# Patient Record
Sex: Female | Born: 1940 | Race: White | Hispanic: No | Marital: Married | Smoking: Former smoker
Health system: Southern US, Community
[De-identification: ages and names within clinical notes are randomized; demographics above are authoritative.]

## PROBLEM LIST (undated history)

## (undated) DIAGNOSIS — M109 Gout, unspecified: Secondary | ICD-10-CM

## (undated) DIAGNOSIS — IMO0001 Reserved for inherently not codable concepts without codable children: Secondary | ICD-10-CM

## (undated) DIAGNOSIS — I10 Essential (primary) hypertension: Secondary | ICD-10-CM

## (undated) DIAGNOSIS — M5430 Sciatica, unspecified side: Secondary | ICD-10-CM

## (undated) DIAGNOSIS — E1165 Type 2 diabetes mellitus with hyperglycemia: Secondary | ICD-10-CM

## (undated) DIAGNOSIS — Z0000 Encounter for general adult medical examination without abnormal findings: Secondary | ICD-10-CM

## (undated) DIAGNOSIS — M25519 Pain in unspecified shoulder: Secondary | ICD-10-CM

## (undated) DIAGNOSIS — M5126 Other intervertebral disc displacement, lumbar region: Secondary | ICD-10-CM

## (undated) DIAGNOSIS — I6529 Occlusion and stenosis of unspecified carotid artery: Secondary | ICD-10-CM

## (undated) DIAGNOSIS — L259 Unspecified contact dermatitis, unspecified cause: Secondary | ICD-10-CM

## (undated) DIAGNOSIS — I872 Venous insufficiency (chronic) (peripheral): Secondary | ICD-10-CM

## (undated) DIAGNOSIS — E785 Hyperlipidemia, unspecified: Secondary | ICD-10-CM

## (undated) DIAGNOSIS — R413 Other amnesia: Secondary | ICD-10-CM

## (undated) DIAGNOSIS — Z83518 Family history of other specified eye disorder: Secondary | ICD-10-CM

## (undated) DIAGNOSIS — M79609 Pain in unspecified limb: Secondary | ICD-10-CM

## (undated) HISTORY — DX: Essential (primary) hypertension: I10

## (undated) HISTORY — DX: Venous insufficiency (chronic) (peripheral): I87.2

## (undated) HISTORY — DX: Type 2 diabetes mellitus with hyperglycemia: E11.65

## (undated) HISTORY — DX: Sciatica, unspecified side: M54.30

## (undated) HISTORY — DX: Unspecified contact dermatitis, unspecified cause: L25.9

## (undated) HISTORY — DX: Other intervertebral disc displacement, lumbar region: M51.26

## (undated) HISTORY — DX: Pain in unspecified limb: M79.609

## (undated) HISTORY — PX: TUBAL LIGATION: SHX77

## (undated) HISTORY — DX: Gout, unspecified: M10.9

## (undated) HISTORY — DX: Reserved for inherently not codable concepts without codable children: IMO0001

## (undated) HISTORY — DX: Hyperlipidemia, unspecified: E78.5

## (undated) HISTORY — DX: Occlusion and stenosis of unspecified carotid artery: I65.29

## (undated) HISTORY — DX: Encounter for general adult medical examination without abnormal findings: Z00.00

## (undated) HISTORY — DX: Other amnesia: R41.3

## (undated) HISTORY — PX: LAPAROSCOPIC APPENDECTOMY: SUR753

## (undated) HISTORY — DX: Pain in unspecified shoulder: M25.519

---

## 1998-04-26 ENCOUNTER — Ambulatory Visit (HOSPITAL_COMMUNITY): Admission: RE | Admit: 1998-04-26 | Discharge: 1998-04-26 | Payer: Self-pay | Admitting: Internal Medicine

## 1998-04-26 ENCOUNTER — Encounter: Payer: Self-pay | Admitting: Internal Medicine

## 2000-07-06 ENCOUNTER — Other Ambulatory Visit: Admission: RE | Admit: 2000-07-06 | Discharge: 2000-07-06 | Payer: Self-pay | Admitting: Obstetrics and Gynecology

## 2003-03-19 ENCOUNTER — Ambulatory Visit (HOSPITAL_COMMUNITY): Admission: RE | Admit: 2003-03-19 | Discharge: 2003-03-19 | Payer: Self-pay | Admitting: Internal Medicine

## 2004-09-22 ENCOUNTER — Ambulatory Visit: Payer: Self-pay | Admitting: Internal Medicine

## 2004-10-06 ENCOUNTER — Ambulatory Visit: Payer: Self-pay | Admitting: Internal Medicine

## 2004-10-08 ENCOUNTER — Ambulatory Visit: Payer: Self-pay | Admitting: Family Medicine

## 2004-10-13 ENCOUNTER — Ambulatory Visit: Payer: Self-pay | Admitting: Internal Medicine

## 2004-11-14 ENCOUNTER — Ambulatory Visit: Payer: Self-pay | Admitting: Internal Medicine

## 2004-12-28 ENCOUNTER — Ambulatory Visit: Payer: Self-pay | Admitting: Internal Medicine

## 2004-12-29 ENCOUNTER — Inpatient Hospital Stay (HOSPITAL_COMMUNITY): Admission: AD | Admit: 2004-12-29 | Discharge: 2005-01-02 | Payer: Self-pay | Admitting: Internal Medicine

## 2004-12-29 ENCOUNTER — Encounter: Admission: RE | Admit: 2004-12-29 | Discharge: 2004-12-29 | Payer: Self-pay | Admitting: Internal Medicine

## 2004-12-29 ENCOUNTER — Encounter (INDEPENDENT_AMBULATORY_CARE_PROVIDER_SITE_OTHER): Payer: Self-pay | Admitting: Specialist

## 2004-12-29 ENCOUNTER — Ambulatory Visit: Payer: Self-pay | Admitting: Internal Medicine

## 2005-02-01 ENCOUNTER — Ambulatory Visit: Payer: Self-pay | Admitting: Internal Medicine

## 2005-08-24 ENCOUNTER — Ambulatory Visit: Payer: Self-pay | Admitting: Internal Medicine

## 2005-11-07 ENCOUNTER — Ambulatory Visit: Payer: Self-pay | Admitting: Internal Medicine

## 2005-11-14 ENCOUNTER — Ambulatory Visit: Payer: Self-pay | Admitting: Internal Medicine

## 2005-11-30 ENCOUNTER — Ambulatory Visit: Payer: Self-pay

## 2006-03-16 ENCOUNTER — Ambulatory Visit: Payer: Self-pay | Admitting: Internal Medicine

## 2006-03-31 ENCOUNTER — Emergency Department (HOSPITAL_COMMUNITY): Admission: EM | Admit: 2006-03-31 | Discharge: 2006-03-31 | Payer: Self-pay | Admitting: Emergency Medicine

## 2006-04-07 ENCOUNTER — Ambulatory Visit: Payer: Self-pay | Admitting: Internal Medicine

## 2006-05-01 ENCOUNTER — Ambulatory Visit: Payer: Self-pay | Admitting: Internal Medicine

## 2006-09-13 ENCOUNTER — Ambulatory Visit: Payer: Self-pay | Admitting: Internal Medicine

## 2006-11-14 ENCOUNTER — Ambulatory Visit: Payer: Self-pay | Admitting: Internal Medicine

## 2006-11-14 LAB — CONVERTED CEMR LAB
Calcium: 9.5 mg/dL (ref 8.4–10.5)
GFR calc Af Amer: 107 mL/min
Glucose, Bld: 118 mg/dL — ABNORMAL HIGH (ref 70–99)
HDL: 30.8 mg/dL — ABNORMAL LOW (ref >39.0)
LDL Cholesterol: 106 mg/dL — ABNORMAL HIGH (ref 0–99)
Sodium: 145 meq/L (ref 135–145)
Total CHOL/HDL Ratio: 5.7
Triglycerides: 191 mg/dL — ABNORMAL HIGH (ref 0–149)

## 2006-12-04 ENCOUNTER — Ambulatory Visit: Payer: Self-pay

## 2007-02-16 ENCOUNTER — Encounter: Payer: Self-pay | Admitting: *Deleted

## 2007-02-16 DIAGNOSIS — Z9889 Other specified postprocedural states: Secondary | ICD-10-CM | POA: Insufficient documentation

## 2007-02-16 DIAGNOSIS — I10 Essential (primary) hypertension: Secondary | ICD-10-CM | POA: Insufficient documentation

## 2007-02-16 DIAGNOSIS — L259 Unspecified contact dermatitis, unspecified cause: Secondary | ICD-10-CM | POA: Insufficient documentation

## 2007-02-16 DIAGNOSIS — M109 Gout, unspecified: Secondary | ICD-10-CM | POA: Insufficient documentation

## 2007-02-16 DIAGNOSIS — E785 Hyperlipidemia, unspecified: Secondary | ICD-10-CM | POA: Insufficient documentation

## 2007-02-27 ENCOUNTER — Ambulatory Visit: Payer: Self-pay | Admitting: Internal Medicine

## 2007-05-22 ENCOUNTER — Emergency Department (HOSPITAL_COMMUNITY): Admission: EM | Admit: 2007-05-22 | Discharge: 2007-05-22 | Payer: Self-pay | Admitting: Family Medicine

## 2007-12-12 ENCOUNTER — Ambulatory Visit: Payer: Self-pay

## 2007-12-12 ENCOUNTER — Encounter: Payer: Self-pay | Admitting: Internal Medicine

## 2007-12-20 ENCOUNTER — Ambulatory Visit: Payer: Self-pay | Admitting: Internal Medicine

## 2007-12-20 DIAGNOSIS — T887XXA Unspecified adverse effect of drug or medicament, initial encounter: Secondary | ICD-10-CM | POA: Insufficient documentation

## 2007-12-20 DIAGNOSIS — M25519 Pain in unspecified shoulder: Secondary | ICD-10-CM | POA: Insufficient documentation

## 2007-12-20 LAB — CONVERTED CEMR LAB
BUN: 15 mg/dL (ref 6–23)
Basophils Absolute: 0.1 10*3/uL (ref 0.0–0.1)
Basophils Relative: 1.7 % (ref 0.0–3.0)
Calcium: 10 mg/dL (ref 8.4–10.5)
Chloride: 101 meq/L (ref 96–112)
Cholesterol: 155 mg/dL (ref 0–200)
Creatinine, Ser: 0.7 mg/dL (ref 0.4–1.2)
Glucose, Bld: 118 mg/dL — ABNORMAL HIGH (ref 70–99)
HCT: 44.9 % (ref 36.0–46.0)
HDL: 32.4 mg/dL — ABNORMAL LOW (ref >39.0)
Hemoglobin: 15.6 g/dL — ABNORMAL HIGH (ref 12.0–15.0)
LDL Cholesterol: 90 mg/dL (ref 0–99)
Lymphocytes Relative: 37.9 % (ref 12.0–46.0)
Monocytes Absolute: 0.8 10*3/uL (ref 0.1–1.0)
Neutrophils Relative %: 44.9 % (ref 43.0–77.0)
Platelets: 339 10*3/uL (ref 150–400)
Potassium: 4.1 meq/L (ref 3.5–5.1)
RBC: 5 M/uL (ref 3.87–5.11)
RDW: 12.4 % (ref 11.5–14.6)
Total CHOL/HDL Ratio: 4.8
Triglycerides: 163 mg/dL — ABNORMAL HIGH (ref 0–149)
VLDL: 33 mg/dL (ref 0–40)
WBC: 8 10*3/uL (ref 4.5–10.5)

## 2007-12-21 ENCOUNTER — Encounter: Payer: Self-pay | Admitting: Internal Medicine

## 2007-12-26 ENCOUNTER — Encounter: Payer: Self-pay | Admitting: Internal Medicine

## 2008-01-07 ENCOUNTER — Ambulatory Visit: Payer: Self-pay | Admitting: Internal Medicine

## 2008-01-07 ENCOUNTER — Encounter (INDEPENDENT_AMBULATORY_CARE_PROVIDER_SITE_OTHER): Payer: Self-pay

## 2008-01-21 ENCOUNTER — Ambulatory Visit: Payer: Self-pay | Admitting: Internal Medicine

## 2008-02-25 ENCOUNTER — Encounter: Payer: Self-pay | Admitting: Internal Medicine

## 2008-05-04 ENCOUNTER — Ambulatory Visit: Payer: Self-pay | Admitting: Internal Medicine

## 2008-05-04 DIAGNOSIS — M79609 Pain in unspecified limb: Secondary | ICD-10-CM | POA: Insufficient documentation

## 2008-05-05 ENCOUNTER — Ambulatory Visit: Payer: Self-pay | Admitting: Internal Medicine

## 2008-05-06 ENCOUNTER — Telehealth: Payer: Self-pay | Admitting: Internal Medicine

## 2008-05-26 ENCOUNTER — Telehealth: Payer: Self-pay | Admitting: Internal Medicine

## 2008-06-16 ENCOUNTER — Encounter: Payer: Self-pay | Admitting: Internal Medicine

## 2008-06-17 ENCOUNTER — Ambulatory Visit: Payer: Self-pay | Admitting: Internal Medicine

## 2008-07-02 ENCOUNTER — Encounter: Admission: RE | Admit: 2008-07-02 | Discharge: 2008-07-02 | Payer: Self-pay | Admitting: Internal Medicine

## 2008-07-06 ENCOUNTER — Telehealth (INDEPENDENT_AMBULATORY_CARE_PROVIDER_SITE_OTHER): Payer: Self-pay | Admitting: *Deleted

## 2008-07-08 ENCOUNTER — Ambulatory Visit: Payer: Self-pay | Admitting: Internal Medicine

## 2008-07-08 DIAGNOSIS — M5430 Sciatica, unspecified side: Secondary | ICD-10-CM | POA: Insufficient documentation

## 2008-07-10 ENCOUNTER — Encounter: Payer: Self-pay | Admitting: Internal Medicine

## 2008-07-16 ENCOUNTER — Encounter: Payer: Self-pay | Admitting: Internal Medicine

## 2008-07-20 HISTORY — PX: LUMBAR LAMINECTOMY: SHX95

## 2008-07-29 ENCOUNTER — Inpatient Hospital Stay (HOSPITAL_COMMUNITY): Admission: RE | Admit: 2008-07-29 | Discharge: 2008-08-03 | Payer: Self-pay | Admitting: Neurosurgery

## 2008-07-30 ENCOUNTER — Ambulatory Visit: Payer: Self-pay | Admitting: Physical Medicine & Rehabilitation

## 2008-08-27 ENCOUNTER — Encounter: Payer: Self-pay | Admitting: Internal Medicine

## 2008-09-07 ENCOUNTER — Encounter: Admission: RE | Admit: 2008-09-07 | Discharge: 2008-10-20 | Payer: Self-pay | Admitting: Neurosurgery

## 2008-10-22 ENCOUNTER — Encounter: Payer: Self-pay | Admitting: Internal Medicine

## 2008-12-16 ENCOUNTER — Ambulatory Visit: Payer: Self-pay

## 2008-12-16 ENCOUNTER — Encounter: Payer: Self-pay | Admitting: Internal Medicine

## 2008-12-29 ENCOUNTER — Ambulatory Visit: Payer: Self-pay | Admitting: Internal Medicine

## 2008-12-29 DIAGNOSIS — M5126 Other intervertebral disc displacement, lumbar region: Secondary | ICD-10-CM | POA: Insufficient documentation

## 2008-12-29 LAB — CONVERTED CEMR LAB
Albumin: 3.8 g/dL (ref 3.5–5.2)
Alkaline Phosphatase: 86 units/L (ref 39–117)
Basophils Absolute: 0 10*3/uL (ref 0.0–0.1)
Bilirubin Urine: NEGATIVE
CO2: 32 meq/L (ref 19–32)
Chloride: 101 meq/L (ref 96–112)
Direct LDL: 99 mg/dL
Eosinophils Absolute: 0.3 10*3/uL (ref 0.0–0.7)
GFR calc non Af Amer: 105.3 mL/min (ref >60)
HDL: 32.1 mg/dL — ABNORMAL LOW (ref >39.00)
Hemoglobin: 13.8 g/dL (ref 12.0–15.0)
Ketones, ur: NEGATIVE mg/dL
Lymphs Abs: 2.9 10*3/uL (ref 0.7–4.0)
MCHC: 34.1 g/dL (ref 30.0–36.0)
MCV: 86 fL (ref 78.0–100.0)
Monocytes Absolute: 0.8 10*3/uL (ref 0.1–1.0)
Monocytes Relative: 9.7 % (ref 3.0–12.0)
Neutrophils Relative %: 49.4 % (ref 43.0–77.0)
Nitrite: NEGATIVE
RBC: 4.71 M/uL (ref 3.87–5.11)
RDW: 13.7 % (ref 11.5–14.6)
Sodium: 142 meq/L (ref 135–145)
Specific Gravity, Urine: 1.02 (ref 1.000–1.030)
Total Bilirubin: 1.3 mg/dL — ABNORMAL HIGH (ref 0.3–1.2)
Total CHOL/HDL Ratio: 5
Urine Glucose: 100 mg/dL
Urobilinogen, UA: 0.2 (ref 0.0–1.0)
WBC: 7.8 10*3/uL (ref 4.5–10.5)
pH: 6 (ref 5.0–8.0)

## 2009-01-11 ENCOUNTER — Telehealth: Payer: Self-pay | Admitting: Internal Medicine

## 2009-01-12 ENCOUNTER — Ambulatory Visit: Payer: Self-pay | Admitting: Internal Medicine

## 2009-01-18 ENCOUNTER — Telehealth: Payer: Self-pay | Admitting: Internal Medicine

## 2009-03-01 ENCOUNTER — Encounter: Payer: Self-pay | Admitting: Internal Medicine

## 2009-05-24 ENCOUNTER — Encounter: Payer: Self-pay | Admitting: Internal Medicine

## 2009-06-22 ENCOUNTER — Encounter: Payer: Self-pay | Admitting: Internal Medicine

## 2009-06-22 LAB — HM MAMMOGRAPHY: HM Mammogram: NORMAL

## 2009-06-28 ENCOUNTER — Telehealth (INDEPENDENT_AMBULATORY_CARE_PROVIDER_SITE_OTHER): Payer: Self-pay | Admitting: *Deleted

## 2009-07-02 ENCOUNTER — Telehealth: Payer: Self-pay | Admitting: Internal Medicine

## 2009-07-06 ENCOUNTER — Encounter: Payer: Self-pay | Admitting: Internal Medicine

## 2009-11-15 ENCOUNTER — Ambulatory Visit: Payer: Self-pay | Admitting: Internal Medicine

## 2009-11-15 DIAGNOSIS — I872 Venous insufficiency (chronic) (peripheral): Secondary | ICD-10-CM | POA: Insufficient documentation

## 2009-12-15 ENCOUNTER — Encounter: Payer: Self-pay | Admitting: Internal Medicine

## 2009-12-15 DIAGNOSIS — I6529 Occlusion and stenosis of unspecified carotid artery: Secondary | ICD-10-CM | POA: Insufficient documentation

## 2009-12-16 ENCOUNTER — Encounter: Payer: Self-pay | Admitting: Internal Medicine

## 2009-12-16 ENCOUNTER — Ambulatory Visit: Payer: Self-pay

## 2009-12-31 ENCOUNTER — Ambulatory Visit: Payer: Self-pay | Admitting: Internal Medicine

## 2009-12-31 ENCOUNTER — Encounter: Payer: Self-pay | Admitting: Internal Medicine

## 2009-12-31 LAB — CONVERTED CEMR LAB
Albumin: 3.9 g/dL (ref 3.5–5.2)
Alkaline Phosphatase: 67 units/L (ref 39–117)
BUN: 17 mg/dL (ref 6–23)
Bilirubin, Direct: 0.2 mg/dL (ref 0.0–0.3)
CO2: 33 meq/L — ABNORMAL HIGH (ref 19–32)
Creatinine, Ser: 0.7 mg/dL (ref 0.4–1.2)
Eosinophils Relative: 3.6 % (ref 0.0–5.0)
GFR calc non Af Amer: 92.44 mL/min (ref >60)
Glucose, Bld: 146 mg/dL — ABNORMAL HIGH (ref 70–99)
HCT: 41.5 % (ref 36.0–46.0)
Lymphocytes Relative: 38.9 % (ref 12.0–46.0)
MCHC: 35.1 g/dL (ref 30.0–36.0)
Magnesium: 2 mg/dL (ref 1.5–2.5)
Monocytes Absolute: 0.8 10*3/uL (ref 0.1–1.0)
Neutrophils Relative %: 48.1 % (ref 43.0–77.0)
Platelets: 320 10*3/uL (ref 150.0–400.0)
Potassium: 4.2 meq/L (ref 3.5–5.1)
Total Bilirubin: 1.1 mg/dL (ref 0.3–1.2)
Total CHOL/HDL Ratio: 5
Triglycerides: 289 mg/dL — ABNORMAL HIGH (ref 0.0–149.0)
VLDL: 57.8 mg/dL — ABNORMAL HIGH (ref 0.0–40.0)

## 2010-06-21 NOTE — Miscellaneous (Signed)
Summary: Bone Density update   Clinical Lists Changes  Observations: Added new observation of BONE DENSITY: Osteopenia (06/22/2009 15:01)        Preventive Care Screening  Bone Density:    Date:  06/22/2009    Results:  Osteopenia

## 2010-06-21 NOTE — Progress Notes (Signed)
Summary: RESULTS  Phone Note Call from Patient   Summary of Call: Patient is requesting results of bone density test. Have you seen this report?  Initial call taken by: Lamar Sprinkles, CMA,  July 02, 2009 1:42 PM  Follow-up for Phone Call        no report in EMR Follow-up by: Jacques Navy MD,  July 02, 2009 6:00 PM

## 2010-06-21 NOTE — Assessment & Plan Note (Signed)
Summary: YEARLY F/U MEDICARE/#/CD   Vital Signs:  Patient profile:   70 year old female Height:      63 inches (160.02 cm) Weight:      168 pounds (76.36 kg) BMI:     29.87 O2 Sat:      95 % on Room air Temp:     97.6 degrees F (36.44 degrees C) oral Pulse rate:   70 / minute BP sitting:   130 / 80  (left arm) Cuff size:   regular  Vitals Entered By: Lucious Groves CMA (December 31, 2009 9:45 AM)  O2 Flow:  Room air CC: Yearly f/u./kb Is Patient Diabetic? No Pain Assessment Patient in pain? no      Comments Patient notes that no refills are needed at this time./kb   Primary Care Provider:  Jacques Navy MD  CC:  Yearly f/u./kb.  History of Present Illness: Patient presents for annual exam. She has returned to the gym. She is having some left low back pain with walking. She feels like there is something crawling in her left ear. She is otherwise doing well. she was recently seen for swelling in the distal LE and this is better with elevation of the legs.   Preventive Screening-Counseling & Management  Alcohol-Tobacco     Alcohol drinks/day: 0     Smoking Status: quit     Year Quit: 1997     Pack years: 30  Caffeine-Diet-Exercise     Does Patient Exercise: yes     [facility] Depression Score: no evidence of depress  Hep-HIV-STD-Contraception     Dental Visit-last 6 months yes  Safety-Violence-Falls     Seat Belt Use: yes     Fall Risk: no fall risk      Sexual History:  currently monogamous.        Drug Use:  never.        Blood Transfusions:  no.    Current Medications (verified): 1)  Propranolol-Hctz 40-25 Mg  Tabs (Propranolol-Hctz) .... Take One Tablet Once Daily 2)  Zocor 20 Mg  Tabs (Simvastatin) .... Take One Tablet Once Daily 3)  Centrum Silver  Tabs (Multiple Vitamins-Minerals) .Marland Kitchen.. 1 Tab Daily  Allergies (verified): 1)  ! Penicillin 2)  ! Ampicillin 3)  ! 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 Flo Shanks - Dr. Vena Rua - Dr. 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 Live s with husband in own home. End-of-Life: she does not want to be kept alive on machines. Needs more information in regard to CPR, Etc. Provided with [employer] Guide and MOST (7/09)  Risk Factors: Alcohol  Use: 0 (12/31/2009) Caffeine Use: 0 (12/29/2008) Diet: heart healthy (12/29/2008) Exercise: yes (12/31/2009)  Risk Factors: Smoking Status: quit (12/31/2009)  Social History: Smoking Status:  quit Dental Care w/in 6 mos.:  yes Seat Belt Use:  yes Fall Risk:  no fall risk Sexual History:  currently monogamous Drug Use:  never Blood Transfusions:  no  Review of Systems  The patient denies anorexia, fever, weight loss, weight gain, vision loss, hoarseness, chest pain, dyspnea on exertion, peripheral edema, headaches, abdominal pain, hematochezia, hematuria, genital sores, muscle weakness, suspicious skin lesions, difficulty walking, depression, and enlarged lymph nodes.    Physical Exam  General:  Well-developed,well-nourished,in no acute distress; alert,appropriate and  cooperative throughout examination Head:  Normocephalic and atraumatic without obvious abnormalities. No apparent alopecia or balding. Eyes:  vision grossly intact, pupils equal, pupils round, and corneas and lenses clear.   Ears:  External ear exam shows no significant lesions or deformities.  Otoscopic examination reveals clear canals, tympanic membranes are intact bilaterally without bulging, retraction, inflammation or discharge. Hearing is grossly normal bilaterally. Nose:  no external deformity and no external erythema.   Mouth:  Oral mucosa and oropharynx without lesions or exudates.  Teeth in good repair. Neck:  supple, full ROM, no thyromegaly, and no carotid bruits.   Chest Wall:  no deformities.   Breasts:  deferred to gyn Lungs:  Normal respiratory effort, chest expands symmetrically. Lungs are clear to auscultation, no crackles or wheezes. Heart:  normal rate, regular rhythm, no murmur, and no JVD.   Abdomen:  soft, non-tender, normal bowel sounds, no masses, no guarding, no rigidity, and no hepatomegaly.   Genitalia:  deferred to gyn Msk:  normal ROM, no joint tenderness, no joint swelling, no joint warmth, no redness over joints, and no joint deformities.   Pulses:  2+ radial and DP pulses Extremities:  1+ non-pitting pedal edema. Neurologic:  alert & oriented X3, cranial nerves II-XII intact, strength normal in all extremities, sensation intact to light touch, gait normal, and DTRs symmetrical and normal.   Skin:  turgor normal and color normal. Healing dog bite at base of great toe-nail right foot. No ulcerations   Cervical Nodes:  no anterior cervical adenopathy and no posterior cervical adenopathy.   Psych:  Oriented X3, memory intact for recent and remote, normally interactive, and good eye contact.     Impression & Recommendations:  Problem # 1:  CAROTID ARTERY DISEASE (ICD-433.10) Reviewed recent carotid doppler: 0-39% RICA, 30-59% LICA. Recommendation was for follow-up  in 1 year.   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 She is active without limitations.   Problem # 4:  GOUT (ICD-274.9) No flares of gout recently.   Plan - check uric acid level - if elevated may be a candidate for prophylaxis  Orders: TLB-Uric Acid, Blood (84550-URIC)  Addendum - Uric Acid = 6.0 normal range. No indication for medical treatment  Problem # 5:  HYPERLIPIDEMIA (ICD-272.4) For routine lab with recommendations to follow  Her updated medication list for this problem includes:    Zocor 20 Mg Tabs (Simvastatin) .Marland Kitchen... Take one tablet once daily  Orders: TLB-Lipid Panel (80061-LIPID) TLB-Hepatic/Liver Function Pnl (80076-HEPATIC)  Addendum- excellent control of lipids but HDL remains low. She has minimal elevation of AST/ALT but not to the level of needing a change in  medication.  Problem # 6:  HYPERTENSION (ICD-401.9)  Her updated medication list for this problem includes:    Propranolol-hctz 40-25 Mg Tabs (Propranolol-hctz) .Marland Kitchen... Take one tablet once daily  Orders: TLB-BMP (Basic Metabolic Panel-BMET) (80048-METABOL) TLB-Magnesium (Mg) (83735-MG)  BP today: 130/80 Prior BP: 152/88 (11/15/2009)  Adequate control. Will check routine lab today.  Plan - continue present medications.   Problem # 7:  Preventive Health Care (ICD-V70.0) Patient with unremarkable interval history. Her exam is normal. Labs are pending. She is current with Gyn, mammography, bone density - reviewed study: osteopenia with T-score of -1.7 left femoral neck, -1.4 right femoral neck. Needs to continue with adequate calcium intake, vitamin D [phone] international units daily. She is current with tetnus. She is a candidate for pneumonia vaccine and shingles vaccine and will discuss at next  visit.  Patient has no evidence of depression. She is independent in all ADLs with no fall or injury risk.  In summary - a delightful woman who is medically stable. She will return in 1 year or as needed.   Complete Medication List: 1)  Propranolol-hctz 40-25 Mg Tabs (Propranolol-hctz) .... Take one tablet once daily 2)  Zocor 20 Mg Tabs (Simvastatin) .... Take one tablet once daily 3)  Centrum Silver Tabs (Multiple vitamins-minerals) .Marland Kitchen.. 1 tab daily  Other Orders: TLB-CBC Platelet - w/Differential (85025-CBCD) MC -Subsequent Annual Wellness Visit [phone])  Patient: Tobin Chad Note: All result statuses are Final unless otherwise noted.  Tests: (1) Uric Acid (URIC)   Uric Acid                 6.0 mg/dL                   1.4-7.8  Tests: (2) Lipid Panel (LIPID)   Cholesterol               132 mg/dL                   2-956     ATP III Classification            Desirable:  < 200 mg/dL                    Borderline High:  200 - 239 mg/dL               High:  > = 240 mg/dL   Triglycerides        [H]  289.0 mg/dL                 2.1-308.6     Normal:  <150 mg/dL     Borderline High:  578 - 199 mg/dL   HDL                  [L]  46.96 mg/dL                 >29.52   VLDL Cholesterol     [H]  57.8 mg/dL                  8.4-13.2  CHO/HDL Ratio:  CHD Risk                             5                    Men          Women  1/2 Average Risk     3.4          3.3     Average Risk          5.0          4.4     2X Average Risk          9.6          7.1     3X Average Risk          15.0          11.0                           Tests: (3) Hepatic/Liver Function Panel (HEPATIC)   Total Bilirubin           1.1 mg/dL                   9.5-6.2   Direct Bilirubin          0.2 mg/dL                   1.3-0.8   Alkaline Phosphatase      67 U/L                      39-117   AST                  [H]  44 U/L                      0-37   ALT                  [H]  46 U/L                      0-35    Total Protein             6.8 g/dL                    6.5-7.8   Albumin                   3.9 g/dL                    4.6-9.6  Tests: (4) BMP (METABOL)   Sodium                    142 mEq/L                   135-145   Potassium                 4.2 mEq/L                   3.5-5.1   Chloride                  102 mEq/L                   96-112   Carbon Dioxide       [H]  33 mEq/L                    19-32   Glucose              [H]  146 mg/dL  70-99   BUN                       17 mg/dL                    1-61   Creatinine                0.7 mg/dL                   0.9-6.0   Calcium                   9.6 mg/dL                   4.5-40.9   GFR                       92.44 mL/min                >60  Tests: (5) Magnesium (MG)   Magnesium                 2.0 mg/dL                   8.1-1.9  Tests: (6) CBC Platelet w/Diff (CBCD)   White Cell Count          8.9 K/uL                    4.5-10.5   Red Cell Count            4.61 Mil/uL                 3.87-5.11   Hemoglobin                14.6 g/dL                   14.7-82.9   Hematocrit                41.5 %                      36.0-46.0   MCV                       90.0 fl                     78.0-100.0   MCHC                      35.1 g/dL                   56.2-13.0   RDW                       12.9 %                      11.5-14.6   Platelet Count            320.0 K/uL                  150.0-400.0   Neutrophil %              48.1 %                      43.0-77.0   Lymphocyte %  38.9 %                      12.0-46.0   Monocyte %                8.7 %                       3.0-12.0   Eosinophils%              3.6 %                       0.0-5.0   Basophils %               0.7 %                       0.0-3.0   Neutrophill Absolute      4.3 K/uL                    1.4-7.7   Lymphocyte Absolute       3.5 K/uL                    0.7-4.0   Monocyte Absolute         0.8 K/uL                    0.1-1.0  Eosinophils,  Absolute                             0.3 K/uL                    0.0-0.7   Basophils Absolute        0.1 K/uL                    0.0-0.1  Tests: (7) Cholesterol LDL - Direct (DIRLDL)  Cholesterol LDL - Direct                             81.6 mg/dLPrescriptions: ZOCOR 20 MG  TABS (SIMVASTATIN) Take one tablet once daily  #30 Tablet x 12   Entered and Authorized by:   Jacques Navy MD   Signed by:   Jacques Navy MD on 12/31/2009   Method used:   Electronically to        [facility]       [address]       [place], [place]  [address]       Ph: [phone]       Fax: [phone]   RxID:   [id number] PROPRANOLOL-HCTZ 40-25 MG  TABS (PROPRANOLOL-HCTZ) Take one tablet once daily  #30 x 12   Entered and Authorized by:   Jacques Navy MD   Signed by:   Jacques Navy MD on 12/31/2009   Method used:   Electronically to        [facility]       [address]       [place], [place]  [address]       Ph: [phone]       Fax: [phone]   RxID:   [id number]

## 2010-06-21 NOTE — Assessment & Plan Note (Signed)
Summary: R SWOLLEN FEEL LIKE WANT TO CRAMP UP FOOT- DIFFICULT TO MOVE-...   Vital Signs:  Patient profile:   70 year old female Height:      63 inches Weight:      169 pounds BMI:     30.05 O2 Sat:      97 % on Room air Temp:     97.8 degrees F oral Pulse rate:   67 / minute BP sitting:   152 / 88  (left arm) Cuff size:   regular  Vitals Entered By: Bill Salinas CMA (November 15, 2009 2:31 PM)  O2 Flow:  Room air CC: pt here with c/o swelling in both feet/ ab   Primary Care Provider:  Jacques Navy MD  CC:  pt here with c/o swelling in both feet/ ab.  History of Present Illness: Patient presents with a history of progressive swelling of her distal lower extremities. she has not had any free flow of fluid from her legs, no ulceration, no pain.  Arthritis- 72 hour h/o erythema at th great toe right with heat and extreme tenderness. The idscomfort is better today. She has no h/o gout.  Current Medications (verified): 1)  Propranolol-Hctz 40-25 Mg  Tabs (Propranolol-Hctz) .... Take One Tablet Once Daily 2)  Zocor 20 Mg  Tabs (Simvastatin) .... Take One Tablet Once Daily 3)  Centrum Silver  Tabs (Multiple Vitamins-Minerals) .Marland Kitchen.. 1 Tab Daily  Allergies (verified): 1)  ! Penicillin 2)  ! Ampicillin 3)  ! Neosporin PMH-FH-SH reviewed-no changes except otherwise noted  Review of Systems       The patient complains of peripheral edema.  The patient denies anorexia, fever, weight loss, weight gain, vision loss, syncope, dyspnea on exertion, prolonged cough, and abdominal pain.    Physical Exam  General:  Overweight white female in no distress Head:  normocephalic.   Eyes:  corneas and lenses clear and no injection.   Lungs:  normal respiratory effort.   Heart:  normal rate and regular rhythm.   Msk:  right 1st MTP red, tender with movement, slightly swollen Neurologic:  alert & oriented X3, cranial nerves II-XII intact, and gait normal.   Skin:  turgor normal, color normal,  no petechiae, and no ulcerations.     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 No recent uric acid level. Flares are not very often.  Plan - aleve 1or 2 two times a day           Uric acid level at next blood draw.   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  Plan - elevate legs during the day           knee high support hose.   Complete Medication List: 1)  Propranolol-hctz 40-25 Mg Tabs (Propranolol-hctz) .... Take one tablet once daily 2)  Zocor 20 Mg Tabs (Simvastatin) .... Take one tablet once daily 3)  Centrum Silver Tabs (Multiple vitamins-minerals) .Marland Kitchen.. 1 tab daily  Patient Instructions: 1)  Fluid in the distal extremities - peripheral edema: no evidence of kidney or hear trouble. Cause is most likely venous insufficiency. Plan - elevated your legs during the day; over the counter support hosiery. 2)  Sore toe joint - may be gout or pseudogout. Plan - take 1 or 2 aleve every twelve hours until redness, swelling and pain are better. At your next lab draw will add a Uric Acid level to see if you  are at risk for gout or recurrent gout.    Immunization History:  Tetanus/Td Immunization History:    Tetanus/Td:  historical (08/14/2007)  Influenza Immunization History:    Influenza:  declined (11/15/2009)

## 2010-06-21 NOTE — Miscellaneous (Signed)
Summary: Orders Update  Clinical Lists Changes  Problems: Added new problem of CAROTID ARTERY DISEASE (ICD-433.10) Orders: Added new Test order of Carotid Duplex (Carotid Duplex) - Signed 

## 2010-06-21 NOTE — Progress Notes (Signed)
    Preventive Care Screening  Mammogram:    Date:  06/22/2009    Results:  normal bilateral

## 2010-06-27 ENCOUNTER — Encounter: Payer: Self-pay | Admitting: Internal Medicine

## 2010-07-14 ENCOUNTER — Encounter: Payer: Self-pay | Admitting: Internal Medicine

## 2010-07-15 ENCOUNTER — Telehealth: Payer: Self-pay | Admitting: Internal Medicine

## 2010-07-19 NOTE — Progress Notes (Signed)
Summary: ELEVATED CBG  Phone Note Call from Patient   Summary of Call: Dr Ambrose Mantle called and spoke w/Dr Plotnikov. Pt's cbg was 241 at their office. Per Dr Macario Golds - start metformin 500mg  1 once daily and f/u with Dr Debby Bud next week.  Initial call taken by: Lamar Sprinkles, CMA,  July 15, 2010 1:44 PM  Follow-up for Phone Call        Pt informed of rx. When do you want to see pt for f/u?  Follow-up by: Lamar Sprinkles, CMA,  July 15, 2010 6:56 PM  Additional Follow-up for Phone Call Additional follow up Details #1::        Thursday Additional Follow-up by: Jacques Navy MD,  July 15, 2010 10:15 PM    New/Updated Medications: METFORMIN HCL 500 MG TABS (METFORMIN HCL) 1 once daily Prescriptions: METFORMIN HCL 500 MG TABS (METFORMIN HCL) 1 once daily  #30 x 1   Entered by:   Lamar Sprinkles, CMA   Authorized by:   Jacques Navy MD   Signed by:   Lamar Sprinkles, CMA on 07/15/2010   Method used:   Electronically to        [facility]       [address]       [place], [place]  [address]       Ph: [phone]       Fax: [phone]   RxID:   [id number]

## 2010-07-21 ENCOUNTER — Encounter: Payer: Self-pay | Admitting: Internal Medicine

## 2010-07-21 ENCOUNTER — Ambulatory Visit (INDEPENDENT_AMBULATORY_CARE_PROVIDER_SITE_OTHER): Payer: Medicare Other | Admitting: Internal Medicine

## 2010-07-21 ENCOUNTER — Other Ambulatory Visit: Payer: Self-pay | Admitting: Internal Medicine

## 2010-07-21 ENCOUNTER — Other Ambulatory Visit: Payer: Medicare Other

## 2010-07-21 DIAGNOSIS — E1165 Type 2 diabetes mellitus with hyperglycemia: Secondary | ICD-10-CM

## 2010-07-21 DIAGNOSIS — IMO0001 Reserved for inherently not codable concepts without codable children: Secondary | ICD-10-CM

## 2010-07-21 DIAGNOSIS — E1149 Type 2 diabetes mellitus with other diabetic neurological complication: Secondary | ICD-10-CM | POA: Insufficient documentation

## 2010-07-21 LAB — HEMOGLOBIN A1C: Hgb A1c MFr Bld: 11.1 % — ABNORMAL HIGH (ref 4.6–6.5)

## 2010-07-23 ENCOUNTER — Encounter: Payer: Self-pay | Admitting: Internal Medicine

## 2010-07-26 ENCOUNTER — Telehealth: Payer: Self-pay | Admitting: Internal Medicine

## 2010-07-28 NOTE — Assessment & Plan Note (Signed)
Summary: per flag 30 min appt/--see phone note dated 2/24/cd   Vital Signs:  Patient profile:   70 year old female Height:      63 inches (160.02 cm) Weight:      163.75 pounds (74.43 kg) BMI:     29.11 O2 Sat:      95 % on Room air Temp:     98.3 degrees F (36.83 degrees C) oral Pulse rate:   66 / minute Resp:     14 per minute BP sitting:   142 / 80  (left arm) Cuff size:   regular  Vitals Entered By: Burnard Leigh CMA(AAMA) (July 21, 2010 1:14 PM)  O2 Flow:  Room air CC: F/U to discuss CBG and new med/sls,cma   Primary Care Provider:  Jacques Navy MD  CC:  F/U to discuss CBG and new med/sls and cma.  History of Present Illness: [name] presents today due to elevasted serum glucose at her gynecologists office - 241. Her old record is reviewed and she has had a rising serum glucose over the past 3 years. She has been asymptomatic: no polydipsia, polyuria or polyphagia, no blurred vision.  Current Medications (verified): 1)  Propranolol-Hctz 40-25 Mg  Tabs (Propranolol-Hctz) .... Take One Tablet Once Daily 2)  Zocor 20 Mg  Tabs (Simvastatin) .... Take One Tablet Once Daily 3)  Centrum Ultra Womens Tabs (Multiple Vitamins-Minerals) .Marland Kitchen.. 1 Tab Daily 4)  Metformin Hcl 500 Mg Tabs (Metformin Hcl) .Marland Kitchen.. 1 Once Daily  Allergies (verified): 1)  ! Penicillin 2)  ! Ampicillin 3)  ! 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 Flo Shanks - Dr. Vena Rua - Dr. Hyacinth Meeker, OD FH reviewed for relevance, SH/Risk Factors reviewed for relevance  Review of Systems  The patient denies anorexia, fever, weight loss, weight gain, chest pain, dyspnea on exertion, headaches, abdominal pain, severe indigestion/heartburn, muscle weakness, difficulty walking, depression, and enlarged lymph nodes.    Physical Exam  General:  Well-developed,well-nourished,in no acute distress; alert,appropriate and cooperative throughout examination Eyes:  vision grossly intact, pupils equal, and pupils round.   Neck:  supple.   Lungs:  normal respiratory effort and normal breath sounds.   Heart:  normal rate and regular rhythm.   Pulses:  2+ radial Neurologic:  alert & oriented X3, cranial nerves II-XII intact, and gait normal.   Skin:  turgor normal and color normal.   Psych:  Oriented X3, good eye contact, and not anxious appearing.     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 Explained the mechanism of diabetes and the approach to management. Reviewed the long term sequellae. Directed her several  sources of information on life-style management expecially diet and exercise.  Plan - A1C for baseline           continue metformin           referral to diabetic educator/dietician at her descretion.  Her updated medication list for this problem includes:    Metformin Hcl 500 Mg Tabs (Metformin hcl) .Marland Kitchen... 1 once daily  Addendum- Tests: (1) Hemoglobin A1C (A1C)   Hemoglobin A1C       [H]  11.1 %                      4.6-6.5     Glycemic Control Guidelines for People with  Diabetes:     Non Diabetic:  <6%     Goal of Therapy: <7%     Additional Action Suggested:  >8%   Orders: TLB-A1C / Hgb A1C (Glycohemoglobin) (83036-A1C)  (greater than 50% of a 30 min visit spent on education and counseling)  Complete Medication List: 1)  Propranolol-hctz 40-25 Mg Tabs (Propranolol-hctz) .... Take one tablet once daily 2)  Zocor 20 Mg Tabs (Simvastatin) .... Take one tablet once daily 3)  Centrum Ultra Womens Tabs (multiple Vitamins-minerals)  .Marland Kitchen.. 1 tab daily 4)  Metformin Hcl 500 Mg Tabs (Metformin hcl) .Marland Kitchen.. 1 once daily   Orders Added: 1)  TLB-A1C / Hgb A1C (Glycohemoglobin) [83036-A1C] 2)  Est. Patient Level IV [91478]

## 2010-08-02 NOTE — Letter (Signed)
   [facility] [address] [place], [place]  [address] Phone: [phone]      July 25, 2010   [facility] [address] [place], [place] [address]  RE:  LAB RESULTS  Dear  Ms. Rufus,  The following is an interpretation of your most recent lab tests.  Please take note of any instructions provided or changes to medications that have resulted from your lab work.    DIABETIC STUDIES:  Poor - schedule a follow-up appointment soon Blood Glucose: 146   HgbA1C: 11.1     A1C very high as expected. You DO NOT NEED to schedule a soon appointment but do let me know if you wish to meet with a diabetes eduator/dietician. Work hard on [employer] and exercise. Continue the metformin twice a day. An Rx has been sent to your drugstore.  Please come see me if you have any questions about these lab results.   Medications Prescribed or Changed METFORMIN HCL 500 MG TABS (METFORMIN HCL) 1 by mouth two times a day   Sincerely Yours,    Jacques Navy MD  Tests: (1) Hemoglobin A1C (A1C)   Hemoglobin A1C       [H]  11.1 %                      4.6-6.5     Glycemic Control Guidelines for People with Diabetes:     Non Diabetic:  <6%     Goal of Therapy: <7%     Additional Action Suggested:  >8%

## 2010-08-02 NOTE — Letter (Signed)
Summary: [employer] for [facility]   Imported By: Sherian Rein 07/25/2010 10:13:47  _____________________________________________________________________  External Attachment:    Type:   Image     Comment:   External Document

## 2010-08-02 NOTE — Progress Notes (Signed)
Summary: REFERAL  Phone Note Call from Patient Call back at Home Phone [phone]   Caller: Patient Summary of Call: Patient lmovm requesting a referral to a dietician due to diabetes dx.Alvy Beal Archie CMA  July 26, 2010 2:28 PM   Follow-up for Phone Call        OK  order placed with [facility] Follow-up by: Jacques Navy MD,  July 26, 2010 4:46 PM  Additional Follow-up for Phone Call Additional follow up Details #1::        Pt informed  Additional Follow-up by: Lamar Sprinkles, CMA,  July 26, 2010 6:21 PM

## 2010-08-02 NOTE — Letter (Signed)
Summary: [employer] for [facility]   Imported By: Sherian Rein 07/25/2010 10:14:47  _____________________________________________________________________  External Attachment:    Type:   Image     Comment:   External Document

## 2010-08-08 ENCOUNTER — Encounter: Payer: Medicare Other | Attending: Internal Medicine | Admitting: *Deleted

## 2010-08-08 DIAGNOSIS — Z713 Dietary counseling and surveillance: Secondary | ICD-10-CM | POA: Insufficient documentation

## 2010-08-08 DIAGNOSIS — E119 Type 2 diabetes mellitus without complications: Secondary | ICD-10-CM | POA: Insufficient documentation

## 2010-08-17 ENCOUNTER — Telehealth: Payer: Self-pay | Admitting: Internal Medicine

## 2010-08-17 NOTE — Telephone Encounter (Signed)
Pt states that she is having side effects from her diabetes medication [Metformin]. She c/o redness and irritation in mouth that burns if she tries to use toothpaste [not using presently]. Please advise.

## 2010-08-17 NOTE — Telephone Encounter (Signed)
Patient informed. 

## 2010-08-17 NOTE — Telephone Encounter (Signed)
May stop metformin - report back if the symptoms resolve. Will need ov to change medications.

## 2010-08-23 ENCOUNTER — Ambulatory Visit (INDEPENDENT_AMBULATORY_CARE_PROVIDER_SITE_OTHER): Payer: Medicare Other | Admitting: Internal Medicine

## 2010-08-23 ENCOUNTER — Encounter: Payer: Self-pay | Admitting: Internal Medicine

## 2010-08-23 ENCOUNTER — Other Ambulatory Visit (INDEPENDENT_AMBULATORY_CARE_PROVIDER_SITE_OTHER): Payer: Medicare Other

## 2010-08-23 VITALS — BP 128/78 | HR 68 | Temp 98.0°F | Wt 158.0 lb

## 2010-08-23 DIAGNOSIS — G629 Polyneuropathy, unspecified: Secondary | ICD-10-CM | POA: Insufficient documentation

## 2010-08-23 DIAGNOSIS — G609 Hereditary and idiopathic neuropathy, unspecified: Secondary | ICD-10-CM

## 2010-08-23 DIAGNOSIS — IMO0001 Reserved for inherently not codable concepts without codable children: Secondary | ICD-10-CM

## 2010-08-23 NOTE — Progress Notes (Signed)
  Subjective:    Patient ID: [name], female    DOB: [birth year], 70 y.o.   MRN: [id number]  HPI Mrs. 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 She had called last week to report that her mouth and lips had turned bright red and became very sensitive. She was instructed to stop metformin as possible cause. She reports that her mouth was better the next day and her symptoms have not returned. She does note that she had eaten a lot of strawberries prior to her symptoms developing.  She is concerned that she may have a peripheral neuropathy. Her feet have been feeling "funny."    Review of Systems  Constitutional: Negative.  Negative for fever, chills and activity change.  HENT: Negative for facial swelling, rhinorrhea, mouth sores, neck stiffness and dental problem.   Eyes: Negative.   Respiratory: Negative.   Cardiovascular: Negative.   Neurological: Negative.        Objective:   Physical Exam  Constitutional: She is oriented to person, place, and time. She appears well-developed and well-nourished. No distress.  HENT:  Head: Normocephalic and atraumatic.  Mouth/Throat: Uvula is midline and mucous membranes are normal. No oral lesions. No lacerations. No posterior oropharyngeal erythema.  Eyes: Conjunctivae and EOM are normal. Pupils are equal, round, and reactive to light.  Cardiovascular: Normal rate and regular rhythm.   Pulmonary/Chest: Effort normal and breath sounds normal.  Neurological: She is alert and oriented to person, place, and time.       Decrease sensation in he distal LE to light touch, pin prick and to deep vibratory sensation.  Psychiatric: She has a normal mood and affect. Her behavior is normal.          Assessment & Plan:  1. Diabetes - inflammation/redness of the mucous membranes is not a commonly seen reaction to metformin. This may have been a food reaction.  Plan - rechallenge with metformin - call if there are problems  2.  Peripheral neuropathy - on exam loss of sensation is noted. She has not had long-standing diabetes although serum glucose has been elevated for several years. May be diabetes vs B12 deficiency vs other.  Plan - B12 level today.

## 2010-08-28 ENCOUNTER — Telehealth: Payer: Self-pay | Admitting: Internal Medicine

## 2010-08-28 NOTE — Telephone Encounter (Signed)
Please call patient - B12 is normal and not the cause of peripheral tingling  Thanks

## 2010-08-31 NOTE — Telephone Encounter (Signed)
Informed pt of result

## 2010-09-01 LAB — CBC
HCT: 43.4 % (ref 36.0–46.0)
MCV: 89.7 fL (ref 78.0–100.0)
RBC: 4.84 MIL/uL (ref 3.87–5.11)

## 2010-09-01 LAB — TYPE AND SCREEN: Antibody Screen: NEGATIVE

## 2010-09-01 LAB — BASIC METABOLIC PANEL
BUN: 11 mg/dL (ref 6–23)
Creatinine, Ser: 0.78 mg/dL (ref 0.4–1.2)
GFR calc Af Amer: >60 mL/min (ref >60)
Potassium: 4.5 mEq/L (ref 3.5–5.1)
Sodium: 136 mEq/L (ref 135–145)

## 2010-09-20 ENCOUNTER — Encounter: Payer: Medicare Other | Attending: Internal Medicine | Admitting: *Deleted

## 2010-09-20 DIAGNOSIS — Z713 Dietary counseling and surveillance: Secondary | ICD-10-CM | POA: Insufficient documentation

## 2010-09-20 DIAGNOSIS — E119 Type 2 diabetes mellitus without complications: Secondary | ICD-10-CM | POA: Insufficient documentation

## 2010-10-04 NOTE — Assessment & Plan Note (Signed)
[facility]                           PRIMARY CARE OFFICE NOTE   LOYD, SALVADOR                        MRN:          [id number]  DATE:11/14/2006                            DOB:          June 28, 1940    Ms. [name] is a 70 year old woman followed for hyperlipidemia, mild  hypertension, and GERD.  She was last seen in the office September 13, 2006  for an upper respiratory infection and cough.  At this time patient  reports that she is feeling well and doing well.   PAST MEDICAL HISTORY:  Well documented in my note of November 14, 2005  including family history and social history with no significant changes.   CURRENT MEDICATIONS:  1. Propranolol/hydrochlorothiazide 40/25 one daily.  2. Zocor 20 mg daily.  3. Zegerid 40 mg daily on a p.r.n. basis.   CHIEF COMPLAINT:  The patient has continued discomfort at the medial  aspect of her left knee which does not limit her activities.  She has  occasional pain in her foot.   REVIEW OF SYSTEMS:  Patient has had no fevers, sweats, chills, or other  constitutional symptoms.  She had an eye exam in May which was  unremarkable.  She has seen Dr. Annalee Genta in the past because of  abnormal CT scan showing a fullness and asymmetry in the right  hypopharynx.  He felt there was no abnormality noted, and the film was  over read.  Patient with no cardiovascular, respiratory complaints.  She  has occasional heartburn relieved with Zegerid.  No change in her bowel  habits.  No GE complaints.  Musculoskeletal as above.  No dermatologic  or neurologic complaints.   EXAMINATION:  Temperature was 97.2.  Blood pressure 131/81.  Pulse 79.  Weight 170.  GENERAL APPEARANCE:  A well-nourished, well-developed woman in no acute  distress.  HEENT:  Exam was unremarkable.  NECK:  Supple without thyromegaly.  NODES:  No adenopathy was noted in the cervical, supraclavicular, or  axillary regions.  CHEST:  No CVA tenderness.  No  deformities were noted.  LUNGS:  Clear with no rales, wheezes, or rhonchi.  CARDIOVASCULAR:  Two plus radial pulses.  No JVD.  No carotid bruits.  She had a quiet precordium with a regular rate and rhythm without  murmurs, rubs, or gallops.  BREAST EXAM:  Had been deferred to Dr. Ambrose Mantle.  ABDOMEN:  Soft.  No guarding.  No rebound.  No organosplenomegaly was  appreciated.  PELVIC AND RECTAL:  Exams deferred to Dr. Ambrose Mantle.  EXTREMITIES:  Without clubbing, cyanosis, or edema.  No deformities were  noted.  LABORATORY:  SGOT was normal at 37.  SGPT was slightly elevated at 44.  Chemistries revealed a serum glucose of 118.  Electrolytes were normal.  Kidney function normal with a creatinine of 0.7.  GFR of 89.  Cholesterol was 175, triglycerides 191, HDL was slightly low at 30.8,  LDL was well controlled at 106.   ASSESSMENT AND PLAN:  1. Hypertension.  Patient's blood pressure is adequately controlled on      her present  medical regimen, and refill prescriptions are provided.  2. Lipids.  Patient has a slightly depressed HDL and excellent LDL      cholesterol.  I encouraged her to increase her exercise to help      bring her HDL up, and is also to follow a low-carb diet.  3. Gastrointestinal.  Patient doing well with good control of her      intermittent dyspepsia with Zegerid.  4. Gout.  Patient has had no flares of gout recently, and is doing      well without prophylactic medication.  5. Health maintenance.  Patient is current and up to date with her      gynecologist.  Patient has not had colonoscopy.  I once again      discussed this with her, encouraging her to consider scheduling      routine screening with colonoscopy.  I stressed the preventative      nature of this study.  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  On exam      she did have a very faint carotid bruit, left greater than right.      Patient is set  up for study December 04, 2006 at 8 o'clock in the      morning at [facility], and she is aware of this      appointment.   We discussed the patient's CT scan from November 2007, and decided to  have a followup study in November 2008 in regards to the asymmetry in  the right hypopharynx.   This generally is a very pleasant patient who is medically stable at  this time.  She is asked to return to see me on a p.r.n. basis or in 1  year.  She needs to call in November to be scheduled for followup CT  scan.     Rosalyn Gess Norins, MD  Electronically Signed    MEN/MedQ  DD: 11/15/2006  DT: 11/15/2006  Job #: [id number]   cc:   Estill Dooms. Ambrose Mantle, M.D.

## 2010-10-04 NOTE — Op Note (Signed)
NAMESHANDORA, KOOGLER                 ACCOUNT NO.:  [id number]   MEDICAL RECORD NO.:  [id number]          PATIENT TYPE:  INP   LOCATION:  2899                         FACILITY:  [facility]   PHYSICIAN:  Hilda Lias, M.D.   DATE OF BIRTH:  Dec 09, 1940   DATE OF PROCEDURE:  07/29/2008  DATE OF DISCHARGE:                               OPERATIVE REPORT   PREOPERATIVE DIAGNOSES:  L3-4, L4-5, L5-S1 degenerative disk disease  with chronic radiculopathy, L2-L3 stenosis status post lumbar surgery,  unknown procedure, in [place], [place].  L3-4, L4-5 spondylolisthesis.   POSTOPERATIVE DIAGNOSES:  L3-4, L4-5, L5-S1 degenerative disk disease  with chronic radiculopathy, L2-L3 stenosis status post lumbar surgery,  unknown procedure, in [place], [place].  L3-4, L4-5 spondylolisthesis.   PROCEDURES:  Bilateral L2, L3, L4, L5 laminectomy; L3-4 and L4-5  diskectomy, interbody fusion with cages, 8 x 22 __________ pedicle  screws for L3, L4, L5, and S1; posterolateral arthrodesis with autograft  and bone morphogenetic protein; foraminotomy; lysis of adhesion.  Cell  Saver, C-arm.   SURGEON:  Hilda Lias, MD   ASSISTANT:  Ardeth Sportsman, MD   CLINICAL HISTORY:  Ms. Tallman is a lady who underwent surgery many years  ago in [place].  We do not have any records whatsoever.  She had been  complaining of back pain with radiation to both her legs.  She was seen  in my office several days ago.  After I saw the x-ray, I advised her to  proceed with a lumbar fusion.  I also advised probably we could send her  back to the doctor who did the previous surgery.  Nevertheless, the  patient felt that she was getting worse and she wanted to proceed with  surgery.  Nevertheless, her main concern was the infection rate at [facility], because she was told by her sister-in-law that [facility]  has a high infection rate.  Because of that, her sister-in-law was taken  by the spine surgeon to [facility].   Nevertheless, I talked to her about  infection rate, told her how we prevent this from happening, but  nevertheless there is always a 1% possibility.  After she went home, she  called back, and she was ready for surgery.   DESCRIPTION OF PROCEDURE:  Ms. Hauswirth was taken to the OR, and she was  positioned in a prone manner.  The back was cleaned with DuraPrep.  A  midline incision from L1-L2 was made all the way down to L5-S1.  Muscle  and fascia were retracted all the way laterally.  Indeed, in this lady,  the anatomy was almost lost.  The patient had quite a bit of adhesion,  lysis was accomplished.  We proceeded with removal of spinous processes  of L5, 4, 3, and 2.  Then, using the drill, we started drilling the  lamina.  The patient had quite a bit of adhesions mostly in the midline  between L3-4 and L4-5.  Finally, the yellow ligament was also excised.  The patient had quite a bit of narrowing of the foramen.  Using the 1,  2, 3, and 4 mm Kerrison punches, we did foraminotomy to decompress the  L2, 3, 4, 5, and S1 nerve roots.  Then, we retracted the L5-S1 and  indeed there was almost no space.  We proceeded with L4-5 and L5-S1.  After having lysis of adhesions, we entered the disk space at those 2  levels, total gross diskectomy was done.  To achieve this, we had to  proceed with bilateral facetectomy.  Then, 2 cages of 8 x 22 with BMP  and autograft were inserted at L3-4 and 2 of them at the level of L4-5.  Then, using the C-arm in AP view and later on in lateral view, we probed  the pedicles of L3, 4, 5, and S1.  We introduced __________ screws of  5.5 x 45.  Prior to introducing the screws, we probed the hole just to  be sure that it was surrounded by bone.  Then, a rod with cap was used  to keep the screws in place.  We went laterally and removed periosteum  of the lateral aspect of the facet of L3-4, L4-5, L5-S1 and the upper  part of L2-3.  Then, a mix of BMP and autograft was  used for  arthrodesis.  Back again, we went back to the thecal sac and we probed again the  foramen.  They were wide open.  Valsalva maneuver was negative.  Then,  the wound was closed with Vicryl and Steri-Strips.  The patient did  well, and she is going to go to the PACU.           ______________________________  Hilda Lias, M.D.     EB/MEDQ  D:  07/29/2008  T:  07/30/2008  Job:  [id number]

## 2010-10-04 NOTE — Discharge Summary (Signed)
[name], SHERIFF                 ACCOUNT NO.:  [id number]   MEDICAL RECORD NO.:  [id number]          PATIENT TYPE:  INP   LOCATION:  3014                         FACILITY:  [facility]   PHYSICIAN:  Hilda Lias, M.D.   DATE OF BIRTH:  [birth year]   DATE OF ADMISSION:  07/29/2008  DATE OF DISCHARGE:  08/03/2008                               DISCHARGE SUMMARY   ADMISSION DIAGNOSIS:  Degenerative disk disease at 3-4, 4-5, and 5-1  with multiple lumbar procedure.   POSTOPERATIVE DIAGNOSIS:  Degenerative disk disease at 3-4, 4-5, and 5-1  with multiple lumbar procedure.   CLINICAL HISTORY:  The patient was admitted because of back pain with  radiation to both legs.  The patient in the past had 2 lumbar  interventions.  X-ray shows degenerative disk disease with  spondylolisthesis.  Surgery was advised.  Laboratory normal.   COURSE IN THE HOSPITAL:  The patient was taken to surgery and a fusion  from L3-S1 was done.  Today, she is walking.  She had minimal  discomfort.  The pain that she was having prior to surgery has gone.  She is being discharged today to be followed by me in my office.   MEDICATIONS:  Percocet, Flexeril, and Flomax.  She will be taking Flomax  for urinary retention.  Right now, she is voiding normally.   DIET:  Regular.   ACTIVITY:  Not to drive until she sees me.   FOLLOWUP:  She is going to see me in my office in 4 weeks or before as  needed.           ______________________________  Hilda Lias, M.D.     EB/MEDQ  D:  08/03/2008  T:  08/03/2008  Job:  [id number]

## 2010-10-07 NOTE — Discharge Summary (Signed)
NAMEDASHEA, [name]                 ACCOUNT NO.:  [id number]   MEDICAL RECORD NO.:  [id number]          PATIENT TYPE:  INP   LOCATION:  5736                         FACILITY:  [facility]   PHYSICIAN:  Adolph Pollack, M.D.DATE OF BIRTH:  [birth year]   DATE OF ADMISSION:  12/29/2004  DATE OF DISCHARGE:  01/02/2005                                 DISCHARGE SUMMARY   PRINCIPAL DISCHARGE DIAGNOSIS:  Acute perforated appendicitis.   SECONDARY DIAGNOSES:  1.  Hypokalemia.  2.  Hypertension.  3.  Hyperlipidemia.   PROCEDURE:  Laparoscopic appendectomy on December 29, 2004.   REASON FOR ADMISSION:  This 70 year old female was seen by Dr. Arthur Holms in  the office presenting with decreasing appetite and abdominal pain worse in  the right upper quadrant.  She underwent a CT scan consistent with acute  appendicitis.  She subsequently was admitted to the hospital.   HOSPITAL COURSE:  She was started on IV antibiotics and taken to the  operating room where she underwent the above procedure and was found to have  perforated appendicitis.  A 19-blake drain was left in the right lower  quadrant, right gutter and she has been maintained on Cipro and Flagyl.  She  had some hypokalemia and her potassium was corrected.  She began to have  some resumption of bowel function and her diet was advanced.  Her drain was  removed on the fourth postoperative day.  She subsequently was discharged on  January 02, 2005 in satisfactory condition.   DISPOSITION:  Discharged home in satisfactory condition.  She was given  analgesic and told to take Cipro and Flagyl as well; given a prescription  for these.  Activity restrictions were given and she will followup in the  office in approximately a week.      Adolph Pollack, M.D.  Electronically Signed     TJR/MEDQ  D:  03/22/2005  T:  03/22/2005  Job:  [id number]   cc:   Rosalyn Gess. Norins, M.D. [facility]  [address]  [place]  [place] [address]

## 2010-10-07 NOTE — Consult Note (Signed)
[name], [name] NO.:  [id number]   MEDICAL RECORD NO.:  [id number]          PATIENT TYPE:  INP   LOCATION:  5735                         FACILITY:  [facility]   PHYSICIAN:  Guy Franco, P.A.       DATE OF BIRTH:  1940-12-20   DATE OF CONSULTATION:  12/29/2004  DATE OF DISCHARGE:                                   CONSULTATION   REASON FOR CONSULTATION:  Appendicitis.   Ms. Acocella is a 70 year old female who has a four-day history of diffuse  abdominal pain that eventually localized to the right lower quadrant.  She  sought medical attention from Dr. Debby Bud.  A CT scan was performed and this  did reveal acute appendicitis.  Her white blood cell count is 22,000.  She  was sent to [facility] for surgical intervention.  She states she does  have normal bowel habits and did not admit to me any nausea, vomiting, chest  pain, shortness of breath.  Her review of systems is otherwise negative.   ALLERGIES:  1.  LATEX.  2.  PENICILLIN.   SOCIAL HISTORY:  She is married.  She quit smoking in 1997.  No illicit drug  or alcohol use.   PAST MEDICAL HISTORY:  Hypertension, hyperlipidemia, status post lumbar  laminectomy x2.   MEDICATIONS:  1.  Propranolol/HCT 40/25 one daily.  2.  Zocor 20 mg daily.   FAMILY HISTORY:  Parents deceased secondary to coronary artery disease.   PHYSICAL EXAMINATION:  HEENT:  Grossly normal.  No carotid or subclavian  bruits.  No jugular venous distention or thyromegaly.  CHEST:  Clear to auscultation bilaterally.  No wheezing or rhonchi.  HEART:  Regular rate and rhythm.  No murmur, rub or ectopy.  ABDOMEN:  Nonrigid.  She does have localized right lower quadrant pain and  good bowel sounds.  EXTREMITIES:  No peripheral edema.  NEURO:  Grossly intact.  Alert and oriented x3.  SKIN:  Warm and dry.   ASSESSMENT/PLAN:  1.  Acute appendicitis.  2.  Hypertension.  3.  Hyperlipidemia.  4.  History of lumbar laminectomy x2.  5.  Former  smoker.   Will keep her n.p.o.  Continue IV fluids, IV antibiotics, and Dr. Avel Peace has discussed the risks and benefits of surgery with the patient.  She will be taken to the operating room today.       LB/MEDQ  D:  12/29/2004  T:  12/29/2004  Job:  [id number]   cc:   Adolph Pollack, M.D.  [address], [address]  [place]  [place] [address]

## 2010-10-07 NOTE — Discharge Summary (Signed)
[name], [name]                 ACCOUNT NO.:  [id number]   MEDICAL RECORD NO.:  [id number]          PATIENT TYPE:  INP   LOCATION:  5736                         FACILITY:  [facility]   PHYSICIAN:  Rene Paci, M.D. LHCDATE OF BIRTH:  03/28/1941   DATE OF ADMISSION:  12/29/2004  DATE OF DISCHARGE:  01/02/2005                                 DISCHARGE SUMMARY   DISCHARGE DIAGNOSIS:  1.  Acute appendicitis.  2.  Hypertension.  3.  Hypokalemia.   HISTORY OF PRESENT ILLNESS:  The patient is a 70 year old female who  presented with a four day history of diffuse abdominal pain that eventually  localized to the right lower quadrant.  A CT scan was performed and it  revealed acute appendicitis.  The patient was admitted for further  evaluation and for appendectomy.   PAST MEDICAL HISTORY:  1.  Hypertension.  2.  Hyperlipidemia.  3.  Status post lumbar laminectomy x 2.   HOSPITAL COURSE:  Problem 1:  Acute appendicitis.  The patient underwent laparoscopic  appendectomy with drainage of an intra-abdominal abscess which was performed  on August 29, 2004, by Dr. Avel Peace.  The patient was maintained on IV  antibiotics and has remained stable postoperatively.  The patient will be  continued on an additional seven days of p.o. Cipro and Flagyl per surgery.   Problem 2:  Hypokalemia.  The patient was noted to be hypokalemic at the  time of admission with a potassium of 2.6.  The potassium was repleted and  at the time of discharge, potassium was 4.8.  The patient was on  Propranolol/hydrochlorothiazide 40/25 at the time of admission, the  hydrochlorothiazide component has been held, will defer to primary MD to  resume diuretic portions as patient was noted to be severely hypokalemic.   DISCHARGE MEDICATIONS:  1.  Zocor 20 mg p.o. daily.  2.  Cipro 500 mg p.o. q.12h. for seven days, prescription provided by      surgical team.  3.  Flagyl 500 mg p.o. q.8h. for seven days,  prescription provided by      surgical team.  4.  Phenergan 25 mg 1/2 to 1 tab p.o. q.6h. as needed for nausea,      prescription given by surgical team.  5.  Tylox 1-2 tabs every 4 hours as needed, prescription provided by      surgical team.  6.  Propranolol 40 mg p.o. daily, prescription given for 30 tablets.   DISCHARGE LABORATORY DATA:  Hemoglobin 12.9, hematocrit 30.4, white blood  cell count 15.9, platelets 468, BUN 4, creatinine 0.8.   FOLLOW UP:  The patient is to follow up with Dr. Debby Bud as needed.  In  addition, a postop visit has been scheduled for the patient to follow up  with Dr. Abbey Chatters of surgery on August 16 at 10 a.m.      Melissa S. Peggyann Juba, NP      Rene Paci, M.D. [facility]  Electronically Signed    MSO/MEDQ  D:  01/02/2005  T:  01/02/2005  Job:  [id number]   cc:  Rosalyn Gess Norins, M.D. [facility]  [address]  [place]  [place] [address]   Adolph Pollack, M.D.  [address], [address]  [place]  [place] [address]

## 2010-10-07 NOTE — Op Note (Signed)
NAMECRYSTAL, [name]                 ACCOUNT NO.:  [id number]   MEDICAL RECORD NO.:  [id number]          PATIENT TYPE:  INP   LOCATION:  5735                         FACILITY:  [facility]   PHYSICIAN:  Adolph Pollack, M.D.DATE OF BIRTH:  August 22, 1940   DATE OF PROCEDURE:  12/29/2004  DATE OF DISCHARGE:                                 OPERATIVE REPORT   PREOP DIAGNOSIS:  Acute appendicitis.   POSTOP DIAGNOSIS:  Perforated acute appendicitis with abscess.   PROCEDURE:  Laparoscopic appendectomy with drainage of intra-abdominal  abscess.   SURGEON:  Adolph Pollack, M.D.   ASSISTANT:  None.   ANESTHESIA:  General.   INDICATIONS FOR PROCEDURE:  This is a 70 year old female who had some  diffuse abdominal pain Sunday or Monday that persisted and radiated to the  right lower quadrant. She denied nausea and vomiting. She subsequently  presented Dr. Izora Ribas office. She did have a decreased appetite. Because of  her examine and complaint he sent her for a CT scan which was consistent  with acute appendicitis. White cell count was 22,000. She was admitted the  hospital and we are asked to see her. She indeed has acute appendicitis  clinically and radiographically. She is now brought to the operating room  for laparoscopic and possible open appendectomy. The procedure and the risks  have been explained to her. The risks include but not limited to bleeding,  infection, wound healing problems, anesthesia, and accidental damage to  intra-abdominal organs.   TECHNIQUE:  She is seen holding area and brought to the operating room,  placed on the operating room. General anesthetic was administered. Foley  catheter placed the bladder. The abdominal wall was sterilely prepped and  draped. Dilute Marcaine solution was infiltrated the subumbilical region. A  subumbilical incision was made through skin, subcutaneous tissue until the  midline fascia was identified. An incision was made in the  midline fascia  and peritoneum and the peritoneal cavity was entered under direct vision. A  pursestring suture of 0 Vicryl was placed around the fascial edges. A Hasson  trocar was introduced into the peritoneal cavity and pneumoperitoneum was  created by insufflation of CO2 gas. Next a laparoscope was introduced. I  noted fibrinous type  adhesions between the small bowel and right lower  quadrant area. A 5 mm trocar was placed in the left lower quadrant. Using  blunt dissection and hydrotherapy, I broke up some of these adhesions and  debris in the right lower quadrant area between the small bowel the anterior  abdominal wall and the small bowel and itself. Subsequently, I migrated over  to the right lower quadrant region and began mobilizing the cecum and  noticed an intra-abdominal abscess which I drained involving the right  gutter. Using careful blunt dissection I was able to identify the appendix.  There were adhesions between the antimesenteric fat pad of the distal ileum  and the appendix and these were taken down bluntly. I then mobilized the  cecum and some of the ascending right colon.   Another 5 mm trocar was placed in the  right upper quadrant. I was able to  grasp the appendix anteriorly retract it. Using the harmonic scalpel, the  mesoappendix was divided down to its base. The Endo-GIA stapler was then  used to amputate the appendix off the cecum and the appendix was placed an  Endopouch bag. The staple line appeared solid without evidence of leaking or  bleeding.   The appendix then removed through the subumbilical port and the subumbilical  trocar replaced. I then copiously irrigated out the right lower quadrant and  pelvic region with a 3 liters of normal saline solution and evacuated as  much of this as possible. Hemostasis was adequate and again the staple line  was solid. I then inserted a drain into the abdominal cavity and placed in  the right lower quadrant and  right gutter (#19 round Blake).  I removed part  of the drain through the left lower quadrant incision and anchored the drain  to the skin with a nylon suture. Following this, I removed the subumbilical  trocar and closed the fascial defect under laparoscopic vision by tightening  up and tying down the pursestring suture. The remaining trocar was removed  and pneumoperitoneum released. The remaining skin incisions were closed with  staples. Sterile dressings were applied.   She tolerated procedure without apparent complications and was taken to  recovery in satisfactory addition. I believe she is at risk for prolonged  postoperative ileus and given the amount of peritonitis inflammatory change  noted as well above the perforation the appendix and abscess.      Adolph Pollack, M.D.  Electronically Signed     TJR/MEDQ  D:  12/29/2004  T:  12/30/2004  Job:  [id number]   cc:   Rosalyn Gess. Norins, M.D. [facility]  [address]  [place]  [place] [address]

## 2010-10-25 ENCOUNTER — Ambulatory Visit: Payer: BC Managed Care – PPO | Admitting: Internal Medicine

## 2010-10-27 ENCOUNTER — Encounter: Payer: Self-pay | Admitting: Internal Medicine

## 2010-10-28 ENCOUNTER — Other Ambulatory Visit (INDEPENDENT_AMBULATORY_CARE_PROVIDER_SITE_OTHER): Payer: Medicare Other

## 2010-10-28 ENCOUNTER — Ambulatory Visit (INDEPENDENT_AMBULATORY_CARE_PROVIDER_SITE_OTHER): Payer: Medicare Other | Admitting: Internal Medicine

## 2010-10-28 ENCOUNTER — Encounter: Payer: Self-pay | Admitting: Internal Medicine

## 2010-10-28 VITALS — BP 120/82 | HR 59 | Temp 98.0°F | Wt 151.0 lb

## 2010-10-28 DIAGNOSIS — E785 Hyperlipidemia, unspecified: Secondary | ICD-10-CM

## 2010-10-28 DIAGNOSIS — I10 Essential (primary) hypertension: Secondary | ICD-10-CM

## 2010-10-28 DIAGNOSIS — M109 Gout, unspecified: Secondary | ICD-10-CM

## 2010-10-28 DIAGNOSIS — IMO0001 Reserved for inherently not codable concepts without codable children: Secondary | ICD-10-CM

## 2010-10-28 DIAGNOSIS — G629 Polyneuropathy, unspecified: Secondary | ICD-10-CM

## 2010-10-28 DIAGNOSIS — G609 Hereditary and idiopathic neuropathy, unspecified: Secondary | ICD-10-CM

## 2010-10-28 LAB — COMPREHENSIVE METABOLIC PANEL
AST: 30 U/L (ref 0–37)
Albumin: 4.3 g/dL (ref 3.5–5.2)
Alkaline Phosphatase: 65 U/L (ref 39–117)
BUN: 19 mg/dL (ref 6–23)
Calcium: 9.8 mg/dL (ref 8.4–10.5)
Chloride: 103 mEq/L (ref 96–112)
Glucose, Bld: 104 mg/dL — ABNORMAL HIGH (ref 70–99)
Potassium: 4.4 mEq/L (ref 3.5–5.1)
Sodium: 141 mEq/L (ref 135–145)
Total Protein: 7.3 g/dL (ref 6.0–8.3)

## 2010-10-28 LAB — LIPID PANEL
Cholesterol: 142 mg/dL (ref 0–200)
VLDL: 23.8 mg/dL (ref 0.0–40.0)

## 2010-10-28 LAB — CBC WITH DIFFERENTIAL/PLATELET
Basophils Absolute: 0 10*3/uL (ref 0.0–0.1)
HCT: 39.6 % (ref 36.0–46.0)
Hemoglobin: 13.7 g/dL (ref 12.0–15.0)
Lymphs Abs: 3.9 10*3/uL (ref 0.7–4.0)
MCHC: 34.5 g/dL (ref 30.0–36.0)
MCV: 90.8 fl (ref 78.0–100.0)
Monocytes Absolute: 0.8 10*3/uL (ref 0.1–1.0)
Monocytes Relative: 8 % (ref 3.0–12.0)
Neutro Abs: 4.6 10*3/uL (ref 1.4–7.7)
Platelets: 351 10*3/uL (ref 150.0–400.0)
RDW: 13.1 % (ref 11.5–14.6)

## 2010-10-28 LAB — HEPATIC FUNCTION PANEL
AST: 30 U/L (ref 0–37)
Albumin: 4.3 g/dL (ref 3.5–5.2)
Alkaline Phosphatase: 65 U/L (ref 39–117)
Bilirubin, Direct: 0.2 mg/dL (ref 0.0–0.3)

## 2010-10-29 NOTE — Assessment & Plan Note (Signed)
Good control and lab work is normal.  Plan - continue present medications.

## 2010-10-29 NOTE — Assessment & Plan Note (Signed)
Doing very well and she is adherent to both medication and to life-style management. A1C is below goal!!  Plan - continue present regimen.

## 2010-10-29 NOTE — Progress Notes (Signed)
  Subjective:    Patient ID: [name], female    DOB: 23-Oct-1940, 70 y.o.   MRN: [id number]  HPI [name] was recently diagnosed with adult on-set diabetes and was started on metformin. She has been diligently following a diabetic diet and has been exercising on a regular basis and has lost 16 lbs. She is tolerating medication without side effects. She has had no hypoglycemic episodes.  PMH, FamHx and SocHx reviewed for any changes and relevance.    Review of Systems Review of Systems  Constitutional:  Negative for fever, chills, activity change and unexpected weight change.  HENT:  Negative for hearing loss, ear pain, congestion, neck stiffness and postnasal drip.   Eyes: Negative for pain, discharge and visual disturbance.  Respiratory: Negative for chest tightness and wheezing.   Cardiovascular: Negative for chest pain and palpitations.       [No decreased exercise tolerance Gastrointestinal: [No change in bowel habit. No bloating or gas. No reflux or indigestion Genitourinary: Negative for urgency, frequency, flank pain and difficulty urinating.  Musculoskeletal: Negative for myalgias, back pain, arthralgias and gait problem.  Neurological: Negative for dizziness, tremors, weakness and headaches.  Hematological: Negative for adenopathy.  Psychiatric/Behavioral: Negative for behavioral problems and dysphoric mood.       Objective:   Physical Exam Vitals noted Gen'l - WNWD white woman in no distress HEENT - /AT Pul - normal respirations Cor - RRR  Lab Results  Component Value Date   HGBA1C 6.6* 10/28/2010          Assessment & Plan:

## 2010-10-31 ENCOUNTER — Encounter: Payer: Self-pay | Admitting: Internal Medicine

## 2010-12-13 ENCOUNTER — Other Ambulatory Visit: Payer: Self-pay | Admitting: Internal Medicine

## 2010-12-13 DIAGNOSIS — I6529 Occlusion and stenosis of unspecified carotid artery: Secondary | ICD-10-CM

## 2010-12-16 ENCOUNTER — Encounter (INDEPENDENT_AMBULATORY_CARE_PROVIDER_SITE_OTHER): Payer: Medicare Other | Admitting: *Deleted

## 2010-12-16 DIAGNOSIS — I6529 Occlusion and stenosis of unspecified carotid artery: Secondary | ICD-10-CM

## 2010-12-19 ENCOUNTER — Encounter: Payer: Self-pay | Admitting: Internal Medicine

## 2010-12-26 ENCOUNTER — Encounter: Payer: Self-pay | Admitting: Internal Medicine

## 2011-01-01 ENCOUNTER — Other Ambulatory Visit: Payer: Self-pay | Admitting: Internal Medicine

## 2011-01-02 ENCOUNTER — Encounter: Payer: Self-pay | Admitting: Internal Medicine

## 2011-01-02 ENCOUNTER — Ambulatory Visit (INDEPENDENT_AMBULATORY_CARE_PROVIDER_SITE_OTHER): Payer: Medicare Other | Admitting: Internal Medicine

## 2011-01-02 ENCOUNTER — Other Ambulatory Visit (INDEPENDENT_AMBULATORY_CARE_PROVIDER_SITE_OTHER): Payer: Medicare Other

## 2011-01-02 VITALS — HR 66 | Temp 97.0°F | Wt 150.0 lb

## 2011-01-02 DIAGNOSIS — G609 Hereditary and idiopathic neuropathy, unspecified: Secondary | ICD-10-CM

## 2011-01-02 DIAGNOSIS — Z0000 Encounter for general adult medical examination without abnormal findings: Secondary | ICD-10-CM

## 2011-01-02 DIAGNOSIS — M5430 Sciatica, unspecified side: Secondary | ICD-10-CM

## 2011-01-02 DIAGNOSIS — M109 Gout, unspecified: Secondary | ICD-10-CM

## 2011-01-02 DIAGNOSIS — E785 Hyperlipidemia, unspecified: Secondary | ICD-10-CM

## 2011-01-02 DIAGNOSIS — I10 Essential (primary) hypertension: Secondary | ICD-10-CM

## 2011-01-02 DIAGNOSIS — Z136 Encounter for screening for cardiovascular disorders: Secondary | ICD-10-CM

## 2011-01-02 DIAGNOSIS — IMO0001 Reserved for inherently not codable concepts without codable children: Secondary | ICD-10-CM

## 2011-01-02 DIAGNOSIS — G629 Polyneuropathy, unspecified: Secondary | ICD-10-CM

## 2011-01-02 LAB — URIC ACID: Uric Acid, Serum: 5.9 mg/dL (ref 2.4–7.0)

## 2011-01-02 LAB — COMPREHENSIVE METABOLIC PANEL
ALT: 30 U/L (ref 0–35)
AST: 27 U/L (ref 0–37)
Albumin: 4.4 g/dL (ref 3.5–5.2)
Alkaline Phosphatase: 67 U/L (ref 39–117)
BUN: 19 mg/dL (ref 6–23)
CO2: 31 mEq/L (ref 19–32)
Calcium: 9.9 mg/dL (ref 8.4–10.5)
Chloride: 98 mEq/L (ref 96–112)
Creatinine, Ser: 0.6 mg/dL (ref 0.4–1.2)
GFR: 102.71 mL/min (ref >60.00)
Glucose, Bld: 104 mg/dL — ABNORMAL HIGH (ref 70–99)
Potassium: 4.4 mEq/L (ref 3.5–5.1)
Sodium: 139 mEq/L (ref 135–145)
Total Bilirubin: 1.4 mg/dL — ABNORMAL HIGH (ref 0.3–1.2)
Total Protein: 7.5 g/dL (ref 6.0–8.3)

## 2011-01-02 LAB — CBC WITH DIFFERENTIAL/PLATELET
Basophils Absolute: 0 10*3/uL (ref 0.0–0.1)
Eosinophils Relative: 4.6 % (ref 0.0–5.0)
Monocytes Absolute: 0.7 10*3/uL (ref 0.1–1.0)
Monocytes Relative: 8 % (ref 3.0–12.0)
Neutrophils Relative %: 52.8 % (ref 43.0–77.0)
Platelets: 331 10*3/uL (ref 150.0–400.0)
RDW: 13.2 % (ref 11.5–14.6)
WBC: 9.1 10*3/uL (ref 4.5–10.5)

## 2011-01-02 LAB — TSH: TSH: 1.34 u[IU]/mL (ref 0.35–5.50)

## 2011-01-02 LAB — HEPATIC FUNCTION PANEL
ALT: 30 U/L (ref 0–35)
AST: 27 U/L (ref 0–37)
Albumin: 4.4 g/dL (ref 3.5–5.2)
Alkaline Phosphatase: 67 U/L (ref 39–117)
Bilirubin, Direct: 0.2 mg/dL (ref 0.0–0.3)
Total Bilirubin: 1.4 mg/dL — ABNORMAL HIGH (ref 0.3–1.2)
Total Protein: 7.5 g/dL (ref 6.0–8.3)

## 2011-01-02 LAB — LIPID PANEL
Cholesterol: 115 mg/dL (ref 0–200)
LDL Cholesterol: 43 mg/dL (ref 0–99)
Triglycerides: 151 mg/dL — ABNORMAL HIGH (ref 0.0–149.0)
VLDL: 30.2 mg/dL (ref 0.0–40.0)

## 2011-01-02 LAB — HEMOGLOBIN A1C: Hgb A1c MFr Bld: 6.3 % (ref 4.6–6.5)

## 2011-01-02 NOTE — Progress Notes (Signed)
Subjective:    Patient ID: [name], female    DOB: 06/11/1940, 70 y.o.   MRN: [id number]  HPI  The patient is here for annual Medicare wellness examination and management of other chronic and acute problems. She reports that she has been tolerating her metformin with some abdomnial bloating. She has not had any episodes of low blood sugar.    The risk factors are reflected in the social history.  The roster of all physicians providing medical care to patient - is listed in the Snapshot section of the chart.  Activities of daily living:  The patient is 100% inedpendent in all ADLs: dressing, toileting, feeding as well as independent mobility  Home safety : The patient has smoke detectors in the home. They wear seatbelts. firearms at home. There is no violence in the home. No falls reports and no fall risks are present in the home.  There is no risks for hepatitis, STDs or HIV. There is no   history of blood transfusion. They have no travel history to infectious disease endemic areas of the world.  The patient has seen their dentist in the last six month. They have seen their eye doctor in the last year. They deny  any hearing difficulty and have not had audiologic testing in the last year.  They do not  have excessive sun exposure. Discussed the need for sun protection: hats, long sleeves and use of sunscreen if there is significant sun exposure.   Diet: the importance of a healthy diet is discussed. They do have a healthy (unhealthy-high fat/fast food) diet.  The patient has a regular exercise program: aerobics , 45 min duration, 4 times per week.  The benefits of regular aerobic exercise were discussed.  Depression screen: there are no signs or vegative symptoms of depression- irritability, change in appetite, anhedonia, sadness/tearfullness.  Cognitive assessment: the patient manages all their financial and personal affairs and is actively engaged. They could relate day,date,year and  events; recalled 3/3 objects at 3 minutes; performed clock-face test normally.  The following portions of the patient's history were reviewed and updated as appropriate: allergies, current medications, past family history, past medical history,  past surgical history, past social history  and problem list.  Vision, hearing, body mass index were assessed and reviewed.   During the course of the visit the patient was educated and counseled about appropriate screening and preventive services including : fall prevention , diabetes screening, nutrition counseling, colorectal cancer screening, and recommended immunizations.  Past Medical History  Diagnosis Date  . Type II or unspecified type diabetes mellitus without mention of complication, uncontrolled   . Routine general medical examination at a health care facility   . Occlusion and stenosis of carotid artery without mention of cerebral infarction   . Unspecified venous (peripheral) insufficiency   . Displacement of lumbar intervertebral disc without myelopathy   . Sciatica   . Pain in limb   . Pain in joint, shoulder region   . Contact dermatitis and other eczema, due to unspecified cause   . Gout, unspecified   . Other and unspecified hyperlipidemia   . Unspecified essential hypertension    Past Surgical History  Procedure Date  . Laparoscopic appendectomy   . Tubal ligation   . Lumbar laminectomy 07/2008    [place]   Family History  Problem Relation Age of Onset  . Alzheimer's disease Mother   . Diabetes Mother   . Coronary artery disease Father  CABG  . Hypertension Father   . Colon cancer Neg Hx   . Breast cancer Neg Hx   . Cancer Neg Hx     breast or colon   History   Social History  . Marital Status: Married    Spouse Name: N/A    Number of Children: N/A  . Years of Education: N/A   Occupational History  . Not on file.   Social History Main Topics  . Smoking status: Former Smoker    Quit date: 05/23/1995    . Smokeless tobacco: Not on file  . Alcohol Use: Not on file  . Drug Use: No  . Sexually Active: Yes -- Female partner(s)   Other Topics Concern  . Not on file   Social History Narrative   HSG.  Married '59.  2 sons - '65, '62; 1 dtr '61; 7 grandchildren; 3 great-grandchildren.  Lives w/husband in own home.  END OF LIFE: she does not want to be kept alive on machines. Needs more information in regard to CPR, etc. Provided w/[employer]'s guide and MOST (7/09)        Review of Systems Review of Systems  Constitutional:  Negative for fever, chills, activity change and unexpected weight change.  HEENT:  Negative for hearing loss, ear pain, congestion, neck stiffness and postnasal drip. Negative for sore throat or swallowing problems. Negative for dental complaints.   Eyes: Negative for vision loss or change in visual acuity.  Respiratory: Negative for chest tightness and wheezing.   Cardiovascular: Negative for chest pain and palpitation. No decreased exercise tolerance Gastrointestinal: No change in bowel habit. No bloating or gas. No reflux or indigestion Genitourinary: Negative for urgency, frequency, flank pain and difficulty urinating.  Musculoskeletal: Negative for myalgias, back pain, arthralgias and gait problem.  Neurological: Negative for dizziness, tremors, weakness and headaches.  Hematological: Negative for adenopathy.  Psychiatric/Behavioral: Negative for behavioral problems and dysphoric mood.       Objective:   Physical Exam Vitals reviewed. Gen'l: well nourished, well developed white woman in no distress HEENT - [place]/AT, EACs/TMs normal, oropharynx with native dentition in good condition, no buccal or palatal lesions, posterior pharynx clear, mucous membranes moist. C&S clear, PERRLA, fundi - normal Neck - supple, no thyromegaly Nodes- negative submental, cervical, supraclavicular regions Chest - no deformity, no CVAT Lungs - clear without rales, wheezes. No increased  work of breathing Breast - deferred (has had mammogram) Cardiovascular - regular rate and rhythm, quiet precordium, no murmurs, rubs or gallops, 2+ radial, 1+ DP and PT pulses Abdomen - BS+ x 4, no HSM, no guarding or rebound or tenderness Pelvic - deferred  Rectal - deferred  Extremities - no clubbing, cyanosis, edema or deformity. Foot exam: no callus, deformity except for mild 1st over 2nd toe right, sensation to light touch and pin prick normal, decreased deep vibratory sensation left foot Neuro - A&O x 3, CN II-XII normal, motor strength normal and equal, DTRs 2+ and symmetrical biceps, radial, and patellar tendons. Cerebellar - no tremor, no rigidity, fluid movement and normal gait. Derm - Head, neck, back, abdomen and extremities without suspicious lesions  Lab Results  Component Value Date   WBC 9.1 01/02/2011   HGB 14.2 01/02/2011   HCT 42.4 01/02/2011   PLT 331.0 01/02/2011   CHOL 115 01/02/2011   TRIG 151.0* 01/02/2011   HDL 42.30 01/02/2011   LDLDIRECT 81.6 12/31/2009   ALT 30 01/02/2011   ALT 30 01/02/2011   AST 27 01/02/2011  AST 27 01/02/2011   NA 139 01/02/2011   K 4.4 01/02/2011   CL 98 01/02/2011   CREATININE 0.6 01/02/2011   BUN 19 01/02/2011   CO2 31 01/02/2011   TSH 1.34 01/02/2011   HGBA1C 6.3 01/02/2011           Assessment & Plan:

## 2011-01-03 ENCOUNTER — Encounter: Payer: Self-pay | Admitting: Internal Medicine

## 2011-01-03 DIAGNOSIS — Z0000 Encounter for general adult medical examination without abnormal findings: Secondary | ICD-10-CM | POA: Insufficient documentation

## 2011-01-03 NOTE — Assessment & Plan Note (Signed)
LDL less than 100, at goal on present dose of zocor  Plan - no change in medication

## 2011-01-03 NOTE — Assessment & Plan Note (Signed)
Minimal decreased deep vibratory sensation left foot

## 2011-01-03 NOTE — Assessment & Plan Note (Signed)
Excellent control on present medical regimen with low A1C.   Plan - continue metformin and diet management.            Repeat A1C in 6 months.

## 2011-01-03 NOTE — Assessment & Plan Note (Signed)
Interval history is benign with no medication problems or new illness. Physical exam, sans pelvic and breast exam, is normal. Lab results are GREAT except for triglycerides which can be addressed by better vigilance in regard to fat intake. Last mammogram in EMR Feb '11 ( every 2 years per [facility] ). Last colonoscopy June '09. Immunizations: Tetanus March '09; due for pneumonia vaccine. 12 lead EKG w/o sings of ischemia or injury. Memory - normal mini-cognitive exam and normal clock face exercise.  In summary - a very nice woman who appears to be medically stable with good control of chronic conditions. She will return as needed, in 6 months for routine A1C and in 1 year for full exam.

## 2011-01-03 NOTE — Assessment & Plan Note (Signed)
Stable with no complaint of pain at today's visit.

## 2011-01-03 NOTE — Assessment & Plan Note (Signed)
BP Readings from Last 3 Encounters:  10/28/10 120/82  08/23/10 128/78  07/21/10 142/80   Good control on present medications and renal function and electrolytes are normal.  Plan - continue present medications

## 2011-01-03 NOTE — Assessment & Plan Note (Addendum)
No recent flares of gout. Uric acid level normal at 5.9. No indication for prophylactic medication

## 2011-01-09 ENCOUNTER — Encounter: Payer: Self-pay | Admitting: Internal Medicine

## 2011-01-12 ENCOUNTER — Telehealth: Payer: Self-pay | Admitting: *Deleted

## 2011-01-12 NOTE — Telephone Encounter (Signed)
Ok to take sugar free robitussin DM for cand

## 2011-01-12 NOTE — Telephone Encounter (Signed)
Patient requesting advisement from MD. She is c/o "cold" - sinus congestion & drainage and does not know what is ok to take OTC b/c she is diabetic.

## 2011-01-12 NOTE — Telephone Encounter (Signed)
After reconnecting: claritin, etc for drainage, APAP for myalgias. If needed sudafed 30mg  bid or tid.

## 2011-01-13 NOTE — Telephone Encounter (Signed)
Patient informed. 

## 2011-01-22 ENCOUNTER — Other Ambulatory Visit: Payer: Self-pay | Admitting: Internal Medicine

## 2011-06-30 ENCOUNTER — Other Ambulatory Visit (INDEPENDENT_AMBULATORY_CARE_PROVIDER_SITE_OTHER): Payer: Medicare Other

## 2011-06-30 DIAGNOSIS — IMO0001 Reserved for inherently not codable concepts without codable children: Secondary | ICD-10-CM

## 2011-06-30 LAB — HEMOGLOBIN A1C: Hgb A1c MFr Bld: 6.4 % (ref 4.6–6.5)

## 2011-07-02 ENCOUNTER — Encounter: Payer: Self-pay | Admitting: Internal Medicine

## 2011-07-04 DIAGNOSIS — Z1231 Encounter for screening mammogram for malignant neoplasm of breast: Secondary | ICD-10-CM | POA: Diagnosis not present

## 2011-07-06 ENCOUNTER — Encounter: Payer: Self-pay | Admitting: Internal Medicine

## 2011-07-06 ENCOUNTER — Ambulatory Visit (INDEPENDENT_AMBULATORY_CARE_PROVIDER_SITE_OTHER): Payer: Medicare Other | Admitting: Internal Medicine

## 2011-07-06 DIAGNOSIS — IMO0001 Reserved for inherently not codable concepts without codable children: Secondary | ICD-10-CM | POA: Diagnosis not present

## 2011-07-06 DIAGNOSIS — I10 Essential (primary) hypertension: Secondary | ICD-10-CM | POA: Diagnosis not present

## 2011-07-08 NOTE — Assessment & Plan Note (Signed)
Lab Results  Component Value Date   HGBA1C 6.4 06/30/2011   Very good control.   Plan - continue present regimen

## 2011-07-08 NOTE — Assessment & Plan Note (Signed)
BP Readings from Last 3 Encounters:  07/06/11 142/84  10/28/10 120/82  08/23/10 128/78   OK control.

## 2011-07-08 NOTE — Progress Notes (Signed)
  Subjective:    Patient ID: Anastasio Auerbach, female    DOB: 10-05-1940, 71 y.o.   MRN: [id number]  HPI Mrs. Pedigo presents to review her recent A1c for diabetes management. She has been doing well. Her dietary adherence is good. She feels well. No report of any hypoglycemic episodes.  PMH, FamHx and SocHx reviewed for any changes and relevance.    Review of Systems System review is negative for any constitutional, cardiac, pulmonary, GI or neuro symptoms or complaints other than as described in the HPI.     Objective:   Physical Exam Filed Vitals:   07/06/11 1135  BP: 142/84  Pulse: 62  Temp: 97 F (36.1 C)  Resp: 14   Gen'l- WNWD white woman Pulm - normal respirations Cor - RRR       Assessment & Plan:

## 2011-07-11 ENCOUNTER — Encounter: Payer: Self-pay | Admitting: Internal Medicine

## 2011-07-17 DIAGNOSIS — Z0000 Encounter for general adult medical examination without abnormal findings: Secondary | ICD-10-CM | POA: Diagnosis not present

## 2011-07-17 DIAGNOSIS — Z01419 Encounter for gynecological examination (general) (routine) without abnormal findings: Secondary | ICD-10-CM | POA: Diagnosis not present

## 2011-07-17 DIAGNOSIS — Z124 Encounter for screening for malignant neoplasm of cervix: Secondary | ICD-10-CM | POA: Diagnosis not present

## 2011-07-26 ENCOUNTER — Other Ambulatory Visit: Payer: Self-pay | Admitting: Internal Medicine

## 2011-10-26 ENCOUNTER — Telehealth: Payer: Self-pay | Admitting: Internal Medicine

## 2011-10-26 NOTE — Telephone Encounter (Signed)
Caller: Victoriya/Patient; PCP: Illene Regulus; CB#: [phone]; Call regarding Scratchy Throat and Nasal Congestion-onset 10/25/11. Abebrile, no other sx.  Husband has had cold for 3-4 days; She has gargled with salt water and helped some. Triage and care advice per Sore Throat Protocol and advise to call back if sx not improving with home care or any worsening sx.

## 2011-11-09 DIAGNOSIS — E119 Type 2 diabetes mellitus without complications: Secondary | ICD-10-CM | POA: Diagnosis not present

## 2011-12-11 ENCOUNTER — Encounter: Payer: Self-pay | Admitting: Internal Medicine

## 2011-12-11 ENCOUNTER — Ambulatory Visit (INDEPENDENT_AMBULATORY_CARE_PROVIDER_SITE_OTHER): Payer: Medicare Other | Admitting: Internal Medicine

## 2011-12-11 VITALS — BP 138/76 | HR 63 | Temp 98.0°F | Resp 16 | Wt 144.0 lb

## 2011-12-11 DIAGNOSIS — R29818 Other symptoms and signs involving the nervous system: Secondary | ICD-10-CM | POA: Diagnosis not present

## 2011-12-11 DIAGNOSIS — I10 Essential (primary) hypertension: Secondary | ICD-10-CM | POA: Diagnosis not present

## 2011-12-11 DIAGNOSIS — R2689 Other abnormalities of gait and mobility: Secondary | ICD-10-CM | POA: Insufficient documentation

## 2011-12-11 DIAGNOSIS — F4321 Adjustment disorder with depressed mood: Secondary | ICD-10-CM | POA: Diagnosis not present

## 2011-12-11 DIAGNOSIS — R51 Headache: Secondary | ICD-10-CM

## 2011-12-11 DIAGNOSIS — F4320 Adjustment disorder, unspecified: Secondary | ICD-10-CM | POA: Insufficient documentation

## 2011-12-11 DIAGNOSIS — R519 Headache, unspecified: Secondary | ICD-10-CM

## 2011-12-11 NOTE — Assessment & Plan Note (Signed)
[name] is very emotional and when talking about her brothers deaths she becomes very tearful. Suspect unprocessed grief and loss.  Plan strongly suggested grief counseling. Provided tele # for [facility]

## 2011-12-11 NOTE — Progress Notes (Signed)
Subjective:    Patient ID: [name], female    DOB: [birth year], 71 y.o.   MRN: [id number]  HPI [name] presents for evaluation of dizziness. This is of short duration and is associated with head movement. She does report a sensation of sinus pressure. She describes her symptoms as more of a balance issue than true vertigo. No unusual headaches but she feels like her head is going to burst, no double vision, no expressive aphasia, she has a problem with chronic paresthesia right hand, occasional paresthesia at the corner of her mouth. No tinnitus, loss of hearing, change in coordination.  She reports that she is subject to unexpected tearfullness. On questioning she did have two deaths in her family: 1 brother died in 2023/04/24, 1 brother dierd in April '12. She has never had a chance to process her grief. She does admit to irritability, mild worry with perseveration, tearfulness but denies anhedonia, change in sleep or appetite.  Past Medical History  Diagnosis Date  . Type II or unspecified type diabetes mellitus without mention of complication, uncontrolled   . Routine general medical examination at a health care facility   . Occlusion and stenosis of carotid artery without mention of cerebral infarction   . Unspecified venous (peripheral) insufficiency   . Displacement of lumbar intervertebral disc without myelopathy   . Sciatica   . Pain in limb   . Pain in joint, shoulder region   . Contact dermatitis and other eczema, due to unspecified cause   . Gout, unspecified   . Other and unspecified hyperlipidemia   . Unspecified essential hypertension    Past Surgical History  Procedure Date  . Laparoscopic appendectomy   . Tubal ligation   . Lumbar laminectomy 07/2008    [place]   Family History  Problem Relation Age of Onset  . Alzheimer's disease Mother   . Diabetes Mother   . Coronary artery disease Father     CABG  . Hypertension Father   . Colon cancer Neg Hx   . Breast  cancer Neg Hx   . Cancer Neg Hx     breast or colon   History   Social History  . Marital Status: Married    Spouse Name: N/A    Number of Children: N/A  . Years of Education: N/A   Occupational History  . Not on file.   Social History Main Topics  . Smoking status: Former Smoker    Quit date: 05/23/1995  . Smokeless tobacco: Not on file  . Alcohol Use: Not on file  . Drug Use: No  . Sexually Active: Yes -- Female partner(s)   Other Topics Concern  . Not on file   Social History Narrative   HSG.  Married '59.  2 sons - '65, '62; 1 dtr '61; 7 grandchildren; 3 great-grandchildren.  Lives w/husband in own home.  END OF LIFE: she does not want to be kept alive on machines. Needs more information in regard to CPR, etc. Provided w/L[employer]'s guide and MOST (7/09)    Current Outpatient Prescriptions on File Prior to Visit  Medication Sig Dispense Refill  . metFORMIN (GLUCOPHAGE) 500 MG tablet TAKE ONE TABLET BY MOUTH TWICE DAILY  60 tablet  11  . propranolol-hydrochlorothiazide (INDERIDE) 40-25 MG per tablet TAKE ONE TABLET BY MOUTH ONE TIME DAILY  30 tablet  10  . simvastatin (ZOCOR) 20 MG tablet TAKE ONE TABLET BY MOUTH ONE TIME DAILY  30 tablet  11  .  Multiple Vitamins-Minerals (CENTRUM ULTRA WOMENS) TABS Take by mouth daily.            Review of Systems System review is negative for any constitutional, cardiac, pulmonary, GI or neuro symptoms or complaints other than as described in the HPI.     Objective:   Physical Exam Filed Vitals:   12/11/11 1135  BP: 138/76  Pulse: 63  Temp: 98 F (36.7 C)  Resp: 16   Gen'l - WNWD white woman in no distress but does become tearful during the visit. HEENT - C&S clear, no sinus tenderness to percussion.  Neck- supple Cor - RRR Pulm - normal respirations Neuro - A&O x 3, speech clear, cognition is normal. CN II-XII - normal facial symmetry and movement, PERRLA, EOMI, no tongue fasciculations or deviation, normal shoulder shrug.  MS 5/5, cerebellar - no tremor, normal gait, tandem gait a little unsteady.       Assessment & Plan:

## 2011-12-11 NOTE — Assessment & Plan Note (Signed)
BP Readings from Last 3 Encounters:  12/11/11 138/76  07/06/11 142/84  10/28/10 120/82

## 2011-12-11 NOTE — Patient Instructions (Addendum)
Balance problems - may be an inner ear problem. However, with the pressure in your head and the intermittent nature of the balance problem will order an MRI brain to rule out any possible stroke or intracranial abnormality.  Grief and loss - a powerful set of emotions. Please consider short-term grief counseling to help you process this and start to put it behind you.

## 2011-12-11 NOTE — Assessment & Plan Note (Signed)
Patient with intermittent dysequilibrium of brief duration. Minimal other symptoms but she does c/o terrific pressure, not pain, in the frontal part of her head. No evidence of sinus infection. With DM and carotid disease she is at high risk for ASVD brain - cerebellum or brainstem.  Plan - MRI brain.

## 2011-12-13 ENCOUNTER — Other Ambulatory Visit: Payer: Self-pay | Admitting: Internal Medicine

## 2011-12-14 ENCOUNTER — Ambulatory Visit (HOSPITAL_COMMUNITY)
Admission: RE | Admit: 2011-12-14 | Discharge: 2011-12-14 | Disposition: A | Discharge status: Home / Self Care | Payer: Medicare Other | Source: Ambulatory Visit | Attending: Internal Medicine | Admitting: Internal Medicine

## 2011-12-14 DIAGNOSIS — R279 Unspecified lack of coordination: Secondary | ICD-10-CM | POA: Insufficient documentation

## 2011-12-14 DIAGNOSIS — R519 Headache, unspecified: Secondary | ICD-10-CM

## 2011-12-14 DIAGNOSIS — R51 Headache: Secondary | ICD-10-CM | POA: Insufficient documentation

## 2011-12-14 DIAGNOSIS — R2689 Other abnormalities of gait and mobility: Secondary | ICD-10-CM

## 2011-12-15 ENCOUNTER — Inpatient Hospital Stay (HOSPITAL_COMMUNITY): Admission: RE | Admit: 2011-12-15 | Payer: Medicare Other | Source: Ambulatory Visit

## 2011-12-18 ENCOUNTER — Telehealth: Payer: Self-pay | Admitting: *Deleted

## 2011-12-18 NOTE — Telephone Encounter (Signed)
Message copied by Elnora Morrison on Mon Dec 18, 2011  1:46 PM ------      Message from: Jacques Navy      Created: Sun Dec 17, 2011 10:53 PM       Call patient - MRI brain is normal.

## 2011-12-18 NOTE — Telephone Encounter (Signed)
Patient notified of normal MRI of brain.

## 2011-12-20 ENCOUNTER — Other Ambulatory Visit: Payer: Self-pay | Admitting: *Deleted

## 2011-12-20 DIAGNOSIS — I6529 Occlusion and stenosis of unspecified carotid artery: Secondary | ICD-10-CM

## 2011-12-21 ENCOUNTER — Encounter (INDEPENDENT_AMBULATORY_CARE_PROVIDER_SITE_OTHER): Payer: Medicare Other

## 2011-12-21 DIAGNOSIS — I6529 Occlusion and stenosis of unspecified carotid artery: Secondary | ICD-10-CM | POA: Diagnosis not present

## 2012-01-02 ENCOUNTER — Telehealth: Payer: Self-pay | Admitting: *Deleted

## 2012-01-02 NOTE — Telephone Encounter (Signed)
Message copied by Elnora Morrison on Tue Jan 02, 2012  8:22 AM ------      Message from: Jacques Navy      Created: Sun Dec 31, 2011  6:27 PM       Call patient: study with 40-59% occlusion of the internal carotid arteries - unchanged. Follow-up in 1 year.

## 2012-01-02 NOTE — Telephone Encounter (Signed)
Patient notified of carotid doppler results. No questions voiced.

## 2012-01-04 ENCOUNTER — Other Ambulatory Visit (INDEPENDENT_AMBULATORY_CARE_PROVIDER_SITE_OTHER): Payer: Medicare Other

## 2012-01-04 ENCOUNTER — Ambulatory Visit (INDEPENDENT_AMBULATORY_CARE_PROVIDER_SITE_OTHER): Payer: Medicare Other | Admitting: Internal Medicine

## 2012-01-04 ENCOUNTER — Encounter: Payer: Self-pay | Admitting: Internal Medicine

## 2012-01-04 VITALS — BP 132/74 | HR 70 | Temp 98.0°F | Resp 16 | Wt 146.0 lb

## 2012-01-04 DIAGNOSIS — E785 Hyperlipidemia, unspecified: Secondary | ICD-10-CM

## 2012-01-04 DIAGNOSIS — I10 Essential (primary) hypertension: Secondary | ICD-10-CM | POA: Diagnosis not present

## 2012-01-04 DIAGNOSIS — M109 Gout, unspecified: Secondary | ICD-10-CM

## 2012-01-04 DIAGNOSIS — IMO0001 Reserved for inherently not codable concepts without codable children: Secondary | ICD-10-CM

## 2012-01-04 DIAGNOSIS — R29818 Other symptoms and signs involving the nervous system: Secondary | ICD-10-CM | POA: Diagnosis not present

## 2012-01-04 DIAGNOSIS — I6529 Occlusion and stenosis of unspecified carotid artery: Secondary | ICD-10-CM

## 2012-01-04 DIAGNOSIS — R2689 Other abnormalities of gait and mobility: Secondary | ICD-10-CM

## 2012-01-04 DIAGNOSIS — M5126 Other intervertebral disc displacement, lumbar region: Secondary | ICD-10-CM

## 2012-01-04 DIAGNOSIS — Z0000 Encounter for general adult medical examination without abnormal findings: Secondary | ICD-10-CM

## 2012-01-04 LAB — LIPID PANEL
HDL: 40.3 mg/dL (ref >39.00)
Total CHOL/HDL Ratio: 3

## 2012-01-04 LAB — COMPREHENSIVE METABOLIC PANEL
ALT: 27 U/L (ref 0–35)
Albumin: 4.2 g/dL (ref 3.5–5.2)
CO2: 32 mEq/L (ref 19–32)
Calcium: 9.9 mg/dL (ref 8.4–10.5)
Chloride: 101 mEq/L (ref 96–112)
GFR: 115.42 mL/min (ref >60.00)
Glucose, Bld: 89 mg/dL (ref 70–99)
Potassium: 4.5 mEq/L (ref 3.5–5.1)
Sodium: 141 mEq/L (ref 135–145)
Total Protein: 7.5 g/dL (ref 6.0–8.3)

## 2012-01-04 LAB — HEPATIC FUNCTION PANEL
AST: 20 U/L (ref 0–37)
Albumin: 4.2 g/dL (ref 3.5–5.2)
Total Bilirubin: 0.9 mg/dL (ref 0.3–1.2)

## 2012-01-04 LAB — HEMOGLOBIN A1C: Hgb A1c MFr Bld: 6.2 % (ref 4.6–6.5)

## 2012-01-04 LAB — URIC ACID: Uric Acid, Serum: 5.7 mg/dL (ref 2.4–7.0)

## 2012-01-04 NOTE — Progress Notes (Signed)
Subjective:    Patient ID: [name], female    DOB: [birth year], 71 y.o.   MRN: [id number]  HPI The patient is here for annual Medicare wellness examination and management of other chronic and acute problems.   Cc: She continues to have transient episodes of dysequilibrium lasting seconds. MRI brain July '13 was normal.    The risk factors are reflected in the social history.  The roster of all physicians providing medical care to patient - is listed in the Snapshot section of the chart.  Activities of daily living:  The patient is 100% inedpendent in all ADLs: dressing, toileting, feeding as well as independent mobility  Home safety : The patient has smoke detectors in the home. Fall - fall safe home.They wear seatbelts.  firearms are present in the home, kept in a safe fashion. There is no violence in the home.   There is no risks for hepatitis, STDs or HIV. There is no   history of blood transfusion. They have no travel history to infectious disease endemic areas of the world.  The patient has seen their dentist in the last six month. They have seen their eye doctor in the last year. They deny any hearing difficulty and have not had audiologic testing in the last year.    They do not  have excessive sun exposure. Discussed the need for sun protection: hats, long sleeves and use of sunscreen if there is significant sun exposure.   Diet: the importance of a healthy diet is discussed. They do have a healthy diet.  The patient has a regular exercise program: ahoa class , 45 min duration,  4 per week.  The benefits of regular aerobic exercise were discussed.  Depression screen: there are no signs or vegative symptoms of depression- irritability, change in appetite, anhedonia, sadness/tearfullness.  Cognitive assessment: the patient manages all their financial and personal affairs and is actively engaged. They could relate day,date,year and events; recalled 2/3 objects at 3 minutes;  performed clock-face test normally.  The following portions of the patient's history were reviewed and updated as appropriate: allergies, current medications, past family history, past medical history,  past surgical history, past social history  and problem list.  Vision, hearing, body mass index were assessed and reviewed.   During the course of the visit the patient was educated and counseled about appropriate screening and preventive services including : fall prevention , diabetes screening, nutrition counseling, colorectal cancer screening, and recommended immunizations.  Past Medical History  Diagnosis Date  . Type II or unspecified type diabetes mellitus without mention of complication, uncontrolled   . Routine general medical examination at a health care facility   . Occlusion and stenosis of carotid artery without mention of cerebral infarction   . Unspecified venous (peripheral) insufficiency   . Displacement of lumbar intervertebral disc without myelopathy   . Sciatica   . Pain in limb   . Pain in joint, shoulder region   . Contact dermatitis and other eczema, due to unspecified cause   . Gout, unspecified   . Other and unspecified hyperlipidemia   . Unspecified essential hypertension    Past Surgical History  Procedure Date  . Laparoscopic appendectomy   . Tubal ligation   . Lumbar laminectomy 07/2008    [place]   Family History  Problem Relation Age of Onset  . Alzheimer's disease Mother   . Diabetes Mother   . Coronary artery disease Father     CABG  .  Hypertension Father   . Colon cancer Neg Hx   . Breast cancer Neg Hx   . Cancer Neg Hx     breast or colon   History   Social History  . Marital Status: Married    Spouse Name: N/A    Number of Children: N/A  . Years of Education: N/A   Occupational History  . Not on file.   Social History Main Topics  . Smoking status: Former Smoker    Quit date: 05/23/1995  . Smokeless tobacco: Not on file  .  Alcohol Use: Not on file  . Drug Use: No  . Sexually Active: Yes -- Female partner(s)   Other Topics Concern  . Not on file   Social History Narrative   HSG.  Married '59.  2 sons - '65, '62; 1 dtr '61; 7 grandchildren; 3 great-grandchildren.  Lives w/husband in own home.  END OF LIFE: she does not want to be kept alive on machines. Needs more information in regard to CPR, etc. Provided w/[employer] guide and MOST (7/09)    Current Outpatient Prescriptions on File Prior to Visit  Medication Sig Dispense Refill  . metFORMIN (GLUCOPHAGE) 500 MG tablet TAKE ONE TABLET BY MOUTH TWICE DAILY  60 tablet  11  . propranolol-hydrochlorothiazide (INDERIDE) 40-25 MG per tablet TAKE ONE TABLET BY MOUTH ONE TIME DAILY  30 tablet  9  . simvastatin (ZOCOR) 20 MG tablet TAKE ONE TABLET BY MOUTH ONE TIME DAILY  30 tablet  11  . Multiple Vitamins-Minerals (CENTRUM ULTRA WOMENS) TABS Take by mouth daily.            Review of Systems Constitutional:  Negative for fever, chills, activity change and unexpected weight change.  HEENT:  Negative for hearing loss, ear pain, congestion, neck stiffness and postnasal drip. Negative for sore throat or swallowing problems. Negative for dental complaints.   Eyes: Negative for vision loss or change in visual acuity.  Respiratory: Negative for chest tightness and wheezing. Negative for DOE.   Cardiovascular: Negative for chest pain or palpitations. No decreased exercise tolerance Gastrointestinal: No change in bowel habit. No bloating or gas. No reflux or indigestion Genitourinary: Negative for urgency, frequency, flank pain and difficulty urinating.  Musculoskeletal: Negative for myalgias, back pain, arthralgias and gait problem.  Neurological: Negative for dizziness, tremors, weakness and headaches.  Hematological: Negative for adenopathy.  Psychiatric/Behavioral: Negative for behavioral problems and dysphoric mood.       Objective:   Physical Exam Filed Vitals:    01/04/12 0835  BP: 132/74  Pulse: 70  Temp: 98 F (36.7 C)  Resp: 16   Wt Readings from Last 3 Encounters:  01/04/12 146 lb (66.225 kg)  12/11/11 144 lb (65.318 kg)  07/06/11 146 lb 8 oz (66.452 kg)   Gen'l: well nourished, well developed white woman in no distress HEENT - [place]/AT, EACs/TMs normal, oropharynx with native dentition in good condition, no buccal or palatal lesions, posterior pharynx clear, mucous membranes moist. C&S clear, PERRLA, fundi - normal Neck - supple, no thyromegaly Nodes- negative submental, cervical, supraclavicular regions Chest - no deformity, no CVAT Lungs - clear without rales, wheezes. No increased work of breathing Breast - deferred to gyn Cardiovascular - regular rate and rhythm, quiet precordium, no murmurs, rubs or gallops, 2+ radial, DP and PT pulses Abdomen - BS+ x 4, no HSM, no guarding or rebound or tenderness Pelvic - deferred to gyn Rectal - deferred to gyn Extremities - no clubbing, cyanosis, edema  or deformity.  Neuro - A&O x 3, CN II-XII normal, motor strength normal and equal, DTRs 2+ and symmetrical biceps, radial, and patellar tendons. Cerebellar - no tremor, no rigidity, fluid movement and normal gait.Sensation right foot: decrease light touch/pin-prick, vibration. Derm - Head, neck, back, abdomen and extremities without suspicious lesions  Lab Results  Component Value Date   WBC 9.1 01/02/2011   HGB 14.2 01/02/2011   HCT 42.4 01/02/2011   PLT 331.0 01/02/2011   GLUCOSE 89 01/04/2012   CHOL 120 01/04/2012   TRIG 194.0* 01/04/2012   HDL 40.30 01/04/2012   LDLDIRECT 81.6 12/31/2009   LDLCALC 41 01/04/2012        ALT 27 01/04/2012   AST 20 01/04/2012        NA 141 01/04/2012   K 4.5 01/04/2012   CL 101 01/04/2012   CREATININE 0.6 01/04/2012   BUN 19 01/04/2012   CO2 32 01/04/2012   TSH 1.34 01/02/2011   HGBA1C 6.2 01/04/2012          Assessment & Plan:

## 2012-01-07 NOTE — Assessment & Plan Note (Signed)
Stable and able to meet all her needs re: independent living.

## 2012-01-07 NOTE — Assessment & Plan Note (Signed)
No recent flares. Uric acid 5.7 - normal range.

## 2012-01-07 NOTE — Assessment & Plan Note (Signed)
Last Carotid Doppler August 1, '13: 30-59% stenosis bilateral ICA, stable from previous study. F/u 1 year

## 2012-01-07 NOTE — Assessment & Plan Note (Signed)
LDL much better than goal of 100 or less in patient with diabetes and PVD; HDL at goal of 40 or higher.  Plan Continue present medications.

## 2012-01-07 NOTE — Assessment & Plan Note (Signed)
BP Readings from Last 3 Encounters:  01/04/12 132/74  12/11/11 138/76  07/06/11 142/84   Adequate control -   Plan Continue present medications.

## 2012-01-07 NOTE — Assessment & Plan Note (Signed)
Persistent intermittent, brief episodes of dysequilibrium. MRI brain negative. Suspect mild intermittent labyrinthitis.  Plan -  Care in regard to avoiding falls.

## 2012-01-07 NOTE — Assessment & Plan Note (Signed)
Interval history w/o serious medical illness or surgery. Physical exam, sans breast & pelvic, normal. Lab results are in normal range. She is current with Gyn, colorectal and breast cancer screening. Immunizations are up to date except for pneumonia vaccine.   In summary - a very nice woman who is medically stable at today's visit. She will return in 1 year or sooner as needed.

## 2012-01-07 NOTE — Assessment & Plan Note (Signed)
Lab Results  Component Value Date   HGBA1C 6.2 01/04/2012   Excellent control!  Plan Continue metformin  Continue life-style management with healthy diet and regular exercise.

## 2012-01-29 ENCOUNTER — Other Ambulatory Visit: Payer: Self-pay | Admitting: Internal Medicine

## 2012-02-16 ENCOUNTER — Telehealth: Payer: Self-pay | Admitting: Internal Medicine

## 2012-02-16 NOTE — Telephone Encounter (Signed)
Caller: [name]/Patient; Patient Name: [name]; PCP: Illene Regulus (Adults only); Best Callback Phone Number: [phone]. Patient states she has been sick with cold/congestion since Wednesday  02/07/12. She states she is some better but remains stuffy and congested.  She has been treating herself with Sugar free  Robitussion.  Slight Cough , Afebrile, no runny nose. +congested thick yellow. Occasional back pain.  Nasally sounding over phone, +ears stopped up.  Emergent s/sx ruled out per Upper Respiratory Infection with exception to Productive cough with colored sputum. See Provider in 24 hours. Appointment scheduled 02/17/12 at [facility] office with Dr. Milinda Antis at 09:30 a.m.  Caller expressed understanding. Home care advice reviewed

## 2012-02-17 ENCOUNTER — Ambulatory Visit (INDEPENDENT_AMBULATORY_CARE_PROVIDER_SITE_OTHER): Payer: Medicare Other | Admitting: Family Medicine

## 2012-02-17 ENCOUNTER — Encounter: Payer: Self-pay | Admitting: Family Medicine

## 2012-02-17 VITALS — BP 130/78 | HR 66 | Temp 97.6°F | Wt 146.0 lb

## 2012-02-17 DIAGNOSIS — J069 Acute upper respiratory infection, unspecified: Secondary | ICD-10-CM | POA: Insufficient documentation

## 2012-02-17 NOTE — Assessment & Plan Note (Signed)
No signs of bacterial infection  Disc symptomatic care - see instructions on AVS  Will watch carefully Update if not starting to improve in a week or if worsening   Reassuring exam

## 2012-02-17 NOTE — Patient Instructions (Addendum)
I think you have a viral head and chest cold with congestion  Try some plain mucinex twice daily Nasal saline spray for congestion Tylenol if headache  Lots of rest and fluids If runny nose - try zyrtec over the counter 10 mg  Update if not starting to improve in a week or if worsening

## 2012-02-17 NOTE — Progress Notes (Signed)
Subjective:    Patient ID: [name], female    DOB: [birth year], 71 y.o.   MRN: [id number]  HPI Here with uri symptoms ? If a bad head cold - or poss sinus infx  10 days of symptoms   Has improved a little but not much Bad head congestion  No ST No facial pain  Yellow nasal drainage rattly cough- cannot get mucous up   No fever   Is diabetic   Is taking tylenol as needed   Patient Active Problem List  Diagnosis  . HYPERLIPIDEMIA  . GOUT  . HYPERTENSION  . CAROTID ARTERY DISEASE  . VENOUS INSUFFICIENCY, LEGS  . ECZEMA  . SHOULDER PAIN, RIGHT  . DEGENERATIVE DISC DISEASE, LUMBOSACRAL SPINE W/RADICULOPATHY  . SCIATICA  . LEG PAIN, LEFT  . UNS ADVRS EFF UNS RX MEDICINAL&BIOLOGICAL SBSTNC  . APPENDECTOMY, LAPAROSCOPIC, HX OF  . LAMINECTOMY, LUMBAR, HX OF  . DIAB W/O MENTION COMP TYPE II/UNS TYPE UNCNTRL  . Peripheral neuropathy  . Routine health maintenance  . Balance problems  . Grief reaction  . Viral URI with cough   Past Medical History  Diagnosis Date  . Type II or unspecified type diabetes mellitus without mention of complication, uncontrolled   . Routine general medical examination at a health care facility   . Occlusion and stenosis of carotid artery without mention of cerebral infarction   . Unspecified venous (peripheral) insufficiency   . Displacement of lumbar intervertebral disc without myelopathy   . Sciatica   . Pain in limb   . Pain in joint, shoulder region   . Contact dermatitis and other eczema, due to unspecified cause   . Gout, unspecified   . Other and unspecified hyperlipidemia   . Unspecified essential hypertension    Past Surgical History  Procedure Date  . Laparoscopic appendectomy   . Tubal ligation   . Lumbar laminectomy 07/2008    [place]   History  Substance Use Topics  . Smoking status: Former Smoker    Quit date: 05/23/1995  . Smokeless tobacco: Not on file  . Alcohol Use: Not on file   Family History  Problem  Relation Age of Onset  . Alzheimer's disease Mother   . Diabetes Mother   . Coronary artery disease Father     CABG  . Hypertension Father   . Colon cancer Neg Hx   . Breast cancer Neg Hx   . Cancer Neg Hx     breast or colon   Allergies  Allergen Reactions  . Ampicillin     REACTION: causes urticaria  . Neomycin-Bacitracin Zn-Polymyx   . Penicillins     REACTION: causes urticaria   Current Outpatient Prescriptions on File Prior to Visit  Medication Sig Dispense Refill  . metFORMIN (GLUCOPHAGE) 500 MG tablet TAKE ONE TABLET BY MOUTH TWICE DAILY  60 tablet  11  . Multiple Vitamins-Minerals (CENTRUM ULTRA WOMENS) TABS Take by mouth daily.        . propranolol-hydrochlorothiazide (INDERIDE) 40-25 MG per tablet TAKE ONE TABLET BY MOUTH ONE TIME DAILY  30 tablet  9  . simvastatin (ZOCOR) 20 MG tablet TAKE ONE TABLET BY MOUTH ONE TIME DAILY  30 tablet  10        Review of Systems    Review of Systems  Constitutional: Negative for fever, appetite change,  and unexpected weight change.  ENt pos for sinus cong and rhinorrhea and ST Eyes: Negative for pain and visual disturbance.  Respiratory:  Negative for sob or wheeze Cardiovascular: Negative for cp or palpitations    Gastrointestinal: Negative for nausea, diarrhea and constipation.  Genitourinary: Negative for urgency and frequency.  Skin: Negative for pallor or rash   Neurological: Negative for weakness, light-headedness, numbness and headaches.  Hematological: Negative for adenopathy. Does not bruise/bleed easily.  Psychiatric/Behavioral: Negative for dysphoric mood. The patient is not nervous/anxious.      Objective:   Physical Exam  Constitutional: She appears well-developed and well-nourished. No distress.  HENT:  Head: Normocephalic and atraumatic.  Right Ear: External ear normal.  Left Ear: External ear normal.  Mouth/Throat: Oropharynx is clear and moist. No oropharyngeal exudate.       Nares are injected and  congested  No facial tenderness TMs are dull / no erythema   Eyes: Conjunctivae normal and EOM are normal. Pupils are equal, round, and reactive to light. Right eye exhibits no discharge. Left eye exhibits no discharge. No scleral icterus.  Neck: Normal range of motion. Neck supple.  Cardiovascular: Normal rate and regular rhythm.   Pulmonary/Chest: Effort normal and breath sounds normal. No respiratory distress. She has no wheezes. She has no rales.  Lymphadenopathy:    She has no cervical adenopathy.  Neurological: She is alert. No cranial nerve deficit.  Skin: Skin is warm and dry. No rash noted. No pallor.  Psychiatric: She has a normal mood and affect.          Assessment & Plan:

## 2012-03-12 DIAGNOSIS — D485 Neoplasm of uncertain behavior of skin: Secondary | ICD-10-CM | POA: Diagnosis not present

## 2012-03-12 DIAGNOSIS — L820 Inflamed seborrheic keratosis: Secondary | ICD-10-CM | POA: Diagnosis not present

## 2012-04-25 ENCOUNTER — Telehealth: Payer: Self-pay | Admitting: Internal Medicine

## 2012-04-25 NOTE — Telephone Encounter (Signed)
Patient Information:  Caller Name: Analis  Phone: [phone]  Patient: [name], [name]  Gender: Female  DOB: Jan 30, 1941  Age: 71 Years  PCP: Illene Regulus (Adults only)   Symptoms  Reason For Call & Symptoms: Has a nagging cough.  Denies congestion , fever, sore throat.  Reviewed Health History In EMR: Yes  Reviewed Medications In EMR: Yes  Reviewed Allergies In EMR: Yes  Reviewed Surgeries / Procedures: Yes  Date of Onset of Symptoms: 04/24/2012  Guideline(s) Used:  Cough  Disposition Per Guideline:   Home Care  Reason For Disposition Reached:   Cough with no complications  Advice Given:  Reassurance  Coughing is the way that our lungs remove irritants and mucus. It helps protect our lungs from getting pneumonia.  You can get a dry hacking cough after a chest cold. Sometimes this type of cough can last 1-3 weeks, and be worse at night.  You can also get a cough after being exposed to irritating substances like smoke, strong perfumes, and dust.  Here is some care advice that should help.  Cough Medicines:  OTC Cough Syrups: The most common cough suppressant in OTC cough medications is dextromethorphan. Often the letters "DM" appear in the name.  OTC Cough Drops: Cough drops can help a lot, especially for mild coughs. They reduce coughing by soothing your irritated throat and removing that tickle sensation in the back of the throat. Cough drops also have the advantage of portability - you can carry them with you.  Home Remedy - Hard Candy: Hard candy works just as well as medicine-flavored OTC cough drops. Diabetics should use sugar-free candy.  Home Remedy - Honey: This old home remedy has been shown to help decrease coughing at night. The adult dosage is 2 teaspoons (10 ml) at bedtime. Honey should not be given to infants under one year of age.  OTC Cough Syrup - Dextromethorphan:  Cough syrups containing the cough suppressant dextromethorphan (DM) may help decrease your  cough. Cough syrups work best for coughs that keep you awake at night. They can also sometimes help in the late stages of a respiratory infection when the cough is dry and hacking. They can be used along with cough drops.  Examples: Benylin, Robitussin DM, Vicks 44 Cough Relief  Read the package instructions for dosage, contraindications, and other important information.  Coughing Spasms:  Drink warm fluids. Inhale warm mist (Reason: both relax the airway and loosen up the phlegm).  Prevent Dehydration:  Drink adequate liquids.  Avoid Tobacco Smoke:  Smoking or being exposed to smoke makes coughs much worse.  Expected Course:   The expected course depends on what is causing the cough.  Viral bronchitis (chest cold) causes a cough that lasts 1 to 3 weeks. Sometimes you may cough up lots of phlegm (sputum, mucus). The mucus can normally be white, gray, yellow, or green.  Call Back If:  Difficulty breathing  Cough lasts more than 3 weeks  Fever lasts > 3 days  You become worse.  Office Follow Up:  Does the office need to follow up with this patient?: No  Instructions For The Office: N/A  RN Note:  Throat is scratchy from coughing; cough is non-productive-dry; Patient states that she does want to be seen if not better by 04/27/12 or 04/29/12.  Will try home care advice.

## 2012-04-27 ENCOUNTER — Ambulatory Visit (INDEPENDENT_AMBULATORY_CARE_PROVIDER_SITE_OTHER): Payer: Medicare Other | Admitting: Family

## 2012-04-27 ENCOUNTER — Encounter: Payer: Self-pay | Admitting: Family

## 2012-04-27 VITALS — BP 136/80 | HR 68 | Temp 99.6°F | Resp 15 | Wt 146.2 lb

## 2012-04-27 DIAGNOSIS — R05 Cough: Secondary | ICD-10-CM

## 2012-04-27 DIAGNOSIS — E119 Type 2 diabetes mellitus without complications: Secondary | ICD-10-CM | POA: Diagnosis not present

## 2012-04-27 DIAGNOSIS — J209 Acute bronchitis, unspecified: Secondary | ICD-10-CM | POA: Diagnosis not present

## 2012-04-27 DIAGNOSIS — R059 Cough, unspecified: Secondary | ICD-10-CM

## 2012-04-27 MED ORDER — METHYLPREDNISOLONE 4 MG PO KIT: PACK | ORAL | Status: AC

## 2012-04-27 NOTE — Patient Instructions (Signed)
Medrol Dosepak as directed. You may see high blood sugars over the next several days but should return to normal. OTC sugar-free cough med as discussed.   Bronchitis Bronchitis is the body's way of reacting to injury and/or infection (inflammation) of the bronchi. Bronchi are the air tubes that extend from the windpipe into the lungs. If the inflammation becomes severe, it may cause shortness of breath. CAUSES  Inflammation may be caused by:  A virus.  Germs (bacteria).  Dust.  Allergens.  Pollutants and many other irritants. The cells lining the bronchial tree are covered with tiny hairs (cilia). These constantly beat upward, away from the lungs, toward the mouth. This keeps the lungs free of pollutants. When these cells become too irritated and are unable to do their job, mucus begins to develop. This causes the characteristic cough of bronchitis. The cough clears the lungs when the cilia are unable to do their job. Without either of these protective mechanisms, the mucus would settle in the lungs. Then you would develop pneumonia. Smoking is a common cause of bronchitis and can contribute to pneumonia. Stopping this habit is the single most important thing you can do to help yourself. TREATMENT   Your caregiver may prescribe an antibiotic if the cough is caused by bacteria. Also, medicines that open up your airways make it easier to breathe. Your caregiver may also recommend or prescribe an expectorant. It will loosen the mucus to be coughed up. Only take over-the-counter or prescription medicines for pain, discomfort, or fever as directed by your caregiver.  Removing whatever causes the problem (smoking, for example) is critical to preventing the problem from getting worse.  Cough suppressants may be prescribed for relief of cough symptoms.  Inhaled medicines may be prescribed to help with symptoms now and to help prevent problems from returning.  For those with recurrent (chronic)  bronchitis, there may be a need for steroid medicines. SEEK IMMEDIATE MEDICAL CARE IF:   During treatment, you develop more pus-like mucus (purulent sputum).  You have a fever.  Your baby is older than 3 months with a rectal temperature of 102 F (38.9 C) or higher.  Your baby is 3 months old or younger with a rectal temperature of 100.4 F (38 C) or higher.  You become progressively more ill.  You have increased difficulty breathing, wheezing, or shortness of breath. It is necessary to seek immediate medical care if you are elderly or sick from any other disease. MAKE SURE YOU:   Understand these instructions.  Will watch your condition.  Will get help right away if you are not doing well or get worse. Document Released: 05/08/2005 Document Revised: 07/31/2011 Document Reviewed: 03/17/2008 [facility] Patient Information 2013 [place], [place].

## 2012-04-27 NOTE — Progress Notes (Signed)
Subjective:    Patient ID: [name], female    DOB: 11-01-1940, 71 y.o.   MRN: [id number]  HPI 71 year old white female, nonsmoker, patient of Dr. Wilber Bihari is in today with 2 days of cough, congestion, sinus drainage that is worsening. She has not taken any medication OTC.    Review of Systems  Constitutional: Positive for fever. Negative for chills.  HENT: Negative.   Respiratory: Positive for cough and chest tightness. Negative for wheezing.   Cardiovascular: Negative.   Gastrointestinal: Negative.   Musculoskeletal: Negative.   Skin: Negative.   Neurological: Negative.   Hematological: Negative.   Psychiatric/Behavioral: Negative.    Past Medical History  Diagnosis Date  . Type II or unspecified type diabetes mellitus without mention of complication, uncontrolled   . Routine general medical examination at a health care facility   . Occlusion and stenosis of carotid artery without mention of cerebral infarction   . Unspecified venous (peripheral) insufficiency   . Displacement of lumbar intervertebral disc without myelopathy   . Sciatica   . Pain in limb   . Pain in joint, shoulder region   . Contact dermatitis and other eczema, due to unspecified cause   . Gout, unspecified   . Other and unspecified hyperlipidemia   . Unspecified essential hypertension     History   Social History  . Marital Status: Married    Spouse Name: N/A    Number of Children: N/A  . Years of Education: N/A   Occupational History  . Not on file.   Social History Main Topics  . Smoking status: Former Smoker    Quit date: 05/23/1995  . Smokeless tobacco: Not on file  . Alcohol Use: Not on file  . Drug Use: No  . Sexually Active: Yes -- Female partner(s)   Other Topics Concern  . Not on file   Social History Narrative   HSG.  Married '59.  2 sons - '65, '62; 1 dtr '61; 7 grandchildren; 3 great-grandchildren.  Lives w/husband in own home.  END OF LIFE: she does not want to be kept  alive on machines. Needs more information in regard to CPR, etc. Provided w/Laymen's guide and MOST (7/09)    Past Surgical History  Procedure Date  . Laparoscopic appendectomy   . Tubal ligation   . Lumbar laminectomy 07/2008    [place]    Family History  Problem Relation Age of Onset  . Alzheimer's disease Mother   . Diabetes Mother   . Coronary artery disease Father     CABG  . Hypertension Father   . Colon cancer Neg Hx   . Breast cancer Neg Hx   . Cancer Neg Hx     breast or colon    Allergies  Allergen Reactions  . Ampicillin     REACTION: causes urticaria  . Neomycin-Bacitracin Zn-Polymyx   . Penicillins     REACTION: causes urticaria    Current Outpatient Prescriptions on File Prior to Visit  Medication Sig Dispense Refill  . metFORMIN (GLUCOPHAGE) 500 MG tablet TAKE ONE TABLET BY MOUTH TWICE DAILY  60 tablet  11  . Multiple Vitamins-Minerals (CENTRUM ULTRA WOMENS) TABS Take by mouth daily.        . propranolol-hydrochlorothiazide (INDERIDE) 40-25 MG per tablet TAKE ONE TABLET BY MOUTH ONE TIME DAILY  30 tablet  9  . simvastatin (ZOCOR) 20 MG tablet TAKE ONE TABLET BY MOUTH ONE TIME DAILY  30 tablet  10  BP 136/80  Pulse 68  Temp 99.6 F (37.6 C) (Oral)  Resp 15  Wt 146 lb 4 oz (66.339 kg)  SpO2 92%chart    Objective:   Physical Exam  Constitutional: She is oriented to person, place, and time. She appears well-developed and well-nourished.  HENT:  Right Ear: External ear normal.  Left Ear: External ear normal.  Nose: Nose normal.  Mouth/Throat: Oropharynx is clear and moist.  Neck: Normal range of motion. Neck supple.  Cardiovascular: Normal rate, regular rhythm and normal heart sounds.   Pulmonary/Chest: Effort normal. She has wheezes.       Mild wheezing throughout but good air movement.  Musculoskeletal: Normal range of motion.  Neurological: She is alert and oriented to person, place, and time.  Skin: Skin is warm and dry.  Psychiatric: She  has a normal mood and affect.          Assessment & Plan:  Assessment: Bronchitis, Cough  Plan: Medrol Dosepak as directed. Call the office if symptoms worsen or persist. Recheck as scheduled and as needed sooner.

## 2012-07-04 ENCOUNTER — Ambulatory Visit: Payer: Medicare Other | Admitting: Internal Medicine

## 2012-07-08 ENCOUNTER — Ambulatory Visit (INDEPENDENT_AMBULATORY_CARE_PROVIDER_SITE_OTHER): Payer: Medicare Other | Admitting: Internal Medicine

## 2012-07-08 ENCOUNTER — Encounter: Payer: Self-pay | Admitting: Internal Medicine

## 2012-07-08 VITALS — BP 132/68 | HR 82 | Temp 97.0°F | Resp 10 | Wt 149.1 lb

## 2012-07-08 DIAGNOSIS — I872 Venous insufficiency (chronic) (peripheral): Secondary | ICD-10-CM | POA: Diagnosis not present

## 2012-07-08 DIAGNOSIS — I10 Essential (primary) hypertension: Secondary | ICD-10-CM

## 2012-07-08 DIAGNOSIS — E785 Hyperlipidemia, unspecified: Secondary | ICD-10-CM

## 2012-07-08 DIAGNOSIS — IMO0001 Reserved for inherently not codable concepts without codable children: Secondary | ICD-10-CM | POA: Diagnosis not present

## 2012-07-08 NOTE — Patient Instructions (Addendum)
Diabetes - last A1C was 6.2%  Very, very good. On exam you have mild decreased vibration sensation - be sure you check your feet daily looking for any wound or cut. Plan Routine follow up lab. You will get a letter with the results  Continue your present medication.  Cholesterol - We will check lab today with recommendations to follow  Blood pressure  - good control. Will also check kidney function and electrolytes today with report and recommendation to follow  Weight - up 3 lbs since August. No too bad, but not the right trend. Plan  Smart food CHOICES, PORTION SIZE CONTROL , regular exercise              Please try to loose 6-10 lbs over the next 6 months.   You are up to date on health maintenance things.

## 2012-07-08 NOTE — Progress Notes (Signed)
Subjective:    Patient ID: [name], female    DOB: October 30, 1940, 72 y.o.   MRN: [id number]  HPI Presents for follow up of DM and lipids. In the interval since our last visit she has had a viral URI and an episode of bronchitis. She has otherwise been doing OK. She still has occasional back pain. She has put on 3 lbs.  Past Medical History  Diagnosis Date  . Type II or unspecified type diabetes mellitus without mention of complication, uncontrolled   . Routine general medical examination at a health care facility   . Occlusion and stenosis of carotid artery without mention of cerebral infarction   . Unspecified venous (peripheral) insufficiency   . Displacement of lumbar intervertebral disc without myelopathy   . Sciatica   . Pain in limb   . Pain in joint, shoulder region   . Contact dermatitis and other eczema, due to unspecified cause   . Gout, unspecified   . Other and unspecified hyperlipidemia   . Unspecified essential hypertension    Past Surgical History  Procedure Laterality Date  . Laparoscopic appendectomy    . Tubal ligation    . Lumbar laminectomy  07/2008    [place]   Family History  Problem Relation Age of Onset  . Alzheimer's disease Mother   . Diabetes Mother   . Coronary artery disease Father     CABG  . Hypertension Father   . Colon cancer Neg Hx   . Breast cancer Neg Hx   . Cancer Neg Hx     breast or colon   History   Social History  . Marital Status: Married    Spouse Name: N/A    Number of Children: N/A  . Years of Education: N/A   Occupational History  . Not on file.   Social History Main Topics  . Smoking status: Former Smoker    Quit date: 05/23/1995  . Smokeless tobacco: Not on file  . Alcohol Use: Not on file  . Drug Use: No  . Sexually Active: Yes -- Female partner(s)   Other Topics Concern  . Not on file   Social History Narrative   HSG.  Married '59.  2 sons - '65, '62; 1 dtr '61; 7 grandchildren; 3 great-grandchildren.   Lives w/husband in own home.  END OF LIFE: she does not want to be kept alive on machines. Needs more information in regard to CPR, etc. Provided w/L[employer]men's guide and MOST (7/09)    Current Outpatient Prescriptions on File Prior to Visit  Medication Sig Dispense Refill  . metFORMIN (GLUCOPHAGE) 500 MG tablet TAKE ONE TABLET BY MOUTH TWICE DAILY  60 tablet  11  . Multiple Vitamins-Minerals (CENTRUM ULTRA WOMENS) TABS Take by mouth daily.        . propranolol-hydrochlorothiazide (INDERIDE) 40-25 MG per tablet TAKE ONE TABLET BY MOUTH ONE TIME DAILY  30 tablet  9  . simvastatin (ZOCOR) 20 MG tablet TAKE ONE TABLET BY MOUTH ONE TIME DAILY  30 tablet  10   No current facility-administered medications on file prior to visit.      Review of Systems System review is negative for any constitutional, cardiac, pulmonary, GI or neuro symptoms or complaints other than as described in the HPI.      Objective:   Physical Exam Filed Vitals:   07/08/12 1528  BP: 132/68  Pulse: 82  Temp: 97 F (36.1 C)  Resp: 10   Wt Readings from Last  3 Encounters:  07/08/12 149 lb 1.9 oz (67.64 kg)  04/27/12 146 lb 4 oz (66.339 kg)  02/17/12 146 lb (66.225 kg)   Gen'l- WNWD mildly overweight white woman in no distress HEENT - C&S clear Cor- 2+ radial, RRR Pulm - normal respirations Neuor - A&O x 3; foot sensation - normal to soft touch, pin prick but decrease vibration.       Assessment & Plan:

## 2012-07-09 ENCOUNTER — Other Ambulatory Visit (INDEPENDENT_AMBULATORY_CARE_PROVIDER_SITE_OTHER): Payer: Medicare Other

## 2012-07-09 DIAGNOSIS — IMO0001 Reserved for inherently not codable concepts without codable children: Secondary | ICD-10-CM | POA: Diagnosis not present

## 2012-07-09 DIAGNOSIS — I10 Essential (primary) hypertension: Secondary | ICD-10-CM

## 2012-07-09 DIAGNOSIS — E785 Hyperlipidemia, unspecified: Secondary | ICD-10-CM | POA: Diagnosis not present

## 2012-07-09 LAB — COMPREHENSIVE METABOLIC PANEL
AST: 33 U/L (ref 0–37)
Albumin: 4 g/dL (ref 3.5–5.2)
Alkaline Phosphatase: 62 U/L (ref 39–117)
BUN: 27 mg/dL — ABNORMAL HIGH (ref 6–23)
Potassium: 4.7 mEq/L (ref 3.5–5.1)
Sodium: 140 mEq/L (ref 135–145)
Total Bilirubin: 1 mg/dL (ref 0.3–1.2)
Total Protein: 7.5 g/dL (ref 6.0–8.3)

## 2012-07-09 LAB — LIPID PANEL
HDL: 34.6 mg/dL — ABNORMAL LOW (ref >39.00)
LDL Cholesterol: 64 mg/dL (ref 0–99)
VLDL: 37.6 mg/dL (ref 0.0–40.0)

## 2012-07-09 LAB — HEPATIC FUNCTION PANEL
ALT: 46 U/L — ABNORMAL HIGH (ref 0–35)
AST: 33 U/L (ref 0–37)
Bilirubin, Direct: 0.2 mg/dL (ref 0.0–0.3)
Total Protein: 7.5 g/dL (ref 6.0–8.3)

## 2012-07-10 NOTE — Assessment & Plan Note (Signed)
She has been adherent to medical regimen but has had some dietary indiscretion.  Plan A1C with recommendations to follow.  Addendum:  Lab Results  Component Value Date   HGBA1C 6.6* 07/09/2012   Great control. No change in medication

## 2012-07-10 NOTE — Assessment & Plan Note (Signed)
Stable. She does continue to use otc support hose

## 2012-07-10 NOTE — Assessment & Plan Note (Signed)
BP Readings from Last 3 Encounters:  07/08/12 132/68  04/27/12 136/80  02/17/12 130/78   Adequate control on present medication  Plan Routine lab - Bmet

## 2012-07-14 ENCOUNTER — Encounter: Payer: Self-pay | Admitting: Internal Medicine

## 2012-08-26 ENCOUNTER — Other Ambulatory Visit: Payer: Self-pay | Admitting: Internal Medicine

## 2012-09-12 ENCOUNTER — Telehealth: Payer: Self-pay | Admitting: Internal Medicine

## 2012-09-12 NOTE — Telephone Encounter (Signed)
Patient Information:  Caller Name: [name]  Phone: [phone]  Patient: [name], [name]  Gender: Female  DOB: 04/23/1941  Age: 72 Years  PCP: Illene Regulus (Adults only)  Office Follow Up:  Does the office need to follow up with this patient?: No  Instructions For The Office: N/A  RN Note:  Afebrile.  States has left sided cheekbone and forehead pain.  Per sinus pain protocol, emergent symptoms denied; advised appt within 24 hours.  Appt scheduled 09/13/12 1100 with Ms. Sampson Si.  krs/can  Symptoms  Reason For Call & Symptoms: sinus pain and congestion; states was outdoors but feels this is a cold; has left sided cheek/toothache  Reviewed Health History In EMR: Yes  Reviewed Medications In EMR: Yes  Reviewed Allergies In EMR: Yes  Reviewed Surgeries / Procedures: Yes  Date of Onset of Symptoms: 09/11/2012  Guideline(s) Used:  Sinus Pain and Congestion  Disposition Per Guideline:   See Today or Tomorrow in Office  Reason For Disposition Reached:   Using nasal washes and pain medicine > 24 hours and sinus pain (lower forehead, cheekbone, or eye) persists  Advice Given:  Reassurance:   Sinus congestion is a normal part of a cold.  Usually home treatment with nasal washes can prevent an actual bacterial sinus infection.  For a Stuffy Nose - Use Nasal Washes:  Introduction: Saline (salt water) nasal irrigation (nasal wash) is an effective and simple home remedy for treating stuffy nose and sinus congestion. The nose can be irrigated by pouring, spraying, or squirting salt water into the nose and then letting it run back out.  How it Helps: The salt water rinses out excess mucus, washes out any irritants (dust, allergens) that might be present, and moistens the nasal cavity.  Caution - Nasal Decongestants:  Do not take these medications if you have high blood pressure, heart disease, prostate problems, or an overactive thyroid.  Pain and Fever Medicines:  For pain or fever relief,  take either acetaminophen or ibuprofen.  Pain and Fever Medicines:  For pain or fever relief, take either acetaminophen or ibuprofen.  Acetaminophen (e.g., Tylenol):  Regular Strength Tylenol: Take 650 mg (two 325 mg pills) by mouth every 4-6 hours as needed. Each Regular Strength Tylenol pill has 325 mg of acetaminophen.  Hydration:  Drink plenty of liquids (6-8 glasses of water daily). If the air in your home is dry, use a cool mist humidifier  Call Back If:   Severe pain lasts longer than 2 hours after pain medicine  You become worse.  Patient Will Follow Care Advice:  YES  Appointment Scheduled:  09/13/2012 11:00:00 Appointment Scheduled Provider:  Nicki Reaper

## 2012-09-13 ENCOUNTER — Ambulatory Visit (INDEPENDENT_AMBULATORY_CARE_PROVIDER_SITE_OTHER): Payer: Medicare Other | Admitting: Internal Medicine

## 2012-09-13 ENCOUNTER — Encounter: Payer: Self-pay | Admitting: Internal Medicine

## 2012-09-13 ENCOUNTER — Other Ambulatory Visit: Payer: Medicare Other

## 2012-09-13 VITALS — BP 146/84 | HR 72 | Temp 98.0°F | Wt 147.0 lb

## 2012-09-13 DIAGNOSIS — R300 Dysuria: Secondary | ICD-10-CM | POA: Diagnosis not present

## 2012-09-13 DIAGNOSIS — J309 Allergic rhinitis, unspecified: Secondary | ICD-10-CM | POA: Diagnosis not present

## 2012-09-13 LAB — POCT URINALYSIS DIPSTICK
Glucose, UA: NEGATIVE
Nitrite, UA: NEGATIVE
Urobilinogen, UA: 0.2

## 2012-09-13 MED ORDER — METHYLPREDNISOLONE ACETATE 80 MG/ML IJ SUSP
80.0000 mg | Freq: Once | INTRAMUSCULAR | Status: AC
  Administered 2012-09-13: 80 mg via INTRAMUSCULAR

## 2012-09-13 MED ORDER — LEVOCETIRIZINE DIHYDROCHLORIDE 5 MG PO TABS: 5.0000 mg | ORAL_TABLET | Freq: Every evening | ORAL | Status: DC

## 2012-09-13 MED ORDER — NITROFURANTOIN MONOHYD MACRO 100 MG PO CAPS: 100.0000 mg | ORAL_CAPSULE | Freq: Two times a day (BID) | ORAL | Status: DC

## 2012-09-13 NOTE — Progress Notes (Signed)
Subjective:    Patient ID: [name], female    DOB: 1941/05/16, 72 y.o.   MRN: [id number]  HPI  Pt presents to the clinic today with 4 day history of headache, nasal congestion and cough. She is not sure if she has had a fever but she does fill like she had chills. She does not produce sputum. She has no history of allergies or asthma. She is a former smoker. She has been taking robitussin and saline spray which is not helping. Additionally today, she complains of a burning sensation after she urinates. This started 2 days ago. She is not having any frequency or back pain. She denies seeing blood in her urine. She has had a UTI in the past and states this feels the same. She has not taken anything OTC for it.  Review of Systems      Past Medical History  Diagnosis Date  . Type II or unspecified type diabetes mellitus without mention of complication, uncontrolled   . Routine general medical examination at a health care facility   . Occlusion and stenosis of carotid artery without mention of cerebral infarction   . Unspecified venous (peripheral) insufficiency   . Displacement of lumbar intervertebral disc without myelopathy   . Sciatica   . Pain in limb   . Pain in joint, shoulder region   . Contact dermatitis and other eczema, due to unspecified cause   . Gout, unspecified   . Other and unspecified hyperlipidemia   . Unspecified essential hypertension     Current Outpatient Prescriptions  Medication Sig Dispense Refill  . metFORMIN (GLUCOPHAGE) 500 MG tablet TAKE 1 TABLET BY MOUTH TWICE A DAY  60 tablet  2  . Multiple Vitamins-Minerals (CENTRUM ULTRA WOMENS) TABS Take by mouth daily.        . propranolol-hydrochlorothiazide (INDERIDE) 40-25 MG per tablet TAKE ONE TABLET BY MOUTH ONE TIME DAILY  30 tablet  9  . simvastatin (ZOCOR) 20 MG tablet TAKE ONE TABLET BY MOUTH ONE TIME DAILY  30 tablet  10   No current facility-administered medications for this visit.    Allergies   Allergen Reactions  . Ampicillin     REACTION: causes urticaria  . Neomycin-Bacitracin Zn-Polymyx   . Penicillins     REACTION: causes urticaria    Family History  Problem Relation Age of Onset  . Alzheimer's disease Mother   . Diabetes Mother   . Coronary artery disease Father     CABG  . Hypertension Father   . Colon cancer Neg Hx   . Breast cancer Neg Hx   . Cancer Neg Hx     breast or colon    History   Social History  . Marital Status: Married    Spouse Name: N/A    Number of Children: N/A  . Years of Education: N/A   Occupational History  . Not on file.   Social History Main Topics  . Smoking status: Former Smoker    Quit date: 05/23/1995  . Smokeless tobacco: Not on file  . Alcohol Use: Not on file  . Drug Use: No  . Sexually Active: Yes -- Female partner(s)   Other Topics Concern  . Not on file   Social History Narrative   HSG.  Married '59.  2 sons - '65, '62; 1 dtr '61; 7 grandchildren; 3 great-grandchildren.  Lives w/husband in own home.  END OF LIFE: she does not want to be kept alive on machines. Needs  more information in regard to CPR, etc. Provided w/L[employer] guide and MOST (7/09)     Constitutional: Pt reports headache. Denies fever, malaise, fatigue or abrupt weight changes.  HEENT: Pt reports nasal congestion. Denies eye pain, eye redness, ear pain, ringing in the ears, wax buildup, runny nose, bloody nose, or sore throat. Respiratory: Pt reports cough. Denies difficulty breathing, shortness of breath, or sputum production.  .  GU: Pt reports burning sensation with urination. Denies urgency, frequency, pain with urination, blood in urine, odor or discharge.   No other specific complaints in a complete review of systems (except as listed in HPI above).  Objective:   Physical Exam   BP 146/84  Pulse 72  Temp(Src) 98 F (36.7 C) (Oral)  Wt 147 lb (66.679 kg)  BMI 26.05 kg/m2  SpO2 97% Wt Readings from Last 3 Encounters:  09/13/12 147  lb (66.679 kg)  07/08/12 149 lb 1.9 oz (67.64 kg)  04/27/12 146 lb 4 oz (66.339 kg)    General: Appears her stated age, well developed, well nourished in NAD. HEENT: Head: normal shape and size; Eyes: sclera white, no icterus, conjunctiva pink, PERRLA and EOMs intact; Ears: Tm's gray and intact, normal light reflex; Nose: mucosa boggy and moist, septum midline; Throat/Mouth: Teeth present, mucosa erythematous and moist, + PND, no exudate, lesions or ulcerations noted.  Neck: Normal range of motion. Neck supple, trachea midline. No massses, lumps or thyromegaly present.  Cardiovascular: Normal rate and rhythm. S1,S2 noted.  No murmur, rubs or gallops noted. No JVD or BLE edema. No carotid bruits noted. Pulmonary/Chest: Normal effort and positive vesicular breath sounds. No respiratory distress. No wheezes, rales or ronchi noted.  Abdomen: Soft and nontender. Normal bowel sounds, no bruits noted. No distention or masses noted. Liver, spleen and kidneys non palpable. No CVA tenderess.       Assessment & Plan:   Allergic Rhinitis, new onset:  Do salt water gargles for the sore throat Avoid triggers if you know them eRx for levocyterizine 55 mg daily 80 mg Depo IM today- sugars may be increased over the next 24 hours  Burning sensation with urination, secondary to UTI:  Urinalysis and urine culture Increase your fluid intake Erx for Macrobid BID x 5 days Pt declines rx for pyridium at this time  RTC as needed or if symptoms persist

## 2012-09-13 NOTE — Addendum Note (Signed)
Addended by: Lyanne Co R on: 09/13/2012 11:37 AM   Modules accepted: Orders

## 2012-09-13 NOTE — Addendum Note (Signed)
Addended by: Lyanne Co R on: 09/13/2012 01:29 PM   Modules accepted: Orders

## 2012-09-13 NOTE — Patient Instructions (Signed)
Allergic Rhinitis  Allergic rhinitis is when the mucous membranes in the nose respond to allergens. Allergens are particles in the air that cause your body to have an allergic reaction. This causes you to release allergic antibodies. Through a chain of events, these eventually cause you to release histamine into the blood stream (hence the use of antihistamines). Although meant to be protective to the body, it is this release that causes your discomfort, such as frequent sneezing, congestion and an itchy runny nose.    CAUSES    The pollen allergens may come from grasses, trees, and weeds. This is seasonal allergic rhinitis, or "hay fever." Other allergens cause year-round allergic rhinitis (perennial allergic rhinitis) such as house dust mite allergen, pet dander and mold spores.    SYMPTOMS     Nasal stuffiness (congestion).   Runny, itchy nose with sneezing and tearing of the eyes.   There is often an itching of the mouth, eyes and ears.  It cannot be cured, but it can be controlled with medications.  DIAGNOSIS    If you are unable to determine the offending allergen, skin or blood testing may find it.  TREATMENT     Avoid the allergen.   Medications and allergy shots (immunotherapy) can help.   Hay fever may often be treated with antihistamines in pill or nasal spray forms. Antihistamines block the effects of histamine. There are over-the-counter medicines that may help with nasal congestion and swelling around the eyes. Check with your caregiver before taking or giving this medicine.  If the treatment above does not work, there are many new medications your caregiver can prescribe. Stronger medications may be used if initial measures are ineffective. Desensitizing injections can be used if medications and avoidance fails. Desensitization is when a patient is given ongoing shots until the body becomes less sensitive to the allergen. Make sure you follow up with your caregiver if problems continue.   SEEK MEDICAL CARE IF:     You develop fever (more than 100.5 F (38.1 C).   You develop a cough that does not stop easily (persistent).   You have shortness of breath.   You start wheezing.   Symptoms interfere with normal daily activities.  Document Released: 01/31/2001 Document Revised: 07/31/2011 Document Reviewed: 08/12/2008  Exit[employer] Patient Information 2013 [employer]

## 2012-09-15 LAB — URINE CULTURE

## 2012-09-16 ENCOUNTER — Other Ambulatory Visit: Payer: Self-pay | Admitting: Internal Medicine

## 2012-09-16 ENCOUNTER — Telehealth: Payer: Self-pay

## 2012-09-16 ENCOUNTER — Telehealth: Payer: Self-pay | Admitting: Internal Medicine

## 2012-09-16 DIAGNOSIS — N390 Urinary tract infection, site not specified: Secondary | ICD-10-CM

## 2012-09-16 MED ORDER — CIPROFLOXACIN HCL 500 MG PO TABS: 500.0000 mg | ORAL_TABLET | Freq: Two times a day (BID) | ORAL | Status: DC

## 2012-09-16 NOTE — Telephone Encounter (Signed)
Pt notified to stop Macrobid and start Cipro

## 2012-09-16 NOTE — Telephone Encounter (Signed)
Patient Information:  Caller Name: Yorley  Phone: [phone]  Patient: [name], [name]  Gender: Female  DOB: [birth year]  Age: 72 Years  PCP: Illene Regulus (Adults only)  Office Follow Up:  Does the office need to follow up with this patient?: No  Instructions For The Office: N/A  RN Note:  Medication questions regarding the medication she was put on Friday, 09/13/2012, Macrobid and now today, 09/16/2012, the office called in Ciprofloxacin and she is confused if she should be taking both medications. RN/CAN advised, per Epic charting 9:00am 09/16/2012, to stop the Macrobid and start the Cipro. She is also confused on what Levocetirizine is. RN/CAN informed her that is an allergy medicine. RN/CAN suggested she take a bold marker and cross an X out of the Macrobid so she does not make a mistake.  Symptoms  Reason For Call & Symptoms: Kidney Nitrofurantoin Mono NCR medication Friday. Today,  Ciprofloxacin was called in and she is confused if she should continue the Macrobid.  Reviewed Health History In EMR: Yes  Reviewed Medications In EMR: Yes  Reviewed Allergies In EMR: Yes  Reviewed Surgeries / Procedures: Yes  Date of Onset of Symptoms: 09/16/2012  Guideline(s) Used:  No Protocol Available - Information Only  Disposition Per Guideline:   Home Care  Reason For Disposition Reached:   Information only question and nurse able to answer  Advice Given:  N/A  Patient Will Follow Care Advice:  YES

## 2012-09-16 NOTE — Telephone Encounter (Signed)
Message copied by Noreene Larsson on Mon Sep 16, 2012  8:58 AM ------      Message from: Lorre Munroe      Created: Mon Sep 16, 2012  8:05 AM       Georgiann Hahn,      Can you please call Mrs. Alleyne and let her know that we have the urine culture back. The antibiotic prescribed at the office visit does not completely cover the type of bacteria that is growing in her urine culture. I need her to stop the Macrobid and I have called in 5 days of Cipro to her pharmacy.      Regina ------

## 2012-09-18 ENCOUNTER — Telehealth: Payer: Self-pay | Admitting: Internal Medicine

## 2012-09-18 NOTE — Telephone Encounter (Signed)
[facility] Triage Call Report Triage Record Num: [id number] Operator: Donnella Sham Patient Name: [name] Call Date & Time: 09/17/2012 5:11:30PM Patient Phone: [phone] PCP: Illene Regulus Patient Gender: Female PCP Fax : [phone] Patient DOB: Sep 25, 1940 Practice Name[facility] Reason for Call: Caller: Kymberlie/Patient; PCP: Illene Regulus (Adults only); CB#: [phone]; Call regarding Medication Issue; says she started on Cipro 09/17/12 after her culture came back on her urine; says she took the med fine this am, but said she was having some abdominal discomfort this evening after taking the med; said she took the med on an empty stomach; med is BID; advised to call office back in the am 4/30 prior to taking the med to see if Dr.Norins wanted to change the med or if he felt she should try taking it after eating Protocol(s) Used: Office Note Recommended Outcome per Protocol: Information Noted and Sent to Office Reason for Outcome: Caller information to office

## 2012-09-18 NOTE — Telephone Encounter (Signed)
Continue Cipro with food - If still unable to tolerate medication for the prescribed 5 days, call and we will change to alternate antibiotic (septra) thanks

## 2012-09-18 NOTE — Telephone Encounter (Signed)
Pt notified and will call back if can not tolerate the full 5 days.

## 2012-10-23 DIAGNOSIS — E119 Type 2 diabetes mellitus without complications: Secondary | ICD-10-CM | POA: Diagnosis not present

## 2012-10-28 ENCOUNTER — Encounter: Payer: Self-pay | Admitting: Internal Medicine

## 2012-11-05 DIAGNOSIS — Z1231 Encounter for screening mammogram for malignant neoplasm of breast: Secondary | ICD-10-CM | POA: Diagnosis not present

## 2012-11-07 ENCOUNTER — Other Ambulatory Visit: Payer: Self-pay | Admitting: Internal Medicine

## 2012-11-25 ENCOUNTER — Ambulatory Visit (INDEPENDENT_AMBULATORY_CARE_PROVIDER_SITE_OTHER): Payer: Medicare Other | Admitting: Internal Medicine

## 2012-11-25 ENCOUNTER — Other Ambulatory Visit (INDEPENDENT_AMBULATORY_CARE_PROVIDER_SITE_OTHER): Payer: Medicare Other

## 2012-11-25 ENCOUNTER — Encounter: Payer: Self-pay | Admitting: Internal Medicine

## 2012-11-25 VITALS — BP 152/82 | HR 63 | Temp 97.8°F | Ht 62.0 in | Wt 147.0 lb

## 2012-11-25 DIAGNOSIS — N390 Urinary tract infection, site not specified: Secondary | ICD-10-CM

## 2012-11-25 DIAGNOSIS — IMO0002 Reserved for concepts with insufficient information to code with codable children: Secondary | ICD-10-CM

## 2012-11-25 LAB — URINALYSIS, ROUTINE W REFLEX MICROSCOPIC
Bilirubin Urine: NEGATIVE
Nitrite: NEGATIVE
Specific Gravity, Urine: 1.02 (ref 1.000–1.030)
pH: 6 (ref 5.0–8.0)

## 2012-11-25 MED ORDER — DOXYCYCLINE HYCLATE 100 MG PO TABS: 100.0000 mg | ORAL_TABLET | Freq: Two times a day (BID) | ORAL | Status: DC

## 2012-11-25 NOTE — Progress Notes (Signed)
  Subjective:    Patient ID: [name], female    DOB: January 30, 1941, 72 y.o.   MRN: [id number]  HPI Mrs. Izola Price had scheduled an appointment for follow up of U/A - her symptoms have resolved but she wants to be sure there is no residual infection.  In the interim she had hymenoptra sting right inner wrist and subsequently developed macular erythema ascending the arm about 8 cm with heat and pain.   PMH, FamHx and SocHx reviewed for any changes and relevance. Current Outpatient Prescriptions on File Prior to Visit  Medication Sig Dispense Refill  . ciprofloxacin (CIPRO) 500 MG tablet Take 1 tablet (500 mg total) by mouth 2 (two) times daily.  10 tablet  2  . levocetirizine (XYZAL) 5 MG tablet Take 1 tablet (5 mg total) by mouth every evening.  30 tablet  0  . metFORMIN (GLUCOPHAGE) 500 MG tablet TAKE 1 TABLET BY MOUTH TWICE A DAY  60 tablet  1  . Multiple Vitamins-Minerals (CENTRUM ULTRA WOMENS) TABS Take by mouth daily.        . nitrofurantoin, macrocrystal-monohydrate, (MACROBID) 100 MG capsule Take 1 capsule (100 mg total) by mouth 2 (two) times daily.  10 capsule  0  . propranolol-hydrochlorothiazide (INDERIDE) 40-25 MG per tablet TAKE 1 TABLET BY MOUTH EVERY DAY  30 tablet  4  . simvastatin (ZOCOR) 20 MG tablet TAKE ONE TABLET BY MOUTH ONE TIME DAILY  30 tablet  10   No current facility-administered medications on file prior to visit.      Review of Systems System review is negative for any constitutional, cardiac, pulmonary, GI or neuro symptoms or complaints other than as described in the HPI.     Objective:   Physical Exam Filed Vitals:   11/25/12 1157  BP: 152/82  Pulse: 63  Temp: 97.8 F (36.6 C)   Gen'l WNWD white woman in no distress HEENT - Clear Cor- RRR  Pulm - clear to A&P Abd - no tenderness to deep suprapubic palpation Derm - erythematous macular, hot rash inner aspect distal right UE.       Assessment & Plan:  1. U/A - symptoms have  resolved Plan U/a  2. Cellulitis - right forearm. Portal of entry was sting injury Plan Doxycycline 100 mg bid x 7 days.

## 2012-11-25 NOTE — Patient Instructions (Addendum)
1. UTI - will check urinalysis today to insure cure  2. Cellulitis of the right forearm - portal of bacterial entry was the sting injury.  Plan See instruction sheet  Doxycycline 100 mg twice a day for 7 days.   Cellulitis Cellulitis is an infection of the skin and the tissue beneath it. The infected area is usually red and tender. Cellulitis occurs most often in the arms and lower legs.  CAUSES  Cellulitis is caused by bacteria that enter the skin through cracks or cuts in the skin. The most common types of bacteria that cause cellulitis are Staphylococcus and Streptococcus. SYMPTOMS   Redness and warmth.  Swelling.  Tenderness or pain.  Fever. DIAGNOSIS  Your caregiver can usually determine what is wrong based on a physical exam. Blood tests may also be done. TREATMENT  Treatment usually involves taking an antibiotic medicine. HOME CARE INSTRUCTIONS   Take your antibiotics as directed. Finish them even if you start to feel better.  Keep the infected arm or leg elevated to reduce swelling.  Apply a warm cloth to the affected area up to 4 times per day to relieve pain.  Only take over-the-counter or prescription medicines for pain, discomfort, or fever as directed by your caregiver.  Keep all follow-up appointments as directed by your caregiver. SEEK MEDICAL CARE IF:   You notice red streaks coming from the infected area.  Your red area gets larger or turns dark in color.  Your bone or joint underneath the infected area becomes painful after the skin has healed.  Your infection returns in the same area or another area.  You notice a swollen bump in the infected area.  You develop new symptoms. SEEK IMMEDIATE MEDICAL CARE IF:   You have a fever.  You feel very sleepy.  You develop vomiting or diarrhea.  You have a general ill feeling (malaise) with muscle aches and pains. MAKE SURE YOU:   Understand these instructions.  Will watch your condition.  Will  get help right away if you are not doing well or get worse. Document Released: 02/15/2005 Document Revised: 11/07/2011 Document Reviewed: 07/24/2011 [facility] Patient Information 2014 [place], [place].

## 2012-11-29 ENCOUNTER — Telehealth: Payer: Self-pay | Admitting: *Deleted

## 2012-11-29 NOTE — Telephone Encounter (Signed)
Pt called requesting a Rx for itching, states her right forearm itches at the bee sting site.  Please advise

## 2012-12-02 NOTE — Telephone Encounter (Signed)
Spoke with pt advised pt of MDs message 

## 2012-12-02 NOTE — Telephone Encounter (Signed)
Sorry I didn't see this until this AM  OK for otc claritin 10 mg bid for itching and  otc Ranitidine 150 mg bid for itching  For lack of improvement OV

## 2012-12-24 ENCOUNTER — Encounter (INDEPENDENT_AMBULATORY_CARE_PROVIDER_SITE_OTHER): Payer: Medicare Other

## 2012-12-24 DIAGNOSIS — I6529 Occlusion and stenosis of unspecified carotid artery: Secondary | ICD-10-CM

## 2012-12-27 ENCOUNTER — Encounter: Payer: Self-pay | Admitting: Internal Medicine

## 2013-01-08 DIAGNOSIS — L821 Other seborrheic keratosis: Secondary | ICD-10-CM | POA: Diagnosis not present

## 2013-01-13 ENCOUNTER — Ambulatory Visit (INDEPENDENT_AMBULATORY_CARE_PROVIDER_SITE_OTHER): Payer: Medicare Other | Admitting: Internal Medicine

## 2013-01-13 ENCOUNTER — Other Ambulatory Visit (INDEPENDENT_AMBULATORY_CARE_PROVIDER_SITE_OTHER): Payer: Medicare Other

## 2013-01-13 ENCOUNTER — Encounter: Payer: Self-pay | Admitting: Internal Medicine

## 2013-01-13 ENCOUNTER — Ambulatory Visit (INDEPENDENT_AMBULATORY_CARE_PROVIDER_SITE_OTHER)
Admission: RE | Admit: 2013-01-13 | Discharge: 2013-01-13 | Disposition: A | Discharge status: Home / Self Care | Payer: Medicare Other | Source: Ambulatory Visit | Attending: Internal Medicine | Admitting: Internal Medicine

## 2013-01-13 VITALS — BP 136/74 | HR 59 | Temp 98.2°F | Wt 148.4 lb

## 2013-01-13 DIAGNOSIS — I10 Essential (primary) hypertension: Secondary | ICD-10-CM

## 2013-01-13 DIAGNOSIS — M109 Gout, unspecified: Secondary | ICD-10-CM | POA: Diagnosis not present

## 2013-01-13 DIAGNOSIS — I6529 Occlusion and stenosis of unspecified carotid artery: Secondary | ICD-10-CM | POA: Diagnosis not present

## 2013-01-13 DIAGNOSIS — Z13820 Encounter for screening for osteoporosis: Secondary | ICD-10-CM

## 2013-01-13 DIAGNOSIS — IMO0001 Reserved for inherently not codable concepts without codable children: Secondary | ICD-10-CM | POA: Diagnosis not present

## 2013-01-13 DIAGNOSIS — M5126 Other intervertebral disc displacement, lumbar region: Secondary | ICD-10-CM

## 2013-01-13 DIAGNOSIS — R2689 Other abnormalities of gait and mobility: Secondary | ICD-10-CM

## 2013-01-13 DIAGNOSIS — R29818 Other symptoms and signs involving the nervous system: Secondary | ICD-10-CM

## 2013-01-13 DIAGNOSIS — E785 Hyperlipidemia, unspecified: Secondary | ICD-10-CM

## 2013-01-13 DIAGNOSIS — G609 Hereditary and idiopathic neuropathy, unspecified: Secondary | ICD-10-CM | POA: Diagnosis not present

## 2013-01-13 DIAGNOSIS — Z0000 Encounter for general adult medical examination without abnormal findings: Secondary | ICD-10-CM

## 2013-01-13 DIAGNOSIS — I872 Venous insufficiency (chronic) (peripheral): Secondary | ICD-10-CM | POA: Diagnosis not present

## 2013-01-13 DIAGNOSIS — G629 Polyneuropathy, unspecified: Secondary | ICD-10-CM

## 2013-01-13 LAB — COMPREHENSIVE METABOLIC PANEL
AST: 25 U/L (ref 0–37)
BUN: 14 mg/dL (ref 6–23)
CO2: 31 mEq/L (ref 19–32)
Calcium: 9.9 mg/dL (ref 8.4–10.5)
Chloride: 101 mEq/L (ref 96–112)
Creatinine, Ser: 0.7 mg/dL (ref 0.4–1.2)
GFR: 93.25 mL/min (ref >60.00)
Total Bilirubin: 1 mg/dL (ref 0.3–1.2)

## 2013-01-13 NOTE — Progress Notes (Signed)
Subjective:     Patient ID: [name], female   DOB: 1941-04-27, 72 y.o.   MRN: [id number]  HPI The patient is here for annual Medicare wellness examination and management of other chronic and acute problems.  She had some minor congestion in the interim, but saw someone (she cannot remember the identity) and it has resolved with medication (which she cannot identify).   The risk factors are reflected in the social history.  The roster of all physicians providing medical care to patient - is listed in the Snapshot section of the chart.  Activities of daily living:  The patient is 100% independent in all ADLs: dressing, toileting, feeding as well as independent mobility  Home safety : The patient has smoke detectors in the home. They wear seatbelts. Firearms are present in the home, kept in a safe fashion. There is no violence in the home.   There is no risks for hepatitis, STDs or HIV. There is no history of blood transfusion. They have no travel history to infectious disease endemic areas of the world.  The patient has seen their dentist in the last six month. They have seen their eye doctor (Dr. Blima Ledger) in the last year. They deny any hearing difficulty and have not had audiologic testing in the last year.  They do not have excessive sun exposure. Discussed the need for sun protection: hats, long sleeves and use of sunscreen if there is significant sun exposure.   Diet: the importance of a healthy diet is discussed. They do have a healthy diet.  The patient has a regular exercise program: AHOY (aerobics) class four times per week, plus walking at the gym 2-3 times per week.  The benefits of regular aerobic exercise were discussed.  Depression screen: there are no signs or vegative symptoms of depression- irritability, change in appetite, anhedonia, sadness/tearfullness.  Cognitive assessment: the patient manages all their financial and personal affairs and is actively engaged. They  could relate day and year (not the date) and events; recalled 3/3 objects at 3 minutes; performed clock-face test normally.  The following portions of the patient's history were reviewed and updated as appropriate: allergies, current medications, past family history, past medical history,  past surgical history, past social history  and problem list.  Vision, hearing, body mass index were assessed and reviewed.   During the course of the visit the patient was educated and counseled about appropriate screening and preventive services including: fall prevention , diabetes screening, nutrition counseling, colorectal cancer screening, and recommended immunizations.  Sx of hyperglycemia - none Sx of hypoglycemia - none  Patient Active Problem List   Diagnosis Date Noted   Viral URI with cough 02/17/2012   Balance problems - denies 12/11/2011   Routine health maintenance 01/03/2011   Peripheral neuropathy -  08/23/2010    Class: Diagnosis of   DIAB W/O MENTION COMP TYPE II/UNS TYPE UNCNTRL 07/21/2010   CAROTID ARTERY DISEASE - [place] recently done 12/15/2009   VENOUS INSUFFICIENCY, LEGS - well managed 11/15/2009   DEGENERATIVE DISC DISEASE, LUMBOSACRAL SPINE W/RADICULOPATHY - no complaints 12/29/2008   SCIATICA 07/08/2008   LEG PAIN, LEFT 05/04/2008   SHOULDER PAIN, RIGHT - no  12/20/2007   UNS ADVRS EFF UNS RX MEDICINAL&BIOLOGICAL SBSTNC 12/20/2007   HYPERLIPIDEMIA 02/16/2007   GOUT - no complaints of recent flares 02/16/2007   HYPERTENSION 02/16/2007   ECZEMA 02/16/2007      Review of Systems Constitutional: no chills, shakes, diaphoresis, shivers HENT: no changes  in vision/hearing CV: no chest pain, or palpitations Pulm: negative for SOB, coughing or choking GI: negative for difficulty swallowing, N/V/C/D Neuro: no complaints of dizziness, fatigue or weakness.     Objective:   Physical Exam General: 72 year-old lady in NAD HENT: PERRL, EOMI, CN2-12 bilaterally  intact CV: RRR; nl S1/S2; minor carotid bruit appreciable to auscultation; no murmurs, rubs or gallops Pulm: lungs bilaterally clear to auscultation GI: soft, non-tender abdomen; bowel sounds bilaterally present Neuro: A&Ox3.  Negative for Romberg sign or Pronator drift.  Lab Results  Component Value Date   HGBA1C 6.6* 07/09/2012   Lipid Panel     Component Value Date/Time   CHOL 136 07/09/2012 0830   TRIG 188.0* 07/09/2012 0830   HDL 34.60* 07/09/2012 0830   CHOLHDL 4 07/09/2012 0830   VLDL 37.6 07/09/2012 0830   LDLCALC 64 07/09/2012 0830       Assessment:         Plan:

## 2013-01-13 NOTE — Assessment & Plan Note (Addendum)
[name] says that she received her shingles vaccine here. Plan: [name] will search for a record.  DEXA scan unable to be located. Plan: She will schedule a DEXA scan today.   Specific plan pending results

## 2013-01-13 NOTE — Assessment & Plan Note (Addendum)
Hemoglobin A1C was at goal in February.  We are optimistic that it remains under good control. Lab Results  Component Value Date   HGBA1C 6.6* 07/09/2012   Lab Results  Component Value Date   HGBA1C 6.6* 01/13/2013     Plan: recheck A1C today. A1C is at goal. Continue current treatment regimen.

## 2013-01-13 NOTE — Assessment & Plan Note (Signed)
Mild loss of vibration, fine touch and pinprick sensation bilaterally. Plan: continue to control diabetes to prevent progression. Follow-up in six months.

## 2013-01-13 NOTE — Assessment & Plan Note (Signed)
Bruit was detected by auscultation. [name] will follow up with cardiology as per their instructions.

## 2013-01-13 NOTE — Assessment & Plan Note (Addendum)
BP Readings from Last 3 Encounters:  01/13/13 136/74  11/25/12 152/82  09/13/12 146/84   This is well-managed on the current regimen (propranolol-HCTZ [Inderide]) Plan: continue the current regimen as instructed. Follow up in six months

## 2013-01-13 NOTE — Assessment & Plan Note (Signed)
This is well-controlled on the current treatment regimen. Lipid Panel     Component Value Date/Time   CHOL 136 07/09/2012 0830   TRIG 188.0* 07/09/2012 0830   HDL 34.60* 07/09/2012 0830   CHOLHDL 4 07/09/2012 0830   VLDL 37.6 07/09/2012 0830   LDLCALC 64 07/09/2012 0830    Plan: continue simvastatin (Zocor) as directed

## 2013-01-13 NOTE — Assessment & Plan Note (Signed)
Stable. This did not bother her today. Negative for Romberg sign and pronator drift. Plan: follow-up in six months, sooner if this is problematic.

## 2013-01-13 NOTE — Assessment & Plan Note (Addendum)
Stable. Plan: Check uric acid today Follow up in one year, sooner if this becomes symptomatic  Uric Acid = 6.5 (normal)

## 2013-01-13 NOTE — Patient Instructions (Addendum)
Thanks for working with me [name]) today!  Your history and physical sound healthy.  All things considered, it sounds like you're doing pretty well.  Diabetes: this was well-controlled at your last visit in February.  Your goal is 6.5-7.0 and you were 6.6 Lab Results  Component Value Date   HGBA1C 6.6* 07/09/2012  Plan: keep taking the metformin. Keep up the exercise. Your weight loss goal (13 pounds) is good and would help you control your diabetes. We will mail you the results from your lab work today. Please follow-up in six months!  High cholesterol: This is well-managed on your current regimen (Simvastatin). Plan; continue simvastatin as directed. Please follow-up in six months!  Carotid artery stenosis: please continue to have this monitored by the cardiologist. Plan: follow-up as directed.

## 2013-01-13 NOTE — Assessment & Plan Note (Signed)
Stable.  This does not bother her at the present.

## 2013-01-15 ENCOUNTER — Other Ambulatory Visit: Payer: Self-pay | Admitting: Internal Medicine

## 2013-01-21 ENCOUNTER — Encounter: Payer: Self-pay | Admitting: Internal Medicine

## 2013-01-24 ENCOUNTER — Encounter: Payer: Self-pay | Admitting: Internal Medicine

## 2013-02-05 ENCOUNTER — Other Ambulatory Visit: Payer: Self-pay | Admitting: Internal Medicine

## 2013-03-11 ENCOUNTER — Ambulatory Visit (INDEPENDENT_AMBULATORY_CARE_PROVIDER_SITE_OTHER): Payer: Medicare Other

## 2013-03-11 DIAGNOSIS — Z23 Encounter for immunization: Secondary | ICD-10-CM | POA: Diagnosis not present

## 2013-03-13 DIAGNOSIS — L259 Unspecified contact dermatitis, unspecified cause: Secondary | ICD-10-CM | POA: Diagnosis not present

## 2013-03-13 DIAGNOSIS — D485 Neoplasm of uncertain behavior of skin: Secondary | ICD-10-CM | POA: Diagnosis not present

## 2013-04-14 ENCOUNTER — Encounter: Payer: Self-pay | Admitting: Internal Medicine

## 2013-04-14 ENCOUNTER — Ambulatory Visit (INDEPENDENT_AMBULATORY_CARE_PROVIDER_SITE_OTHER): Payer: Medicare Other | Admitting: Internal Medicine

## 2013-04-14 VITALS — BP 120/72 | HR 61 | Temp 98.0°F | Wt 146.0 lb

## 2013-04-14 DIAGNOSIS — J069 Acute upper respiratory infection, unspecified: Secondary | ICD-10-CM

## 2013-04-14 MED ORDER — BENZONATATE 100 MG PO CAPS: 100.0000 mg | ORAL_CAPSULE | Freq: Two times a day (BID) | ORAL | Status: DC | PRN

## 2013-04-14 MED ORDER — LORATADINE 10 MG PO TABS: 10.0000 mg | ORAL_TABLET | Freq: Every day | ORAL | Status: DC | PRN

## 2013-04-14 NOTE — Progress Notes (Signed)
Subjective:    Patient ID: [name], female    DOB: 1940/09/11, 72 y.o.   MRN: [id number]  URI  This is a new problem. The current episode started in the past 7 days. The problem has been gradually improving. There has been no fever. Associated symptoms include congestion, coughing, headaches and rhinorrhea. Pertinent negatives include no abdominal pain, chest pain, diarrhea, nausea, neck pain, plugged ear sensation, sinus pain, sore throat, swollen glands or wheezing. She has tried decongestant for the symptoms. The treatment provided moderate relief.   Past Medical History  Diagnosis Date  . Type II or unspecified type diabetes mellitus without mention of complication, uncontrolled   . Routine general medical examination at a health care facility   . Occlusion and stenosis of carotid artery without mention of cerebral infarction   . Unspecified venous (peripheral) insufficiency   . Displacement of lumbar intervertebral disc without myelopathy   . Sciatica   . Pain in limb   . Pain in joint, shoulder region   . Contact dermatitis and other eczema, due to unspecified cause   . Gout, unspecified   . Other and unspecified hyperlipidemia   . Unspecified essential hypertension      Review of Systems  Constitutional: Negative for fever, chills, activity change and appetite change.  HENT: Positive for congestion and rhinorrhea. Negative for sinus pressure, sore throat and trouble swallowing.   Respiratory: Positive for cough. Negative for chest tightness, shortness of breath and wheezing.   Cardiovascular: Negative for chest pain.  Gastrointestinal: Negative for nausea, abdominal pain and diarrhea.  Musculoskeletal: Negative for neck pain.  Neurological: Positive for headaches. Negative for dizziness.       Objective:   Physical Exam  Constitutional: She is oriented to person, place, and time. She appears well-developed and well-nourished. No distress.  HENT:  Head: Normocephalic  and atraumatic.  Right Ear: External ear normal.  Left Ear: External ear normal.  Nose: Nose normal.  Mouth/Throat: Oropharynx is clear and moist. No oropharyngeal exudate.  Eyes: Conjunctivae and EOM are normal. Pupils are equal, round, and reactive to light. Right eye exhibits no discharge. Left eye exhibits no discharge. No scleral icterus.  Neck: Normal range of motion. Neck supple. No thyromegaly present.  Cardiovascular: Normal rate, regular rhythm and normal heart sounds.   No murmur heard. Pulmonary/Chest: Effort normal and breath sounds normal. No respiratory distress. She has no wheezes.  Musculoskeletal: Normal range of motion.  Lymphadenopathy:    She has no cervical adenopathy.  Neurological: She is alert and oriented to person, place, and time.  Skin: Skin is warm and dry. No rash noted. She is not diaphoretic.  Psychiatric: She has a normal mood and affect. Her behavior is normal. Judgment and thought content normal.    Wt Readings from Last 3 Encounters:  04/14/13 146 lb (66.225 kg)  01/13/13 148 lb 6.4 oz (67.314 kg)  11/25/12 147 lb (66.679 kg)   BP Readings from Last 3 Encounters:  04/14/13 120/72  01/13/13 136/74  11/25/12 152/82   Lab Results  Component Value Date   WBC 9.1 01/02/2011   HGB 14.2 01/02/2011   HCT 42.4 01/02/2011   PLT 331.0 01/02/2011   GLUCOSE 101* 01/13/2013   CHOL 136 07/09/2012   TRIG 188.0* 07/09/2012   HDL 34.60* 07/09/2012   LDLDIRECT 81.6 12/31/2009   LDLCALC 64 07/09/2012   ALT 26 01/13/2013   AST 25 01/13/2013   NA 137 01/13/2013   K 4.3 01/13/2013  CL 101 01/13/2013   CREATININE 0.7 01/13/2013   BUN 14 01/13/2013   CO2 31 01/13/2013   TSH 1.34 01/02/2011   HGBA1C 6.6* 01/13/2013       Assessment & Plan:   Acute viral URI with cough symptomatic sinusitis -  pt states symptoms are improving today.  Will continue with supportive care to include daily antihistamine and Tessalon perles for cough.  No indication of infection currently.   Pt to call back if symptoms worsen/do not improve in next 3-4 days.

## 2013-04-14 NOTE — Progress Notes (Signed)
Pre-visit discussion using our clinic review tool. No additional management support is needed unless otherwise documented below in the visit note.  

## 2013-04-14 NOTE — Patient Instructions (Addendum)
It was good to see you today.  If you develop worsening symptoms or fever, call and we can reconsider antibiotics, but it does not appear necessary to use antibiotics at this time.  Use tessalon for couugh and claritin for sinus/congestion as discussed for symptoms - Your prescription(s) have been submitted to your pharmacy. Please take as directed and contact our office if you believe you are having problem(s) with the medication(s).  Also okay to continue current medications as ongoing for symptom management  Upper Respiratory Infection, Adult An upper respiratory infection (URI) is also sometimes known as the common cold. The upper respiratory tract includes the nose, sinuses, throat, trachea, and bronchi. Bronchi are the airways leading to the lungs. Most people improve within 1 week, but symptoms can last up to 2 weeks. A residual cough may last even longer.  CAUSES Many different viruses can infect the tissues lining the upper respiratory tract. The tissues become irritated and inflamed and often become very moist. Mucus production is also common. A cold is contagious. You can easily spread the virus to others by oral contact. This includes kissing, sharing a glass, coughing, or sneezing. Touching your mouth or nose and then touching a surface, which is then touched by another person, can also spread the virus. SYMPTOMS  Symptoms typically develop 1 to 3 days after you come in contact with a cold virus. Symptoms vary from person to person. They may include:  Runny nose.  Sneezing.  Nasal congestion.  Sinus irritation.  Sore throat.  Loss of voice (laryngitis).  Cough.  Fatigue.  Muscle aches.  Loss of appetite.  Headache.  Low-grade fever. DIAGNOSIS  You might diagnose your own cold based on familiar symptoms, since most people get a cold 2 to 3 times a year. Your caregiver can confirm this based on your exam. Most importantly, your caregiver can check that your symptoms  are not due to another disease such as strep throat, sinusitis, pneumonia, asthma, or epiglottitis. Blood tests, throat tests, and X-rays are not necessary to diagnose a common cold, but they may sometimes be helpful in excluding other more serious diseases. Your caregiver will decide if any further tests are required. RISKS AND COMPLICATIONS  You may be at risk for a more severe case of the common cold if you smoke cigarettes, have chronic heart disease (such as heart failure) or lung disease (such as asthma), or if you have a weakened immune system. The very young and very old are also at risk for more serious infections. Bacterial sinusitis, middle ear infections, and bacterial pneumonia can complicate the common cold. The common cold can worsen asthma and chronic obstructive pulmonary disease (COPD). Sometimes, these complications can require emergency medical care and may be life-threatening. PREVENTION  The best way to protect against getting a cold is to practice good hygiene. Avoid oral or hand contact with people with cold symptoms. Wash your hands often if contact occurs. There is no clear evidence that vitamin C, vitamin E, echinacea, or exercise reduces the chance of developing a cold. However, it is always recommended to get plenty of rest and practice good nutrition. TREATMENT  Treatment is directed at relieving symptoms. There is no cure. Antibiotics are not effective, because the infection is caused by a virus, not by bacteria. Treatment may include:  Increased fluid intake. Sports drinks offer valuable electrolytes, sugars, and fluids.  Breathing heated mist or steam (vaporizer or shower).  Eating chicken soup or other clear broths, and maintaining good  nutrition.  Getting plenty of rest.  Using gargles or lozenges for comfort.  Controlling fevers with ibuprofen or acetaminophen as directed by your caregiver.  Increasing usage of your inhaler if you have asthma. Zinc gel and  zinc lozenges, taken in the first 24 hours of the common cold, can shorten the duration and lessen the severity of symptoms. Pain medicines may help with fever, muscle aches, and throat pain. A variety of non-prescription medicines are available to treat congestion and runny nose. Your caregiver can make recommendations and may suggest nasal or lung inhalers for other symptoms.  HOME CARE INSTRUCTIONS   Only take over-the-counter or prescription medicines for pain, discomfort, or fever as directed by your caregiver.  Use a warm mist humidifier or inhale steam from a shower to increase air moisture. This may keep secretions moist and make it easier to breathe.  Drink enough water and fluids to keep your urine clear or pale yellow.  Rest as needed.  Return to work when your temperature has returned to normal or as your caregiver advises. You may need to stay home longer to avoid infecting others. You can also use a face mask and careful hand washing to prevent spread of the virus. SEEK MEDICAL CARE IF:   After the first few days, you feel you are getting worse rather than better.  You need your caregiver's advice about medicines to control symptoms.  You develop chills, worsening shortness of breath, or brown or red sputum. These may be signs of pneumonia.  You develop yellow or brown nasal discharge or pain in the face, especially when you bend forward. These may be signs of sinusitis.  You develop a fever, swollen neck glands, pain with swallowing, or white areas in the back of your throat. These may be signs of strep throat. SEEK IMMEDIATE MEDICAL CARE IF:   You have a fever.  You develop severe or persistent headache, ear pain, sinus pain, or chest pain.  You develop wheezing, a prolonged cough, cough up blood, or have a change in your usual mucus (if you have chronic lung disease).  You develop sore muscles or a stiff neck. Document Released: 11/01/2000 Document Revised: 07/31/2011  Document Reviewed: 09/09/2010 [facility] Patient Information 2014 [place], [place].

## 2013-04-22 ENCOUNTER — Telehealth: Payer: Self-pay

## 2013-04-22 NOTE — Telephone Encounter (Signed)
Phone call to patient and left a message stating she is due for Prevnar vaccine.

## 2013-05-20 ENCOUNTER — Ambulatory Visit (INDEPENDENT_AMBULATORY_CARE_PROVIDER_SITE_OTHER): Payer: Medicare Other | Admitting: Internal Medicine

## 2013-05-20 ENCOUNTER — Ambulatory Visit (INDEPENDENT_AMBULATORY_CARE_PROVIDER_SITE_OTHER)
Admission: RE | Admit: 2013-05-20 | Discharge: 2013-05-20 | Disposition: A | Discharge status: Home / Self Care | Payer: Medicare Other | Source: Ambulatory Visit | Attending: Internal Medicine | Admitting: Internal Medicine

## 2013-05-20 ENCOUNTER — Encounter: Payer: Self-pay | Admitting: Internal Medicine

## 2013-05-20 VITALS — BP 140/86 | HR 63 | Temp 97.1°F | Resp 16 | Ht 62.0 in | Wt 146.0 lb

## 2013-05-20 DIAGNOSIS — J040 Acute laryngitis: Secondary | ICD-10-CM | POA: Diagnosis not present

## 2013-05-20 DIAGNOSIS — J209 Acute bronchitis, unspecified: Secondary | ICD-10-CM | POA: Diagnosis not present

## 2013-05-20 DIAGNOSIS — R059 Cough, unspecified: Secondary | ICD-10-CM | POA: Diagnosis not present

## 2013-05-20 DIAGNOSIS — R05 Cough: Secondary | ICD-10-CM

## 2013-05-20 MED ORDER — AZITHROMYCIN 500 MG PO TABS: 500.0000 mg | ORAL_TABLET | Freq: Every day | ORAL | Status: DC

## 2013-05-20 NOTE — Progress Notes (Signed)
Pre visit review using our clinic review tool, if applicable. No additional management support is needed unless otherwise documented below in the visit note. 

## 2013-05-20 NOTE — Assessment & Plan Note (Signed)
I will treat the infection with Zpak 

## 2013-05-20 NOTE — Patient Instructions (Signed)
Acute Bronchitis Bronchitis is inflammation of the airways that extend from the windpipe into the lungs (bronchi). The inflammation often causes mucus to develop. This leads to a cough, which is the most common symptom of bronchitis.  In acute bronchitis, the condition usually develops suddenly and goes away over time, usually in a couple weeks. Smoking, allergies, and asthma can make bronchitis worse. Repeated episodes of bronchitis may cause further lung problems.  CAUSES Acute bronchitis is most often caused by the same virus that causes a cold. The virus can spread from person to person (contagious).  SIGNS AND SYMPTOMS   Cough.   Fever.   Coughing up mucus.   Body aches.   Chest congestion.   Chills.   Shortness of breath.   Sore throat.  DIAGNOSIS  Acute bronchitis is usually diagnosed through a physical exam. Tests, such as chest X-rays, are sometimes done to rule out other conditions.  TREATMENT  Acute bronchitis usually goes away in a couple weeks. Often times, no medical treatment is necessary. Medicines are sometimes given for relief of fever or cough. Antibiotics are usually not needed but may be prescribed in certain situations. In some cases, an inhaler may be recommended to help reduce shortness of breath and control the cough. A cool mist vaporizer may also be used to help thin bronchial secretions and make it easier to clear the chest.  HOME CARE INSTRUCTIONS  Get plenty of rest.   Drink enough fluids to keep your urine clear or pale yellow (unless you have a medical condition that requires fluid restriction). Increasing fluids may help thin your secretions and will prevent dehydration.   Only take over-the-counter or prescription medicines as directed by your health care provider.   Avoid smoking and secondhand smoke. Exposure to cigarette smoke or irritating chemicals will make bronchitis worse. If you are a smoker, consider using nicotine gum or skin  patches to help control withdrawal symptoms. Quitting smoking will help your lungs heal faster.   Reduce the chances of another bout of acute bronchitis by washing your hands frequently, avoiding people with cold symptoms, and trying not to touch your hands to your mouth, nose, or eyes.   Follow up with your health care provider as directed.  SEEK MEDICAL CARE IF: Your symptoms do not improve after 1 week of treatment.  SEEK IMMEDIATE MEDICAL CARE IF:  You develop an increased fever or chills.   You have chest pain.   You have severe shortness of breath.  You have bloody sputum.   You develop dehydration.  You develop fainting.  You develop repeated vomiting.  You develop a severe headache. MAKE SURE YOU:   Understand these instructions.  Will watch your condition.  Will get help right away if you are not doing well or get worse. Document Released: 06/15/2004 Document Revised: 01/08/2013 Document Reviewed: 10/29/2012 [employer] Patient Information 2014 [employer].  

## 2013-05-20 NOTE — Progress Notes (Signed)
Subjective:    Patient ID: [name], female    DOB: 18-Aug-1940, 72 y.o.   MRN: [id number]  Cough This is a recurrent problem. The current episode started more than 1 month ago. The problem has been gradually worsening. The cough is non-productive. Associated symptoms include a sore throat. Pertinent negatives include no chest pain, chills, ear congestion, ear pain, fever, headaches, heartburn, hemoptysis, myalgias, nasal congestion, postnasal drip, rash, rhinorrhea, shortness of breath, sweats, weight loss or wheezing. Nothing aggravates the symptoms. She has tried prescription cough suppressant for the symptoms. The treatment provided no relief. Her past medical history is significant for bronchitis. There is no history of asthma, bronchiectasis, COPD, emphysema, environmental allergies or pneumonia.      Review of Systems  Constitutional: Negative.  Negative for fever, chills, weight loss, diaphoresis, appetite change and fatigue.  HENT: Positive for ear discharge, sore throat and voice change (she has had recurrent laryngitis for several months). Negative for congestion, ear pain, facial swelling, hearing loss, mouth sores, postnasal drip, rhinorrhea, sinus pressure, sneezing, tinnitus and trouble swallowing.   Eyes: Negative.   Respiratory: Positive for cough. Negative for apnea, hemoptysis, choking, chest tightness, shortness of breath, wheezing and stridor.   Cardiovascular: Negative.  Negative for chest pain, palpitations and leg swelling.  Gastrointestinal: Negative for heartburn, nausea, vomiting, abdominal pain, diarrhea, constipation and blood in stool.  Endocrine: Negative.   Genitourinary: Negative.   Musculoskeletal: Negative.  Negative for myalgias.  Skin: Negative.  Negative for rash.  Allergic/Immunologic: Negative.  Negative for environmental allergies.  Neurological: Negative.  Negative for headaches.  Hematological: Negative.  Negative for adenopathy. Does not  bruise/bleed easily.  Psychiatric/Behavioral: Negative.        Objective:   Physical Exam  Vitals reviewed. Constitutional: She is oriented to person, place, and time. She appears well-developed and well-nourished.  Non-toxic appearance. She does not have a sickly appearance. She does not appear ill. No distress.  HENT:  Head: Normocephalic and atraumatic.  Mouth/Throat: Oropharynx is clear and moist. No oropharyngeal exudate.  Eyes: Conjunctivae are normal. Right eye exhibits no discharge. Left eye exhibits no discharge. No scleral icterus.  Neck: Normal range of motion. Neck supple. No JVD present. No tracheal deviation present. No thyromegaly present.  Cardiovascular: Normal rate, regular rhythm and intact distal pulses.  Exam reveals no gallop and no friction rub.   No murmur heard. Pulmonary/Chest: Effort normal and breath sounds normal. No accessory muscle usage or stridor. Not tachypneic. No respiratory distress. She has no decreased breath sounds. She has no wheezes. She has no rhonchi. She has no rales. She exhibits no tenderness.  Abdominal: Soft. Bowel sounds are normal. She exhibits no distension and no mass. There is no tenderness. There is no rebound and no guarding.  Musculoskeletal: Normal range of motion. She exhibits no edema and no tenderness.  Lymphadenopathy:    She has no cervical adenopathy.  Neurological: She is oriented to person, place, and time.  Skin: Skin is warm and dry. No rash noted. She is not diaphoretic. No erythema. No pallor.  Psychiatric: She has a normal mood and affect. Her behavior is normal. Judgment and thought content normal.     Lab Results  Component Value Date   WBC 9.1 01/02/2011   HGB 14.2 01/02/2011   HCT 42.4 01/02/2011   PLT 331.0 01/02/2011   GLUCOSE 101* 01/13/2013   CHOL 136 07/09/2012   TRIG 188.0* 07/09/2012   HDL 34.60* 07/09/2012   LDLDIRECT 81.6  12/31/2009   LDLCALC 64 07/09/2012   ALT 26 01/13/2013   AST 25 01/13/2013   NA 137  01/13/2013   K 4.3 01/13/2013   CL 101 01/13/2013   CREATININE 0.7 01/13/2013   BUN 14 01/13/2013   CO2 31 01/13/2013   TSH 1.34 01/02/2011   HGBA1C 6.6* 01/13/2013       Assessment & Plan:

## 2013-05-20 NOTE — Assessment & Plan Note (Signed)
I have asked her to see ENT to consider laryngoscopy (?vocal cord tumor)

## 2013-05-20 NOTE — Assessment & Plan Note (Signed)
Her CXR is normal I will treat her for bronchitis

## 2013-05-28 ENCOUNTER — Other Ambulatory Visit: Payer: Self-pay | Admitting: Internal Medicine

## 2013-06-10 ENCOUNTER — Encounter: Payer: Self-pay | Admitting: Internal Medicine

## 2013-06-10 ENCOUNTER — Ambulatory Visit (INDEPENDENT_AMBULATORY_CARE_PROVIDER_SITE_OTHER): Payer: Medicare Other | Admitting: Internal Medicine

## 2013-06-10 VITALS — BP 148/80 | HR 58 | Temp 97.0°F | Wt 145.0 lb

## 2013-06-10 DIAGNOSIS — J209 Acute bronchitis, unspecified: Secondary | ICD-10-CM | POA: Diagnosis not present

## 2013-06-10 NOTE — Progress Notes (Signed)
Pre visit review using our clinic review tool, if applicable. No additional management support is needed unless otherwise documented below in the visit note. 

## 2013-06-10 NOTE — Progress Notes (Signed)
   Subjective:    Patient ID: [name], female    DOB: 12-Mar-1941, 73 y.o.   MRN: [id number]  HPI [name] was seen Dec 30th diagnosed with acute Bronchitis which was treated with a Z-pak. She had a normal CXR. She has done well. She did have hoarseness but this has improved and she at this time has normal phonation and will not need ENT evaluation.  PMH, FamHx and SocHx reviewed for any changes and relevance. Current Outpatient Prescriptions on File Prior to Visit  Medication Sig Dispense Refill  . metFORMIN (GLUCOPHAGE) 500 MG tablet TAKE 1 TABLET BY MOUTH TWICE A DAY  60 tablet  5  . Multiple Vitamins-Minerals (CENTRUM ULTRA WOMENS) TABS Take by mouth daily.        . propranolol-hydrochlorothiazide (INDERIDE) 40-25 MG per tablet TAKE 1 TABLET BY MOUTH EVERY DAY  30 tablet  4  . simvastatin (ZOCOR) 20 MG tablet TAKE 1 TABLET BY MOUTH EVERY DAY  30 tablet  6   No current facility-administered medications on file prior to visit.      Review of Systems System review is negative for any constitutional, cardiac, pulmonary, GI or neuro symptoms or complaints other than as described in the HPI.     Objective:   Physical Exam Filed Vitals:   06/10/13 1046  BP: 148/80  Pulse: 58  Temp: 97 F (36.1 C)   Gen'l WNWD woman in no distress HEENT - C&S clear Cor 2+ radial, RRR Pulm - normal respirations, CTAP       Assessment & Plan:  Acute Bronchitis - resolved

## 2013-06-10 NOTE — Patient Instructions (Signed)
Thanks for coming to see me.  The cough and bronchitis, along with the hoarseness, appears to be better.

## 2013-07-10 DIAGNOSIS — L259 Unspecified contact dermatitis, unspecified cause: Secondary | ICD-10-CM | POA: Diagnosis not present

## 2013-07-22 ENCOUNTER — Encounter: Payer: Self-pay | Admitting: Internal Medicine

## 2013-07-22 ENCOUNTER — Other Ambulatory Visit (INDEPENDENT_AMBULATORY_CARE_PROVIDER_SITE_OTHER): Payer: Medicare Other

## 2013-07-22 ENCOUNTER — Ambulatory Visit (INDEPENDENT_AMBULATORY_CARE_PROVIDER_SITE_OTHER): Payer: Medicare Other | Admitting: Internal Medicine

## 2013-07-22 VITALS — BP 140/80 | HR 69 | Temp 97.8°F | Wt 145.4 lb

## 2013-07-22 DIAGNOSIS — E1165 Type 2 diabetes mellitus with hyperglycemia: Secondary | ICD-10-CM

## 2013-07-22 DIAGNOSIS — G629 Polyneuropathy, unspecified: Secondary | ICD-10-CM

## 2013-07-22 DIAGNOSIS — I10 Essential (primary) hypertension: Secondary | ICD-10-CM

## 2013-07-22 DIAGNOSIS — IMO0001 Reserved for inherently not codable concepts without codable children: Secondary | ICD-10-CM | POA: Diagnosis not present

## 2013-07-22 DIAGNOSIS — I6529 Occlusion and stenosis of unspecified carotid artery: Secondary | ICD-10-CM | POA: Diagnosis not present

## 2013-07-22 DIAGNOSIS — Z23 Encounter for immunization: Secondary | ICD-10-CM | POA: Diagnosis not present

## 2013-07-22 DIAGNOSIS — E785 Hyperlipidemia, unspecified: Secondary | ICD-10-CM

## 2013-07-22 DIAGNOSIS — M109 Gout, unspecified: Secondary | ICD-10-CM

## 2013-07-22 DIAGNOSIS — G609 Hereditary and idiopathic neuropathy, unspecified: Secondary | ICD-10-CM

## 2013-07-22 LAB — COMPREHENSIVE METABOLIC PANEL
ALT: 29 U/L (ref 0–35)
AST: 29 U/L (ref 0–37)
Albumin: 4.2 g/dL (ref 3.5–5.2)
Alkaline Phosphatase: 59 U/L (ref 39–117)
BILIRUBIN TOTAL: 1.5 mg/dL — AB (ref 0.3–1.2)
BUN: 18 mg/dL (ref 6–23)
CALCIUM: 10.5 mg/dL (ref 8.4–10.5)
CO2: 31 meq/L (ref 19–32)
CREATININE: 0.7 mg/dL (ref 0.4–1.2)
Chloride: 98 mEq/L (ref 96–112)
GFR: 85.59 mL/min (ref >60.00)
GLUCOSE: 102 mg/dL — AB (ref 70–99)
Potassium: 4.2 mEq/L (ref 3.5–5.1)
Sodium: 136 mEq/L (ref 135–145)
Total Protein: 7.7 g/dL (ref 6.0–8.3)

## 2013-07-22 LAB — URIC ACID: Uric Acid, Serum: 6.6 mg/dL (ref 2.4–7.0)

## 2013-07-22 LAB — LIPID PANEL
CHOLESTEROL: 128 mg/dL (ref 0–200)
HDL: 36.4 mg/dL — AB (ref >39.00)
LDL Cholesterol: 62 mg/dL (ref 0–99)
TRIGLYCERIDES: 148 mg/dL (ref 0.0–149.0)
Total CHOL/HDL Ratio: 4
VLDL: 29.6 mg/dL (ref 0.0–40.0)

## 2013-07-22 LAB — HEMOGLOBIN A1C: Hgb A1c MFr Bld: 6.6 % — ABNORMAL HIGH (ref 4.6–6.5)

## 2013-07-22 NOTE — Progress Notes (Signed)
Subjective:    Patient ID: [name], female    DOB: 02-09-1941, 73 y.o.   MRN: [id number]  HPI Presents for follow up DM, HTN, Lipids. She has  Been doing well.  Past Medical History  Diagnosis Date  . Type II or unspecified type diabetes mellitus without mention of complication, uncontrolled   . Routine general medical examination at a health care facility   . Occlusion and stenosis of carotid artery without mention of cerebral infarction   . Unspecified venous (peripheral) insufficiency   . Displacement of lumbar intervertebral disc without myelopathy   . Sciatica   . Pain in limb   . Pain in joint, shoulder region   . Contact dermatitis and other eczema, due to unspecified cause   . Gout, unspecified   . Other and unspecified hyperlipidemia   . Unspecified essential hypertension    Past Surgical History  Procedure Laterality Date  . Laparoscopic appendectomy    . Tubal ligation    . Lumbar laminectomy  07/2008    [place]   Family History  Problem Relation Age of Onset  . Alzheimer's disease Mother   . Diabetes Mother   . Coronary artery disease Father     CABG  . Hypertension Father   . Colon cancer Neg Hx   . Breast cancer Neg Hx   . Cancer Neg Hx     breast or colon   History   Social History  . Marital Status: Married    Spouse Name: N/A    Number of Children: N/A  . Years of Education: N/A   Occupational History  . Not on file.   Social History Main Topics  . Smoking status: Former Smoker    Quit date: 05/23/1995  . Smokeless tobacco: Not on file  . Alcohol Use: Not on file  . Drug Use: No  . Sexual Activity: Yes    Partners: Male   Other Topics Concern  . Not on file   Social History Narrative   HSG.  Married '59.  2 sons - '65, '62; 1 dtr '61; 7 grandchildren; 3 great-grandchildren.  Lives w/husband in own home.  END OF LIFE: she does not want to be kept alive on machines. Needs more information in regard to CPR, etc. Provided w/[employer]  guide and [facility] (7/09)    Current Outpatient Prescriptions on File Prior to Visit  Medication Sig Dispense Refill  . metFORMIN (GLUCOPHAGE) 500 MG tablet TAKE 1 TABLET BY MOUTH TWICE A DAY  60 tablet  5  . Multiple Vitamins-Minerals (CENTRUM ULTRA WOMENS) TABS Take by mouth daily.        . propranolol-hydrochlorothiazide (INDERIDE) 40-25 MG per tablet TAKE 1 TABLET BY MOUTH EVERY DAY  30 tablet  4  . simvastatin (ZOCOR) 20 MG tablet TAKE 1 TABLET BY MOUTH EVERY DAY  30 tablet  6   No current facility-administered medications on file prior to visit.      Review of Systems Constitutional:  Negative for fever, chills, activity change and unexpected weight change.  HEENT:  Negative for hearing loss, ear pain, congestion, neck stiffness and postnasal drip. Negative for sore throat or swallowing problems. Negative for dental complaints.   Eyes: Negative for vision loss or change in visual acuity.  Respiratory: Negative for chest tightness and wheezing. Negative for DOE.   Cardiovascular: Negative for chest pain or palpitations. No decreased exercise tolerance Gastrointestinal: No change in bowel habit. No bloating or gas. No reflux or indigestion  Genitourinary: Negative for urgency, frequency, flank pain and difficulty urinating.  Musculoskeletal: Negative for myalgias, back pain, arthralgias and gait problem.  Neurological: Negative for dizziness, tremors, weakness and headaches.  Hematological: Negative for adenopathy.  Psychiatric/Behavioral: Negative for behavioral problems and dysphoric mood.       Objective:   Physical Exam Filed Vitals:   07/22/13 0956  BP: 140/80  Pulse: 69  Temp: 97.8 F (36.6 C)   Gen'l - WNWD woman in no distress HEENT- Normal. Cor - 2+ radial RRR PUlm - normal Neuro - foot exam- decreased light touch, decreased  Pin-prick, absent deep vibration.        Assessment & Plan:

## 2013-07-22 NOTE — Progress Notes (Signed)
Pre visit review using our clinic review tool, if applicable. No additional management support is needed unless otherwise documented below in the visit note. 

## 2013-07-22 NOTE — Patient Instructions (Signed)
Thanks for coming to see me.  Your heart and lungs are fine. You do have a significant loss of sensation in the legs and feet - check your feet daily.  Lab - will check sugar, cholesterol, kidney function, electrolytes today and a report will be mailed out Friday.  Immunizations - will give you Prevnar pneumonia vaccine today and will look for the date of the Pneumovas. You are current with  Other health maintenance.  Thank you for your confidence over the years.

## 2013-07-23 NOTE — Assessment & Plan Note (Signed)
Dense peripheral neuropathy - loss of deep vibratory sensation  Plan Advised to be sure to check her feet daily

## 2013-07-23 NOTE — Assessment & Plan Note (Signed)
Lab Results  Component Value Date   HGBA1C 6.6* 07/22/2013   Good control on present medication. She does have peripheral neuropathy and is advised to check her feet.  Plan Continue present medications

## 2013-07-23 NOTE — Assessment & Plan Note (Signed)
BP Readings from Last 3 Encounters:  07/22/13 140/80  06/10/13 148/80  05/20/13 140/86   BP is in acceptable range per JNC 8, however as a diabetic she will benefit from RAS medication  Plan Will discuss starting Lisinopril with the patient.

## 2013-07-23 NOTE — Assessment & Plan Note (Signed)
She is current with follow up of mild carotid disease.

## 2013-07-24 ENCOUNTER — Ambulatory Visit (INDEPENDENT_AMBULATORY_CARE_PROVIDER_SITE_OTHER): Payer: Medicare Other | Admitting: Physician Assistant

## 2013-07-24 ENCOUNTER — Encounter: Payer: Self-pay | Admitting: Physician Assistant

## 2013-07-24 VITALS — BP 138/72 | HR 70 | Temp 98.2°F

## 2013-07-24 DIAGNOSIS — T8062XA Other serum reaction due to vaccination, initial encounter: Secondary | ICD-10-CM | POA: Diagnosis not present

## 2013-07-24 DIAGNOSIS — T50A95A Adverse effect of other bacterial vaccines, initial encounter: Secondary | ICD-10-CM | POA: Diagnosis not present

## 2013-07-24 DIAGNOSIS — I6529 Occlusion and stenosis of unspecified carotid artery: Secondary | ICD-10-CM

## 2013-07-24 NOTE — Progress Notes (Signed)
Pre-visit discussion using our clinic review tool. No additional management support is needed unless otherwise documented below in the visit note.  

## 2013-07-24 NOTE — Progress Notes (Signed)
Subjective:    Patient ID: [name], female    DOB: 14-Apr-1941, 73 y.o.   MRN: [id number]  HPI Comments: Patient is a 73 year old female who presents to the office with concern of allergic reaction. Reports was seen here two days PTA, received pneumovax. Reports later that day she noted redness and soreness surrounding the injection site on her left upper arm. States she also developed a HA. Has treated the HA with Aleve with good results. Notes the area of redness has continued to increase, is sore to the touch and hot. Denies fever, facial swelling, swelling of throat, difficulty swallowing or breathing. Denies palpitations, dizziness or weakness.    Review of Systems  Constitutional: Negative for fever and chills.  HENT: Negative for facial swelling, trouble swallowing and voice change.   Eyes: Negative for visual disturbance.  Respiratory: Negative for chest tightness and shortness of breath.   Cardiovascular: Negative for chest pain.  Gastrointestinal: Negative for nausea and vomiting.  Neurological: Positive for headaches (relieved with Aleve. ). Negative for dizziness.   Past Medical History  Diagnosis Date  . Type II or unspecified type diabetes mellitus without mention of complication, uncontrolled   . Routine general medical examination at a health care facility   . Occlusion and stenosis of carotid artery without mention of cerebral infarction   . Unspecified venous (peripheral) insufficiency   . Displacement of lumbar intervertebral disc without myelopathy   . Sciatica   . Pain in limb   . Pain in joint, shoulder region   . Contact dermatitis and other eczema, due to unspecified cause   . Gout, unspecified   . Other and unspecified hyperlipidemia   . Unspecified essential hypertension    Allergies  Allergen Reactions  . Ampicillin     REACTION: causes urticaria  . Neomycin-Bacitracin Zn-Polymyx   . Penicillins     REACTION: causes urticaria       Objective:     Physical Exam  Vitals reviewed. Constitutional: She is oriented to person, place, and time. She appears well-developed and well-nourished. No distress.  HENT:  Head: Normocephalic and atraumatic.  Right Ear: External ear normal.  Left Ear: External ear normal.  Mouth/Throat: Oropharynx is clear and moist.  Eyes: Conjunctivae are normal.  Cardiovascular: Normal rate, regular rhythm, normal heart sounds and intact distal pulses.  Exam reveals no gallop and no friction rub.   No murmur heard. Pulmonary/Chest: Effort normal and breath sounds normal. No respiratory distress. She has no wheezes. She has no rales.  Speaking in full sentences.  Musculoskeletal:  LUE lateral aspect with area of erythem, edema and tactile heat measuring approximately 18 cm across and 10 cm tall at widest points. Perimeter was marked with skin marker and dated with today's date. No red streaking or localized fluctuance noted. DNVI  Neurological: She is alert and oriented to person, place, and time.  Skin: Skin is warm and dry.  Psychiatric: She has a normal mood and affect.    Filed Vitals:   07/24/13 0833  BP: 138/72  Pulse: 70  Temp: 98.2 F (36.8 C)       Assessment & Plan:   Local reaction to immunization: Can continue use of Aleve for headache and symptom relief. Also use cold/warm (whichever offers symptom relief) compresses to the area. Keep arm elevated if possible. If area itches can use Benadryl for symptom relief.  Please continue to watch the area of redness to monitor if it grows beyond  the skin markings. Watch for any red streaks up or down the arms. If you develop a fever or area of redness continues to grow please return to the office for evaluation.  Please follow up with Dr. Linda Hedges the beginning of next week for evaluation if you have not done so already.

## 2013-07-24 NOTE — Patient Instructions (Signed)
It was great to meet you today [name]!  Can continue use of Aleve for headache and symptom relief. Also use cold/warm (whichever offers symptom relief) compresses to the area. Keep arm elevated if possible. If area itches can use Benadryl for symptom relief.  Please continue to watch the area of redness to monitor if it grows beyond the skin markings. Watch for any red streaks up or down the arms. If you develop a fever or area of redness continues to grow please return to the office.   Please follow up with Dr. Linda Hedges the beginning of next week for evaluation if you have not done so already.

## 2013-07-25 ENCOUNTER — Encounter: Payer: Self-pay | Admitting: Internal Medicine

## 2013-07-26 ENCOUNTER — Encounter: Payer: Self-pay | Admitting: Family Medicine

## 2013-07-26 ENCOUNTER — Ambulatory Visit (INDEPENDENT_AMBULATORY_CARE_PROVIDER_SITE_OTHER): Payer: Medicare Other | Admitting: Family Medicine

## 2013-07-26 VITALS — BP 130/70 | HR 60 | Temp 97.8°F | Resp 18 | Wt 146.0 lb

## 2013-07-26 DIAGNOSIS — L0291 Cutaneous abscess, unspecified: Secondary | ICD-10-CM

## 2013-07-26 DIAGNOSIS — I6529 Occlusion and stenosis of unspecified carotid artery: Secondary | ICD-10-CM | POA: Diagnosis not present

## 2013-07-26 DIAGNOSIS — I10 Essential (primary) hypertension: Secondary | ICD-10-CM | POA: Diagnosis not present

## 2013-07-26 DIAGNOSIS — L0390 Cellulitis, unspecified: Secondary | ICD-10-CM | POA: Insufficient documentation

## 2013-07-26 MED ORDER — SULFAMETHOXAZOLE-TMP DS 800-160 MG PO TABS: 1.0000 | ORAL_TABLET | Freq: Two times a day (BID) | ORAL | Status: DC

## 2013-07-26 NOTE — Assessment & Plan Note (Signed)
Reaction directly around injection site, very mildly erythematous but an area of increased fluctuance, warmth and erythema has developed near her elbow. Suspicious for a secondary cellulitis. Started on Bactrim and probiotics and will follow up with PMD as scheduled

## 2013-07-26 NOTE — Patient Instructions (Signed)
Cellulitis Cellulitis is an infection of the skin and the tissue beneath it. The infected area is usually red and tender. Cellulitis occurs most often in the arms and lower legs.  CAUSES  Cellulitis is caused by bacteria that enter the skin through cracks or cuts in the skin. The most common types of bacteria that cause cellulitis are Staphylococcus and Streptococcus. SYMPTOMS   Redness and warmth.  Swelling.  Tenderness or pain.  Fever. DIAGNOSIS  Your caregiver can usually determine what is wrong based on a physical exam. Blood tests may also be done. TREATMENT  Treatment usually involves taking an antibiotic medicine. HOME CARE INSTRUCTIONS   Take your antibiotics as directed. Finish them even if you start to feel better.  Keep the infected arm or leg elevated to reduce swelling.  Apply a warm cloth to the affected area up to 4 times per day to relieve pain.  Only take over-the-counter or prescription medicines for pain, discomfort, or fever as directed by your caregiver.  Keep all follow-up appointments as directed by your caregiver. SEEK MEDICAL CARE IF:   You notice red streaks coming from the infected area.  Your red area gets larger or turns dark in color.  Your bone or joint underneath the infected area becomes painful after the skin has healed.  Your infection returns in the same area or another area.  You notice a swollen bump in the infected area.  You develop new symptoms. SEEK IMMEDIATE MEDICAL CARE IF:   You have a fever.  You feel very sleepy.  You develop vomiting or diarrhea.  You have a general ill feeling (malaise) with muscle aches and pains. MAKE SURE YOU:   Understand these instructions.  Will watch your condition.  Will get help right away if you are not doing well or get worse. Document Released: 02/15/2005 Document Revised: 11/07/2011 Document Reviewed: 07/24/2011 [employer] Patient Information 2014 [employer].  

## 2013-07-26 NOTE — Progress Notes (Signed)
Pre-visit discussion using our clinic review tool. No additional management support is needed unless otherwise documented below in the visit note.  

## 2013-07-26 NOTE — Assessment & Plan Note (Signed)
Well controlled despite reaction and anxiety

## 2013-07-26 NOTE — Progress Notes (Signed)
Patient ID: [name], female   DOB: 1940/06/05, 73 y.o.   MRN: [id number] RITAL CAVEY [id number] March 22, 1941 07/26/2013      Progress Note-Follow Up  Subjective  Chief Complaint  Chief Complaint  Patient presents with  . Arm Injury    C/O left arm redness, swelling, & fells hot. Started after she received aPrevnar inj last week. Has been seen once since injection.    HPI  Patient is a 73 year old Caucasian female who is in today for evaluation of rash on her left arm. She had a Prevnar shot earlier in the week and developed fluctuance and redness around injection site. The lesion was circled and central part of the lesion is actually less red, warm and swollen per the patient then embolized. Unfortunately redness warmth and discomfort have spread down her arm to her elbow. She denies fevers and chills. She denies chest pain, palpitations or shortness of breath. No GI or GU complaints. She does note some anorexia  Past Medical History  Diagnosis Date  . Type II or unspecified type diabetes mellitus without mention of complication, uncontrolled   . Routine general medical examination at a health care facility   . Occlusion and stenosis of carotid artery without mention of cerebral infarction   . Unspecified venous (peripheral) insufficiency   . Displacement of lumbar intervertebral disc without myelopathy   . Sciatica   . Pain in limb   . Pain in joint, shoulder region   . Contact dermatitis and other eczema, due to unspecified cause   . Gout, unspecified   . Other and unspecified hyperlipidemia   . Unspecified essential hypertension     Past Surgical History  Procedure Laterality Date  . Laparoscopic appendectomy    . Tubal ligation    . Lumbar laminectomy  07/2008    [place]    Family History  Problem Relation Age of Onset  . Alzheimer's disease Mother   . Diabetes Mother   . Coronary artery disease Father     CABG  . Hypertension Father   . Colon cancer Neg Hx   .  Breast cancer Neg Hx   . Cancer Neg Hx     breast or colon    History   Social History  . Marital Status: Married    Spouse Name: N/A    Number of Children: N/A  . Years of Education: N/A   Occupational History  . Not on file.   Social History Main Topics  . Smoking status: Former Smoker    Quit date: 05/23/1995  . Smokeless tobacco: Not on file  . Alcohol Use: Not on file  . Drug Use: No  . Sexual Activity: Yes    Partners: Male   Other Topics Concern  . Not on file   Social History Narrative   HSG.  Married '59.  2 sons - '65, '62; 1 dtr '61; 7 grandchildren; 3 great-grandchildren.  Lives w/husband in own home.  END OF LIFE: she does not want to be kept alive on machines. Needs more information in regard to CPR, etc. Provided w/[employer] and [facility] (7/09)    Current Outpatient Prescriptions on File Prior to Visit  Medication Sig Dispense Refill  . metFORMIN (GLUCOPHAGE) 500 MG tablet TAKE 1 TABLET BY MOUTH TWICE A DAY  60 tablet  5  . Multiple Vitamins-Minerals (CENTRUM ULTRA WOMENS) TABS Take by mouth daily.        . propranolol-hydrochlorothiazide (INDERIDE) 40-25 MG per tablet TAKE 1  TABLET BY MOUTH EVERY DAY  30 tablet  4  . simvastatin (ZOCOR) 20 MG tablet TAKE 1 TABLET BY MOUTH EVERY DAY  30 tablet  6   No current facility-administered medications on file prior to visit.    Allergies  Allergen Reactions  . Ampicillin     REACTION: causes urticaria  . Neomycin-Bacitracin Zn-Polymyx   . Penicillins     REACTION: causes urticaria    Review of Systems  Review of Systems  Constitutional: Positive for chills. Negative for fever and malaise/fatigue.  HENT: Negative for congestion.   Eyes: Negative for discharge.  Respiratory: Negative for shortness of breath.   Cardiovascular: Negative for chest pain, palpitations and leg swelling.  Gastrointestinal: Negative for nausea, vomiting, abdominal pain and diarrhea.  Musculoskeletal: Negative for myalgias.   Skin: Positive for rash. Negative for itching.       Left arm  Neurological: Negative for loss of consciousness and headaches.  Endo/Heme/Allergies: Negative for polydipsia.  Psychiatric/Behavioral: The patient is nervous/anxious.     Objective  BP 130/70  Pulse 60  Temp(Src) 97.8 F (36.6 C) (Oral)  Resp 18  Wt 146 lb (66.225 kg)  Physical Exam  Physical Exam  Constitutional: She is oriented to person, place, and time and well-developed, well-nourished, and in no distress. No distress.  HENT:  Head: Normocephalic and atraumatic.  Eyes: Conjunctivae are normal.  Neck: Neck supple. No thyromegaly present.  Cardiovascular: Normal rate, regular rhythm and normal heart sounds.   No murmur heard. Pulmonary/Chest: Effort normal and breath sounds normal. She has no wheezes.  Abdominal: She exhibits no distension and no mass.  Musculoskeletal: She exhibits no edema.  Lymphadenopathy:    She has no cervical adenopathy.  Neurological: She is alert and oriented to person, place, and time.  Skin: Skin is warm and dry. Rash noted. She is not diaphoretic. There is erythema.  Erythematous patch extending to left elbow it is fluctuant and warm  Psychiatric: Memory, affect and judgment normal.    Lab Results  Component Value Date   TSH 1.34 01/02/2011   Lab Results  Component Value Date   WBC 9.1 01/02/2011   HGB 14.2 01/02/2011   HCT 42.4 01/02/2011   MCV 89.4 01/02/2011   PLT 331.0 01/02/2011   Lab Results  Component Value Date   CREATININE 0.7 07/22/2013   BUN 18 07/22/2013   NA 136 07/22/2013   K 4.2 07/22/2013   CL 98 07/22/2013   CO2 31 07/22/2013   Lab Results  Component Value Date   ALT 29 07/22/2013   AST 29 07/22/2013   ALKPHOS 59 07/22/2013   BILITOT 1.5* 07/22/2013   Lab Results  Component Value Date   CHOL 128 07/22/2013   Lab Results  Component Value Date   HDL 36.40* 07/22/2013   Lab Results  Component Value Date   LDLCALC 62 07/22/2013   Lab Results  Component Value  Date   TRIG 148.0 07/22/2013   Lab Results  Component Value Date   CHOLHDL 4 07/22/2013     Assessment & Plan  HYPERTENSION Well controlled despite reaction and anxiety  Cellulitis Reaction directly around injection site, very mildly erythematous but an area of increased fluctuance, warmth and erythema has developed near her elbow. Suspicious for a secondary cellulitis. Started on Bactrim and probiotics and will follow up with PMD as scheduled

## 2013-07-28 ENCOUNTER — Ambulatory Visit (INDEPENDENT_AMBULATORY_CARE_PROVIDER_SITE_OTHER): Payer: Medicare Other | Admitting: Internal Medicine

## 2013-07-28 ENCOUNTER — Encounter: Payer: Self-pay | Admitting: Internal Medicine

## 2013-07-28 VITALS — BP 144/68 | HR 58 | Temp 97.2°F | Wt 146.0 lb

## 2013-07-28 DIAGNOSIS — T50905A Adverse effect of unspecified drugs, medicaments and biological substances, initial encounter: Secondary | ICD-10-CM

## 2013-07-28 DIAGNOSIS — I6529 Occlusion and stenosis of unspecified carotid artery: Secondary | ICD-10-CM

## 2013-07-28 DIAGNOSIS — T887XXA Unspecified adverse effect of drug or medicament, initial encounter: Secondary | ICD-10-CM

## 2013-07-28 NOTE — Progress Notes (Signed)
Pre visit review using our clinic review tool, if applicable. No additional management support is needed unless otherwise documented below in the visit note. 

## 2013-07-28 NOTE — Progress Notes (Signed)
   Subjective:    Patient ID: [name], female    DOB: 1941-02-05, 73 y.o.   MRN: [id number]  HPI She had a terrible reaction to Prevnar with erythema, heat, pain of the entire proximal Left UE. She was seen by Stacy Gardner, PA March 5th and was seen for follow up due to extension by Dr. Randel Pigg March 7th. She is currently doing much better with almost complete resolution of erythema and pain in the left UE.  PMH, FamHx and SocHx reviewed for any changes and relevance.  Current Outpatient Prescriptions on File Prior to Visit  Medication Sig Dispense Refill  . metFORMIN (GLUCOPHAGE) 500 MG tablet TAKE 1 TABLET BY MOUTH TWICE A DAY  60 tablet  5  . Multiple Vitamins-Minerals (CENTRUM ULTRA WOMENS) TABS Take by mouth daily.        . propranolol-hydrochlorothiazide (INDERIDE) 40-25 MG per tablet TAKE 1 TABLET BY MOUTH EVERY DAY  30 tablet  4  . simvastatin (ZOCOR) 20 MG tablet TAKE 1 TABLET BY MOUTH EVERY DAY  30 tablet  6  . sulfamethoxazole-trimethoprim (BACTRIM DS) 800-160 MG per tablet Take 1 tablet by mouth 2 (two) times daily.  14 tablet  0   No current facility-administered medications on file prior to visit.      Review of Systems System review is negative for any constitutional, cardiac, pulmonary, GI or neuro symptoms or complaints other than as described in the HPI.     Objective:   Physical Exam Filed Vitals:   07/28/13 1116  BP: 144/68  Pulse: 58  Temp: 97.2 F (36.2 C)   gen'l  WNWD woman Derm - 95% resolution of erythema left proximal UE       Assessment & Plan:  Drug reaction - resolved

## 2013-09-08 ENCOUNTER — Other Ambulatory Visit: Payer: Self-pay

## 2013-09-08 MED ORDER — PROPRANOLOL-HCTZ 40-25 MG PO TABS: ORAL_TABLET | ORAL | Status: DC

## 2013-09-08 MED ORDER — SIMVASTATIN 20 MG PO TABS: ORAL_TABLET | ORAL | Status: DC

## 2013-10-28 DIAGNOSIS — H40059 Ocular hypertension, unspecified eye: Secondary | ICD-10-CM | POA: Diagnosis not present

## 2013-10-28 DIAGNOSIS — E119 Type 2 diabetes mellitus without complications: Secondary | ICD-10-CM | POA: Diagnosis not present

## 2013-10-28 LAB — HM DIABETES EYE EXAM

## 2013-11-05 ENCOUNTER — Ambulatory Visit (INDEPENDENT_AMBULATORY_CARE_PROVIDER_SITE_OTHER): Payer: Medicare Other | Admitting: Internal Medicine

## 2013-11-05 ENCOUNTER — Encounter: Payer: Self-pay | Admitting: Internal Medicine

## 2013-11-05 VITALS — BP 120/68 | HR 84 | Temp 98.2°F | Resp 16 | Wt 145.5 lb

## 2013-11-05 DIAGNOSIS — E1165 Type 2 diabetes mellitus with hyperglycemia: Principal | ICD-10-CM

## 2013-11-05 DIAGNOSIS — I6529 Occlusion and stenosis of unspecified carotid artery: Secondary | ICD-10-CM

## 2013-11-05 DIAGNOSIS — IMO0001 Reserved for inherently not codable concepts without codable children: Secondary | ICD-10-CM | POA: Diagnosis not present

## 2013-11-05 DIAGNOSIS — B029 Zoster without complications: Secondary | ICD-10-CM | POA: Diagnosis not present

## 2013-11-05 MED ORDER — VALACYCLOVIR HCL 1 G PO TABS: 1000.0000 mg | ORAL_TABLET | Freq: Two times a day (BID) | ORAL | Status: DC

## 2013-11-05 NOTE — Progress Notes (Signed)
Subjective:    Patient ID: [name], female    DOB: 04/22/1941, 73 y.o.   MRN: [id number]  Rash This is a new problem. The current episode started in the past 7 days. The problem has been gradually worsening since onset. The affected locations include the right foot. The rash is characterized by redness and pain. She was exposed to nothing. Pertinent negatives include no anorexia, congestion, cough, diarrhea, eye pain, facial edema, fatigue, fever, joint pain, nail changes, rhinorrhea, shortness of breath, sore throat or vomiting. Past treatments include nothing. The treatment provided mild relief. Her past medical history is significant for varicella.      Review of Systems  Constitutional: Negative.  Negative for fever, chills, diaphoresis, appetite change and fatigue.  HENT: Negative.  Negative for congestion, rhinorrhea and sore throat.   Eyes: Negative.  Negative for pain.  Respiratory: Negative.  Negative for cough, choking, chest tightness, shortness of breath and stridor.   Cardiovascular: Negative.  Negative for chest pain, palpitations and leg swelling.  Gastrointestinal: Negative.  Negative for vomiting, abdominal pain, diarrhea and anorexia.  Endocrine: Negative.   Genitourinary: Negative.   Musculoskeletal: Negative.  Negative for joint pain.  Skin: Positive for rash. Negative for nail changes, color change, pallor and wound.  Allergic/Immunologic: Negative.   Neurological: Negative.   Hematological: Negative.  Negative for adenopathy. Does not bruise/bleed easily.  Psychiatric/Behavioral: Negative.        Objective:   Physical Exam  Vitals reviewed. Constitutional: She is oriented to person, place, and time. She appears well-developed and well-nourished. No distress.  HENT:  Head: Normocephalic and atraumatic.  Mouth/Throat: Oropharynx is clear and moist. No oropharyngeal exudate.  Eyes: Conjunctivae are normal. Right eye exhibits no discharge. Left eye exhibits  no discharge. No scleral icterus.  Neck: Normal range of motion. Neck supple. No JVD present. No tracheal deviation present. No thyromegaly present.  Cardiovascular: Normal rate, regular rhythm, normal heart sounds and intact distal pulses.  Exam reveals no gallop and no friction rub.   No murmur heard. Pulmonary/Chest: Effort normal and breath sounds normal. No stridor. No respiratory distress. She has no wheezes. She has no rales. She exhibits no tenderness.  Abdominal: Soft. Bowel sounds are normal. She exhibits no distension and no mass. There is no tenderness. There is no rebound and no guarding.  Musculoskeletal: Normal range of motion. She exhibits no edema and no tenderness.       Feet:  Lymphadenopathy:    She has no cervical adenopathy.  Neurological: She is oriented to person, place, and time.  Skin: Skin is warm and dry. Rash noted. No abrasion, no bruising, no burn, no ecchymosis, no laceration, no lesion, no petechiae and no purpura noted. Rash is vesicular. Rash is not macular, not papular, not maculopapular, not nodular, not pustular and not urticarial. She is not diaphoretic. There is erythema. No pallor.  Psychiatric: She has a normal mood and affect. Her behavior is normal. Judgment and thought content normal.     Lab Results  Component Value Date   WBC 9.1 01/02/2011   HGB 14.2 01/02/2011   HCT 42.4 01/02/2011   PLT 331.0 01/02/2011   GLUCOSE 102* 07/22/2013   CHOL 128 07/22/2013   TRIG 148.0 07/22/2013   HDL 36.40* 07/22/2013   LDLDIRECT 81.6 12/31/2009   LDLCALC 62 07/22/2013   ALT 29 07/22/2013   AST 29 07/22/2013   NA 136 07/22/2013   K 4.2 07/22/2013   CL 98 07/22/2013  CREATININE 0.7 07/22/2013   BUN 18 07/22/2013   CO2 31 07/22/2013   TSH 1.34 01/02/2011   HGBA1C 6.6* 07/22/2013       Assessment & Plan:

## 2013-11-05 NOTE — Patient Instructions (Signed)
Shingles °Shingles (herpes zoster) is an infection that is caused by the same virus that causes chickenpox (varicella). The infection causes a painful skin rash and fluid-filled blisters, which eventually break open, crust over, and heal. It may occur in any area of the body, but it usually affects only one side of the body or face. The pain of shingles usually lasts about 1 month. However, some people with shingles may develop long-term (chronic) pain in the affected area of the body. °Shingles often occurs many years after the person had chickenpox. It is more common: °· In people older than 50 years. °· In people with weakened immune systems, such as those with HIV, AIDS, or cancer. °· In people taking medicines that weaken the immune system, such as transplant medicines. °· In people under great stress. °CAUSES  °Shingles is caused by the varicella zoster virus (VZV), which also causes chickenpox. After a person is infected with the virus, it can remain in the person's body for years in an inactive state (dormant). To cause shingles, the virus reactivates and breaks out as an infection in a nerve root. °The virus can be spread from person to person (contagious) through contact with open blisters of the shingles rash. It will only spread to people who have not had chickenpox. When these people are exposed to the virus, they may develop chickenpox. They will not develop shingles. Once the blisters scab over, the person is no longer contagious and cannot spread the virus to others. °SYMPTOMS  °Shingles shows up in stages. The initial symptoms may be pain, itching, and tingling in an area of the skin. This pain is usually described as burning, stabbing, or throbbing. In a few days or weeks, a painful red rash will appear in the area where the pain, itching, and tingling were felt. The rash is usually on one side of the body in a band or belt-like pattern. Then, the rash usually turns into fluid-filled blisters. They  will scab over and dry up in approximately 2-3 weeks. °Flu-like symptoms may also occur with the initial symptoms, the rash, or the blisters. These may include: °· Fever. °· Chills. °· Headache. °· Upset stomach. °DIAGNOSIS  °Your caregiver will perform a skin exam to diagnose shingles. Skin scrapings or fluid samples may also be taken from the blisters. This sample will be examined under a microscope or sent to a lab for further testing. °TREATMENT  °There is no specific cure for shingles. Your caregiver will likely prescribe medicines to help you manage the pain, recover faster, and avoid long-term problems. This may include antiviral drugs, anti-inflammatory drugs, and pain medicines. °HOME CARE INSTRUCTIONS  °· Take a cool bath or apply cool compresses to the area of the rash or blisters as directed. This may help with the pain and itching.   °· Only take over-the-counter or prescription medicines as directed by your caregiver.   °· Rest as directed by your caregiver. °· Keep your rash and blisters clean with mild soap and cool water or as directed by your caregiver.  °· Do not pick your blisters or scratch your rash. Apply an anti-itch cream or numbing creams to the affected area as directed by your caregiver. °· Keep your shingles rash covered with a loose bandage (dressing). °· Avoid skin contact with: °¨ Babies.   °¨ Pregnant women.   °¨ Children with eczema.   °¨ Elderly people with transplants.   °¨ People with chronic illnesses, such as leukemia or AIDS.   °· Wear loose-fitting clothing to help ease the   pain of material rubbing against the rash. °· Keep all follow-up appointments with your caregiver. If the area involved is on your face, you may receive a referral for follow-up to a specialist, such as an eye doctor (ophthalmologist) or an ear, nose, and throat (ENT) doctor. Keeping all follow-up appointments will help you avoid eye complications, chronic pain, or disability.   °SEEK IMMEDIATE MEDICAL  CARE IF:  °· You have facial pain, pain around the eye area, or loss of feeling on one side of your face. °· You have ear pain or ringing in your ear. °· You have loss of taste. °· Your pain is not relieved with prescribed medicines.   °· Your redness or swelling spreads.   °· You have more pain and swelling.  °· Your condition is worsening or has changed.   °· You have a fever or persistent symptoms for more than 2-3 days. °· You have a fever and your symptoms suddenly get worse. °MAKE SURE YOU: °· Understand these instructions. °· Will watch your condition. °· Will get help right away if you are not doing well or get worse. °Document Released: 05/08/2005 Document Revised: 01/31/2012 Document Reviewed: 12/21/2011 °[employer]® Patient Information ©2015 [employer]. This information is not intended to replace advice given to you by your health care Maat Kafer. Make sure you discuss any questions you have with your health care Tamme Mozingo. ° °

## 2013-11-05 NOTE — Progress Notes (Signed)
Pre visit review using our clinic review tool, if applicable. No additional management support is needed unless otherwise documented below in the visit note. 

## 2013-11-06 ENCOUNTER — Encounter: Payer: Self-pay | Admitting: Internal Medicine

## 2013-11-06 NOTE — Assessment & Plan Note (Signed)
Will treat with valtrex She does not want anything for pain

## 2013-11-26 ENCOUNTER — Other Ambulatory Visit (INDEPENDENT_AMBULATORY_CARE_PROVIDER_SITE_OTHER): Payer: Medicare Other

## 2013-11-26 ENCOUNTER — Ambulatory Visit (INDEPENDENT_AMBULATORY_CARE_PROVIDER_SITE_OTHER): Payer: Medicare Other | Admitting: Internal Medicine

## 2013-11-26 ENCOUNTER — Encounter: Payer: Self-pay | Admitting: Internal Medicine

## 2013-11-26 VITALS — BP 125/80 | HR 65 | Temp 97.7°F | Resp 15 | Ht 62.0 in | Wt 143.0 lb

## 2013-11-26 DIAGNOSIS — I6529 Occlusion and stenosis of unspecified carotid artery: Secondary | ICD-10-CM | POA: Diagnosis not present

## 2013-11-26 DIAGNOSIS — E1149 Type 2 diabetes mellitus with other diabetic neurological complication: Secondary | ICD-10-CM | POA: Diagnosis not present

## 2013-11-26 DIAGNOSIS — E785 Hyperlipidemia, unspecified: Secondary | ICD-10-CM | POA: Diagnosis not present

## 2013-11-26 DIAGNOSIS — M109 Gout, unspecified: Secondary | ICD-10-CM

## 2013-11-26 DIAGNOSIS — I10 Essential (primary) hypertension: Secondary | ICD-10-CM

## 2013-11-26 DIAGNOSIS — M5126 Other intervertebral disc displacement, lumbar region: Secondary | ICD-10-CM

## 2013-11-26 LAB — BASIC METABOLIC PANEL
BUN: 21 mg/dL (ref 6–23)
CHLORIDE: 103 meq/L (ref 96–112)
CO2: 29 mEq/L (ref 19–32)
Calcium: 9.9 mg/dL (ref 8.4–10.5)
Creatinine, Ser: 0.7 mg/dL (ref 0.4–1.2)
GFR: 85.51 mL/min (ref >60.00)
GLUCOSE: 122 mg/dL — AB (ref 70–99)
POTASSIUM: 4.6 meq/L (ref 3.5–5.1)
SODIUM: 140 meq/L (ref 135–145)

## 2013-11-26 LAB — HEMOGLOBIN A1C: Hgb A1c MFr Bld: 6.5 % (ref 4.6–6.5)

## 2013-11-26 NOTE — Patient Instructions (Signed)
Type 2 Diabetes Mellitus, Adult Type 2 diabetes mellitus, often simply referred to as type 2 diabetes, is a long-lasting (chronic) disease. In type 2 diabetes, the pancreas does not make enough insulin (a hormone), the cells are less responsive to the insulin that is made (insulin resistance), or both. Normally, insulin moves sugars from food into the tissue cells. The tissue cells use the sugars for energy. The lack of insulin or the lack of normal response to insulin causes excess sugars to build up in the blood instead of going into the tissue cells. As a result, high blood sugar (hyperglycemia) develops. The effect of high sugar (glucose) levels can cause many complications. Type 2 diabetes was also previously called adult-onset diabetes but it can occur at any age.  RISK FACTORS  A person is predisposed to developing type 2 diabetes if someone in the family has the disease and also has one or more of the following primary risk factors:  Overweight.  An inactive lifestyle.  A history of consistently eating high-calorie foods. Maintaining a normal weight and regular physical activity can reduce the chance of developing type 2 diabetes. SYMPTOMS  A person with type 2 diabetes may not show symptoms initially. The symptoms of type 2 diabetes appear slowly. The symptoms include:  Increased thirst (polydipsia).  Increased urination (polyuria).  Increased urination during the night (nocturia).  Weight loss. This weight loss may be rapid.  Frequent, recurring infections.  Tiredness (fatigue).  Weakness.  Vision changes, such as blurred vision.  Fruity smell to your breath.  Abdominal pain.  Nausea or vomiting.  Cuts or bruises which are slow to heal.  Tingling or numbness in the hands or feet. DIAGNOSIS Type 2 diabetes is frequently not diagnosed until complications of diabetes are present. Type 2 diabetes is diagnosed when symptoms or complications are present and when blood  glucose levels are increased. Your blood glucose level may be checked by one or more of the following blood tests:  A fasting blood glucose test. You will not be allowed to eat for at least 8 hours before a blood sample is taken.  A random blood glucose test. Your blood glucose is checked at any time of the day regardless of when you ate.  A hemoglobin A1c blood glucose test. A hemoglobin A1c test provides information about blood glucose control over the previous 3 months.  An oral glucose tolerance test (OGTT). Your blood glucose is measured after you have not eaten (fasted) for 2 hours and then after you drink a glucose-containing beverage. TREATMENT   You may need to take insulin or diabetes medicine daily to keep blood glucose levels in the desired range.  If you use insulin, you may need to adjust the dosage depending on the carbohydrates that you eat with each meal or snack. The treatment goal is to maintain the before meal blood sugar (preprandial glucose) level at 70-130 mg/dL. HOME CARE INSTRUCTIONS   Have your hemoglobin A1c level checked twice a year.  Perform daily blood glucose monitoring as directed by your health care provider.  Monitor urine ketones when you are ill and as directed by your health care provider.  Take your diabetes medicine or insulin as directed by your health care provider to maintain your blood glucose levels in the desired range.  Never run out of diabetes medicine or insulin. It is needed every day.  If you are using insulin, you may need to adjust the amount of insulin given based on your intake   of carbohydrates. Carbohydrates can raise blood glucose levels but need to be included in your diet. Carbohydrates provide vitamins, minerals, and fiber which are an essential part of a healthy diet. Carbohydrates are found in fruits, vegetables, whole grains, dairy products, legumes, and foods containing added sugars.  Eat healthy foods. You should make an  appointment to see a registered dietitian to help you create an eating plan that is right for you.  Lose weight if overweight.  Carry a medical alert card or wear your medical alert jewelry.  Carry a 15 gram carbohydrate snack with you at all times to treat low blood glucose (hypoglycemia). Some examples of 15 gram carbohydrate snacks include:  Glucose tablets, 3 or 4  Raisins, 2 tablespoons (24 grams)  Jelly beans, 6  Animal crackers, 8  Regular pop, 4 ounces (120 mL)  Gummy treats, 9  Recognize hypoglycemia. Hypoglycemia occurs with blood glucose levels of 70 mg/dL and below. The risk for hypoglycemia increases when fasting or skipping meals, during or after intense exercise, and during sleep. Hypoglycemia symptoms can include:  Tremors or shakes.  Decreased ability to concentrate.  Sweating.  Increased heart rate.  Headache.  Dry mouth.  Hunger.  Irritability.  Anxiety.  Restless sleep.  Altered speech or coordination.  Confusion.  Treat hypoglycemia promptly. If you are alert and able to safely swallow, follow the 15:15 rule:  Take 15-20 grams of rapid-acting glucose or carbohydrate. Rapid-acting options include glucose gel, glucose tablets, or 4 ounces (120 mL) of fruit juice, regular soda, or low fat milk.  Check your blood glucose level 15 minutes after taking the glucose.  Take 15-20 grams more of glucose if the repeat blood glucose level is still 70 mg/dL or below.  Eat a meal or snack within 1 hour once blood glucose levels return to normal.  Be alert to feeling very thirsty and urinating more frequently than usual, which are early signs of hyperglycemia. An early awareness of hyperglycemia allows for prompt treatment. Treat hyperglycemia as directed by your health care provider.  Engage in at least 150 minutes of moderate-intensity physical activity a week, spread over at least 3 days of the week or as directed by your health care provider. In  addition, you should engage in resistance exercise at least 2 times a week or as directed by your health care provider.  Adjust your medicine and food intake as needed if you start a new exercise or sport.  Follow your sick day plan at any time you are unable to eat or drink as usual.  Avoid tobacco use.  Limit alcohol intake to no more than 1 drink per day for nonpregnant women and 2 drinks per day for men. You should drink alcohol only when you are also eating food. Talk with your health care provider whether alcohol is safe for you. Tell your health care provider if you drink alcohol several times a week.  Follow up with your health care provider regularly.  Schedule an eye exam soon after the diagnosis of type 2 diabetes and then annually.  Perform daily skin and foot care. Examine your skin and feet daily for cuts, bruises, redness, nail problems, bleeding, blisters, or sores. A foot exam by a health care provider should be done annually.  Brush your teeth and gums at least twice a day and floss at least once a day. Follow up with your dentist regularly.  Share your diabetes management plan with your workplace or school.  Stay up-to-date with   immunizations.  Learn to manage stress.  Obtain ongoing diabetes education and support as needed.  Participate in, or seek rehabilitation as needed to maintain or improve independence and quality of life. Request a physical or occupational therapy referral if you are having foot or hand numbness or difficulties with grooming, dressing, eating, or physical activity. SEEK MEDICAL CARE IF:   You are unable to eat food or drink fluids for more than 6 hours.  You have nausea and vomiting for more than 6 hours.  Your blood glucose level is over 240 mg/dL.  There is a change in mental status.  You develop an additional serious illness.  You have diarrhea for more than 6 hours.  You have been sick or have had a fever for a couple of days  and are not getting better.  You have pain during any physical activity.  SEEK IMMEDIATE MEDICAL CARE IF:  You have difficulty breathing.  You have moderate to large ketone levels. MAKE SURE YOU:  Understand these instructions.  Will watch your condition.  Will get help right away if you are not doing well or get worse. Document Released: 05/08/2005 Document Revised: 05/13/2013 Document Reviewed: 12/05/2011 [employer] Patient Information 2015 [employer]. This information is not intended to replace advice given to you by your health care provider. Make sure you discuss any questions you have with your health care provider.  

## 2013-11-26 NOTE — Progress Notes (Signed)
Subjective:    Patient ID: [name], female    DOB: 1941/03/08, 73 y.o.   MRN: [id number]  Hypertension This is a chronic problem. The current episode started more than 1 year ago. The problem has been gradually improving since onset. The problem is controlled. Pertinent negatives include no anxiety, blurred vision, chest pain, headaches, malaise/fatigue, neck pain, orthopnea, palpitations, peripheral edema, PND, shortness of breath or sweats. There are no associated agents to hypertension. Past treatments include beta blockers and diuretics. The current treatment provides moderate improvement. There are no compliance problems.       Review of Systems  Constitutional: Negative.  Negative for fever, chills, malaise/fatigue, diaphoresis, appetite change and fatigue.  HENT: Negative.   Eyes: Negative.  Negative for blurred vision.  Respiratory: Negative.  Negative for apnea, cough, choking, chest tightness, shortness of breath, wheezing and stridor.   Cardiovascular: Negative.  Negative for chest pain, palpitations, orthopnea, leg swelling and PND.  Gastrointestinal: Negative.  Negative for nausea, vomiting, abdominal pain, diarrhea, constipation and blood in stool.  Endocrine: Negative.  Negative for polydipsia, polyphagia and polyuria.  Genitourinary: Negative.  Negative for dysuria, frequency, flank pain and enuresis.  Musculoskeletal: Negative.  Negative for arthralgias, back pain, joint swelling, myalgias and neck pain.  Skin: Negative.  Negative for rash.  Allergic/Immunologic: Negative.   Neurological: Negative.  Negative for dizziness, facial asymmetry, weakness, light-headedness, numbness and headaches.  Hematological: Negative.  Negative for adenopathy. Does not bruise/bleed easily.  Psychiatric/Behavioral: Negative.        Objective:   Physical Exam  Vitals reviewed. Constitutional: She is oriented to person, place, and time. She appears well-developed and well-nourished.  No distress.  HENT:  Head: Normocephalic and atraumatic.  Mouth/Throat: Oropharynx is clear and moist. No oropharyngeal exudate.  Eyes: Conjunctivae are normal. Right eye exhibits no discharge. Left eye exhibits no discharge. No scleral icterus.  Neck: Normal range of motion. Neck supple. No JVD present. No tracheal deviation present. No thyromegaly present.  Cardiovascular: Normal rate, regular rhythm, normal heart sounds and intact distal pulses.  Exam reveals no gallop and no friction rub.   No murmur heard. Pulmonary/Chest: Effort normal and breath sounds normal. No stridor. No respiratory distress. She has no wheezes. She has no rales. She exhibits no tenderness.  Abdominal: Soft. Bowel sounds are normal. She exhibits no distension and no mass. There is no tenderness. There is no rebound and no guarding.  Musculoskeletal: Normal range of motion. She exhibits no edema and no tenderness.  Lymphadenopathy:    She has no cervical adenopathy.  Neurological: She is oriented to person, place, and time.  Skin: Skin is warm and dry. No rash noted. She is not diaphoretic. No erythema. No pallor.  Psychiatric: She has a normal mood and affect. Her behavior is normal. Judgment and thought content normal.     Lab Results  Component Value Date   WBC 9.1 01/02/2011   HGB 14.2 01/02/2011   HCT 42.4 01/02/2011   PLT 331.0 01/02/2011   GLUCOSE 102* 07/22/2013   CHOL 128 07/22/2013   TRIG 148.0 07/22/2013   HDL 36.40* 07/22/2013   LDLDIRECT 81.6 12/31/2009   LDLCALC 62 07/22/2013   ALT 29 07/22/2013   AST 29 07/22/2013   NA 136 07/22/2013   K 4.2 07/22/2013   CL 98 07/22/2013   CREATININE 0.7 07/22/2013   BUN 18 07/22/2013   CO2 31 07/22/2013   TSH 1.34 01/02/2011   HGBA1C 6.6* 07/22/2013  Assessment & Plan:

## 2013-11-26 NOTE — Progress Notes (Signed)
Pre visit review using our clinic review tool, if applicable. No additional management support is needed unless otherwise documented below in the visit note. 

## 2013-11-27 ENCOUNTER — Encounter: Payer: Self-pay | Admitting: Internal Medicine

## 2013-11-27 ENCOUNTER — Telehealth: Payer: Self-pay | Admitting: Internal Medicine

## 2013-11-27 NOTE — Assessment & Plan Note (Signed)
Her BP is well controlled Her lytes and renal function are stable 

## 2013-11-27 NOTE — Telephone Encounter (Signed)
Relevant patient education mailed to patient.  

## 2013-11-27 NOTE — Assessment & Plan Note (Signed)
Her blood sugars are well controlled 

## 2013-12-04 ENCOUNTER — Other Ambulatory Visit: Payer: Self-pay

## 2013-12-04 MED ORDER — METFORMIN HCL 500 MG PO TABS: ORAL_TABLET | ORAL | Status: DC

## 2013-12-04 NOTE — Telephone Encounter (Signed)
Rx request for Metformin HCL 500 mg tablet

## 2013-12-26 DIAGNOSIS — Z124 Encounter for screening for malignant neoplasm of cervix: Secondary | ICD-10-CM | POA: Diagnosis not present

## 2013-12-26 DIAGNOSIS — Z1231 Encounter for screening mammogram for malignant neoplasm of breast: Secondary | ICD-10-CM | POA: Diagnosis not present

## 2013-12-26 DIAGNOSIS — Z0000 Encounter for general adult medical examination without abnormal findings: Secondary | ICD-10-CM | POA: Diagnosis not present

## 2013-12-26 DIAGNOSIS — Z01419 Encounter for gynecological examination (general) (routine) without abnormal findings: Secondary | ICD-10-CM | POA: Diagnosis not present

## 2013-12-26 LAB — HM MAMMOGRAPHY: HM MAMMO: NORMAL

## 2013-12-26 LAB — HM PAP SMEAR: HM Pap smear: NORMAL

## 2014-01-22 ENCOUNTER — Ambulatory Visit: Payer: Medicare Other | Admitting: Internal Medicine

## 2014-02-07 ENCOUNTER — Other Ambulatory Visit: Payer: Self-pay | Admitting: Internal Medicine

## 2014-02-23 ENCOUNTER — Other Ambulatory Visit: Payer: Self-pay | Admitting: Geriatric Medicine

## 2014-02-23 MED ORDER — SIMVASTATIN 20 MG PO TABS: ORAL_TABLET | ORAL | Status: DC

## 2014-02-25 ENCOUNTER — Other Ambulatory Visit (HOSPITAL_COMMUNITY): Payer: Self-pay | Admitting: *Deleted

## 2014-02-25 DIAGNOSIS — I6523 Occlusion and stenosis of bilateral carotid arteries: Secondary | ICD-10-CM

## 2014-03-05 ENCOUNTER — Ambulatory Visit (HOSPITAL_COMMUNITY): Payer: Medicare Other | Attending: Cardiology | Admitting: Cardiology

## 2014-03-05 DIAGNOSIS — I10 Essential (primary) hypertension: Secondary | ICD-10-CM | POA: Diagnosis not present

## 2014-03-05 DIAGNOSIS — E785 Hyperlipidemia, unspecified: Secondary | ICD-10-CM | POA: Insufficient documentation

## 2014-03-05 DIAGNOSIS — Z87891 Personal history of nicotine dependence: Secondary | ICD-10-CM | POA: Insufficient documentation

## 2014-03-05 DIAGNOSIS — I6523 Occlusion and stenosis of bilateral carotid arteries: Secondary | ICD-10-CM | POA: Diagnosis not present

## 2014-03-05 DIAGNOSIS — E119 Type 2 diabetes mellitus without complications: Secondary | ICD-10-CM | POA: Diagnosis not present

## 2014-03-05 NOTE — Progress Notes (Signed)
Carotid duplex performed 

## 2014-03-31 ENCOUNTER — Ambulatory Visit (INDEPENDENT_AMBULATORY_CARE_PROVIDER_SITE_OTHER): Payer: Medicare Other

## 2014-03-31 ENCOUNTER — Encounter: Payer: Self-pay | Admitting: Internal Medicine

## 2014-03-31 ENCOUNTER — Other Ambulatory Visit (INDEPENDENT_AMBULATORY_CARE_PROVIDER_SITE_OTHER): Payer: Medicare Other

## 2014-03-31 ENCOUNTER — Ambulatory Visit (INDEPENDENT_AMBULATORY_CARE_PROVIDER_SITE_OTHER): Payer: Medicare Other | Admitting: Internal Medicine

## 2014-03-31 VITALS — BP 130/80 | HR 60 | Temp 98.3°F | Resp 16 | Ht 62.0 in | Wt 140.0 lb

## 2014-03-31 DIAGNOSIS — I10 Essential (primary) hypertension: Secondary | ICD-10-CM

## 2014-03-31 DIAGNOSIS — I6529 Occlusion and stenosis of unspecified carotid artery: Secondary | ICD-10-CM | POA: Diagnosis not present

## 2014-03-31 DIAGNOSIS — Z23 Encounter for immunization: Secondary | ICD-10-CM

## 2014-03-31 DIAGNOSIS — E1149 Type 2 diabetes mellitus with other diabetic neurological complication: Secondary | ICD-10-CM

## 2014-03-31 LAB — HEMOGLOBIN A1C: Hgb A1c MFr Bld: 6.5 % (ref 4.6–6.5)

## 2014-03-31 LAB — URINALYSIS, ROUTINE W REFLEX MICROSCOPIC
Bilirubin Urine: NEGATIVE
KETONES UR: NEGATIVE
Nitrite: NEGATIVE
SPECIFIC GRAVITY, URINE: 1.01 (ref 1.000–1.030)
Total Protein, Urine: NEGATIVE
URINE GLUCOSE: NEGATIVE
Urobilinogen, UA: 0.2 (ref 0.0–1.0)
pH: 7 (ref 5.0–8.0)

## 2014-03-31 LAB — BASIC METABOLIC PANEL
BUN: 14 mg/dL (ref 6–23)
CO2: 26 meq/L (ref 19–32)
Calcium: 9.8 mg/dL (ref 8.4–10.5)
Chloride: 102 mEq/L (ref 96–112)
Creatinine, Ser: 0.7 mg/dL (ref 0.4–1.2)
GFR: 89.79 mL/min (ref >60.00)
GLUCOSE: 101 mg/dL — AB (ref 70–99)
POTASSIUM: 4.3 meq/L (ref 3.5–5.1)
SODIUM: 141 meq/L (ref 135–145)

## 2014-03-31 LAB — MICROALBUMIN / CREATININE URINE RATIO
Creatinine,U: 38.5 mg/dL
MICROALB/CREAT RATIO: 6 mg/g (ref 0.0–30.0)
Microalb, Ur: 2.3 mg/dL — ABNORMAL HIGH (ref 0.0–1.9)

## 2014-03-31 NOTE — Assessment & Plan Note (Signed)
Her BP is well controlled 

## 2014-03-31 NOTE — Assessment & Plan Note (Signed)
Her blood sugars are well controlled She has no signs of diabetic nephropathy

## 2014-03-31 NOTE — Patient Instructions (Signed)

## 2014-03-31 NOTE — Progress Notes (Signed)
Pre visit review using our clinic review tool, if applicable. No additional management support is needed unless otherwise documented below in the visit note. 

## 2014-03-31 NOTE — Progress Notes (Signed)
Subjective:    Patient ID: [name], female    DOB: 1941-04-05, 73 y.o.   MRN: [id number]  Diabetes She presents for her follow-up diabetic visit. She has type 2 diabetes mellitus. Her disease course has been stable. There are no hypoglycemic associated symptoms. Associated symptoms include foot paresthesias (tingling in her feet). Pertinent negatives for diabetes include no blurred vision, no chest pain, no fatigue, no foot ulcerations, no polydipsia, no polyphagia, no polyuria, no visual change, no weakness and no weight loss. There are no hypoglycemic complications. Symptoms are stable. Diabetic complications include peripheral neuropathy. Pertinent negatives for diabetic complications include no autonomic neuropathy, CVA, heart disease, impotence, nephropathy, PVD or retinopathy. Current diabetic treatment includes oral agent (monotherapy). She is compliant with treatment all of the time. Her weight is stable. She is following a generally healthy diet. Meal planning includes avoidance of concentrated sweets. She has not had a previous visit with a dietitian. She participates in exercise three times a week. There is no change in her home blood glucose trend. An ACE inhibitor/angiotensin II receptor blocker is not being taken. She does not see a podiatrist.Eye exam is current.      Review of Systems  Constitutional: Negative.  Negative for fever, chills, weight loss, diaphoresis and fatigue.  HENT: Negative.   Eyes: Negative.  Negative for blurred vision.  Respiratory: Negative.  Negative for cough, choking, chest tightness, shortness of breath and stridor.   Cardiovascular: Negative.  Negative for chest pain, palpitations and leg swelling.  Gastrointestinal: Negative.  Negative for nausea, vomiting, abdominal pain, diarrhea and constipation.  Endocrine: Negative.  Negative for polydipsia, polyphagia and polyuria.  Genitourinary: Negative.  Negative for impotence.  Musculoskeletal:  Negative.  Negative for back pain, joint swelling and arthralgias.  Skin: Negative.  Negative for rash.  Allergic/Immunologic: Negative.   Neurological: Negative.  Negative for weakness.  Hematological: Negative.  Negative for adenopathy. Does not bruise/bleed easily.  Psychiatric/Behavioral: Negative.        Objective:   Physical Exam  Constitutional: She is oriented to person, place, and time. She appears well-developed and well-nourished. No distress.  HENT:  Head: Normocephalic and atraumatic.  Mouth/Throat: Oropharynx is clear and moist. No oropharyngeal exudate.  Eyes: Conjunctivae are normal. Right eye exhibits no discharge. Left eye exhibits no discharge. No scleral icterus.  Neck: Normal range of motion. Neck supple. No JVD present. No tracheal deviation present. No thyromegaly present.  Cardiovascular: Normal rate, regular rhythm, normal heart sounds and intact distal pulses.  Exam reveals no gallop and no friction rub.   No murmur heard. Pulmonary/Chest: Effort normal and breath sounds normal. No stridor. No respiratory distress. She has no wheezes. She has no rales. She exhibits no tenderness.  Abdominal: Soft. Bowel sounds are normal. She exhibits no distension and no mass. There is no tenderness. There is no rebound and no guarding.  Musculoskeletal: Normal range of motion. She exhibits no edema or tenderness.  Lymphadenopathy:    She has no cervical adenopathy.  Neurological: She is oriented to person, place, and time.  Skin: Skin is warm and dry. No rash noted. She is not diaphoretic. No erythema. No pallor.  Psychiatric: She has a normal mood and affect. Her behavior is normal. Judgment and thought content normal.  Vitals reviewed.    Lab Results  Component Value Date   WBC 9.1 01/02/2011   HGB 14.2 01/02/2011   HCT 42.4 01/02/2011   PLT 331.0 01/02/2011   GLUCOSE 122* 11/26/2013  CHOL 128 07/22/2013   TRIG 148.0 07/22/2013   HDL 36.40* 07/22/2013    LDLDIRECT 81.6 12/31/2009   LDLCALC 62 07/22/2013   ALT 29 07/22/2013   AST 29 07/22/2013   NA 140 11/26/2013   K 4.6 11/26/2013   CL 103 11/26/2013   CREATININE 0.7 11/26/2013   BUN 21 11/26/2013   CO2 29 11/26/2013   TSH 1.34 01/02/2011   HGBA1C 6.5 11/26/2013       Assessment & Plan:

## 2014-04-29 DIAGNOSIS — H40003 Preglaucoma, unspecified, bilateral: Secondary | ICD-10-CM | POA: Diagnosis not present

## 2014-05-28 ENCOUNTER — Other Ambulatory Visit: Payer: Self-pay | Admitting: Internal Medicine

## 2014-06-28 ENCOUNTER — Other Ambulatory Visit: Payer: Self-pay | Admitting: Internal Medicine

## 2014-07-02 DIAGNOSIS — L309 Dermatitis, unspecified: Secondary | ICD-10-CM | POA: Diagnosis not present

## 2014-08-26 ENCOUNTER — Ambulatory Visit (INDEPENDENT_AMBULATORY_CARE_PROVIDER_SITE_OTHER): Payer: Medicare Other | Admitting: Internal Medicine

## 2014-08-26 ENCOUNTER — Ambulatory Visit (INDEPENDENT_AMBULATORY_CARE_PROVIDER_SITE_OTHER)
Admission: RE | Admit: 2014-08-26 | Discharge: 2014-08-26 | Disposition: A | Discharge status: Home / Self Care | Payer: Medicare Other | Source: Ambulatory Visit | Attending: Internal Medicine | Admitting: Internal Medicine

## 2014-08-26 ENCOUNTER — Encounter: Payer: Self-pay | Admitting: Internal Medicine

## 2014-08-26 VITALS — BP 142/80 | HR 52 | Temp 98.0°F | Resp 16 | Wt 141.0 lb

## 2014-08-26 DIAGNOSIS — M25552 Pain in left hip: Secondary | ICD-10-CM

## 2014-08-26 DIAGNOSIS — M25559 Pain in unspecified hip: Secondary | ICD-10-CM | POA: Insufficient documentation

## 2014-08-26 DIAGNOSIS — M1612 Unilateral primary osteoarthritis, left hip: Secondary | ICD-10-CM | POA: Diagnosis not present

## 2014-08-26 NOTE — Progress Notes (Signed)
Pre visit review using our clinic review tool, if applicable. No additional management support is needed unless otherwise documented below in the visit note. 

## 2014-08-26 NOTE — Progress Notes (Signed)
   Subjective:    Patient ID: [name], female    DOB: 1941/01/14, 74 y.o.   MRN: [id number]  HPI Comments: She complains of diffuse left hip pain for 3 days after doing a new exercise class - she does not recall a specific trauma or injury but says the pain started later the same day. She has taken aleve and got relief from the pain. She wants to have an xray done to see if there is a fracture.     Review of Systems  Constitutional: Negative for fever, chills, diaphoresis, appetite change and fatigue.  HENT: Negative.   Eyes: Negative.   Respiratory: Negative.  Negative for cough, choking, chest tightness, shortness of breath and stridor.   Cardiovascular: Negative.  Negative for chest pain, palpitations and leg swelling.  Gastrointestinal: Negative.  Negative for nausea, vomiting, abdominal pain, diarrhea, constipation and blood in stool.  Endocrine: Negative.   Genitourinary: Negative.  Negative for hematuria, flank pain and difficulty urinating.  Musculoskeletal: Positive for arthralgias. Negative for myalgias, back pain and neck pain.  Skin: Negative.  Negative for rash.  Allergic/Immunologic: Negative.   Neurological: Negative.   Hematological: Negative.  Negative for adenopathy. Does not bruise/bleed easily.  Psychiatric/Behavioral: Negative.        Objective:   Physical Exam  Constitutional: She is oriented to person, place, and time. She appears well-developed and well-nourished. No distress.  HENT:  Head: Normocephalic and atraumatic.  Mouth/Throat: Oropharynx is clear and moist. No oropharyngeal exudate.  Eyes: Conjunctivae are normal. Right eye exhibits no discharge. Left eye exhibits no discharge. No scleral icterus.  Neck: Normal range of motion. Neck supple. No JVD present. No tracheal deviation present. No thyromegaly present.  Cardiovascular: Normal rate, regular rhythm, normal heart sounds and intact distal pulses.  Exam reveals no gallop and no friction rub.     No murmur heard. Pulmonary/Chest: Effort normal and breath sounds normal. No stridor. No respiratory distress. She has no wheezes. She has no rales. She exhibits no tenderness.  Abdominal: Soft. Bowel sounds are normal. She exhibits no distension and no mass. There is no tenderness. There is no rebound and no guarding.  Musculoskeletal: Normal range of motion. She exhibits no edema or tenderness.       Left hip: Normal. She exhibits normal range of motion, normal strength, no tenderness, no bony tenderness, no swelling, no crepitus, no deformity and no laceration.  Lymphadenopathy:    She has no cervical adenopathy.  Neurological: She is oriented to person, place, and time.  Skin: Skin is warm and dry. No rash noted. She is not diaphoretic. No erythema. No pallor.  Vitals reviewed.    Lab Results  Component Value Date   WBC 9.1 01/02/2011   HGB 14.2 01/02/2011   HCT 42.4 01/02/2011   PLT 331.0 01/02/2011   GLUCOSE 101* 03/31/2014   CHOL 128 07/22/2013   TRIG 148.0 07/22/2013   HDL 36.40* 07/22/2013   LDLDIRECT 81.6 12/31/2009   LDLCALC 62 07/22/2013   ALT 29 07/22/2013   AST 29 07/22/2013   NA 141 03/31/2014   K 4.3 03/31/2014   CL 102 03/31/2014   CREATININE 0.7 03/31/2014   BUN 14 03/31/2014   CO2 26 03/31/2014   TSH 1.34 01/02/2011   HGBA1C 6.5 03/31/2014   MICROALBUR 2.3* 03/31/2014       Assessment & Plan:

## 2014-08-26 NOTE — Patient Instructions (Signed)
Hip Pain Your hip is the joint between your upper legs and your lower pelvis. The bones, cartilage, tendons, and muscles of your hip joint perform a lot of work each day supporting your body weight and allowing you to move around. Hip pain can range from a minor ache to severe pain in one or both of your hips. Pain may be felt on the inside of the hip joint near the groin, or the outside near the buttocks and upper thigh. You may have swelling or stiffness as well.  HOME CARE INSTRUCTIONS   Take medicines only as directed by your health care provider.  Apply ice to the injured area:  Put ice in a plastic bag.  Place a towel between your skin and the bag.  Leave the ice on for 15-20 minutes at a time, 3-4 times a day.  Keep your leg raised (elevated) when possible to lessen swelling.  Avoid activities that cause pain.  Follow specific exercises as directed by your health care provider.  Sleep with a pillow between your legs on your most comfortable side.  Record how often you have hip pain, the location of the pain, and what it feels like. SEEK MEDICAL CARE IF:   You are unable to put weight on your leg.  Your hip is red or swollen or very tender to touch.  Your pain or swelling continues or worsens after 1 week.  You have increasing difficulty walking.  You have a fever. SEEK IMMEDIATE MEDICAL CARE IF:   You have fallen.  You have a sudden increase in pain and swelling in your hip. MAKE SURE YOU:   Understand these instructions.  Will watch your condition.  Will get help right away if you are not doing well or get worse. Document Released: 10/26/2009 Document Revised: 09/22/2013 Document Reviewed: 01/02/2013 [employer] Patient Information 2015 [employer]. This information is not intended to replace advice given to you by your health care provider. Make sure you discuss any questions you have with your health care provider.  

## 2014-08-27 ENCOUNTER — Encounter: Payer: Self-pay | Admitting: Internal Medicine

## 2014-08-27 NOTE — Assessment & Plan Note (Signed)
Exam and Xray are WNL This appears to be a MS strain She will cont aleve as needed Activities as tolerated

## 2014-09-02 ENCOUNTER — Other Ambulatory Visit: Payer: Self-pay

## 2014-09-02 MED ORDER — SIMVASTATIN 20 MG PO TABS: 20.0000 mg | ORAL_TABLET | Freq: Every day | ORAL | Status: DC

## 2014-09-02 MED ORDER — METFORMIN HCL 500 MG PO TABS: 500.0000 mg | ORAL_TABLET | Freq: Two times a day (BID) | ORAL | Status: DC

## 2014-09-16 ENCOUNTER — Ambulatory Visit (INDEPENDENT_AMBULATORY_CARE_PROVIDER_SITE_OTHER): Payer: Medicare Other | Admitting: Internal Medicine

## 2014-09-16 ENCOUNTER — Telehealth: Payer: Self-pay | Admitting: Internal Medicine

## 2014-09-16 ENCOUNTER — Encounter: Payer: Self-pay | Admitting: Internal Medicine

## 2014-09-16 ENCOUNTER — Other Ambulatory Visit (INDEPENDENT_AMBULATORY_CARE_PROVIDER_SITE_OTHER): Payer: Medicare Other

## 2014-09-16 VITALS — BP 138/83 | HR 63 | Temp 98.2°F | Resp 16 | Ht 62.0 in | Wt 143.0 lb

## 2014-09-16 DIAGNOSIS — E1149 Type 2 diabetes mellitus with other diabetic neurological complication: Secondary | ICD-10-CM | POA: Diagnosis not present

## 2014-09-16 DIAGNOSIS — I10 Essential (primary) hypertension: Secondary | ICD-10-CM

## 2014-09-16 DIAGNOSIS — E785 Hyperlipidemia, unspecified: Secondary | ICD-10-CM

## 2014-09-16 DIAGNOSIS — Z1231 Encounter for screening mammogram for malignant neoplasm of breast: Secondary | ICD-10-CM | POA: Diagnosis not present

## 2014-09-16 LAB — HEMOGLOBIN A1C: HEMOGLOBIN A1C: 6.6 % — AB (ref 4.6–6.5)

## 2014-09-16 LAB — COMPREHENSIVE METABOLIC PANEL
ALBUMIN: 4.3 g/dL (ref 3.5–5.2)
ALT: 22 U/L (ref 0–35)
AST: 23 U/L (ref 0–37)
Alkaline Phosphatase: 68 U/L (ref 39–117)
BUN: 17 mg/dL (ref 6–23)
CO2: 29 meq/L (ref 19–32)
Calcium: 10.2 mg/dL (ref 8.4–10.5)
Chloride: 102 mEq/L (ref 96–112)
Creatinine, Ser: 1.21 mg/dL — ABNORMAL HIGH (ref 0.40–1.20)
GFR: 46.12 mL/min — AB (ref >60.00)
Glucose, Bld: 97 mg/dL (ref 70–99)
POTASSIUM: 4.3 meq/L (ref 3.5–5.1)
SODIUM: 140 meq/L (ref 135–145)
TOTAL PROTEIN: 7.2 g/dL (ref 6.0–8.3)
Total Bilirubin: 1.2 mg/dL (ref 0.2–1.2)

## 2014-09-16 LAB — CBC WITH DIFFERENTIAL/PLATELET
Basophils Absolute: 0 10*3/uL (ref 0.0–0.1)
Basophils Relative: 0.4 % (ref 0.0–3.0)
Eosinophils Absolute: 0.5 10*3/uL (ref 0.0–0.7)
Eosinophils Relative: 5.3 % — ABNORMAL HIGH (ref 0.0–5.0)
HCT: 38.9 % (ref 36.0–46.0)
Hemoglobin: 13.2 g/dL (ref 12.0–15.0)
Lymphocytes Relative: 32.6 % (ref 12.0–46.0)
Lymphs Abs: 2.9 10*3/uL (ref 0.7–4.0)
MCHC: 33.9 g/dL (ref 30.0–36.0)
MCV: 86.5 fl (ref 78.0–100.0)
MONO ABS: 0.8 10*3/uL (ref 0.1–1.0)
Monocytes Relative: 9.4 % (ref 3.0–12.0)
Neutro Abs: 4.6 10*3/uL (ref 1.4–7.7)
Neutrophils Relative %: 52.3 % (ref 43.0–77.0)
Platelets: 419 10*3/uL — ABNORMAL HIGH (ref 150.0–400.0)
RBC: 4.5 Mil/uL (ref 3.87–5.11)
RDW: 14 % (ref 11.5–15.5)
WBC: 8.8 10*3/uL (ref 4.0–10.5)

## 2014-09-16 LAB — LIPID PANEL
CHOLESTEROL: 131 mg/dL (ref 0–200)
HDL: 36.6 mg/dL — ABNORMAL LOW (ref >39.00)
LDL Cholesterol: 61 mg/dL (ref 0–99)
NonHDL: 94.4
Total CHOL/HDL Ratio: 4
Triglycerides: 166 mg/dL — ABNORMAL HIGH (ref 0.0–149.0)
VLDL: 33.2 mg/dL (ref 0.0–40.0)

## 2014-09-16 NOTE — Telephone Encounter (Signed)
Updated records...[name]

## 2014-09-16 NOTE — Patient Instructions (Signed)

## 2014-09-16 NOTE — Telephone Encounter (Signed)
Pt came in to let our office know that she had her mammogram and pap at Dr. Marijean Heath office on 12/26/2013. Please update on her chart.

## 2014-09-16 NOTE — Progress Notes (Signed)
Pre visit review using our clinic review tool, if applicable. No additional management support is needed unless otherwise documented below in the visit note. 

## 2014-09-17 ENCOUNTER — Encounter: Payer: Self-pay | Admitting: Internal Medicine

## 2014-09-17 ENCOUNTER — Ambulatory Visit: Payer: Medicare Other

## 2014-09-17 DIAGNOSIS — Z23 Encounter for immunization: Secondary | ICD-10-CM

## 2014-09-17 NOTE — Progress Notes (Signed)
Subjective:    Patient ID: [name], female    DOB: 1941-04-29, 74 y.o.   MRN: [id number]  Diabetes She presents for her follow-up diabetic visit. She has type 2 diabetes mellitus. Her disease course has been stable. There are no hypoglycemic associated symptoms. Pertinent negatives for hypoglycemia include no dizziness. There are no diabetic associated symptoms. Pertinent negatives for diabetes include no blurred vision, no chest pain, no fatigue, no foot paresthesias, no foot ulcerations, no polydipsia, no polyphagia, no polyuria, no visual change, no weakness and no weight loss. There are no hypoglycemic complications. 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 Current diabetic treatment includes oral agent (monotherapy). She is compliant with treatment all of the time. Her weight is stable. She is following a generally healthy diet. She participates in exercise intermittently. There is no change in her home blood glucose trend. An ACE inhibitor/angiotensin II receptor blocker is not being taken. She does not see a podiatrist.Eye exam is current.      Review of Systems  Constitutional: Negative.  Negative for fever, chills, weight loss, diaphoresis, appetite change and fatigue.  HENT: Negative.  Negative for trouble swallowing and voice change.   Eyes: Negative.  Negative for blurred vision.  Respiratory: Negative.  Negative for cough, choking, chest tightness, shortness of breath and stridor.   Cardiovascular: Negative.  Negative for chest pain, palpitations and leg swelling.  Gastrointestinal: Negative.  Negative for nausea, vomiting, abdominal pain, diarrhea, constipation and blood in stool.  Endocrine: Negative.  Negative for polydipsia, polyphagia and polyuria.  Genitourinary: Negative.   Musculoskeletal: Negative.  Negative for myalgias, back pain, joint swelling and arthralgias.  Skin: Negative.  Negative for rash.  Allergic/Immunologic: Negative.     Neurological: Negative.  Negative for dizziness and weakness.  Hematological: Negative.  Negative for adenopathy. Does not bruise/bleed easily.  Psychiatric/Behavioral: Negative.        Objective:   Physical Exam  Constitutional: She is oriented to person, place, and time. She appears well-developed and well-nourished. No distress.  HENT:  Head: Normocephalic and atraumatic.  Mouth/Throat: Oropharynx is clear and moist. No oropharyngeal exudate.  Eyes: Conjunctivae are normal. Right eye exhibits no discharge. Left eye exhibits no discharge. No scleral icterus.  Neck: Normal range of motion. Neck supple. No JVD present. No tracheal deviation present. No thyromegaly present.  Cardiovascular: Normal rate, regular rhythm, normal heart sounds and intact distal pulses.  Exam reveals no gallop and no friction rub.   No murmur heard. Pulmonary/Chest: Effort normal and breath sounds normal. No stridor. No respiratory distress. She has no wheezes. She has no rales. She exhibits no tenderness.  Abdominal: Soft. Bowel sounds are normal. She exhibits no distension and no mass. There is no tenderness. There is no rebound and no guarding.  Musculoskeletal: Normal range of motion. She exhibits no edema or tenderness.  Lymphadenopathy:    She has no cervical adenopathy.  Neurological: She is oriented to person, place, and time.  Skin: Skin is warm and dry. No rash noted. She is not diaphoretic. No erythema. No pallor.  Vitals reviewed.    Lab Results  Component Value Date   WBC 8.8 09/16/2014   HGB 13.2 09/16/2014   HCT 38.9 09/16/2014   PLT 419.0* 09/16/2014   GLUCOSE 97 09/16/2014   CHOL 131 09/16/2014   TRIG 166.0* 09/16/2014   HDL 36.60* 09/16/2014   LDLDIRECT 81.6 12/31/2009   LDLCALC 61 09/16/2014   ALT 22 09/16/2014   AST 23 09/16/2014  NA 140 09/16/2014   K 4.3 09/16/2014   CL 102 09/16/2014   CREATININE 1.21* 09/16/2014   BUN 17 09/16/2014   CO2 29 09/16/2014   TSH 1.34  01/02/2011   HGBA1C 6.6* 09/16/2014   MICROALBUR 2.3* 03/31/2014       Assessment & Plan:

## 2014-09-17 NOTE — Assessment & Plan Note (Signed)
She has achieved her LDL goal and is doing well on the statin

## 2014-09-17 NOTE — Assessment & Plan Note (Signed)
Her BP is well controled I will monitor her lytes and renal function

## 2014-09-17 NOTE — Assessment & Plan Note (Signed)
Her blood sugars are well controlled Her renal function has declined some but I think she can cont the metformin for now

## 2014-09-29 ENCOUNTER — Ambulatory Visit: Payer: Medicare Other | Admitting: Internal Medicine

## 2014-11-04 DIAGNOSIS — E119 Type 2 diabetes mellitus without complications: Secondary | ICD-10-CM | POA: Diagnosis not present

## 2014-11-04 DIAGNOSIS — H40053 Ocular hypertension, bilateral: Secondary | ICD-10-CM | POA: Diagnosis not present

## 2014-11-04 DIAGNOSIS — H52221 Regular astigmatism, right eye: Secondary | ICD-10-CM | POA: Diagnosis not present

## 2014-11-04 DIAGNOSIS — H5213 Myopia, bilateral: Secondary | ICD-10-CM | POA: Diagnosis not present

## 2014-11-04 DIAGNOSIS — H25813 Combined forms of age-related cataract, bilateral: Secondary | ICD-10-CM | POA: Diagnosis not present

## 2014-11-04 DIAGNOSIS — H40009 Preglaucoma, unspecified, unspecified eye: Secondary | ICD-10-CM | POA: Diagnosis not present

## 2014-11-04 LAB — HM DIABETES EYE EXAM

## 2014-11-19 ENCOUNTER — Encounter: Payer: Self-pay | Admitting: Internal Medicine

## 2014-11-26 ENCOUNTER — Other Ambulatory Visit: Payer: Self-pay

## 2014-11-26 DIAGNOSIS — L249 Irritant contact dermatitis, unspecified cause: Secondary | ICD-10-CM | POA: Diagnosis not present

## 2014-11-26 MED ORDER — PROPRANOLOL-HCTZ 40-25 MG PO TABS: 1.0000 | ORAL_TABLET | Freq: Every day | ORAL | Status: DC

## 2014-12-10 DIAGNOSIS — I10 Essential (primary) hypertension: Secondary | ICD-10-CM | POA: Diagnosis not present

## 2014-12-10 DIAGNOSIS — M542 Cervicalgia: Secondary | ICD-10-CM | POA: Diagnosis not present

## 2014-12-10 DIAGNOSIS — Z6827 Body mass index (BMI) 27.0-27.9, adult: Secondary | ICD-10-CM | POA: Diagnosis not present

## 2014-12-10 DIAGNOSIS — M5412 Radiculopathy, cervical region: Secondary | ICD-10-CM | POA: Diagnosis not present

## 2014-12-12 DIAGNOSIS — M47812 Spondylosis without myelopathy or radiculopathy, cervical region: Secondary | ICD-10-CM | POA: Diagnosis not present

## 2014-12-12 DIAGNOSIS — M5021 Other cervical disc displacement,  high cervical region: Secondary | ICD-10-CM | POA: Diagnosis not present

## 2014-12-12 DIAGNOSIS — M5412 Radiculopathy, cervical region: Secondary | ICD-10-CM | POA: Diagnosis not present

## 2014-12-12 DIAGNOSIS — M5022 Other cervical disc displacement, mid-cervical region: Secondary | ICD-10-CM | POA: Diagnosis not present

## 2014-12-12 DIAGNOSIS — M9971 Connective tissue and disc stenosis of intervertebral foramina of cervical region: Secondary | ICD-10-CM | POA: Diagnosis not present

## 2014-12-15 ENCOUNTER — Other Ambulatory Visit: Payer: Self-pay | Admitting: Neurosurgery

## 2014-12-15 DIAGNOSIS — I10 Essential (primary) hypertension: Secondary | ICD-10-CM | POA: Diagnosis not present

## 2014-12-15 DIAGNOSIS — M4802 Spinal stenosis, cervical region: Secondary | ICD-10-CM | POA: Diagnosis not present

## 2014-12-15 DIAGNOSIS — Z6828 Body mass index (BMI) 28.0-28.9, adult: Secondary | ICD-10-CM | POA: Diagnosis not present

## 2014-12-18 ENCOUNTER — Encounter (HOSPITAL_COMMUNITY)
Admission: RE | Admit: 2014-12-18 | Discharge: 2014-12-18 | Disposition: A | Discharge status: Home / Self Care | Payer: Medicare Other | Source: Ambulatory Visit | Attending: Neurosurgery | Admitting: Neurosurgery

## 2014-12-18 ENCOUNTER — Encounter (HOSPITAL_COMMUNITY): Payer: Self-pay

## 2014-12-18 DIAGNOSIS — I10 Essential (primary) hypertension: Secondary | ICD-10-CM | POA: Insufficient documentation

## 2014-12-18 DIAGNOSIS — E119 Type 2 diabetes mellitus without complications: Secondary | ICD-10-CM | POA: Diagnosis not present

## 2014-12-18 DIAGNOSIS — Z01812 Encounter for preprocedural laboratory examination: Secondary | ICD-10-CM | POA: Diagnosis not present

## 2014-12-18 DIAGNOSIS — I872 Venous insufficiency (chronic) (peripheral): Secondary | ICD-10-CM | POA: Insufficient documentation

## 2014-12-18 DIAGNOSIS — M4802 Spinal stenosis, cervical region: Secondary | ICD-10-CM | POA: Insufficient documentation

## 2014-12-18 DIAGNOSIS — Z79899 Other long term (current) drug therapy: Secondary | ICD-10-CM | POA: Insufficient documentation

## 2014-12-18 DIAGNOSIS — R9431 Abnormal electrocardiogram [ECG] [EKG]: Secondary | ICD-10-CM | POA: Insufficient documentation

## 2014-12-18 HISTORY — DX: Family history of other specified eye disorder: Z83.518

## 2014-12-18 LAB — CBC
HCT: 39.6 % (ref 36.0–46.0)
Hemoglobin: 13.2 g/dL (ref 12.0–15.0)
MCH: 29.3 pg (ref 26.0–34.0)
MCHC: 33.3 g/dL (ref 30.0–36.0)
MCV: 88 fL (ref 78.0–100.0)
PLATELETS: 369 10*3/uL (ref 150–400)
RBC: 4.5 MIL/uL (ref 3.87–5.11)
RDW: 13.6 % (ref 11.5–15.5)
WBC: 7.9 10*3/uL (ref 4.0–10.5)

## 2014-12-18 LAB — BASIC METABOLIC PANEL
Anion gap: 9 (ref 5–15)
BUN: 15 mg/dL (ref 6–20)
CO2: 30 mmol/L (ref 22–32)
CREATININE: 0.84 mg/dL (ref 0.44–1.00)
Calcium: 9.9 mg/dL (ref 8.9–10.3)
Chloride: 101 mmol/L (ref 101–111)
GFR calc Af Amer: >60 mL/min (ref >60)
GFR calc non Af Amer: >60 mL/min (ref >60)
Glucose, Bld: 106 mg/dL — ABNORMAL HIGH (ref 65–99)
Potassium: 4.1 mmol/L (ref 3.5–5.1)
Sodium: 140 mmol/L (ref 135–145)

## 2014-12-18 LAB — GLUCOSE, CAPILLARY: Glucose-Capillary: 111 mg/dL — ABNORMAL HIGH (ref 65–99)

## 2014-12-18 LAB — SURGICAL PCR SCREEN
MRSA, PCR: NEGATIVE
Staphylococcus aureus: POSITIVE — AB

## 2014-12-18 NOTE — Progress Notes (Addendum)
Notified Jessica at Dr. Harley Hallmark office that orders need to be signed.  PCP: Dr. Ria Bush  [facility]upirocin ointment called [facility].

## 2014-12-18 NOTE — Pre-Procedure Instructions (Signed)
    [name]  12/18/2014      [id number] [place], [place] [address] [place] [address] Phone: [phone] Fax: [phone]    Your procedure is scheduled on Tuesday, Aug. 2  Report to [facility] Main Entrance "A" at 8:30 A.M.  Call this number if you have problems the morning of surgery:  [phone]              Any questions prior to surgery call [phone] Monday- Friday 8 AM-4PM   Remember:  Do not eat food or drink liquids after midnight Tuesday Aug. 1  Take these medicines the morning of surgery with A SIP OF WATER:  propranolol-hydrochlorotiazide (inderide)              Stop naproxen sodium.              Do not take any diabetic medicine the day of surgery.   Do not wear jewelry, make-up or nail polish.  Do not wear lotions, powders, or perfumes.  You may wear deodorant.  Do not shave 48 hours prior to surgery.  Men may shave face and neck.  Do not bring valuables to the hospital.  [facility] is not responsible for any belongings or valuables.  Contacts, dentures or bridgework may not be worn into surgery.  Leave your suitcase in the car.  After surgery it may be brought to your room.  For patients admitted to the hospital, discharge time will be determined by your treatment team.  Patients discharged the day of surgery will not be allowed to drive home.   Name and phone number of your driver:    Special instructions: review preparing for surgery handout  Please read over the following fact sheets that you were given. Pain Booklet, Coughing and Deep Breathing, MRSA Information and Surgical Site Infection Prevention

## 2014-12-19 LAB — HEMOGLOBIN A1C
HEMOGLOBIN A1C: 6.7 % — AB (ref 4.8–5.6)
Mean Plasma Glucose: 146 mg/dL

## 2014-12-21 MED ORDER — VANCOMYCIN HCL IN DEXTROSE 1-5 GM/200ML-% IV SOLN
1000.0000 mg | INTRAVENOUS | Status: AC
  Administered 2014-12-22 (×2): 1000 mg via INTRAVENOUS
  Filled 2014-12-21: qty 200

## 2014-12-21 NOTE — Progress Notes (Signed)
Anesthesia Chart Review: Patient is a 74 year old female scheduled for C4-5, C5-6, C6-7 ACDF on 12/22/14 by Dr. Joya Salm.  History includes former smoker, DM2, carotid artery disease, venous insufficiency, sciatica, HLD, HTN, appendectomy, lumbar laminectomy. PCP is Dr. Scarlette Calico.  Meds include metformin, propranolol-HCTZ, Zocor.  12/18/14 EKG: NSR, cannot rule out anterior infarct (age undetermined). No significant change since last tracing on 01/02/11.  03/05/14 Carotid duplex: 1-39% RICA stenosis, 38-10% LICA stenosis, patent vertebral arteries with antegrade flow, normal SCA bilaterally.  Preoperative labs noted. A1C 6.7.  If no acute changes then I anticipate that she can proceed as planned.  George Hugh [facility] Stay [facility]/Anesthesiology Phone [phone] 12/21/2014 9:28 AM

## 2014-12-21 NOTE — Anesthesia Preprocedure Evaluation (Addendum)
Anesthesia Evaluation  Patient identified by MRN, date of birth, ID band Patient awake    Reviewed: Allergy & Precautions, NPO status , Patient's Chart, lab work & pertinent test results, reviewed documented beta blocker date and time   Airway Mallampati: II   Neck ROM: Full    Dental  (+) Teeth Intact, Dental Advisory Given   Pulmonary former smoker (quit 97),  breath sounds clear to auscultation        Cardiovascular hypertension, Pt. on medications + Peripheral Vascular Disease Rhythm:Regular  EKG poss ant MI, no resent changes   Neuro/Psych Carotids R<39% stenosis, L <59% Stenosis    GI/Hepatic   Endo/Other  diabetes, Type 2, Oral Hypoglycemic Agents  Renal/GU      Musculoskeletal   Abdominal (+)  Abdomen: soft.    Peds  Hematology 13/39   Anesthesia Other Findings   Reproductive/Obstetrics                            Anesthesia Physical Anesthesia Plan  ASA: III  Anesthesia Plan: General   Post-op Pain Management:    Induction: Intravenous  Airway Management Planned: Oral ETT and Video Laryngoscope Planned  Additional Equipment:   Intra-op Plan:   Post-operative Plan: Extubation in OR  Informed Consent: I have reviewed the patients History and Physical, chart, labs and discussed the procedure including the risks, benefits and alternatives for the proposed anesthesia with the patient or authorized representative who has indicated his/her understanding and acceptance.     Plan Discussed with:   Anesthesia Plan Comments: (GLIDE, 2nd IV, multimodal pain RX, no Toradol)        Anesthesia Quick Evaluation

## 2014-12-21 NOTE — H&P (Signed)
[name] is an 74 y.o. female.   Chief Complaint: pain to right arm HPI: for several weeks the patient has been complaining of neck pain with radiation to the right arm associated with burning sensation, worse at night time.   Past Medical History  Diagnosis Date  . Type II or unspecified type diabetes mellitus without mention of complication, uncontrolled   . Routine general medical examination at a health care facility   . Occlusion and stenosis of carotid artery without mention of cerebral infarction   . Unspecified venous (peripheral) insufficiency   . Displacement of lumbar intervertebral disc without myelopathy   . Sciatica   . Pain in limb   . Pain in joint, shoulder region   . Contact dermatitis and other eczema, due to unspecified cause   . Gout, unspecified   . Other and unspecified hyperlipidemia   . Unspecified essential hypertension   . FH: cataracts     Past Surgical History  Procedure Laterality Date  . Laparoscopic appendectomy    . Tubal ligation    . Lumbar laminectomy  07/2008    Jasmina Gendron    Family History  Problem Relation Age of Onset  . Alzheimer's disease Mother   . Diabetes Mother   . Coronary artery disease Father     CABG  . Hypertension Father   . Colon cancer Neg Hx   . Breast cancer Neg Hx   . Cancer Neg Hx     breast or colon   Social History:  reports that she quit smoking about 19 years ago. She does not have any smokeless tobacco history on file. She reports that she does not drink alcohol or use illicit drugs.  Allergies:  Allergies  Allergen Reactions  . Ampicillin Other (See Comments)    REACTION: causes urticaria  . Neomycin-Bacitracin Zn-Polymyx Rash  . Penicillins Other (See Comments)    REACTION: causes urticaria    No prescriptions prior to admission    No results found for this or any previous visit (from the past 48 hour(s)). No results found.  Review of Systems  Constitutional: Negative.   Eyes: Negative.    Respiratory: Negative.   Cardiovascular: Negative.   Gastrointestinal: Negative.   Genitourinary: Negative.   Musculoskeletal: Positive for neck pain.  Skin: Negative.   Neurological: Positive for focal weakness.  Endo/Heme/Allergies: Negative.   Psychiatric/Behavioral: Negative.     There were no vitals taken for this visit. Physical Exam hent, nl. Neck, decrease of flexibility secondary to pain. Cv, nl. Lungs, clear. Abdomen, soft. Extremities, nl. Scar in lumbar area. NEURO weakness of biceps and wrist extensor on the right. Sensory affecting the c6 dermatome. Dtr, nl. Mri shows ddd at 67 to right, 82 bilaterally and c67 to the right  Assessment/Plan Patient wants to go ahead with surgery which will be a acdf from c4 to c7. she is aware of risks and benefits  Lydiana Milley M 12/21/2014, 5:28 PM

## 2014-12-22 ENCOUNTER — Encounter (HOSPITAL_COMMUNITY): Payer: Self-pay | Admitting: *Deleted

## 2014-12-22 ENCOUNTER — Inpatient Hospital Stay (HOSPITAL_COMMUNITY): Payer: Medicare Other | Admitting: Vascular Surgery

## 2014-12-22 ENCOUNTER — Encounter (HOSPITAL_COMMUNITY)
Admission: RE | Disposition: A | Discharge status: Home / Self Care | Payer: Self-pay | Source: Ambulatory Visit | Attending: Neurosurgery

## 2014-12-22 ENCOUNTER — Inpatient Hospital Stay (HOSPITAL_COMMUNITY): Payer: Medicare Other

## 2014-12-22 ENCOUNTER — Inpatient Hospital Stay (HOSPITAL_COMMUNITY): Payer: Medicare Other | Admitting: Anesthesiology

## 2014-12-22 ENCOUNTER — Inpatient Hospital Stay (HOSPITAL_COMMUNITY)
Admission: RE | Admit: 2014-12-22 | Discharge: 2014-12-24 | DRG: 473 | Disposition: A | Discharge status: Home / Self Care | Payer: Medicare Other | Source: Ambulatory Visit | Attending: Neurosurgery | Admitting: Neurosurgery

## 2014-12-22 DIAGNOSIS — E785 Hyperlipidemia, unspecified: Secondary | ICD-10-CM | POA: Diagnosis present

## 2014-12-22 DIAGNOSIS — I10 Essential (primary) hypertension: Secondary | ICD-10-CM | POA: Diagnosis present

## 2014-12-22 DIAGNOSIS — M4722 Other spondylosis with radiculopathy, cervical region: Secondary | ICD-10-CM | POA: Diagnosis present

## 2014-12-22 DIAGNOSIS — Z87891 Personal history of nicotine dependence: Secondary | ICD-10-CM | POA: Diagnosis not present

## 2014-12-22 DIAGNOSIS — Z880 Allergy status to penicillin: Secondary | ICD-10-CM | POA: Diagnosis not present

## 2014-12-22 DIAGNOSIS — M47812 Spondylosis without myelopathy or radiculopathy, cervical region: Secondary | ICD-10-CM | POA: Diagnosis not present

## 2014-12-22 DIAGNOSIS — Z888 Allergy status to other drugs, medicaments and biological substances status: Secondary | ICD-10-CM

## 2014-12-22 DIAGNOSIS — M542 Cervicalgia: Secondary | ICD-10-CM | POA: Diagnosis not present

## 2014-12-22 DIAGNOSIS — Z981 Arthrodesis status: Secondary | ICD-10-CM | POA: Diagnosis not present

## 2014-12-22 DIAGNOSIS — Z419 Encounter for procedure for purposes other than remedying health state, unspecified: Secondary | ICD-10-CM

## 2014-12-22 DIAGNOSIS — E119 Type 2 diabetes mellitus without complications: Secondary | ICD-10-CM | POA: Diagnosis present

## 2014-12-22 DIAGNOSIS — Z4889 Encounter for other specified surgical aftercare: Secondary | ICD-10-CM | POA: Diagnosis not present

## 2014-12-22 DIAGNOSIS — M4802 Spinal stenosis, cervical region: Secondary | ICD-10-CM | POA: Diagnosis present

## 2014-12-22 HISTORY — PX: ANTERIOR CERVICAL DECOMP/DISCECTOMY FUSION: SHX1161

## 2014-12-22 LAB — GLUCOSE, CAPILLARY
GLUCOSE-CAPILLARY: 136 mg/dL — AB (ref 65–99)
GLUCOSE-CAPILLARY: 147 mg/dL — AB (ref 65–99)
Glucose-Capillary: 119 mg/dL — ABNORMAL HIGH (ref 65–99)

## 2014-12-22 SURGERY — ANTERIOR CERVICAL DECOMPRESSION/DISCECTOMY FUSION 3 LEVELS
Anesthesia: General | Site: Neck

## 2014-12-22 MED ORDER — SODIUM CHLORIDE 0.9 % IV SOLN: 250.0000 mL | INTRAVENOUS | Status: DC

## 2014-12-22 MED ORDER — MEPERIDINE HCL 25 MG/ML IJ SOLN: 6.2500 mg | INTRAMUSCULAR | Status: DC | PRN

## 2014-12-22 MED ORDER — PHENYLEPHRINE HCL 10 MG/ML IJ SOLN
10.0000 mg | INTRAMUSCULAR | Status: DC | PRN
  Administered 2014-12-22: 10 ug/min via INTRAVENOUS

## 2014-12-22 MED ORDER — PROPOFOL 10 MG/ML IV BOLUS
INTRAVENOUS | Status: DC | PRN
  Administered 2014-12-22: 120 mg via INTRAVENOUS

## 2014-12-22 MED ORDER — ACETAMINOPHEN 650 MG RE SUPP: 650.0000 mg | RECTAL | Status: DC | PRN

## 2014-12-22 MED ORDER — NEOSTIGMINE METHYLSULFATE 10 MG/10ML IV SOLN
INTRAVENOUS | Status: AC
  Filled 2014-12-22: qty 1

## 2014-12-22 MED ORDER — HYDRALAZINE HCL 20 MG/ML IJ SOLN
10.0000 mg | Freq: Once | INTRAMUSCULAR | Status: AC
  Administered 2014-12-22: 10 mg via INTRAVENOUS

## 2014-12-22 MED ORDER — ARTIFICIAL TEARS OP OINT
TOPICAL_OINTMENT | OPHTHALMIC | Status: AC
  Filled 2014-12-22: qty 3.5

## 2014-12-22 MED ORDER — FENTANYL CITRATE (PF) 100 MCG/2ML IJ SOLN
INTRAMUSCULAR | Status: AC
  Filled 2014-12-22: qty 2

## 2014-12-22 MED ORDER — PROPOFOL 10 MG/ML IV BOLUS
INTRAVENOUS | Status: AC
  Filled 2014-12-22: qty 20

## 2014-12-22 MED ORDER — THROMBIN 5000 UNITS EX SOLR
OROMUCOSAL | Status: DC | PRN
  Administered 2014-12-22: 5 mL via TOPICAL

## 2014-12-22 MED ORDER — EPHEDRINE SULFATE 50 MG/ML IJ SOLN
INTRAMUSCULAR | Status: DC | PRN
  Administered 2014-12-22: 5 mg via INTRAVENOUS

## 2014-12-22 MED ORDER — MIDAZOLAM HCL 2 MG/2ML IJ SOLN
INTRAMUSCULAR | Status: AC
  Filled 2014-12-22: qty 4

## 2014-12-22 MED ORDER — PROMETHAZINE HCL 25 MG/ML IJ SOLN: 6.2500 mg | INTRAMUSCULAR | Status: DC | PRN

## 2014-12-22 MED ORDER — ROCURONIUM BROMIDE 50 MG/5ML IV SOLN
INTRAVENOUS | Status: AC
  Filled 2014-12-22: qty 1

## 2014-12-22 MED ORDER — ONDANSETRON HCL 4 MG/2ML IJ SOLN
INTRAMUSCULAR | Status: AC
  Filled 2014-12-22: qty 2

## 2014-12-22 MED ORDER — MIDAZOLAM HCL 5 MG/5ML IJ SOLN
INTRAMUSCULAR | Status: DC | PRN
  Administered 2014-12-22 (×2): 1 mg via INTRAVENOUS

## 2014-12-22 MED ORDER — LIDOCAINE HCL (CARDIAC) 20 MG/ML IV SOLN
INTRAVENOUS | Status: AC
  Filled 2014-12-22: qty 5

## 2014-12-22 MED ORDER — SODIUM CHLORIDE 0.9 % IR SOLN
Status: DC | PRN
  Administered 2014-12-22: 1000 mL

## 2014-12-22 MED ORDER — NEOSTIGMINE METHYLSULFATE 10 MG/10ML IV SOLN
INTRAVENOUS | Status: DC | PRN
  Administered 2014-12-22: 3 mg via INTRAVENOUS

## 2014-12-22 MED ORDER — FENTANYL CITRATE (PF) 100 MCG/2ML IJ SOLN
25.0000 ug | INTRAMUSCULAR | Status: DC | PRN
  Administered 2014-12-22 (×3): 50 ug via INTRAVENOUS

## 2014-12-22 MED ORDER — CEFAZOLIN SODIUM 1-5 GM-% IV SOLN: 1.0000 g | Freq: Three times a day (TID) | INTRAVENOUS | Status: DC

## 2014-12-22 MED ORDER — OXYCODONE-ACETAMINOPHEN 5-325 MG PO TABS
1.0000 | ORAL_TABLET | ORAL | Status: DC | PRN
  Administered 2014-12-22 – 2014-12-23 (×3): 1 via ORAL
  Filled 2014-12-22: qty 2
  Filled 2014-12-22 (×2): qty 1

## 2014-12-22 MED ORDER — ROCURONIUM BROMIDE 100 MG/10ML IV SOLN
INTRAVENOUS | Status: DC | PRN
  Administered 2014-12-22: 40 mg via INTRAVENOUS

## 2014-12-22 MED ORDER — VANCOMYCIN HCL 10 G IV SOLR
1250.0000 mg | INTRAVENOUS | Status: DC
  Administered 2014-12-23: 1250 mg via INTRAVENOUS
  Filled 2014-12-22 (×2): qty 1250

## 2014-12-22 MED ORDER — METFORMIN HCL 500 MG PO TABS
500.0000 mg | ORAL_TABLET | Freq: Two times a day (BID) | ORAL | Status: DC
  Administered 2014-12-23 – 2014-12-24 (×3): 500 mg via ORAL
  Filled 2014-12-22 (×4): qty 1

## 2014-12-22 MED ORDER — PROPRANOLOL-HCTZ 40-25 MG PO TABS: 1.0000 | ORAL_TABLET | Freq: Every day | ORAL | Status: DC

## 2014-12-22 MED ORDER — ACETAMINOPHEN 325 MG PO TABS
650.0000 mg | ORAL_TABLET | ORAL | Status: DC | PRN
  Administered 2014-12-23: 650 mg via ORAL
  Filled 2014-12-22: qty 2

## 2014-12-22 MED ORDER — LACTATED RINGERS IV SOLN
INTRAVENOUS | Status: DC | PRN
  Administered 2014-12-22 (×3): via INTRAVENOUS

## 2014-12-22 MED ORDER — MORPHINE SULFATE 2 MG/ML IJ SOLN: 1.0000 mg | INTRAMUSCULAR | Status: DC | PRN

## 2014-12-22 MED ORDER — LACTATED RINGERS IV SOLN
INTRAVENOUS | Status: DC
  Administered 2014-12-22: 09:00:00 via INTRAVENOUS

## 2014-12-22 MED ORDER — ONDANSETRON HCL 4 MG/2ML IJ SOLN: 4.0000 mg | INTRAMUSCULAR | Status: DC | PRN

## 2014-12-22 MED ORDER — DEXAMETHASONE 4 MG PO TABS
4.0000 mg | ORAL_TABLET | Freq: Four times a day (QID) | ORAL | Status: DC
  Administered 2014-12-23 – 2014-12-24 (×4): 4 mg via ORAL
  Filled 2014-12-22 (×2): qty 1

## 2014-12-22 MED ORDER — PROPRANOLOL HCL 40 MG PO TABS
40.0000 mg | ORAL_TABLET | Freq: Every day | ORAL | Status: DC
  Administered 2014-12-23 – 2014-12-24 (×2): 40 mg via ORAL
  Filled 2014-12-22 (×3): qty 1

## 2014-12-22 MED ORDER — PHENOL 1.4 % MT LIQD: 1.0000 | OROMUCOSAL | Status: DC | PRN

## 2014-12-22 MED ORDER — ONDANSETRON HCL 4 MG/2ML IJ SOLN
INTRAMUSCULAR | Status: DC | PRN
  Administered 2014-12-22: 4 mg via INTRAVENOUS

## 2014-12-22 MED ORDER — SODIUM CHLORIDE 0.9 % IJ SOLN
3.0000 mL | Freq: Two times a day (BID) | INTRAMUSCULAR | Status: DC
  Administered 2014-12-23 – 2014-12-24 (×2): 3 mL via INTRAVENOUS

## 2014-12-22 MED ORDER — THROMBIN 20000 UNITS EX SOLR
CUTANEOUS | Status: DC | PRN
  Administered 2014-12-22: 10 mL via TOPICAL

## 2014-12-22 MED ORDER — VANCOMYCIN HCL IN DEXTROSE 1-5 GM/200ML-% IV SOLN
1000.0000 mg | Freq: Once | INTRAVENOUS | Status: DC
  Filled 2014-12-22: qty 200

## 2014-12-22 MED ORDER — SIMVASTATIN 20 MG PO TABS
20.0000 mg | ORAL_TABLET | Freq: Every day | ORAL | Status: DC
  Administered 2014-12-23: 20 mg via ORAL
  Filled 2014-12-22 (×2): qty 1

## 2014-12-22 MED ORDER — DIAZEPAM 5 MG PO TABS
5.0000 mg | ORAL_TABLET | Freq: Four times a day (QID) | ORAL | Status: DC | PRN
  Filled 2014-12-22: qty 1

## 2014-12-22 MED ORDER — LIDOCAINE HCL (CARDIAC) 20 MG/ML IV SOLN
INTRAVENOUS | Status: DC | PRN
  Administered 2014-12-22: 100 mg via INTRAVENOUS

## 2014-12-22 MED ORDER — FENTANYL CITRATE (PF) 100 MCG/2ML IJ SOLN
INTRAMUSCULAR | Status: DC | PRN
  Administered 2014-12-22: 100 ug via INTRAVENOUS
  Administered 2014-12-22: 50 ug via INTRAVENOUS
  Administered 2014-12-22: 100 ug via INTRAVENOUS

## 2014-12-22 MED ORDER — HYDRALAZINE HCL 20 MG/ML IJ SOLN
INTRAMUSCULAR | Status: AC
  Filled 2014-12-22: qty 1

## 2014-12-22 MED ORDER — SODIUM CHLORIDE 0.9 % IV SOLN
INTRAVENOUS | Status: DC
  Administered 2014-12-22: 19:00:00 via INTRAVENOUS

## 2014-12-22 MED ORDER — FENTANYL CITRATE (PF) 250 MCG/5ML IJ SOLN
INTRAMUSCULAR | Status: AC
  Filled 2014-12-22: qty 5

## 2014-12-22 MED ORDER — GLYCOPYRROLATE 0.2 MG/ML IJ SOLN
INTRAMUSCULAR | Status: DC | PRN
  Administered 2014-12-22: 0.4 mg via INTRAVENOUS

## 2014-12-22 MED ORDER — SODIUM CHLORIDE 0.9 % IJ SOLN
3.0000 mL | INTRAMUSCULAR | Status: DC | PRN
Start: 2014-12-22 — End: 2014-12-24

## 2014-12-22 MED ORDER — ARTIFICIAL TEARS OP OINT
TOPICAL_OINTMENT | OPHTHALMIC | Status: DC | PRN
  Administered 2014-12-22: 1 via OPHTHALMIC

## 2014-12-22 MED ORDER — GLYCOPYRROLATE 0.2 MG/ML IJ SOLN
INTRAMUSCULAR | Status: AC
  Filled 2014-12-22: qty 2

## 2014-12-22 MED ORDER — MENTHOL 3 MG MT LOZG: 1.0000 | LOZENGE | OROMUCOSAL | Status: DC | PRN

## 2014-12-22 MED ORDER — HYDROCHLOROTHIAZIDE 25 MG PO TABS
25.0000 mg | ORAL_TABLET | Freq: Every day | ORAL | Status: DC
  Administered 2014-12-23 – 2014-12-24 (×2): 25 mg via ORAL
  Filled 2014-12-22 (×2): qty 1

## 2014-12-22 MED ORDER — DEXAMETHASONE SODIUM PHOSPHATE 4 MG/ML IJ SOLN
4.0000 mg | Freq: Four times a day (QID) | INTRAMUSCULAR | Status: DC
  Administered 2014-12-22 – 2014-12-23 (×3): 4 mg via INTRAVENOUS
  Filled 2014-12-22 (×4): qty 1

## 2014-12-22 SURGICAL SUPPLY — 70 items
APL SKNCLS STERI-STRIP NONHPOA (GAUZE/BANDAGES/DRESSINGS) ×1
BENZOIN TINCTURE PRP APPL 2/3 (GAUZE/BANDAGES/DRESSINGS) ×2 IMPLANT
BLADE ULTRA TIP 2M (BLADE) ×2 IMPLANT
BNDG GAUZE ELAST 4 BULKY (GAUZE/BANDAGES/DRESSINGS) ×4 IMPLANT
BUR BARREL STRAIGHT FLUTE 4.0 (BURR) ×2 IMPLANT
BUR MATCHSTICK NEURO 3.0 LAGG (BURR) ×2 IMPLANT
CANISTER SUCT 3000ML PPV (MISCELLANEOUS) ×2 IMPLANT
CONT SPEC 4OZ CLIKSEAL STRL BL (MISCELLANEOUS) ×2 IMPLANT
COVER MAYO STAND STRL (DRAPES) ×2 IMPLANT
DRAIN JACKSON PRATT 10MM FLAT (MISCELLANEOUS) ×2 IMPLANT
DRAPE C-ARM 42X72 X-RAY (DRAPES) IMPLANT
DRAPE LAPAROTOMY 100X72 PEDS (DRAPES) ×2 IMPLANT
DRAPE MICROSCOPE LEICA (MISCELLANEOUS) ×2 IMPLANT
DRAPE POUCH INSTRU U-SHP 10X18 (DRAPES) ×2 IMPLANT
DRAPE PROXIMA HALF (DRAPES) IMPLANT
DRSG OPSITE POSTOP 3X4 (GAUZE/BANDAGES/DRESSINGS) ×2 IMPLANT
DURAPREP 6ML APPLICATOR 50/CS (WOUND CARE) ×2 IMPLANT
ELECT REM PT RETURN 9FT ADLT (ELECTROSURGICAL) ×2
ELECTRODE REM PT RTRN 9FT ADLT (ELECTROSURGICAL) ×1 IMPLANT
EVACUATOR SILICONE 100CC (DRAIN) ×2 IMPLANT
GAUZE SPONGE 4X4 12PLY STRL (GAUZE/BANDAGES/DRESSINGS) IMPLANT
GAUZE SPONGE 4X4 16PLY XRAY LF (GAUZE/BANDAGES/DRESSINGS) IMPLANT
GLOVE BIO SURGEON STRL SZ 6.5 (GLOVE) IMPLANT
GLOVE BIO SURGEON STRL SZ7 (GLOVE) IMPLANT
GLOVE BIO SURGEON STRL SZ7.5 (GLOVE) IMPLANT
GLOVE BIO SURGEON STRL SZ8 (GLOVE) IMPLANT
GLOVE BIO SURGEON STRL SZ8.5 (GLOVE) IMPLANT
GLOVE BIOGEL M 8.0 STRL (GLOVE) ×6 IMPLANT
GLOVE ECLIPSE 6.5 STRL STRAW (GLOVE) IMPLANT
GLOVE ECLIPSE 7.0 STRL STRAW (GLOVE) IMPLANT
GLOVE ECLIPSE 7.5 STRL STRAW (GLOVE) ×6 IMPLANT
GLOVE ECLIPSE 8.0 STRL XLNG CF (GLOVE) ×2 IMPLANT
GLOVE ECLIPSE 8.5 STRL (GLOVE) ×4 IMPLANT
GLOVE EXAM NITRILE LRG STRL (GLOVE) IMPLANT
GLOVE EXAM NITRILE MD LF STRL (GLOVE) IMPLANT
GLOVE EXAM NITRILE XL STR (GLOVE) IMPLANT
GLOVE EXAM NITRILE XS STR PU (GLOVE) IMPLANT
GLOVE INDICATOR 6.5 STRL GRN (GLOVE) IMPLANT
GLOVE INDICATOR 7.0 STRL GRN (GLOVE) IMPLANT
GLOVE INDICATOR 7.5 STRL GRN (GLOVE) IMPLANT
GLOVE INDICATOR 8.0 STRL GRN (GLOVE) ×2 IMPLANT
GLOVE INDICATOR 8.5 STRL (GLOVE) IMPLANT
GLOVE OPTIFIT SS 8.0 STRL (GLOVE) IMPLANT
GLOVE SURG SS PI 6.5 STRL IVOR (GLOVE) IMPLANT
GOWN STRL REUS W/ TWL LRG LVL3 (GOWN DISPOSABLE) ×2 IMPLANT
GOWN STRL REUS W/ TWL XL LVL3 (GOWN DISPOSABLE) ×1 IMPLANT
GOWN STRL REUS W/TWL 2XL LVL3 (GOWN DISPOSABLE) ×2 IMPLANT
GOWN STRL REUS W/TWL LRG LVL3 (GOWN DISPOSABLE) ×2
GOWN STRL REUS W/TWL XL LVL3 (GOWN DISPOSABLE) ×1
HALTER HD/CHIN CERV TRACTION D (MISCELLANEOUS) IMPLANT
HEMOSTAT POWDER KIT SURGIFOAM (HEMOSTASIS) ×2 IMPLANT
KIT BASIN OR (CUSTOM PROCEDURE TRAY) ×2 IMPLANT
KIT ROOM TURNOVER OR (KITS) ×2 IMPLANT
NEEDLE SPNL 22GX3.5 QUINCKE BK (NEEDLE) ×2 IMPLANT
NS IRRIG 1000ML POUR BTL (IV SOLUTION) ×2 IMPLANT
PACK LAMINECTOMY NEURO (CUSTOM PROCEDURE TRAY) ×2 IMPLANT
PATTIES SURGICAL .5 X1 (DISPOSABLE) ×2 IMPLANT
PLATE ANT CERV XTEND 3 LV 45 (Plate) ×2 IMPLANT
RUBBERBAND STERILE (MISCELLANEOUS) ×4 IMPLANT
SCREW XTD VAR 4.2 SELF TAP 12 (Screw) ×16 IMPLANT
SPACER CERVICAL FRGE 12X14X6-7 (Spacer) ×2 IMPLANT
SPACER CERVICAL FRGE 12X14X7-7 (Spacer) ×4 IMPLANT
SPONGE INTESTINAL PEANUT (DISPOSABLE) ×2 IMPLANT
SPONGE SURGIFOAM ABS GEL 100 (HEMOSTASIS) ×2 IMPLANT
STRIP CLOSURE SKIN 1/2X4 (GAUZE/BANDAGES/DRESSINGS) ×2 IMPLANT
SUT VIC AB 3-0 SH 8-18 (SUTURE) ×4 IMPLANT
SYR 20ML ECCENTRIC (SYRINGE) ×2 IMPLANT
TOWEL OR 17X24 6PK STRL BLUE (TOWEL DISPOSABLE) ×2 IMPLANT
TOWEL OR 17X26 10 PK STRL BLUE (TOWEL DISPOSABLE) ×2 IMPLANT
WATER STERILE IRR 1000ML POUR (IV SOLUTION) ×2 IMPLANT

## 2014-12-22 NOTE — Progress Notes (Signed)
Patient arrived from PACU to 4N24 at 1630. Status post C4-5 C5-6, C6-7 anterior cervical decompression/diskectomy/fusion. Vital signs taken and charted. Oriented to room with bed alarm on. Family at bedside.

## 2014-12-22 NOTE — Op Note (Signed)
NAMEOLIVIAROSE, PUNCH NO.:  [id number]  MEDICAL RECORD NO.:  [id number]  LOCATION:  4N24C                        FACILITY:  [facility]  PHYSICIAN:  Leeroy Cha, M.D.   DATE OF BIRTH:  08-Mar-1941  DATE OF PROCEDURE:  12/22/2014 DATE OF DISCHARGE:                              OPERATIVE REPORT   PREOPERATIVE DIAGNOSES:  Cervical stenosis with acute and chronic radiculopathy, right side worse than the left one.  Cervical spondylosis with stenosis, 4-5, 5-6 and 6-7.  POSTOPERATIVE DIAGNOSES:  Cervical stenosis with acute and chronic radiculopathy, right side worse than the left one.  Cervical spondylosis with stenosis, 4-5, 5-6 and 6-7.  PROCEDURES:  Anterior 4-5, 5-6, and 6-7 diskectomy, decompression of the spinal cord, bilateral foraminotomy, interbody fusion with allograft plate from C4 to C7.  Microscope.  SURGEON:  Leeroy Cha, M.D.  ASSISTANT:  Faythe Ghee, M.D.  CLINICAL HISTORY:  The patient was seen in my office complaining of neck pain with radiation to both upper extremities.  Clinically, she had weakness of the biceps and wrist extensors.  X-ray shows spondylosis from C4 to C7, being worse at the level of 5-6.  The patient want to proceed with surgery and she knew the risks and benefits.  DESCRIPTION OF PROCEDURE:  The patient was taken to the OR, and after intubation, the left side of the neck was cleaned with DuraPrep and drapes were applied.  Transverse incision was made through the skin and subcutaneous tissue straight to the cervical spine.  The patient had quite a bit of osteophyte and the first x-ray showed that indeed we were right at the level of 5-6.  Then, we proceeded with opening the anterior ligament of 4-5, which was calcified and with the help of the microscope, we did a diskectomy with decompression of the spinal cord centrally and the foramen bilaterally.  The same procedure was done at the level of 5-6 and 6-7, being  worse at the level of 5-6.  The patient had large osteophyte bilaterally, the right worse than the left one.  At the end, we had good decompression of the spinal cord as well as decompression of the foramen for the C6 and C7 nerve roots.  From then on, the area was irrigated.  We introduced an allograft of 6-mm lordotic at the level of 4, 5 and 7 between 5-6 and 6-7.  This was followed with a plate using 8 screws.  Lateral cervical spine showed good position of the plate and the allograft.  The area was irrigated and although, we achieved good hemostasis, I left a drain because she has been taking anti- inflammatory.  The wound was closed with Vicryl and Steri-Strips.  She woke up and moving all the four extremities.          ______________________________ Leeroy Cha, M.D.     EB/MEDQ  D:  12/22/2014  T:  12/22/2014  Job:  [id number]

## 2014-12-22 NOTE — Progress Notes (Signed)
ANTIBIOTIC CONSULT NOTE - INITIAL  Pharmacy Consult for vancomycin  Indication: surgical prophylaxis  Allergies  Allergen Reactions  . Ampicillin Other (See Comments)    REACTION: causes urticaria  . Neomycin-Bacitracin Zn-Polymyx Rash  . Penicillins Other (See Comments)    REACTION: causes urticaria    Patient Measurements: Height: 5\' 2"  (157.5 cm) Weight: 141 lb (63.957 kg) IBW/kg (Calculated) : 50.1 Adjusted Body Weight:   Vital Signs: Temp: 98.2 F (36.8 C) (08/02 1633) Temp Source: Oral (08/02 1633) BP: 153/58 mmHg (08/02 1633) Pulse Rate: 91 (08/02 1633) Intake/Output from previous day:   Intake/Output from this shift: Total I/O In: 2000 [I.V.:2000] Out: 400 [Urine:300; Blood:100]  Labs: No results for input(s): WBC, HGB, PLT, LABCREA, CREATININE in the last 72 hours. Estimated Creatinine Clearance: 50.9 mL/min (by C-G formula based on Cr of 0.84). No results for input(s): VANCOTROUGH, VANCOPEAK, VANCORANDOM, GENTTROUGH, GENTPEAK, GENTRANDOM, TOBRATROUGH, TOBRAPEAK, TOBRARND, AMIKACINPEAK, AMIKACINTROU, AMIKACIN in the last 72 hours.   Medical History: Past Medical History  Diagnosis Date  . Type II or unspecified type diabetes mellitus without mention of complication, uncontrolled   . Routine general medical examination at a health care facility   . Occlusion and stenosis of carotid artery without mention of cerebral infarction   . Unspecified venous (peripheral) insufficiency   . Displacement of lumbar intervertebral disc without myelopathy   . Sciatica   . Pain in limb   . Pain in joint, shoulder region   . Contact dermatitis and other eczema, due to unspecified cause   . Gout, unspecified   . Other and unspecified hyperlipidemia   . Unspecified essential hypertension   . FH: cataracts     Medications:  Anti-infectives    Start     Dose/Rate Route Frequency Ordered Stop   12/23/14 1100  vancomycin (VANCOCIN) 1,250 mg in sodium chloride 0.9 % 250 mL  IVPB     1,250 mg 166.7 mL/hr over 90 Minutes Intravenous Every 24 hours 12/22/14 1655     12/22/14 1645  ceFAZolin (ANCEF) IVPB 1 g/50 mL premix     1 g 100 mL/hr over 30 Minutes Intravenous Every 8 hours 12/22/14 1636 12/23/14 0844   12/22/14 1500  vancomycin (VANCOCIN) IVPB 1000 mg/200 mL premix  Status:  Discontinued     1,000 mg 200 mL/hr over 60 Minutes Intravenous  Once 12/22/14 1456 12/22/14 1457   12/22/14 1130  vancomycin (VANCOCIN) IVPB 1000 mg/200 mL premix     1,000 mg 200 mL/hr over 60 Minutes Intravenous To Neuro OR-Station #32 12/21/14 1312 12/22/14 1215     Assessment: 74 yo female POD #0 of cervical fusion from C4-7. Pt has drain in place, will therefore continue vancomycin until discontinued by MD. AFebrile, WBC 7.9.   Goal of Therapy:  Vancomycin trough level 10-15 mcg/ml  Plan:  Vancomycin 1250 mg IV q24h Monitor renal fx, VT as needed   Hughes Better, PharmD, BCPS Clinical Pharmacist Pager: [phone] 12/22/2014 4:56 PM

## 2014-12-22 NOTE — Progress Notes (Signed)
Patient expressed that she is feeling tingling in right hand. She stated that sensation was not present before surgery. She is able to move arm and hand without difficulty. She able to feel palpations to hand.

## 2014-12-22 NOTE — Anesthesia Postprocedure Evaluation (Signed)
  Anesthesia Post-op Note  Patient: [name]  Procedure(s) Performed: Procedure(s) with comments: Anterior Cervical Four-Five/Five-Six/Six-Seven Decompression/Diskectomy/ and Fsion (N/A) - C4-5 C5-6 C6-7 Anterior cervical decompression/diskectomy/fusion  Patient Location: PACU  Anesthesia Type:General  Level of Consciousness: awake  Airway and Oxygen Therapy: Patient Spontanous Breathing  Post-op Pain: mild  Post-op Assessment: Post-op Vital signs reviewed LLE Motor Response: Purposeful movement, Responds to commands LLE Sensation: No numbness RLE Motor Response: Purposeful movement, Responds to commands RLE Sensation: No numbness      Post-op Vital Signs: Reviewed and stable  Last Vitals:  Filed Vitals:   12/22/14 1505  BP:   Pulse: 68  Temp:   Resp: 15    Complications: No apparent anesthesia complications

## 2014-12-22 NOTE — Anesthesia Procedure Notes (Signed)
Procedure Name: Intubation Date/Time: 12/22/2014 11:56 AM Performed by: Izora Gala Pre-anesthesia Checklist: Patient identified, Emergency Drugs available, Suction available and Patient being monitored Patient Re-evaluated:Patient Re-evaluated prior to inductionOxygen Delivery Method: Circle system utilized Preoxygenation: Pre-oxygenation with 100% oxygen Intubation Type: IV induction Ventilation: Mask ventilation without difficulty Laryngoscope Size: Miller and 3 Grade View: Grade II Tube type: Oral Tube size: 7.5 mm Number of attempts: 1 Airway Equipment and Method: Stylet,  LTA kit utilized and Bite block Placement Confirmation: ETT inserted through vocal cords under direct vision,  positive ETCO2 and breath sounds checked- equal and bilateral Secured at: 22 cm Tube secured with: Tape Dental Injury: Teeth and Oropharynx as per pre-operative assessment

## 2014-12-22 NOTE — Transfer of Care (Signed)
Immediate Anesthesia Transfer of Care Note  Patient: [name]  Procedure(s) Performed: Procedure(s) with comments: Anterior Cervical Four-Five/Five-Six/Six-Seven Decompression/Diskectomy/ and Fsion (N/A) - C4-5 C5-6 C6-7 Anterior cervical decompression/diskectomy/fusion  Patient Location: PACU  Anesthesia Type:General  Level of Consciousness: awake, alert  and patient cooperative  Airway & Oxygen Therapy: Patient Spontanous Breathing and Patient connected to face mask oxygen  Post-op Assessment: Report given to RN, Post -op Vital signs reviewed and stable, Patient moving all extremities and Patient moving all extremities X 4  Post vital signs: Reviewed and stable  Last Vitals:  Filed Vitals:   12/22/14 1438  BP:   Pulse:   Temp: 36.7 C  Resp:     Complications: No apparent anesthesia complications

## 2014-12-22 NOTE — Progress Notes (Signed)
Orthopedic Tech Progress Note Patient Details:  [name] 04/02/1941 [id number]  Ortho Devices Type of Ortho Device: Soft collar Ortho Device/Splint Location: neck Ortho Device/Splint Interventions: Ordered, Application   Braulio Bosch 12/22/2014, 3:15 PM

## 2014-12-23 ENCOUNTER — Encounter (HOSPITAL_COMMUNITY): Payer: Self-pay | Admitting: Neurosurgery

## 2014-12-23 MED ORDER — GABAPENTIN 600 MG PO TABS
300.0000 mg | ORAL_TABLET | Freq: Two times a day (BID) | ORAL | Status: DC
  Administered 2014-12-23 – 2014-12-24 (×3): 300 mg via ORAL
  Filled 2014-12-23 (×3): qty 1

## 2014-12-23 NOTE — Evaluation (Signed)
Physical Therapy Evaluation Patient Details Name: [name] MRN: [id number] DOB: [birth year] Today's Date: 12/23/2014   History of Present Illness  Pt s/p Anterior 4-5, 5-6, and 6-7 diskectomy, decompression of the 8/2.  Clinical Impression  Patient is s/p above surgery resulting in the deficits listed below (see PT Problem List). Pt with new onset of R UE weakness, and R UE remains numb. Pt otherwise mobilizing well.  Patient will benefit from skilled PT to increase their independence and safety with mobility (while adhering to their precautions) to allow discharge to the venue listed below.     Follow Up Recommendations Outpatient PT;Supervision/Assistance - 24 hour (for R UE) (or outpt OT, either would be fine to address R UE)    Equipment Recommendations  None recommended by PT    Recommendations for Other Services OT consult     Precautions / Restrictions Precautions Precautions: Cervical;Fall Precaution Comments: handout provided Required Braces or Orthoses: Cervical Brace Cervical Brace: Soft collar;At all times Restrictions Weight Bearing Restrictions: No      Mobility  Bed Mobility Overal bed mobility: Needs Assistance Bed Mobility: Rolling;Sidelying to Sit Rolling: Min guard Sidelying to sit: Min guard       General bed mobility comments: v/c's for technique, used bed rail  Transfers Overall transfer level: Needs assistance Equipment used: Rolling walker (2 wheeled) Transfers: Sit to/from Stand Sit to Stand: Min guard         General transfer comment: increased time, v/c's for safe hand placement, guarded/cautious  Ambulation/Gait Ambulation/Gait assistance: Min guard;Min assist Ambulation Distance (Feet): 200 Feet Assistive device: Rolling walker (2 wheeled);None Gait Pattern/deviations: Step-through pattern;Decreased stride length Gait velocity: decresaed Gait velocity interpretation: Below normal speed for age/gender General Gait Details:  attempted amb without AD pt guarded with decreased step length and reaching for hallway rail. Pt with improved gait fluidity, increased step length. pt reports feeling more steady with RW  Stairs            Wheelchair Mobility    Modified Rankin (Stroke Patients Only)       Balance Overall balance assessment: No apparent balance deficits (not formally assessed) (needs rw for amb, able to stand at sink and wash hands )                                           Pertinent Vitals/Pain Pain Assessment: No/denies pain    Home Living Family/patient expects to be discharged to:: Private residence Living Arrangements: Spouse/significant other Available Help at Discharge: Family;Available 24 hours/day Type of Home: House Home Access: Stairs to enter Entrance Stairs-Rails: None Entrance Stairs-Number of Steps: 3 Home Layout: One level Home Equipment: Environmental consultant - 2 wheels      Prior Function Level of Independence: Independent               Hand Dominance   Dominant Hand: Right    Extremity/Trunk Assessment   Upper Extremity Assessment: RUE deficits/detail RUE Deficits / Details: grossly 3-/5   RUE Sensation: decreased light touch     Lower Extremity Assessment: Generalized weakness      Cervical / Trunk Assessment:  (cervical surgery)  Communication   Communication: No difficulties  Cognition Arousal/Alertness: Awake/alert Behavior During Therapy: WFL for tasks assessed/performed Overall Cognitive Status: Within Functional Limits for tasks assessed  General Comments General comments (skin integrity, edema, etc.): pt with drain at surgical site    Exercises        Assessment/Plan    PT Assessment Patient needs continued PT services  PT Diagnosis Difficulty walking;Generalized weakness   PT Problem List Decreased strength;Decreased activity tolerance;Decreased balance;Decreased mobility  PT Treatment  Interventions Gait training;Stair training;Functional mobility training;Therapeutic activities;Therapeutic exercise;DME instruction;Balance training   PT Goals (Current goals can be found in the Care Plan section) Acute Rehab PT Goals Patient Stated Goal: R hand to get better PT Goal Formulation: With patient Time For Goal Achievement: 12/30/14 Potential to Achieve Goals: Good    Frequency Min 5X/week   Barriers to discharge        Co-evaluation               End of Session Equipment Utilized During Treatment: Gait belt;Cervical collar Activity Tolerance: Patient tolerated treatment well Patient left: in chair;with call bell/phone within reach;with family/visitor present Nurse Communication: Mobility status         Time: 0911-0936 PT Time Calculation (min) (ACUTE ONLY): 25 min   Charges:   PT Evaluation $Initial PT Evaluation Tier I: 1 Procedure PT Treatments $Gait Training: 8-22 mins   PT G CodesKingsley Callander 12/23/2014, 10:36 AM  Kittie Plater, PT, DPT Pager #: [phone] Office #: [phone]

## 2014-12-23 NOTE — Care Management Note (Signed)
Case Management Note  Patient Details  Name: [name] MRN: [id number] Date of Birth: [birth year]  Subjective/Objective:                    Action/Plan: Patient was admitted for an ACDF. Lives at home with spouse. Will follow for discharge needs.  Expected Discharge Date:                  Expected Discharge Plan:  Home/Self Care (Lives at home with spouse)  In-House Referral:     Discharge planning Services     Post Acute Care Choice:    Choice offered to:     DME Arranged:    DME Agency:     HH Arranged:    [place] Agency:     Status of Service:  Completed, signed off  Medicare Important Message Given:    Date Medicare IM Given:    Medicare IM give by:    Date Additional Medicare IM Given:    Additional Medicare Important Message give by:     If discussed at [place] of Stay Meetings, dates discussed:    Additional Comments:  Rolm Baptise, RN 12/23/2014, 2:09 PM

## 2014-12-23 NOTE — Progress Notes (Signed)
Patient ID: [name], female   DOB: [birth year], 74 y.o.   MRN: [id number] C/o pain between shoulders. Tingling right arm . Some weakness of deltoids ,biceps. Wound dry. Minimal dranage. Spoke wit husband as well as pt. Dc in am

## 2014-12-23 NOTE — Evaluation (Signed)
Occupational Therapy Evaluation Patient Details Name: [name] MRN: [id number] DOB: 1941/02/20 Today's Date: 12/23/2014    History of Present Illness Pt s/p Anterior 4-5, 5-6, and 6-7 diskectomy, decompression of the 8/2.   Clinical Impression   Pt currently min guard for mobility without use of assistive device.  She does exhibit decreased functional use and strength of the RUE at this time impacting ADL independence.  Feel she will benefit from acute care OT to help increase functional use of the RUE as well as increasing independence with selfcare tasks.  Also feel she will need outpatient OT to continue working on these deficits at discharge.     Follow Up Recommendations  Outpatient OT    Equipment Recommendations  None recommended by OT       Precautions / Restrictions Precautions Precautions: Cervical;Fall Required Braces or Orthoses: Cervical Brace Cervical Brace: Soft collar;At all times Restrictions Weight Bearing Restrictions: No      Mobility Bed Mobility Overal bed mobility: Needs Assistance Bed Mobility: Rolling;Sidelying to Sit;Sit to Supine Rolling: Supervision Sidelying to sit: Supervision   Sit to supine: Supervision      Transfers Overall transfer level: Needs assistance Equipment used: 1 person hand held assist Transfers: Sit to/from Stand Sit to Stand: Supervision;From elevated surface              Balance Overall balance assessment: Needs assistance Sitting-balance support: Feet supported Sitting balance-Leahy Scale: Good     Standing balance support: No upper extremity supported;During functional activity Standing balance-Leahy Scale: Good Standing balance comment: Pt with increased swaying to the right side in standing at times requring stepping strategy to correct.                            ADL Overall ADL's : Needs assistance/impaired Eating/Feeding: Supervision/ safety;Sitting   Grooming: Wash/dry  hands;Wash/dry face;Sitting;Supervision/safety   Upper Body Bathing: Supervision/ safety;Sitting   Lower Body Bathing: Sit to/from stand;Min guard   Upper Body Dressing : Minimal assistance;Sitting   Lower Body Dressing: Minimal assistance;Sit to/from stand   Toilet Transfer: Min guard;Comfort height toilet;Ambulation   Toileting- Clothing Manipulation and Hygiene: Min guard;Sit to/from stand       Functional mobility during ADLs: Min guard General ADL Comments: Pt reporting that Dr. Kristeen Mans instructed her that she did not need to wear the collar at all times and to work on moving her neck so it would not become stiff.  This therapist told her that per orders she was supposed to follow her cervical precautions and to wear the soft collar at all times.  Pt with slight decreased dynamic standing balance for selfcare tasks requiring min guard for safety.  Pt educated on cervical precautions for safety as well as being educated on RUE AROM exercises for the shoulder and theraputty exercises for the right hand.       Vision Vision Assessment?: No apparent visual deficits   Perception Perception Perception Tested?: No   Praxis Praxis Praxis tested?: Not tested    Pertinent Vitals/Pain Pain Assessment: 0-10 Pain Score: 6  Pain Location: headache Pain Intervention(s): Limited activity within patient's tolerance;Patient requesting pain meds-RN notified;RN gave pain meds during session     Hand Dominance Right   Extremity/Trunk Assessment Upper Extremity Assessment Upper Extremity Assessment: RUE deficits/detail RUE Deficits / Details: AROM shoulder flexion 0-80 degrees, increased dysmetria and coordination noted with reaching tasks.  Pt also with grip strength only 3+/5 as well as  3/5 wrist extension.   RUE Sensation: decreased light touch RUE Coordination: decreased fine motor;decreased gross motor       Cervical / Trunk Assessment Cervical / Trunk Assessment: Normal    Communication Communication Communication: No difficulties   Cognition Arousal/Alertness: Awake/alert Behavior During Therapy: WFL for tasks assessed/performed Overall Cognitive Status: Within Functional Limits for tasks assessed                                Home Living Family/patient expects to be discharged to:: Private residence Living Arrangements: Spouse/significant other Available Help at Discharge: Family;Available 24 hours/day Type of Home: House Home Access: Stairs to enter [employer] of Steps: 3 Entrance Stairs-Rails: None Home Layout: One level     Bathroom Shower/Tub: Occupational psychologist: Standard     Home Equipment: Environmental consultant - 2 wheels          Prior Functioning/Environment Level of Independence: Independent             OT Diagnosis: Generalized weakness;Acute pain   OT Problem List: Decreased strength;Decreased range of motion;Impaired balance (sitting and/or standing);Pain;Impaired UE functional use   OT Treatment/Interventions: Self-care/ADL training;Balance training;Patient/family education;Therapeutic exercise;Therapeutic activities;DME and/or AE instruction    OT Goals(Current goals can be found in the care plan section) Acute Rehab OT Goals Patient Stated Goal: Pt wants to go home soon. OT Goal Formulation: With patient Time For Goal Achievement: 12/30/14 Potential to Achieve Goals: Good  OT Frequency: Min 2X/week   Barriers to D/C:               End of Session Equipment Utilized During Treatment: Gait belt;Cervical collar Nurse Communication: Mobility status  Activity Tolerance: Patient tolerated treatment well Patient left: in bed;with call bell/phone within reach   Time: 1243-1345 OT Time Calculation (min): 62 min Charges:  OT General Charges $OT Visit: 1 Procedure OT Evaluation $Initial OT Evaluation Tier I: 1 Procedure OT Treatments $Self Care/Home Management : 8-22 mins $Therapeutic  Activity: 8-22 mins $Therapeutic Exercise: 8-22 mins  Breyson Kelm OTR/L 12/23/2014, 2:01 PM

## 2014-12-24 LAB — BASIC METABOLIC PANEL
Anion gap: 7 (ref 5–15)
BUN: 22 mg/dL — ABNORMAL HIGH (ref 6–20)
CALCIUM: 9.1 mg/dL (ref 8.9–10.3)
CO2: 28 mmol/L (ref 22–32)
Chloride: 103 mmol/L (ref 101–111)
Creatinine, Ser: 0.93 mg/dL (ref 0.44–1.00)
GFR, EST NON AFRICAN AMERICAN: 59 mL/min — AB (ref >60)
GLUCOSE: 158 mg/dL — AB (ref 65–99)
POTASSIUM: 3.5 mmol/L (ref 3.5–5.1)
Sodium: 138 mmol/L (ref 135–145)

## 2014-12-24 NOTE — Discharge Summary (Signed)
Physician Discharge Summary  Patient ID: [name] MRN: [id number] DOB/AGE: 1941/04/09 74 y.o.  Admit date: 12/22/2014 Discharge date: 12/24/2014  Admission Diagnoses:cervical stenosis  Discharge Diagnoses:  Active Problems:   Cervical stenosis of spinal canal   Discharged Condition: no pain  Hospital Course: surgery  Consults: none  Significant Diagnostic Studies:mri  Treatments:acdf c4 to 7 Discharge Exam: Blood pressure 158/67, pulse 80, temperature 99.8 F (37.7 C), temperature source Oral, resp. rate 18, height 5\' 2"  (1.575 m), weight 63.957 kg (141 lb), SpO2 99 %. Mild weakness right arm  Disposition: home  , to start pt next week     Medication List    ASK your doctor about these medications        metFORMIN 500 MG tablet  Commonly known as:  GLUCOPHAGE  Take 1 tablet (500 mg total) by mouth 2 (two) times daily.     naproxen sodium 220 MG tablet  Commonly known as:  ANAPROX  Take 220 mg by mouth as needed (for pain).     propranolol-hydrochlorothiazide 40-25 MG per tablet  Commonly known as:  INDERIDE  Take 1 tablet by mouth daily.     simvastatin 20 MG tablet  Commonly known as:  ZOCOR  Take 1 tablet (20 mg total) by mouth daily.         Signed: Floyce Stakes 12/24/2014, 10:25 AM

## 2014-12-24 NOTE — Progress Notes (Signed)
Physical Therapy Treatment Patient Details Name: [name] MRN: [id number] DOB: 11-07-1940 Today's Date: 12/24/2014    History of Present Illness Pt s/p Anterior 4-5, 5-6, and 6-7 diskectomy, decompression of the 8/2.    PT Comments    Pt is progressing towards PT goals. Pt completed stair training with the use of HHA by therapist. Pt demo unsteadiness completing task however there was no LOB. Pt family member was present in room after end of therapy session. Continue to recommend DC destination to maximize pt functional mobility.   Follow Up Recommendations  Outpatient PT;Supervision/Assistance - 24 hour     Equipment Recommendations  None recommended by PT    Recommendations for Other Services OT consult     Precautions / Restrictions Precautions Precautions: Cervical;Fall Cervical Brace: Soft collar;At all times Restrictions Weight Bearing Restrictions: No    Mobility  Bed Mobility Overal bed mobility: Needs Assistance Bed Mobility: Rolling;Sidelying to Sit Rolling: Supervision Sidelying to sit: Supervision       General bed mobility comments: vc giving to use bed railing and to push up from bed to sit  Transfers Overall transfer level: Needs assistance Equipment used: Rolling walker (2 wheeled) Transfers: Sit to/from Stand Sit to Stand: Supervision         General transfer comment: vc for hand placement on RW  Ambulation/Gait Ambulation/Gait assistance: Min guard Ambulation Distance (Feet): 250 Feet (X2) Assistive device: Rolling walker (2 wheeled) Gait Pattern/deviations: Step-through pattern;Decreased stride length;Narrow base of support Gait velocity: decresaed Gait velocity interpretation: Below normal speed for age/gender General Gait Details: VC to stay close to RW, proper hand placement on RW an encouraged to slow down and take rest breaks as needed.   Stairs Stairs: Yes Stairs assistance: Min assist Stair Management: No  rails;Forwards Number of Stairs: 3 General stair comments: pt completed stair training with using HHA from therapist. pt demo unsteadiness ascending and descending stairs.   Wheelchair Mobility    Modified Rankin (Stroke Patients Only)       Balance Overall balance assessment: Needs assistance Sitting-balance support: No upper extremity supported;Feet supported Sitting balance-Leahy Scale: Good     Standing balance support: No upper extremity supported;During functional activity Standing balance-Leahy Scale: Good Standing balance comment: pt is able to stand without the use of an AD  but when dynamic balance task are introduced patient becomes unsteady.                    Cognition Arousal/Alertness: Awake/alert Behavior During Therapy: WFL for tasks assessed/performed Overall Cognitive Status: Within Functional Limits for tasks assessed                      Exercises      General Comments General comments (skin integrity, edema, etc.): pt family member was in room at end if therapy session.      Pertinent Vitals/Pain Pain Assessment: No/denies pain    Home Living                      Prior Function            PT Goals (current goals can now be found in the care plan section) Acute Rehab PT Goals Patient Stated Goal: Pt wants to go home soon. Progress towards PT goals: Progressing toward goals    Frequency  Min 5X/week    PT Plan Current plan remains appropriate    Co-evaluation  End of Session Equipment Utilized During Treatment: Cervical collar Activity Tolerance: Patient tolerated treatment well Patient left: in chair;with call bell/phone within reach;with family/visitor present     Time: 2595-6387 PT Time Calculation (min) (ACUTE ONLY): 12 min  Charges:  $Gait Training: 8-22 mins                    G Codes:      Renaee Munda Jan 10, 2015, 10:55 AM   Renaee Munda, SPTA (student physical therapy  assistant) Acute Rehab [phone]

## 2014-12-24 NOTE — Progress Notes (Signed)
..  Discussed discharge intructions with the patient and all questioned fully answered. Patient denies pain at this time, has pain medication script. Patient transported home by family.

## 2014-12-25 ENCOUNTER — Telehealth: Payer: Self-pay | Admitting: *Deleted

## 2014-12-25 NOTE — Telephone Encounter (Signed)
Pt was on tcm list d/c 12/24/14 had Cervical stenosis of spinal canal. Will start PT next week. Did not make tcm appt with pcp...Chryl Heck

## 2014-12-29 DIAGNOSIS — M62411 Contracture of muscle, right shoulder: Secondary | ICD-10-CM | POA: Diagnosis not present

## 2014-12-29 DIAGNOSIS — M79621 Pain in right upper arm: Secondary | ICD-10-CM | POA: Diagnosis not present

## 2014-12-29 DIAGNOSIS — M25611 Stiffness of right shoulder, not elsewhere classified: Secondary | ICD-10-CM | POA: Diagnosis not present

## 2014-12-29 DIAGNOSIS — M25511 Pain in right shoulder: Secondary | ICD-10-CM | POA: Diagnosis not present

## 2014-12-31 DIAGNOSIS — M25611 Stiffness of right shoulder, not elsewhere classified: Secondary | ICD-10-CM | POA: Diagnosis not present

## 2014-12-31 DIAGNOSIS — M79621 Pain in right upper arm: Secondary | ICD-10-CM | POA: Diagnosis not present

## 2014-12-31 DIAGNOSIS — M62411 Contracture of muscle, right shoulder: Secondary | ICD-10-CM | POA: Diagnosis not present

## 2014-12-31 DIAGNOSIS — M25511 Pain in right shoulder: Secondary | ICD-10-CM | POA: Diagnosis not present

## 2015-01-05 DIAGNOSIS — M25611 Stiffness of right shoulder, not elsewhere classified: Secondary | ICD-10-CM | POA: Diagnosis not present

## 2015-01-05 DIAGNOSIS — M62411 Contracture of muscle, right shoulder: Secondary | ICD-10-CM | POA: Diagnosis not present

## 2015-01-05 DIAGNOSIS — M79621 Pain in right upper arm: Secondary | ICD-10-CM | POA: Diagnosis not present

## 2015-01-05 DIAGNOSIS — M25511 Pain in right shoulder: Secondary | ICD-10-CM | POA: Diagnosis not present

## 2015-01-07 DIAGNOSIS — M79621 Pain in right upper arm: Secondary | ICD-10-CM | POA: Diagnosis not present

## 2015-01-07 DIAGNOSIS — M25511 Pain in right shoulder: Secondary | ICD-10-CM | POA: Diagnosis not present

## 2015-01-07 DIAGNOSIS — M25611 Stiffness of right shoulder, not elsewhere classified: Secondary | ICD-10-CM | POA: Diagnosis not present

## 2015-01-07 DIAGNOSIS — M62411 Contracture of muscle, right shoulder: Secondary | ICD-10-CM | POA: Diagnosis not present

## 2015-01-12 DIAGNOSIS — M25611 Stiffness of right shoulder, not elsewhere classified: Secondary | ICD-10-CM | POA: Diagnosis not present

## 2015-01-12 DIAGNOSIS — M79621 Pain in right upper arm: Secondary | ICD-10-CM | POA: Diagnosis not present

## 2015-01-12 DIAGNOSIS — M62411 Contracture of muscle, right shoulder: Secondary | ICD-10-CM | POA: Diagnosis not present

## 2015-01-12 DIAGNOSIS — M25511 Pain in right shoulder: Secondary | ICD-10-CM | POA: Diagnosis not present

## 2015-01-14 DIAGNOSIS — M62411 Contracture of muscle, right shoulder: Secondary | ICD-10-CM | POA: Diagnosis not present

## 2015-01-14 DIAGNOSIS — M4802 Spinal stenosis, cervical region: Secondary | ICD-10-CM | POA: Diagnosis not present

## 2015-01-14 DIAGNOSIS — M79621 Pain in right upper arm: Secondary | ICD-10-CM | POA: Diagnosis not present

## 2015-01-14 DIAGNOSIS — M25511 Pain in right shoulder: Secondary | ICD-10-CM | POA: Diagnosis not present

## 2015-01-14 DIAGNOSIS — M25611 Stiffness of right shoulder, not elsewhere classified: Secondary | ICD-10-CM | POA: Diagnosis not present

## 2015-01-14 DIAGNOSIS — Z6826 Body mass index (BMI) 26.0-26.9, adult: Secondary | ICD-10-CM | POA: Diagnosis not present

## 2015-01-19 ENCOUNTER — Encounter: Payer: Self-pay | Admitting: Internal Medicine

## 2015-01-19 ENCOUNTER — Ambulatory Visit (INDEPENDENT_AMBULATORY_CARE_PROVIDER_SITE_OTHER): Payer: Medicare Other | Admitting: Internal Medicine

## 2015-01-19 VITALS — BP 148/86 | HR 66 | Temp 98.2°F | Resp 16 | Ht 62.0 in | Wt 134.0 lb

## 2015-01-19 DIAGNOSIS — I6523 Occlusion and stenosis of bilateral carotid arteries: Secondary | ICD-10-CM | POA: Diagnosis not present

## 2015-01-19 DIAGNOSIS — M62411 Contracture of muscle, right shoulder: Secondary | ICD-10-CM | POA: Diagnosis not present

## 2015-01-19 DIAGNOSIS — E1149 Type 2 diabetes mellitus with other diabetic neurological complication: Secondary | ICD-10-CM

## 2015-01-19 DIAGNOSIS — I10 Essential (primary) hypertension: Secondary | ICD-10-CM | POA: Diagnosis not present

## 2015-01-19 DIAGNOSIS — M25611 Stiffness of right shoulder, not elsewhere classified: Secondary | ICD-10-CM | POA: Diagnosis not present

## 2015-01-19 DIAGNOSIS — M25511 Pain in right shoulder: Secondary | ICD-10-CM | POA: Diagnosis not present

## 2015-01-19 DIAGNOSIS — M79621 Pain in right upper arm: Secondary | ICD-10-CM | POA: Diagnosis not present

## 2015-01-19 MED ORDER — VALSARTAN 160 MG PO TABS: 160.0000 mg | ORAL_TABLET | Freq: Every day | ORAL | Status: DC

## 2015-01-19 NOTE — Patient Instructions (Signed)

## 2015-01-19 NOTE — Progress Notes (Signed)
Pre visit review using our clinic review tool, if applicable. No additional management support is needed unless otherwise documented below in the visit note. 

## 2015-01-19 NOTE — Progress Notes (Signed)
Subjective:  Patient ID: [name], female    DOB: 1940-08-19  Age: 74 y.o. MRN: [id number]  CC: Hypertension; Hyperlipidemia; and Diabetes   HPI MIRIELLE BYRUM presents for a follow-up on hypertension diabetes. She is several weeks status post C-spine fusion and is recovering nicely. She offers no new or different complaints today.  Outpatient Prescriptions Prior to Visit  Medication Sig Dispense Refill  . metFORMIN (GLUCOPHAGE) 500 MG tablet Take 1 tablet (500 mg total) by mouth 2 (two) times daily. 180 tablet 3  . propranolol-hydrochlorothiazide (INDERIDE) 40-25 MG per tablet Take 1 tablet by mouth daily. 30 tablet 5  . simvastatin (ZOCOR) 20 MG tablet Take 1 tablet (20 mg total) by mouth daily. 30 tablet 11  . naproxen sodium (ANAPROX) 220 MG tablet Take 220 mg by mouth as needed (for pain).     No facility-administered medications prior to visit.    ROS Review of Systems  Constitutional: Negative.  Negative for fever, chills, diaphoresis, appetite change and fatigue.  HENT: Negative.   Eyes: Negative.   Respiratory: Negative.  Negative for cough, choking, chest tightness, shortness of breath and stridor.   Cardiovascular: Negative.  Negative for chest pain, palpitations and leg swelling.  Gastrointestinal: Negative.  Negative for nausea, vomiting, abdominal pain, diarrhea, constipation and blood in stool.  Endocrine: Negative.   Genitourinary: Negative.  Negative for dysuria, urgency, frequency and difficulty urinating.  Musculoskeletal: Positive for back pain and neck pain. Negative for myalgias, joint swelling, arthralgias, gait problem and neck stiffness.  Skin: Negative.  Negative for rash.  Allergic/Immunologic: Negative.   Neurological: Negative.  Negative for dizziness.  Hematological: Negative.  Negative for adenopathy. Does not bruise/bleed easily.  Psychiatric/Behavioral: Negative.     Objective:  BP 148/86 mmHg  Pulse 66  Temp(Src) 98.2 F (36.8 C)  Resp  16  Ht 5\' 2"  (1.575 m)  Wt 134 lb (60.782 kg)  BMI 24.50 kg/m2  SpO2 97%  BP Readings from Last 3 Encounters:  01/19/15 148/86  12/24/14 158/67  12/18/14 153/53    Wt Readings from Last 3 Encounters:  01/19/15 134 lb (60.782 kg)  12/22/14 141 lb (63.957 kg)  12/18/14 141 lb 4.8 oz (64.093 kg)    Physical Exam  Constitutional: No distress.  HENT:  Head: Normocephalic and atraumatic.  Mouth/Throat: Oropharynx is clear and moist. No oropharyngeal exudate.  Eyes: Conjunctivae are normal. Right eye exhibits no discharge. Left eye exhibits no discharge. No scleral icterus.  Neck: Normal range of motion. Neck supple. No JVD present. Carotid bruit is not present. No tracheal deviation present. No thyroid mass and no thyromegaly present.    Cardiovascular: Normal rate, regular rhythm, normal heart sounds and intact distal pulses.  Exam reveals no gallop and no friction rub.   No murmur heard. Pulmonary/Chest: Effort normal and breath sounds normal. No stridor. No respiratory distress. She has no wheezes. She has no rales. She exhibits no tenderness.  Abdominal: Soft. Bowel sounds are normal. She exhibits no distension and no mass. There is no tenderness. There is no rebound and no guarding.  Musculoskeletal: Normal range of motion. She exhibits no edema or tenderness.       Cervical back: Normal. She exhibits normal range of motion, no tenderness, no bony tenderness, no swelling, no edema and no deformity.  Lymphadenopathy:    She has no cervical adenopathy.  Skin: Skin is warm and dry. No rash noted. She is not diaphoretic. No erythema. No pallor.  Lab Results  Component Value Date   WBC 7.9 12/18/2014   HGB 13.2 12/18/2014   HCT 39.6 12/18/2014   PLT 369 12/18/2014   GLUCOSE 158* 12/24/2014   CHOL 131 09/16/2014   TRIG 166.0* 09/16/2014   HDL 36.60* 09/16/2014   LDLDIRECT 81.6 12/31/2009   LDLCALC 61 09/16/2014   ALT 22 09/16/2014   AST 23 09/16/2014   NA 138 12/24/2014    K 3.5 12/24/2014   CL 103 12/24/2014   CREATININE 0.93 12/24/2014   BUN 22* 12/24/2014   CO2 28 12/24/2014   TSH 1.34 01/02/2011   HGBA1C 6.7* 12/18/2014   MICROALBUR 2.3* 03/31/2014    No results found.  Assessment & Plan:   Haivyn was seen today for hypertension, hyperlipidemia and diabetes.  Diagnoses and all orders for this visit:  DM (diabetes mellitus) type II controlled, neurological manifestation- her recent A1c shows that her blood sugars are well-controlled, I have asked her start an ARB for renal protection. -     valsartan (DIOVAN) 160 MG tablet; Take 1 tablet (160 mg total) by mouth daily.  Essential hypertension- her blood pressure is not adequately well controlled, I have asked her to add an ARB to the diuretic and beta blocker. I've also asked her to try to stop taking NSAIDs. -     valsartan (DIOVAN) 160 MG tablet; Take 1 tablet (160 mg total) by mouth daily.  Carotid artery stenosis, bilateral- she has no signs or symptoms related to this, we'll start an ARB. -     valsartan (DIOVAN) 160 MG tablet; Take 1 tablet (160 mg total) by mouth daily.   I have discontinued Ms. Hand's naproxen sodium. I am also having her start on valsartan. Additionally, I am having her maintain her metFORMIN, simvastatin, propranolol-hydrochlorothiazide, HYDROcodone-acetaminophen, and traMADol.  Meds ordered this encounter  Medications  . HYDROcodone-acetaminophen (NORCO) 10-325 MG per tablet    Sig: TAKE 1 TABLET BY MOUTH EVERY 6-8 HOURS AS NEEDED FOR PAIN    Refill:  0  . traMADol (ULTRAM) 50 MG tablet    Sig: TAKE 1 TABLET BY MOUTH EVERY 6-8 HOURS    Refill:  0  . valsartan (DIOVAN) 160 MG tablet    Sig: Take 1 tablet (160 mg total) by mouth daily.    Dispense:  90 tablet    Refill:  3     Follow-up: Return in about 3 months (around 04/21/2015).  Scarlette Calico, MD

## 2015-01-21 DIAGNOSIS — M62411 Contracture of muscle, right shoulder: Secondary | ICD-10-CM | POA: Diagnosis not present

## 2015-01-21 DIAGNOSIS — M25511 Pain in right shoulder: Secondary | ICD-10-CM | POA: Diagnosis not present

## 2015-01-21 DIAGNOSIS — M25611 Stiffness of right shoulder, not elsewhere classified: Secondary | ICD-10-CM | POA: Diagnosis not present

## 2015-01-21 DIAGNOSIS — M79621 Pain in right upper arm: Secondary | ICD-10-CM | POA: Diagnosis not present

## 2015-01-26 DIAGNOSIS — M25611 Stiffness of right shoulder, not elsewhere classified: Secondary | ICD-10-CM | POA: Diagnosis not present

## 2015-01-26 DIAGNOSIS — M25511 Pain in right shoulder: Secondary | ICD-10-CM | POA: Diagnosis not present

## 2015-01-26 DIAGNOSIS — M79621 Pain in right upper arm: Secondary | ICD-10-CM | POA: Diagnosis not present

## 2015-01-26 DIAGNOSIS — M62411 Contracture of muscle, right shoulder: Secondary | ICD-10-CM | POA: Diagnosis not present

## 2015-01-28 DIAGNOSIS — M25611 Stiffness of right shoulder, not elsewhere classified: Secondary | ICD-10-CM | POA: Diagnosis not present

## 2015-01-28 DIAGNOSIS — M79621 Pain in right upper arm: Secondary | ICD-10-CM | POA: Diagnosis not present

## 2015-01-28 DIAGNOSIS — M25511 Pain in right shoulder: Secondary | ICD-10-CM | POA: Diagnosis not present

## 2015-01-28 DIAGNOSIS — M62411 Contracture of muscle, right shoulder: Secondary | ICD-10-CM | POA: Diagnosis not present

## 2015-02-18 DIAGNOSIS — M4802 Spinal stenosis, cervical region: Secondary | ICD-10-CM | POA: Diagnosis not present

## 2015-02-18 DIAGNOSIS — I10 Essential (primary) hypertension: Secondary | ICD-10-CM | POA: Diagnosis not present

## 2015-02-18 DIAGNOSIS — Z6825 Body mass index (BMI) 25.0-25.9, adult: Secondary | ICD-10-CM | POA: Diagnosis not present

## 2015-02-26 DIAGNOSIS — G5603 Carpal tunnel syndrome, bilateral upper limbs: Secondary | ICD-10-CM | POA: Diagnosis not present

## 2015-02-26 DIAGNOSIS — Z6825 Body mass index (BMI) 25.0-25.9, adult: Secondary | ICD-10-CM | POA: Diagnosis not present

## 2015-02-26 DIAGNOSIS — M5412 Radiculopathy, cervical region: Secondary | ICD-10-CM | POA: Diagnosis not present

## 2015-03-11 DIAGNOSIS — H01131 Eczematous dermatitis of right upper eyelid: Secondary | ICD-10-CM | POA: Diagnosis not present

## 2015-03-23 ENCOUNTER — Other Ambulatory Visit: Payer: Self-pay | Admitting: Internal Medicine

## 2015-03-23 DIAGNOSIS — I6523 Occlusion and stenosis of bilateral carotid arteries: Secondary | ICD-10-CM

## 2015-03-26 ENCOUNTER — Ambulatory Visit (HOSPITAL_COMMUNITY)
Admission: RE | Admit: 2015-03-26 | Discharge: 2015-03-26 | Disposition: A | Discharge status: Home / Self Care | Payer: Medicare Other | Source: Ambulatory Visit | Attending: Cardiovascular Disease | Admitting: Cardiovascular Disease

## 2015-03-26 DIAGNOSIS — E785 Hyperlipidemia, unspecified: Secondary | ICD-10-CM | POA: Diagnosis not present

## 2015-03-26 DIAGNOSIS — I10 Essential (primary) hypertension: Secondary | ICD-10-CM | POA: Insufficient documentation

## 2015-03-26 DIAGNOSIS — Z87891 Personal history of nicotine dependence: Secondary | ICD-10-CM | POA: Diagnosis not present

## 2015-03-26 DIAGNOSIS — R938 Abnormal findings on diagnostic imaging of other specified body structures: Secondary | ICD-10-CM | POA: Insufficient documentation

## 2015-03-26 DIAGNOSIS — E119 Type 2 diabetes mellitus without complications: Secondary | ICD-10-CM | POA: Diagnosis not present

## 2015-03-26 DIAGNOSIS — I6523 Occlusion and stenosis of bilateral carotid arteries: Secondary | ICD-10-CM | POA: Diagnosis not present

## 2015-04-21 ENCOUNTER — Encounter: Payer: Self-pay | Admitting: Internal Medicine

## 2015-04-21 ENCOUNTER — Other Ambulatory Visit (INDEPENDENT_AMBULATORY_CARE_PROVIDER_SITE_OTHER): Payer: Medicare Other

## 2015-04-21 ENCOUNTER — Ambulatory Visit (INDEPENDENT_AMBULATORY_CARE_PROVIDER_SITE_OTHER): Payer: Medicare Other | Admitting: Internal Medicine

## 2015-04-21 VITALS — BP 158/82 | HR 60 | Temp 98.2°F | Resp 16 | Ht 62.0 in | Wt 126.0 lb

## 2015-04-21 DIAGNOSIS — R300 Dysuria: Secondary | ICD-10-CM | POA: Diagnosis not present

## 2015-04-21 DIAGNOSIS — E785 Hyperlipidemia, unspecified: Secondary | ICD-10-CM

## 2015-04-21 DIAGNOSIS — I10 Essential (primary) hypertension: Secondary | ICD-10-CM

## 2015-04-21 DIAGNOSIS — E1140 Type 2 diabetes mellitus with diabetic neuropathy, unspecified: Secondary | ICD-10-CM

## 2015-04-21 DIAGNOSIS — Z23 Encounter for immunization: Secondary | ICD-10-CM | POA: Diagnosis not present

## 2015-04-21 DIAGNOSIS — I6523 Occlusion and stenosis of bilateral carotid arteries: Secondary | ICD-10-CM | POA: Diagnosis not present

## 2015-04-21 DIAGNOSIS — M25511 Pain in right shoulder: Secondary | ICD-10-CM

## 2015-04-21 DIAGNOSIS — N3000 Acute cystitis without hematuria: Secondary | ICD-10-CM | POA: Insufficient documentation

## 2015-04-21 LAB — URINALYSIS, ROUTINE W REFLEX MICROSCOPIC
BILIRUBIN URINE: NEGATIVE
Hgb urine dipstick: NEGATIVE
Ketones, ur: NEGATIVE
Nitrite: NEGATIVE
PH: 6 (ref 5.0–8.0)
RBC / HPF: NONE SEEN (ref 0–?)
Total Protein, Urine: NEGATIVE
Urine Glucose: NEGATIVE
Urobilinogen, UA: 0.2 (ref 0.0–1.0)

## 2015-04-21 LAB — MICROALBUMIN / CREATININE URINE RATIO
Creatinine,U: 40.7 mg/dL
MICROALB UR: 0.8 mg/dL (ref 0.0–1.9)
Microalb Creat Ratio: 2 mg/g (ref 0.0–30.0)

## 2015-04-21 LAB — COMPREHENSIVE METABOLIC PANEL
ALBUMIN: 4.2 g/dL (ref 3.5–5.2)
ALK PHOS: 75 U/L (ref 39–117)
ALT: 19 U/L (ref 0–35)
AST: 22 U/L (ref 0–37)
BUN: 19 mg/dL (ref 6–23)
CO2: 31 mEq/L (ref 19–32)
CREATININE: 0.76 mg/dL (ref 0.40–1.20)
Calcium: 10.1 mg/dL (ref 8.4–10.5)
Chloride: 103 mEq/L (ref 96–112)
GFR: 78.75 mL/min (ref >60.00)
GLUCOSE: 97 mg/dL (ref 70–99)
Potassium: 4.5 mEq/L (ref 3.5–5.1)
SODIUM: 142 meq/L (ref 135–145)
TOTAL PROTEIN: 7.5 g/dL (ref 6.0–8.3)
Total Bilirubin: 1.2 mg/dL (ref 0.2–1.2)

## 2015-04-21 LAB — LIPID PANEL
CHOL/HDL RATIO: 4
Cholesterol: 120 mg/dL (ref 0–200)
HDL: 30 mg/dL — ABNORMAL LOW (ref >39.00)
LDL CALC: 62 mg/dL (ref 0–99)
NONHDL: 89.52
Triglycerides: 140 mg/dL (ref 0.0–149.0)
VLDL: 28 mg/dL (ref 0.0–40.0)

## 2015-04-21 LAB — HEMOGLOBIN A1C: HEMOGLOBIN A1C: 5.9 % (ref 4.6–6.5)

## 2015-04-21 LAB — TSH: TSH: 1.86 u[IU]/mL (ref 0.35–4.50)

## 2015-04-21 MED ORDER — CIPROFLOXACIN HCL 250 MG PO TABS: 250.0000 mg | ORAL_TABLET | Freq: Two times a day (BID) | ORAL | Status: DC

## 2015-04-21 MED ORDER — ASPIRIN 81 MG PO TBEC: 81.0000 mg | DELAYED_RELEASE_TABLET | Freq: Every day | ORAL | Status: DC

## 2015-04-21 MED ORDER — VALSARTAN 160 MG PO TABS: 160.0000 mg | ORAL_TABLET | Freq: Every day | ORAL | Status: DC

## 2015-04-21 NOTE — Progress Notes (Signed)
Pre visit review using our clinic review tool, if applicable. No additional management support is needed unless otherwise documented below in the visit note. 

## 2015-04-21 NOTE — Progress Notes (Signed)
Subjective:  Patient ID: [name], female    DOB: [birth year]  Age: 74 y.o. MRN: [id number]  CC: Urinary Tract Infection and Diabetes   HPI [name] presents for a follow-up on diabetes. She complains of a several week history of worsening dysuria, nocturia, frequency and urgency. She thought her blood pressure medicine valsartan was causing this so she stopped taking it. Unfortunately the symptoms did not improve despite the cessation of the valsartan therapy.  Outpatient Prescriptions Prior to Visit  Medication Sig Dispense Refill  . metFORMIN (GLUCOPHAGE) 500 MG tablet Take 1 tablet (500 mg total) by mouth 2 (two) times daily. 180 tablet 3  . propranolol-hydrochlorothiazide (INDERIDE) 40-25 MG per tablet Take 1 tablet by mouth daily. 30 tablet 5  . simvastatin (ZOCOR) 20 MG tablet Take 1 tablet (20 mg total) by mouth daily. 30 tablet 11  . HYDROcodone-acetaminophen (NORCO) 10-325 MG per tablet TAKE 1 TABLET BY MOUTH EVERY 6-8 HOURS AS NEEDED FOR PAIN  0  . traMADol (ULTRAM) 50 MG tablet TAKE 1 TABLET BY MOUTH EVERY 6-8 HOURS  0  . valsartan (DIOVAN) 160 MG tablet Take 1 tablet (160 mg total) by mouth daily. (Patient not taking: Reported on 04/21/2015) 90 tablet 3   No facility-administered medications prior to visit.    ROS Review of Systems  Constitutional: Negative.  Negative for fever, chills, diaphoresis, appetite change and fatigue.  HENT: Negative.   Eyes: Negative.   Respiratory: Negative.  Negative for cough, choking, chest tightness, shortness of breath and stridor.   Cardiovascular: Negative.  Negative for chest pain, palpitations and leg swelling.  Gastrointestinal: Negative.  Negative for nausea, vomiting, abdominal pain, diarrhea and constipation.  Endocrine: Negative.   Genitourinary: Positive for dysuria, urgency and frequency. Negative for hematuria, flank pain, decreased urine volume, difficulty urinating and vaginal pain.  Musculoskeletal: Positive for  arthralgias (she complains of right shoulder pain and limited range of motion for several months). Negative for myalgias, back pain and joint swelling.  Skin: Negative.  Negative for color change and rash.  Allergic/Immunologic: Negative.   Neurological: Negative.  Negative for dizziness, tremors, weakness, light-headedness, numbness and headaches.  Hematological: Negative.  Negative for adenopathy. Does not bruise/bleed easily.  Psychiatric/Behavioral: Negative.     Objective:  BP 158/82 mmHg  Pulse 60  Temp(Src) 98.2 F (36.8 C) (Oral)  Resp 16  Ht 5\' 2"  (1.575 m)  Wt 126 lb (57.153 kg)  BMI 23.04 kg/m2  SpO2 97%  BP Readings from Last 3 Encounters:  04/21/15 158/82  01/19/15 148/86  12/24/14 158/67    Wt Readings from Last 3 Encounters:  04/21/15 126 lb (57.153 kg)  01/19/15 134 lb (60.782 kg)  12/22/14 141 lb (63.957 kg)    Physical Exam  Constitutional: She is oriented to person, place, and time. She appears well-developed and well-nourished. No distress.  HENT:  Head: Normocephalic and atraumatic.  Mouth/Throat: Oropharynx is clear and moist. No oropharyngeal exudate.  Eyes: Conjunctivae are normal. Right eye exhibits no discharge. Left eye exhibits no discharge. No scleral icterus.  Neck: Normal range of motion. Neck supple. No JVD present. No tracheal deviation present. No thyromegaly present.  Cardiovascular: Normal rate, regular rhythm, normal heart sounds and intact distal pulses.  Exam reveals no gallop and no friction rub.   No murmur heard. Pulmonary/Chest: Effort normal and breath sounds normal. No stridor. No respiratory distress. She has no wheezes. She has no rales. She exhibits no tenderness.  Abdominal: Soft. Bowel sounds  are normal. She exhibits no distension and no mass. There is no hepatosplenomegaly. There is no tenderness. There is no rebound, no guarding and no CVA tenderness.  Musculoskeletal: She exhibits no edema or tenderness.       Right  shoulder: She exhibits decreased range of motion. She exhibits no tenderness, no bony tenderness, no swelling, no effusion, no crepitus, no deformity, no laceration, no pain, no spasm, normal pulse and normal strength.  Lymphadenopathy:    She has no cervical adenopathy.  Neurological: She is oriented to person, place, and time.  Skin: Skin is warm and dry. No rash noted. She is not diaphoretic. No erythema. No pallor.  Vitals reviewed.   Lab Results  Component Value Date   WBC 7.9 12/18/2014   HGB 13.2 12/18/2014   HCT 39.6 12/18/2014   PLT 369 12/18/2014   GLUCOSE 97 04/21/2015   CHOL 120 04/21/2015   TRIG 140.0 04/21/2015   HDL 30.00* 04/21/2015   LDLDIRECT 81.6 12/31/2009   LDLCALC 62 04/21/2015   ALT 19 04/21/2015   AST 22 04/21/2015   NA 142 04/21/2015   K 4.5 04/21/2015   CL 103 04/21/2015   CREATININE 0.76 04/21/2015   BUN 19 04/21/2015   CO2 31 04/21/2015   TSH 1.86 04/21/2015   HGBA1C 5.9 04/21/2015   MICROALBUR 0.8 04/21/2015    No results found.  Assessment & Plan:   [name] was seen today for urinary tract infection and diabetes.  Diagnoses and all orders for this visit:  Need for 23-polyvalent pneumococcal polysaccharide vaccine  Controlled type 2 diabetes mellitus with diabetic neuropathy, without long-term current use of insulin ([name])- her blood sugars are well-controlled, her renal function is stable. -     Lipid panel; Future -     Microalbumin / creatinine urine ratio; Future -     Comprehensive metabolic panel; Future -     Hemoglobin A1c; Future -     valsartan (DIOVAN) 160 MG tablet; Take 1 tablet (160 mg total) by mouth daily.  Carotid artery stenosis, bilateral- we'll continue risk modifications with an aspirin a day, good blood pressure and blood sugar control, an ARB and a statin. -     Lipid panel; Future -     valsartan (DIOVAN) 160 MG tablet; Take 1 tablet (160 mg total) by mouth daily.  Essential hypertension- her blood pressure is not  well controlled, she will restart the ARB. Today I will monitor her electrolytes and renal function. -     Comprehensive metabolic panel; Future -     valsartan (DIOVAN) 160 MG tablet; Take 1 tablet (160 mg total) by mouth daily.  Hyperlipidemia with target LDL less than 100- she has achieved her LDL goal is doing well on the statin. -     Lipid panel; Future -     Comprehensive metabolic panel; Future -     TSH; Future  Dysuria- her urine is positive for leukocytes, her urine culture is pending. Will empirically treat with Cipro -     Urinalysis, Routine w reflex microscopic (not at [facility]); Future -     CULTURE, URINE COMPREHENSIVE; Future -     ciprofloxacin (CIPRO) 250 MG tablet; Take 1 tablet (250 mg total) by mouth 2 (two) times daily.  Right shoulder pain -     Ambulatory referral to Sports Medicine  Encounter for immunization  Acute cystitis without hematuria -     ciprofloxacin (CIPRO) 250 MG tablet; Take 1 tablet (250 mg total)  by mouth 2 (two) times daily.  Other orders -     Flu vaccine HIGH DOSE PF   I have discontinued Ms. Courtwright's HYDROcodone-acetaminophen. I am also having her start on ciprofloxacin. Additionally, I am having her maintain her metFORMIN, simvastatin, propranolol-hydrochlorothiazide, traMADol, and valsartan.  Meds ordered this encounter  Medications  . valsartan (DIOVAN) 160 MG tablet    Sig: Take 1 tablet (160 mg total) by mouth daily.    Dispense:  90 tablet    Refill:  3  . ciprofloxacin (CIPRO) 250 MG tablet    Sig: Take 1 tablet (250 mg total) by mouth 2 (two) times daily.    Dispense:  10 tablet    Refill:  1     Follow-up: Return in about 4 months (around 08/19/2015).  Scarlette Calico, MD

## 2015-04-21 NOTE — Patient Instructions (Signed)
Hypertension Hypertension, commonly called high blood pressure, is when the force of blood pumping through your arteries is too strong. Your arteries are the blood vessels that carry blood from your heart throughout your body. A blood pressure reading consists of a higher number over a lower number, such as 110/72. The higher number (systolic) is the pressure inside your arteries when your heart pumps. The lower number (diastolic) is the pressure inside your arteries when your heart relaxes. Ideally you want your blood pressure below 120/80. Hypertension forces your heart to work harder to pump blood. Your arteries may become narrow or stiff. Having untreated or uncontrolled hypertension can cause heart attack, stroke, kidney disease, and other problems. RISK FACTORS Some risk factors for high blood pressure are controllable. Others are not.  Risk factors you cannot control include:   Race. You may be at higher risk if you are African American.  Age. Risk increases with age.  Gender. Men are at higher risk than women before age 45 years. After age 65, women are at higher risk than men. Risk factors you can control include:  Not getting enough exercise or physical activity.  Being overweight.  Getting too much fat, sugar, calories, or salt in your diet.  Drinking too much alcohol. SIGNS AND SYMPTOMS Hypertension does not usually cause signs or symptoms. Extremely high blood pressure (hypertensive crisis) may cause headache, anxiety, shortness of breath, and nosebleed. DIAGNOSIS To check if you have hypertension, your health care provider will measure your blood pressure while you are seated, with your arm held at the level of your heart. It should be measured at least twice using the same arm. Certain conditions can cause a difference in blood pressure between your right and left arms. A blood pressure reading that is higher than normal on one occasion does not mean that you need treatment. If  it is not clear whether you have high blood pressure, you may be asked to return on a different day to have your blood pressure checked again. Or, you may be asked to monitor your blood pressure at home for 1 or more weeks. TREATMENT Treating high blood pressure includes making lifestyle changes and possibly taking medicine. Living a healthy lifestyle can help lower high blood pressure. You may need to change some of your habits. Lifestyle changes may include:  Following the DASH diet. This diet is high in fruits, vegetables, and whole grains. It is low in salt, red meat, and added sugars.  Keep your sodium intake below 2,300 mg per day.  Getting at least 30-45 minutes of aerobic exercise at least 4 times per week.  Losing weight if necessary.  Not smoking.  Limiting alcoholic beverages.  Learning ways to reduce stress. Your health care provider may prescribe medicine if lifestyle changes are not enough to get your blood pressure under control, and if one of the following is true:  You are 18-59 years of age and your systolic blood pressure is above 140.  You are 60 years of age or older, and your systolic blood pressure is above 150.  Your diastolic blood pressure is above 90.  You have diabetes, and your systolic blood pressure is over 140 or your diastolic blood pressure is over 90.  You have kidney disease and your blood pressure is above 140/90.  You have heart disease and your blood pressure is above 140/90. Your personal target blood pressure may vary depending on your medical conditions, your age, and other factors. HOME CARE INSTRUCTIONS    Have your blood pressure rechecked as directed by your health care provider.   Take medicines only as directed by your health care provider. Follow the directions carefully. Blood pressure medicines must be taken as prescribed. The medicine does not work as well when you skip doses. Skipping doses also puts you at risk for  problems.  Do not smoke.   Monitor your blood pressure at home as directed by your health care provider. SEEK MEDICAL CARE IF:   You think you are having a reaction to medicines taken.  You have recurrent headaches or feel dizzy.  You have swelling in your ankles.  You have trouble with your vision. SEEK IMMEDIATE MEDICAL CARE IF:  You develop a severe headache or confusion.  You have unusual weakness, numbness, or feel faint.  You have severe chest or abdominal pain.  You vomit repeatedly.  You have trouble breathing. MAKE SURE YOU:   Understand these instructions.  Will watch your condition.  Will get help right away if you are not doing well or get worse.   This information is not intended to replace advice given to you by your health care provider. Make sure you discuss any questions you have with your health care provider.   Document Released: 05/08/2005 Document Revised: 09/22/2014 Document Reviewed: 02/28/2013 Elsevier Interactive Patient Education 2016 [employer]  

## 2015-04-23 ENCOUNTER — Encounter: Payer: Self-pay | Admitting: Internal Medicine

## 2015-04-23 ENCOUNTER — Ambulatory Visit (INDEPENDENT_AMBULATORY_CARE_PROVIDER_SITE_OTHER): Payer: Medicare Other | Admitting: Internal Medicine

## 2015-04-23 VITALS — BP 120/64 | HR 63 | Temp 97.9°F | Ht 62.0 in | Wt 126.8 lb

## 2015-04-23 DIAGNOSIS — L03114 Cellulitis of left upper limb: Secondary | ICD-10-CM | POA: Diagnosis not present

## 2015-04-23 DIAGNOSIS — N3090 Cystitis, unspecified without hematuria: Secondary | ICD-10-CM | POA: Diagnosis not present

## 2015-04-23 DIAGNOSIS — I6523 Occlusion and stenosis of bilateral carotid arteries: Secondary | ICD-10-CM

## 2015-04-23 NOTE — Patient Instructions (Addendum)
Drink as much nondairy fluids as possible. Avoid spicy foods or alcohol as  these may aggravate the bladderstate. Do not take decongestants. Avoid narcotics if possible.  Use your cell phone camera to monitor  the skin changes . Take a photo of the skin lesions every day with a small ruler immediately beside the lesion to define any change in size, shape or color.  Use cool moist compresses 3- 4 times a day to the affected area.

## 2015-04-23 NOTE — Progress Notes (Signed)
   Subjective:    Patient ID: [name], female    DOB: 04-Jan-1941, 74 y.o.   MRN: [id number]  HPI  She had a flu shot 04/21/15 and has subsequently developed erythema around the site of the injection. She has not treated this in any fashion but feels that the redness has decreased in circumference.  At that visit she was prescribed Cipro because of occasional dysuria and chills which has been present for 2 weeks.  Urinalysis was abnormal with leukocytes but negative for nitrites.  Urine culture reveals gram-negative rods with 50,000 colonies. The culture is preliminary.  Review of Systems Extrinsic symptoms of itchy, watery eyes, sneezing, or angioedema are denied. There is no significant cough, sputum production, wheezing,or  paroxysmal nocturnal dyspnea.  Dysuria, pyuria, hematuria, frequency, nocturia or polyuria are denied @ this time.      Objective:   Physical Exam There is an area of faint erythema 13.5 x 13 cm over the left upper extremity. This is slightly warm to touch with some subcutaneous induration. She has minor rales in the right lower lobe without increased work of breathing.  General appearance:Adequately nourished; no acute distress.  Lymphatic: No  lymphadenopathy about the head, neck,epitrochlear area or axilla .  Eyes: No conjunctival inflammation or lid edema is present. There is no scleral icterus.  Ears:  External ear exam shows no significant lesions or deformities.    Nose:  External nasal examination shows no deformity or inflammation. Nasal mucosa are pink and moist without lesions or exudates.No septal dislocation or deviation.No obstruction to airflow.   Oral exam: Dental hygiene is good; lips and gums are healthy appearing.There is no oropharyngeal erythema or exudate .  Neck:  No deformities, thyromegaly, masses, or tenderness noted.   Supple with full range of motion without pain.   Heart:  Normal rate and regular rhythm. S1 and S2 normal  without gallop, murmur, click, rub or other extra sounds.   Extremities:  No cyanosis, edema, or clubbing  noted    Skin: Warm & dry w/o tenting or jaundice.      Assessment & Plan:  #1 low-grade cellulitis, resolving   #2 dysuria and chills with abnormal urinalysis. Culture suggest cystitis rather than urinary tract infection based on standard of >100,000 colonies.

## 2015-04-23 NOTE — Progress Notes (Signed)
Pre visit review using our clinic review tool, if applicable. No additional management support is needed unless otherwise documented below in the visit note. 

## 2015-04-24 LAB — CULTURE, URINE COMPREHENSIVE

## 2015-04-25 ENCOUNTER — Encounter: Payer: Self-pay | Admitting: Internal Medicine

## 2015-06-01 ENCOUNTER — Other Ambulatory Visit: Payer: Self-pay

## 2015-06-01 MED ORDER — PROPRANOLOL-HCTZ 40-25 MG PO TABS: 1.0000 | ORAL_TABLET | Freq: Every day | ORAL | Status: DC

## 2015-06-05 ENCOUNTER — Other Ambulatory Visit: Payer: Self-pay | Admitting: Internal Medicine

## 2015-06-09 ENCOUNTER — Telehealth: Payer: Self-pay

## 2015-06-09 DIAGNOSIS — I10 Essential (primary) hypertension: Secondary | ICD-10-CM

## 2015-06-09 NOTE — Telephone Encounter (Signed)
PA started on 06/08/15  KEY: [id number]  Submitted clinical data for PA on 06/09/2015

## 2015-06-11 NOTE — Telephone Encounter (Signed)
Submitted clinical data and rationale indicating medical necessity for listed medication.

## 2015-06-14 NOTE — Telephone Encounter (Signed)
PA has been denied.   Pls advise on alternatives.

## 2015-06-14 NOTE — Telephone Encounter (Signed)
Pt called regarding the PA. I let her know that we are waiting for doctors response on alternatives.  She is aware he is out all week and she has enough medication till then because she went ahead and paid out of pocket for a months supply. Can you please give her a call when the alternative is at the pharmacy.

## 2015-06-18 MED ORDER — HYDROCHLOROTHIAZIDE 25 MG PO TABS: 25.0000 mg | ORAL_TABLET | Freq: Every day | ORAL | Status: DC

## 2015-06-18 MED ORDER — PROPRANOLOL HCL 20 MG PO TABS: 20.0000 mg | ORAL_TABLET | Freq: Two times a day (BID) | ORAL | Status: DC

## 2015-06-18 NOTE — Addendum Note (Signed)
Addended by: Janith Lima on: 06/18/2015 05:53 PM   Modules accepted: Orders, Medications

## 2015-06-18 NOTE — Telephone Encounter (Signed)
changed

## 2015-06-21 NOTE — Telephone Encounter (Signed)
Pt informed of the change in rx.   Pt has some confusion on how to take the new medication. Pt will call back when she finishes the propanolol HCTZ that she paid for out of pocket since she is so confused.  I explained that we would be happy to go over the differences again when needed.

## 2015-07-05 DIAGNOSIS — I10 Essential (primary) hypertension: Secondary | ICD-10-CM | POA: Diagnosis not present

## 2015-07-05 DIAGNOSIS — G5603 Carpal tunnel syndrome, bilateral upper limbs: Secondary | ICD-10-CM | POA: Diagnosis not present

## 2015-07-14 DIAGNOSIS — G5601 Carpal tunnel syndrome, right upper limb: Secondary | ICD-10-CM | POA: Diagnosis not present

## 2015-08-17 ENCOUNTER — Encounter: Payer: Self-pay | Admitting: Internal Medicine

## 2015-08-17 ENCOUNTER — Ambulatory Visit (INDEPENDENT_AMBULATORY_CARE_PROVIDER_SITE_OTHER): Payer: Medicare Other | Admitting: Internal Medicine

## 2015-08-17 ENCOUNTER — Other Ambulatory Visit (INDEPENDENT_AMBULATORY_CARE_PROVIDER_SITE_OTHER): Payer: Medicare Other

## 2015-08-17 VITALS — BP 126/70 | HR 62 | Temp 98.0°F | Resp 16 | Ht 62.0 in | Wt 125.0 lb

## 2015-08-17 DIAGNOSIS — E1140 Type 2 diabetes mellitus with diabetic neuropathy, unspecified: Secondary | ICD-10-CM | POA: Diagnosis not present

## 2015-08-17 DIAGNOSIS — M109 Gout, unspecified: Secondary | ICD-10-CM | POA: Diagnosis not present

## 2015-08-17 DIAGNOSIS — I10 Essential (primary) hypertension: Secondary | ICD-10-CM

## 2015-08-17 LAB — BASIC METABOLIC PANEL
BUN: 21 mg/dL (ref 6–23)
CALCIUM: 9.8 mg/dL (ref 8.4–10.5)
CO2: 31 mEq/L (ref 19–32)
CREATININE: 0.81 mg/dL (ref 0.40–1.20)
Chloride: 103 mEq/L (ref 96–112)
GFR: 73.1 mL/min (ref >60.00)
Glucose, Bld: 84 mg/dL (ref 70–99)
Potassium: 4.2 mEq/L (ref 3.5–5.1)
Sodium: 140 mEq/L (ref 135–145)

## 2015-08-17 LAB — URIC ACID: URIC ACID, SERUM: 7.3 mg/dL — AB (ref 2.4–7.0)

## 2015-08-17 LAB — HEMOGLOBIN A1C: Hgb A1c MFr Bld: 6.1 % (ref 4.6–6.5)

## 2015-08-17 NOTE — Progress Notes (Signed)
Subjective:  Patient ID: [name], female    DOB: 06/15/1940  Age: 75 y.o. MRN: [id number]  CC: Hypertension and Diabetes   HPI [name] presents for follow-up on hypertension and diabetes. She tells me that her blood pressure and her blood sugars have been well controlled. She is tolerating hydrochlorothiazide, propranolol, and valsartan well with no side effects such as palpitations, headache, chest pain, shortness of breath, dyspnea on exertion, edema, or fatigue.  Outpatient Prescriptions Prior to Visit  Medication Sig Dispense Refill  . aspirin 81 MG EC tablet Take 1 tablet (81 mg total) by mouth daily. Swallow whole. 30 tablet 12  . hydrochlorothiazide (HYDRODIURIL) 25 MG tablet Take 1 tablet (25 mg total) by mouth daily. 90 tablet 1  . metFORMIN (GLUCOPHAGE) 500 MG tablet Take 1 tablet (500 mg total) by mouth 2 (two) times daily. 180 tablet 3  . propranolol (INDERAL) 20 MG tablet Take 1 tablet (20 mg total) by mouth 2 (two) times daily. 180 tablet 3  . simvastatin (ZOCOR) 20 MG tablet Take 1 tablet (20 mg total) by mouth daily. 30 tablet 11  . valsartan (DIOVAN) 160 MG tablet Take 1 tablet (160 mg total) by mouth daily. 90 tablet 3  . ciprofloxacin (CIPRO) 250 MG tablet Take 1 tablet (250 mg total) by mouth 2 (two) times daily. (Patient not taking: Reported on 08/17/2015) 10 tablet 1   No facility-administered medications prior to visit.    ROS Review of Systems  Constitutional: Negative.  Negative for fever, chills, diaphoresis, appetite change and fatigue.  HENT: Negative.   Eyes: Negative.   Respiratory: Negative.  Negative for cough, choking, chest tightness, shortness of breath and stridor.   Cardiovascular: Negative.  Negative for chest pain, palpitations and leg swelling.  Gastrointestinal: Negative.  Negative for nausea, vomiting, abdominal pain, diarrhea and constipation.  Endocrine: Negative.  Negative for polydipsia, polyphagia and polyuria.  Genitourinary:  Negative.  Negative for difficulty urinating.  Musculoskeletal: Negative.  Negative for myalgias, back pain, joint swelling and arthralgias.  Skin: Negative.  Negative for color change and rash.  Allergic/Immunologic: Negative.   Neurological: Negative.  Negative for dizziness, tremors, weakness, light-headedness, numbness and headaches.  Hematological: Negative.  Negative for adenopathy. Does not bruise/bleed easily.  Psychiatric/Behavioral: Negative.     Objective:  BP 126/70 mmHg  Pulse 62  Temp(Src) 98 F (36.7 C) (Oral)  Resp 16  Ht 5\' 2"  (1.575 m)  Wt 125 lb (56.7 kg)  BMI 22.86 kg/m2  SpO2 98%  BP Readings from Last 3 Encounters:  08/17/15 126/70  04/23/15 120/64  04/21/15 158/82    Wt Readings from Last 3 Encounters:  08/17/15 125 lb (56.7 kg)  04/23/15 126 lb 12 oz (57.493 kg)  04/21/15 126 lb (57.153 kg)    Physical Exam  Constitutional: She is oriented to person, place, and time. No distress.  HENT:  Mouth/Throat: Oropharynx is clear and moist. No oropharyngeal exudate.  Eyes: Conjunctivae are normal. Right eye exhibits no discharge. Left eye exhibits no discharge. No scleral icterus.  Neck: Normal range of motion. Neck supple. No JVD present. No tracheal deviation present. No thyromegaly present.  Cardiovascular: Normal rate, regular rhythm, normal heart sounds and intact distal pulses.  Exam reveals no gallop and no friction rub.   No murmur heard. Pulmonary/Chest: Effort normal and breath sounds normal. No stridor. No respiratory distress. She has no wheezes. She has no rales. She exhibits no tenderness.  Abdominal: Soft. Bowel sounds are normal.  She exhibits no distension and no mass. There is no tenderness. There is no rebound and no guarding.  Musculoskeletal: Normal range of motion. She exhibits no edema.  Lymphadenopathy:    She has no cervical adenopathy.  Neurological: She is oriented to person, place, and time.  Skin: Skin is warm and dry. No rash  noted. She is not diaphoretic. No erythema. No pallor.  Vitals reviewed.   Lab Results  Component Value Date   WBC 7.9 12/18/2014   HGB 13.2 12/18/2014   HCT 39.6 12/18/2014   PLT 369 12/18/2014   GLUCOSE 84 08/17/2015   CHOL 120 04/21/2015   TRIG 140.0 04/21/2015   HDL 30.00* 04/21/2015   LDLDIRECT 81.6 12/31/2009   LDLCALC 62 04/21/2015   ALT 19 04/21/2015   AST 22 04/21/2015   NA 140 08/17/2015   K 4.2 08/17/2015   CL 103 08/17/2015   CREATININE 0.81 08/17/2015   BUN 21 08/17/2015   CO2 31 08/17/2015   TSH 1.86 04/21/2015   HGBA1C 6.1 08/17/2015   MICROALBUR 0.8 04/21/2015    No results found.  Assessment & Plan:   [name] was seen today for hypertension and diabetes.  Diagnoses and all orders for this visit:  GOUT- her uric acid is not significantly elevated and she has had no gout flares, no additional treatment indicated at this time. -     Uric acid; Future  Essential hypertension- her blood pressure is well-controlled, lites and renal function are stable. -     Basic metabolic panel; Future  Controlled type 2 diabetes mellitus with diabetic neuropathy, without long-term current use of insulin ([place])- her A1c is 6.1% indicating that her blood sugars are well-controlled, will continue metformin at the current dose. -     Basic metabolic panel; Future -     Hemoglobin A1c; Future  I have discontinued [name]'s ciprofloxacin. I am also having her maintain her metFORMIN, simvastatin, valsartan, aspirin, hydrochlorothiazide, and propranolol.  No orders of the defined types were placed in this encounter.     Follow-up: Return in about 6 months (around 02/17/2016).  Cynthia Calico, MD

## 2015-08-17 NOTE — Progress Notes (Signed)
Pre visit review using our clinic review tool, if applicable. No additional management support is needed unless otherwise documented below in the visit note. 

## 2015-08-17 NOTE — Patient Instructions (Signed)
Hypertension Hypertension, commonly called high blood pressure, is when the force of blood pumping through your arteries is too strong. Your arteries are the blood vessels that carry blood from your heart throughout your body. A blood pressure reading consists of a higher number over a lower number, such as 110/72. The higher number (systolic) is the pressure inside your arteries when your heart pumps. The lower number (diastolic) is the pressure inside your arteries when your heart relaxes. Ideally you want your blood pressure below 120/80. Hypertension forces your heart to work harder to pump blood. Your arteries may become narrow or stiff. Having untreated or uncontrolled hypertension can cause heart attack, stroke, kidney disease, and other problems. RISK FACTORS Some risk factors for high blood pressure are controllable. Others are not.  Risk factors you cannot control include:   Race. You may be at higher risk if you are African American.  Age. Risk increases with age.  Gender. Men are at higher risk than women before age 45 years. After age 65, women are at higher risk than men. Risk factors you can control include:  Not getting enough exercise or physical activity.  Being overweight.  Getting too much fat, sugar, calories, or salt in your diet.  Drinking too much alcohol. SIGNS AND SYMPTOMS Hypertension does not usually cause signs or symptoms. Extremely high blood pressure (hypertensive crisis) may cause headache, anxiety, shortness of breath, and nosebleed. DIAGNOSIS To check if you have hypertension, your health care provider will measure your blood pressure while you are seated, with your arm held at the level of your heart. It should be measured at least twice using the same arm. Certain conditions can cause a difference in blood pressure between your right and left arms. A blood pressure reading that is higher than normal on one occasion does not mean that you need treatment. If  it is not clear whether you have high blood pressure, you may be asked to return on a different day to have your blood pressure checked again. Or, you may be asked to monitor your blood pressure at home for 1 or more weeks. TREATMENT Treating high blood pressure includes making lifestyle changes and possibly taking medicine. Living a healthy lifestyle can help lower high blood pressure. You may need to change some of your habits. Lifestyle changes may include:  Following the DASH diet. This diet is high in fruits, vegetables, and whole grains. It is low in salt, red meat, and added sugars.  Keep your sodium intake below 2,300 mg per day.  Getting at least 30-45 minutes of aerobic exercise at least 4 times per week.  Losing weight if necessary.  Not smoking.  Limiting alcoholic beverages.  Learning ways to reduce stress. Your health care provider may prescribe medicine if lifestyle changes are not enough to get your blood pressure under control, and if one of the following is true:  You are 18-59 years of age and your systolic blood pressure is above 140.  You are 60 years of age or older, and your systolic blood pressure is above 150.  Your diastolic blood pressure is above 90.  You have diabetes, and your systolic blood pressure is over 140 or your diastolic blood pressure is over 90.  You have kidney disease and your blood pressure is above 140/90.  You have heart disease and your blood pressure is above 140/90. Your personal target blood pressure may vary depending on your medical conditions, your age, and other factors. HOME CARE INSTRUCTIONS    Have your blood pressure rechecked as directed by your health care provider.   Take medicines only as directed by your health care provider. Follow the directions carefully. Blood pressure medicines must be taken as prescribed. The medicine does not work as well when you skip doses. Skipping doses also puts you at risk for  problems.  Do not smoke.   Monitor your blood pressure at home as directed by your health care provider. SEEK MEDICAL CARE IF:   You think you are having a reaction to medicines taken.  You have recurrent headaches or feel dizzy.  You have swelling in your ankles.  You have trouble with your vision. SEEK IMMEDIATE MEDICAL CARE IF:  You develop a severe headache or confusion.  You have unusual weakness, numbness, or feel faint.  You have severe chest or abdominal pain.  You vomit repeatedly.  You have trouble breathing. MAKE SURE YOU:   Understand these instructions.  Will watch your condition.  Will get help right away if you are not doing well or get worse.   This information is not intended to replace advice given to you by your health care provider. Make sure you discuss any questions you have with your health care provider.   Document Released: 05/08/2005 Document Revised: 09/22/2014 Document Reviewed: 02/28/2013 Elsevier Interactive Patient Education 2016 [employer]  

## 2015-09-09 ENCOUNTER — Other Ambulatory Visit: Payer: Self-pay | Admitting: Internal Medicine

## 2015-09-30 ENCOUNTER — Other Ambulatory Visit: Payer: Self-pay | Admitting: Internal Medicine

## 2015-10-21 DIAGNOSIS — E119 Type 2 diabetes mellitus without complications: Secondary | ICD-10-CM | POA: Diagnosis not present

## 2015-10-21 DIAGNOSIS — H52221 Regular astigmatism, right eye: Secondary | ICD-10-CM | POA: Diagnosis not present

## 2015-10-21 DIAGNOSIS — H5213 Myopia, bilateral: Secondary | ICD-10-CM | POA: Diagnosis not present

## 2015-10-21 DIAGNOSIS — H524 Presbyopia: Secondary | ICD-10-CM | POA: Diagnosis not present

## 2015-10-21 DIAGNOSIS — H25813 Combined forms of age-related cataract, bilateral: Secondary | ICD-10-CM | POA: Diagnosis not present

## 2015-10-21 DIAGNOSIS — Z7984 Long term (current) use of oral hypoglycemic drugs: Secondary | ICD-10-CM | POA: Diagnosis not present

## 2015-10-21 LAB — HM DIABETES EYE EXAM

## 2015-11-09 NOTE — Addendum Note (Signed)
Addended by: Janith Lima on: 11/09/2015 12:38 PM   Modules accepted: Miquel Dunn

## 2015-11-24 ENCOUNTER — Ambulatory Visit (INDEPENDENT_AMBULATORY_CARE_PROVIDER_SITE_OTHER): Payer: Medicare Other | Admitting: Internal Medicine

## 2015-11-24 ENCOUNTER — Encounter: Payer: Self-pay | Admitting: Internal Medicine

## 2015-11-24 VITALS — BP 128/70 | HR 70 | Temp 98.0°F | Resp 16 | Ht 62.0 in | Wt 132.0 lb

## 2015-11-24 DIAGNOSIS — E1140 Type 2 diabetes mellitus with diabetic neuropathy, unspecified: Secondary | ICD-10-CM | POA: Diagnosis not present

## 2015-11-24 DIAGNOSIS — I70213 Atherosclerosis of native arteries of extremities with intermittent claudication, bilateral legs: Secondary | ICD-10-CM

## 2015-11-24 DIAGNOSIS — I10 Essential (primary) hypertension: Secondary | ICD-10-CM

## 2015-11-24 MED ORDER — HYDROCHLOROTHIAZIDE 25 MG PO TABS: 25.0000 mg | ORAL_TABLET | Freq: Every day | ORAL | Status: DC

## 2015-11-24 MED ORDER — METFORMIN HCL 500 MG PO TABS: 500.0000 mg | ORAL_TABLET | Freq: Two times a day (BID) | ORAL | Status: DC

## 2015-11-24 NOTE — Progress Notes (Signed)
Subjective:  Patient ID: [name], female    DOB: 09-02-1940  Age: 75 y.o. MRN: [id number]  CC: Hypertension   HPI BARBIE GOVERT presents for a blood pressure check. She tells me her blood pressure has been well controlled with the combination of an ARB plus a thiazide diuretic. She's had no recent episodes of headache/blurred vision/chest pain/shortness of breath/edema/dyspnea on exertion/fatigue. She does complain for many months that she has noticed some of her toes intermittently turn purple. The discoloration is not associated with pain or swelling. She is not very active but denies claudication. She quit smoking about 20 years ago but prior to that had a 30-pack-year history of tobacco abuse. Her other risk factors per peripheral vascular disease are high cholesterol and diabetes.  Outpatient Prescriptions Prior to Visit  Medication Sig Dispense Refill  . aspirin 81 MG EC tablet Take 1 tablet (81 mg total) by mouth daily. Swallow whole. 30 tablet 12  . propranolol (INDERAL) 20 MG tablet Take 1 tablet (20 mg total) by mouth 2 (two) times daily. 180 tablet 3  . simvastatin (ZOCOR) 20 MG tablet TAKE 1 TABLET BY MOUTH EVERY DAY 90 tablet 2  . valsartan (DIOVAN) 160 MG tablet Take 1 tablet (160 mg total) by mouth daily. 90 tablet 3  . hydrochlorothiazide (HYDRODIURIL) 25 MG tablet Take 1 tablet (25 mg total) by mouth daily. 90 tablet 1  . metFORMIN (GLUCOPHAGE) 500 MG tablet TAKE 1 TABLET BY MOUTH TWICE DAILY 180 tablet 0   No facility-administered medications prior to visit.    ROS Review of Systems  Constitutional: Negative.  Negative for fever, chills, diaphoresis, appetite change and fatigue.  HENT: Negative.   Eyes: Negative.   Respiratory: Negative.  Negative for cough, choking, chest tightness, shortness of breath and stridor.   Cardiovascular: Negative.  Negative for chest pain, palpitations and leg swelling.  Gastrointestinal: Negative.  Negative for abdominal pain.    Endocrine: Negative.   Genitourinary: Negative.   Allergic/Immunologic: Negative.   Neurological: Negative.   Hematological: Negative.  Negative for adenopathy. Does not bruise/bleed easily.  Psychiatric/Behavioral: Negative.     Objective:  BP 128/70 mmHg  Pulse 70  Temp(Src) 98 F (36.7 C) (Oral)  Resp 16  Ht 5\' 2"  (1.575 m)  Wt 132 lb (59.875 kg)  BMI 24.14 kg/m2  SpO2 95%  BP Readings from Last 3 Encounters:  11/24/15 128/70  08/17/15 126/70  04/23/15 120/64    Wt Readings from Last 3 Encounters:  11/24/15 132 lb (59.875 kg)  08/17/15 125 lb (56.7 kg)  04/23/15 126 lb 12 oz (57.493 kg)    Physical Exam  Constitutional: She is oriented to person, place, and time. No distress.  HENT:  Mouth/Throat: Oropharynx is clear and moist. No oropharyngeal exudate.  Eyes: Conjunctivae are normal. Right eye exhibits no discharge. Left eye exhibits no discharge. No scleral icterus.  Neck: Normal range of motion. Neck supple. No JVD present. No tracheal deviation present. No thyromegaly present.  Cardiovascular: Normal rate, regular rhythm, normal heart sounds and intact distal pulses.  Exam reveals no gallop and no friction rub.   No murmur heard. Pulmonary/Chest: Effort normal and breath sounds normal. No stridor. No respiratory distress. She has no wheezes. She has no rales. She exhibits no tenderness.  Abdominal: Soft. Bowel sounds are normal. She exhibits no distension and no mass. There is no tenderness. There is no rebound and no guarding.  Musculoskeletal: Normal range of motion. She exhibits no edema  or tenderness.  Several of her toes show bluish mottling with slightly decreased capillary refill. With a change in position they return to normal color. There are no wounds or evidence of infarcts. The pulses are diminished in both dorsalis pedis and posterior tibials.  Lymphadenopathy:    She has no cervical adenopathy.  Neurological: She is oriented to person, place, and  time.  Skin: Skin is warm and dry. No rash noted. She is not diaphoretic. No erythema. No pallor.  Vitals reviewed.   Lab Results  Component Value Date   WBC 7.9 12/18/2014   HGB 13.2 12/18/2014   HCT 39.6 12/18/2014   PLT 369 12/18/2014   GLUCOSE 84 08/17/2015   CHOL 120 04/21/2015   TRIG 140.0 04/21/2015   HDL 30.00* 04/21/2015   LDLDIRECT 81.6 12/31/2009   LDLCALC 62 04/21/2015   ALT 19 04/21/2015   AST 22 04/21/2015   NA 140 08/17/2015   K 4.2 08/17/2015   CL 103 08/17/2015   CREATININE 0.81 08/17/2015   BUN 21 08/17/2015   CO2 31 08/17/2015   TSH 1.86 04/21/2015   HGBA1C 6.1 08/17/2015   MICROALBUR 0.8 04/21/2015    No results found.  Assessment & Plan:   Sasha was seen today for hypertension.  Diagnoses and all orders for this visit:  Essential hypertension- her blood pressures adequately well-controlled -     hydrochlorothiazide (HYDRODIURIL) 25 MG tablet; Take 1 tablet (25 mg total) by mouth daily.  Atherosclerosis of native artery of both lower extremities with intermittent claudication ([place])- I have ordered arterial flow studies to see if she has critical limb ischemia -     VAS [place] LE ART SEG MULTI (Segm&LE Reynauds); Future  Controlled type 2 diabetes mellitus with diabetic neuropathy, without long-term current use of insulin ([place])- her blood sugars are adequately well-controlled -     metFORMIN (GLUCOPHAGE) 500 MG tablet; Take 1 tablet (500 mg total) by mouth 2 (two) times daily.  I have changed Ms. Palmer's metFORMIN. I am also having her maintain her valsartan, aspirin, propranolol, simvastatin, and hydrochlorothiazide.  Meds ordered this encounter  Medications  . metFORMIN (GLUCOPHAGE) 500 MG tablet    Sig: Take 1 tablet (500 mg total) by mouth 2 (two) times daily.    Dispense:  180 tablet    Refill:  0  . hydrochlorothiazide (HYDRODIURIL) 25 MG tablet    Sig: Take 1 tablet (25 mg total) by mouth daily.    Dispense:  90 tablet    Refill:  1      Follow-up: Return in about 2 months (around 01/25/2016).  Scarlette Calico, MD

## 2015-11-24 NOTE — Progress Notes (Signed)
Pre visit review using our clinic review tool, if applicable. No additional management support is needed unless otherwise documented below in the visit note. 

## 2015-11-24 NOTE — Patient Instructions (Signed)
Intermittent Claudication Intermittent claudication is pain in your leg that occurs when you walk or exercise and goes away when you rest. The pain can occur in one or both legs. CAUSES Intermittent claudication is caused by the buildup of plaque within the major arteries in the body (atherosclerosis). The plaque, which makes arteries stiff and narrow, prevents enough blood from reaching your leg muscles. The pain occurs when you walk or exercise because your muscles need more blood when you are moving and exercising. RISK FACTORS Risk factors include:  A family history of atherosclerosis.  A personal history of stroke or heart disease.  Older age.  Being inactive or overweight.  Smoking cigarettes.  Having another health condition such as:  Diabetes.  High blood pressure.  High cholesterol. SIGNS AND SYMPTOMS  Your hip or leg may:   Ache.  Cramp.  Feel tight.  Feel weak.  Feel heavy. Over time, you may feel pain in your calf, thigh, or hip. DIAGNOSIS  Your health care provider may diagnose intermittent claudication based on your symptoms and medical history. Your health care provider may also do tests to learn more about your condition. These may include:  Blood tests.  An ultrasound.  Imaging tests such as angiography, magnetic resonance angiography (MRA), and computed tomography angiography (CTA). TREATMENT You may be treated for problems such as:  High blood pressure.  High cholesterol.  Diabetes. Other treatments may include:  Lifestyle changes such as:  Starting an exercise program.  Losing weight.  Quitting smoking.  Medicines to help restore blood flow through your legs.  Blood vessel surgery (angioplasty) to restore blood flow if your intermittent claudication is caused by severe peripheral artery disease. HOME CARE INSTRUCTIONS  Manage any other health conditions you have.  Eat a diet low in saturated fats and calories to maintain a  healthy weight.  Quit smoking, if you smoke.  Take medicines only as directed by your health care provider.  If your health care provider recommended an exercise program for you, follow it as directed. Your exercise program may involve:  Walking three or more times a week.  Walking until you have certain symptoms of intermittent claudication.  Resting until symptoms go away.  Gradually increasing walking time to about 50 minutes a day. SEEK MEDICAL CARE IF: Your condition is not getting better or is getting worse. SEEK IMMEDIATE MEDICAL CARE IF:   You have chest pain.  You have difficulty breathing.  You develop arm weakness.  You have trouble speaking.  Your face begins to droop. MAKE SURE YOU:  Understand these instructions.  Will watch your condition.  Will get help if you are not doing well or get worse.   This information is not intended to replace advice given to you by your health care provider. Make sure you discuss any questions you have with your health care provider.   Document Released: 03/10/2004 Document Revised: 05/29/2014 Document Reviewed: 08/14/2013 Elsevier Interactive Patient Education 2016 Elsevier Inc.  

## 2015-11-25 ENCOUNTER — Other Ambulatory Visit: Payer: Self-pay | Admitting: Internal Medicine

## 2015-11-25 DIAGNOSIS — I70213 Atherosclerosis of native arteries of extremities with intermittent claudication, bilateral legs: Secondary | ICD-10-CM

## 2015-11-30 ENCOUNTER — Ambulatory Visit: Payer: Medicare Other | Admitting: Internal Medicine

## 2015-12-09 ENCOUNTER — Ambulatory Visit (HOSPITAL_COMMUNITY)
Admission: RE | Admit: 2015-12-09 | Discharge: 2015-12-09 | Disposition: A | Discharge status: Home / Self Care | Payer: Medicare Other | Source: Ambulatory Visit | Attending: Cardiovascular Disease | Admitting: Cardiovascular Disease

## 2015-12-09 DIAGNOSIS — R938 Abnormal findings on diagnostic imaging of other specified body structures: Secondary | ICD-10-CM | POA: Insufficient documentation

## 2015-12-09 DIAGNOSIS — I70213 Atherosclerosis of native arteries of extremities with intermittent claudication, bilateral legs: Secondary | ICD-10-CM | POA: Insufficient documentation

## 2015-12-09 DIAGNOSIS — E119 Type 2 diabetes mellitus without complications: Secondary | ICD-10-CM | POA: Insufficient documentation

## 2015-12-09 DIAGNOSIS — R0989 Other specified symptoms and signs involving the circulatory and respiratory systems: Secondary | ICD-10-CM | POA: Diagnosis not present

## 2015-12-09 DIAGNOSIS — E785 Hyperlipidemia, unspecified: Secondary | ICD-10-CM | POA: Insufficient documentation

## 2015-12-09 DIAGNOSIS — I10 Essential (primary) hypertension: Secondary | ICD-10-CM | POA: Diagnosis not present

## 2015-12-13 ENCOUNTER — Other Ambulatory Visit: Payer: Self-pay | Admitting: Internal Medicine

## 2015-12-13 DIAGNOSIS — I10 Essential (primary) hypertension: Secondary | ICD-10-CM

## 2016-01-25 ENCOUNTER — Encounter: Payer: Self-pay | Admitting: Internal Medicine

## 2016-01-25 ENCOUNTER — Ambulatory Visit (INDEPENDENT_AMBULATORY_CARE_PROVIDER_SITE_OTHER): Payer: Medicare Other | Admitting: Internal Medicine

## 2016-01-25 DIAGNOSIS — M109 Gout, unspecified: Secondary | ICD-10-CM | POA: Diagnosis not present

## 2016-01-25 DIAGNOSIS — I70213 Atherosclerosis of native arteries of extremities with intermittent claudication, bilateral legs: Secondary | ICD-10-CM

## 2016-01-25 DIAGNOSIS — E1140 Type 2 diabetes mellitus with diabetic neuropathy, unspecified: Secondary | ICD-10-CM | POA: Diagnosis not present

## 2016-01-25 DIAGNOSIS — I10 Essential (primary) hypertension: Secondary | ICD-10-CM | POA: Diagnosis not present

## 2016-01-25 MED ORDER — PREDNISONE 10 MG PO TABS: ORAL_TABLET | ORAL | 0 refills | Status: DC

## 2016-01-25 NOTE — Assessment & Plan Note (Signed)
stable overall by history and exam, recent data reviewed with pt, and pt to continue medical treatment as before,  to f/u any worsening symptoms or concerns, pt to call for cbg > 200 on steroid tx, or onset polys

## 2016-01-25 NOTE — Progress Notes (Signed)
Subjective:    Patient ID: [name], female    DOB: Jul 18, 1940, 75 y.o.   MRN: [id number]  HPI  Here with 3 days onset sharp constant pain with 2-3+ red/swelling tender without fever, trauma, to right first MTP.  Just woke up with it.  Sheets make the foot hurt. This is first time she can recall, though has dx on her list of gout.  Pt denies chest pain, increased sob or doe, wheezing, orthopnea, PND, increased LE swelling, palpitations, dizziness or syncope.   Pt denies polydipsia, polyuria,  Past Medical History:  Diagnosis Date  . Contact dermatitis and other eczema, due to unspecified cause   . Displacement of lumbar intervertebral disc without myelopathy   . FH: cataracts   . Gout, unspecified   . Occlusion and stenosis of carotid artery without mention of cerebral infarction   . Other and unspecified hyperlipidemia   . Pain in joint, shoulder region   . Pain in limb   . Routine general medical examination at a health care facility   . Sciatica   . Type II or unspecified type diabetes mellitus without mention of complication, uncontrolled   . Unspecified essential hypertension   . Unspecified venous (peripheral) insufficiency    Past Surgical History:  Procedure Laterality Date  . ANTERIOR CERVICAL DECOMP/DISCECTOMY FUSION N/A 12/22/2014   Procedure: Anterior Cervical Four-Five/Five-Six/Six-Seven Decompression/Diskectomy/ and Fsion;  Surgeon: Leeroy Cha, MD;  Location: [facility];  Service: Neurosurgery;  Laterality: N/A;  C4-5 C5-6 C6-7 Anterior cervical decompression/diskectomy/fusion  . LAPAROSCOPIC APPENDECTOMY    . LUMBAR LAMINECTOMY  07/2008   [place]  . TUBAL LIGATION      reports that she quit smoking about 20 years ago. She does not have any smokeless tobacco history on file. She reports that she does not drink alcohol or use drugs. family history includes Alzheimer's disease in her mother; Coronary artery disease in her father; Diabetes in her mother; Hypertension  in her father. Allergies  Allergen Reactions  . Pneumococcal Vaccines Swelling    Per pt when received 2015  . Ampicillin Other (See Comments)    REACTION: causes urticaria  . Neomycin-Bacitracin Zn-Polymyx Rash  . Penicillins Other (See Comments)    REACTION: causes urticaria   Current Outpatient Prescriptions on File Prior to Visit  Medication Sig Dispense Refill  . aspirin 81 MG EC tablet Take 1 tablet (81 mg total) by mouth daily. Swallow whole. 30 tablet 12  . hydrochlorothiazide (HYDRODIURIL) 25 MG tablet Take 1 tablet (25 mg total) by mouth daily. 90 tablet 1  . hydrochlorothiazide (HYDRODIURIL) 25 MG tablet TAKE 1 TABLET(25 MG) BY MOUTH DAILY 90 tablet 1  . metFORMIN (GLUCOPHAGE) 500 MG tablet Take 1 tablet (500 mg total) by mouth 2 (two) times daily. 180 tablet 0  . propranolol (INDERAL) 20 MG tablet Take 1 tablet (20 mg total) by mouth 2 (two) times daily. 180 tablet 3  . simvastatin (ZOCOR) 20 MG tablet TAKE 1 TABLET BY MOUTH EVERY DAY 90 tablet 2  . valsartan (DIOVAN) 160 MG tablet Take 1 tablet (160 mg total) by mouth daily. 90 tablet 3   No current facility-administered medications on file prior to visit.    Review of Systems  Constitutional: Negative for unusual diaphoresis or night sweats HENT: Negative for ear swelling or discharge Eyes: Negative for worsening visual haziness  Respiratory: Negative for choking and stridor.   Gastrointestinal: Negative for distension or worsening eructation Genitourinary: Negative for retention or change in  urine volume.  Musculoskeletal: Negative for other MSK pain or swelling Skin: Negative for color change and worsening wound Neurological: Negative for tremors and numbness other than noted  Psychiatric/Behavioral: Negative for decreased concentration or agitation other than above       Objective:   Physical Exam BP 124/64   Pulse 88   Temp 98.3 F (36.8 C) (Oral)   Resp 20   Wt 138 lb (62.6 kg)   SpO2 97%   BMI 25.24  kg/m  VS noted,  Constitutional: Pt appears in no apparent distress HENT: Head: NCAT.  Right Ear: External ear normal.  Left Ear: External ear normal.  Eyes: . Pupils are equal, round, and reactive to light. Conjunctivae and EOM are normal Neck: Normal range of motion. Neck supple.  Cardiovascular: Normal rate and regular rhythm.   Pulmonary/Chest: Effort normal and breath sounds without rales or wheezing.  Neurological: Pt is alert. Not confused , motor grossly intact Skin: Skin is warm. No rash, no LE edema Psychiatric: Pt behavior is normal. No agitation.  Rght first MTP with 2-3+ red/tender/swelling       Assessment & Plan:

## 2016-01-25 NOTE — Progress Notes (Signed)
Pre visit review using our clinic review tool, if applicable. No additional management support is needed unless otherwise documented below in the visit note. 

## 2016-01-25 NOTE — Assessment & Plan Note (Signed)
stable overall by history and exam, recent data reviewed with pt, and pt to continue medical treatment as before,  to f/u any worsening symptoms or concerns BP Readings from Last 3 Encounters:  01/25/16 124/64  11/24/15 128/70  08/17/15 126/70

## 2016-01-25 NOTE — Assessment & Plan Note (Signed)
Mild to mod, for depomedrol IM, predpac asd, to f/u any worsening symptoms or concerns 

## 2016-01-25 NOTE — Patient Instructions (Signed)
You had the steroid shot today  Please take all new medication as prescribed - the prednisone  Please continue all other medications as before, and refills have been done if requested.  Please have the pharmacy call with any other refills you may need  Please keep your appointments with your specialists as you may have planned     

## 2016-02-21 ENCOUNTER — Other Ambulatory Visit: Payer: Self-pay | Admitting: Internal Medicine

## 2016-02-21 DIAGNOSIS — E1140 Type 2 diabetes mellitus with diabetic neuropathy, unspecified: Secondary | ICD-10-CM

## 2016-04-18 ENCOUNTER — Other Ambulatory Visit (INDEPENDENT_AMBULATORY_CARE_PROVIDER_SITE_OTHER): Payer: Medicare Other

## 2016-04-18 ENCOUNTER — Encounter: Payer: Self-pay | Admitting: Internal Medicine

## 2016-04-18 ENCOUNTER — Ambulatory Visit (INDEPENDENT_AMBULATORY_CARE_PROVIDER_SITE_OTHER): Payer: Medicare Other | Admitting: Internal Medicine

## 2016-04-18 VITALS — BP 124/84 | HR 96 | Temp 98.4°F | Resp 16 | Ht 62.0 in | Wt 139.1 lb

## 2016-04-18 DIAGNOSIS — I10 Essential (primary) hypertension: Secondary | ICD-10-CM

## 2016-04-18 DIAGNOSIS — E785 Hyperlipidemia, unspecified: Secondary | ICD-10-CM

## 2016-04-18 DIAGNOSIS — I6523 Occlusion and stenosis of bilateral carotid arteries: Secondary | ICD-10-CM

## 2016-04-18 DIAGNOSIS — M1000 Idiopathic gout, unspecified site: Secondary | ICD-10-CM

## 2016-04-18 DIAGNOSIS — Z23 Encounter for immunization: Secondary | ICD-10-CM

## 2016-04-18 DIAGNOSIS — E1140 Type 2 diabetes mellitus with diabetic neuropathy, unspecified: Secondary | ICD-10-CM

## 2016-04-18 DIAGNOSIS — R4189 Other symptoms and signs involving cognitive functions and awareness: Secondary | ICD-10-CM | POA: Insufficient documentation

## 2016-04-18 DIAGNOSIS — I70213 Atherosclerosis of native arteries of extremities with intermittent claudication, bilateral legs: Secondary | ICD-10-CM

## 2016-04-18 LAB — CBC WITH DIFFERENTIAL/PLATELET
BASOS PCT: 0.4 % (ref 0.0–3.0)
Basophils Absolute: 0 10*3/uL (ref 0.0–0.1)
EOS ABS: 0.6 10*3/uL (ref 0.0–0.7)
Eosinophils Relative: 6.4 % — ABNORMAL HIGH (ref 0.0–5.0)
HEMATOCRIT: 37.9 % (ref 36.0–46.0)
Hemoglobin: 12.7 g/dL (ref 12.0–15.0)
LYMPHS PCT: 25.7 % (ref 12.0–46.0)
Lymphs Abs: 2.5 10*3/uL (ref 0.7–4.0)
MCHC: 33.6 g/dL (ref 30.0–36.0)
MCV: 86.5 fl (ref 78.0–100.0)
MONOS PCT: 9.9 % (ref 3.0–12.0)
Monocytes Absolute: 1 10*3/uL (ref 0.1–1.0)
NEUTROS ABS: 5.7 10*3/uL (ref 1.4–7.7)
Neutrophils Relative %: 57.6 % (ref 43.0–77.0)
PLATELETS: 408 10*3/uL — AB (ref 150.0–400.0)
RBC: 4.38 Mil/uL (ref 3.87–5.11)
RDW: 14.2 % (ref 11.5–15.5)
WBC: 9.8 10*3/uL (ref 4.0–10.5)

## 2016-04-18 LAB — COMPREHENSIVE METABOLIC PANEL
ALT: 22 U/L (ref 0–35)
AST: 23 U/L (ref 0–37)
Albumin: 4.2 g/dL (ref 3.5–5.2)
Alkaline Phosphatase: 80 U/L (ref 39–117)
BUN: 20 mg/dL (ref 6–23)
CALCIUM: 9.9 mg/dL (ref 8.4–10.5)
CHLORIDE: 103 meq/L (ref 96–112)
CO2: 29 meq/L (ref 19–32)
CREATININE: 0.87 mg/dL (ref 0.40–1.20)
GFR: 67.2 mL/min (ref >60.00)
Glucose, Bld: 84 mg/dL (ref 70–99)
Potassium: 4.3 mEq/L (ref 3.5–5.1)
Sodium: 141 mEq/L (ref 135–145)
Total Bilirubin: 0.9 mg/dL (ref 0.2–1.2)
Total Protein: 7.4 g/dL (ref 6.0–8.3)

## 2016-04-18 LAB — LDL CHOLESTEROL, DIRECT: LDL DIRECT: 94 mg/dL

## 2016-04-18 LAB — LIPID PANEL
CHOL/HDL RATIO: 4
CHOLESTEROL: 154 mg/dL (ref 0–200)
HDL: 36.3 mg/dL — ABNORMAL LOW (ref >39.00)
NONHDL: 117.42
Triglycerides: 202 mg/dL — ABNORMAL HIGH (ref 0.0–149.0)
VLDL: 40.4 mg/dL — AB (ref 0.0–40.0)

## 2016-04-18 LAB — URIC ACID: Uric Acid, Serum: 7.6 mg/dL — ABNORMAL HIGH (ref 2.4–7.0)

## 2016-04-18 LAB — HEMOGLOBIN A1C: Hgb A1c MFr Bld: 6.4 % (ref 4.6–6.5)

## 2016-04-18 LAB — TSH: TSH: 1.98 u[IU]/mL (ref 0.35–4.50)

## 2016-04-18 NOTE — Patient Instructions (Signed)
Hypertension Hypertension, commonly called high blood pressure, is when the force of blood pumping through your arteries is too strong. Your arteries are the blood vessels that carry blood from your heart throughout your body. A blood pressure reading consists of a higher number over a lower number, such as 110/72. The higher number (systolic) is the pressure inside your arteries when your heart pumps. The lower number (diastolic) is the pressure inside your arteries when your heart relaxes. Ideally you want your blood pressure below 120/80. Hypertension forces your heart to work harder to pump blood. Your arteries may become narrow or stiff. Having untreated or uncontrolled hypertension can cause heart attack, stroke, kidney disease, and other problems. What increases the risk? Some risk factors for high blood pressure are controllable. Others are not. Risk factors you cannot control include:  Race. You may be at higher risk if you are African American.  Age. Risk increases with age.  Gender. Men are at higher risk than women before age 45 years. After age 65, women are at higher risk than men. Risk factors you can control include:  Not getting enough exercise or physical activity.  Being overweight.  Getting too much fat, sugar, calories, or salt in your diet.  Drinking too much alcohol. What are the signs or symptoms? Hypertension does not usually cause signs or symptoms. Extremely high blood pressure (hypertensive crisis) may cause headache, anxiety, shortness of breath, and nosebleed. How is this diagnosed? To check if you have hypertension, your health care provider will measure your blood pressure while you are seated, with your arm held at the level of your heart. It should be measured at least twice using the same arm. Certain conditions can cause a difference in blood pressure between your right and left arms. A blood pressure reading that is higher than normal on one occasion does  not mean that you need treatment. If it is not clear whether you have high blood pressure, you may be asked to return on a different day to have your blood pressure checked again. Or, you may be asked to monitor your blood pressure at home for 1 or more weeks. How is this treated? Treating high blood pressure includes making lifestyle changes and possibly taking medicine. Living a healthy lifestyle can help lower high blood pressure. You may need to change some of your habits. Lifestyle changes may include:  Following the DASH diet. This diet is high in fruits, vegetables, and whole grains. It is low in salt, red meat, and added sugars.  Keep your sodium intake below 2,300 mg per day.  Getting at least 30-45 minutes of aerobic exercise at least 4 times per week.  Losing weight if necessary.  Not smoking.  Limiting alcoholic beverages.  Learning ways to reduce stress. Your health care provider may prescribe medicine if lifestyle changes are not enough to get your blood pressure under control, and if one of the following is true:  You are 18-59 years of age and your systolic blood pressure is above 140.  You are 60 years of age or older, and your systolic blood pressure is above 150.  Your diastolic blood pressure is above 90.  You have diabetes, and your systolic blood pressure is over 140 or your diastolic blood pressure is over 90.  You have kidney disease and your blood pressure is above 140/90.  You have heart disease and your blood pressure is above 140/90. Your personal target blood pressure may vary depending on your medical   conditions, your age, and other factors. Follow these instructions at home:  Have your blood pressure rechecked as directed by your health care provider.  Take medicines only as directed by your health care provider. Follow the directions carefully. Blood pressure medicines must be taken as prescribed. The medicine does not work as well when you skip  doses. Skipping doses also puts you at risk for problems.  Do not smoke.  Monitor your blood pressure at home as directed by your health care provider. Contact a health care provider if:  You think you are having a reaction to medicines taken.  You have recurrent headaches or feel dizzy.  You have swelling in your ankles.  You have trouble with your vision. Get help right away if:  You develop a severe headache or confusion.  You have unusual weakness, numbness, or feel faint.  You have severe chest or abdominal pain.  You vomit repeatedly.  You have trouble breathing. This information is not intended to replace advice given to you by your health care provider. Make sure you discuss any questions you have with your health care provider. Document Released: 05/08/2005 Document Revised: 10/14/2015 Document Reviewed: 02/28/2013 Elsevier Interactive Patient Education  2017 [employer] Inc.  

## 2016-04-18 NOTE — Progress Notes (Signed)
Subjective:  Patient ID: [name], female    DOB: [birth year]  Age: 75 y.o. MRN: [id number]  CC: Hypertension; Hyperlipidemia; and Diabetes   HPI [name] presents for follow-up on the above medical problems. Over the last few months she complains of frequent tripping and falling. She said she feels like her feet get tangled up and she will fall to the ground. She has had no significant injury from the falls. She also complains of declining memory. She is being treated for eczema by dermatologist. She tells me her blood pressure has been well controlled on hydrochlorothiazide, valsartan and propranolol. She denies any recent episodes of palpitations, fatigue, syncope, chest pain, shortness of breath, DOE, or edema.  Outpatient Medications Prior to Visit  Medication Sig Dispense Refill  . aspirin 81 MG EC tablet Take 1 tablet (81 mg total) by mouth daily. Swallow whole. 30 tablet 12  . hydrochlorothiazide (HYDRODIURIL) 25 MG tablet Take 1 tablet (25 mg total) by mouth daily. 90 tablet 1  . metFORMIN (GLUCOPHAGE) 500 MG tablet TAKE 1 TABLET(500 MG) BY MOUTH TWICE DAILY 180 tablet 1  . propranolol (INDERAL) 20 MG tablet Take 1 tablet (20 mg total) by mouth 2 (two) times daily. 180 tablet 3  . simvastatin (ZOCOR) 20 MG tablet TAKE 1 TABLET BY MOUTH EVERY DAY 90 tablet 2  . valsartan (DIOVAN) 160 MG tablet Take 1 tablet (160 mg total) by mouth daily. 90 tablet 3  . hydrochlorothiazide (HYDRODIURIL) 25 MG tablet TAKE 1 TABLET(25 MG) BY MOUTH DAILY 90 tablet 1  . predniSONE (DELTASONE) 10 MG tablet 3 tabs by mouth per day for 3 days,2tabs per day for 3 days,1tab per day for 3 days 18 tablet 0   No facility-administered medications prior to visit.     ROS Review of Systems  Constitutional: Negative for activity change, appetite change, chills, diaphoresis, fatigue, fever and unexpected weight change.  HENT: Negative.  Negative for trouble swallowing.   Eyes: Negative for visual  disturbance.  Respiratory: Negative for cough, choking, chest tightness, shortness of breath and stridor.   Cardiovascular: Negative for chest pain, palpitations and leg swelling.  Gastrointestinal: Negative.  Negative for abdominal pain, constipation, diarrhea and vomiting.  Endocrine: Negative.   Genitourinary: Negative.  Negative for difficulty urinating.  Musculoskeletal: Negative.  Negative for back pain, myalgias and neck pain.  Skin: Negative.   Allergic/Immunologic: Negative.   Neurological: Negative.   Hematological: Negative for adenopathy. Does not bruise/bleed easily.  Psychiatric/Behavioral: Positive for confusion and decreased concentration. Negative for sleep disturbance and suicidal ideas. The patient is not nervous/anxious.     Objective:  BP 124/84 (BP Location: Left Arm, Patient Position: Sitting, Cuff Size: Normal)   Pulse 96   Temp 98.4 F (36.9 C) (Oral)   Resp 16   Ht 5\' 2"  (1.575 m)   Wt 139 lb 1 oz (63.1 kg)   SpO2 96%   BMI 25.43 kg/m   BP Readings from Last 3 Encounters:  04/18/16 124/84  01/25/16 124/64  11/24/15 128/70    Wt Readings from Last 3 Encounters:  04/18/16 139 lb 1 oz (63.1 kg)  01/25/16 138 lb (62.6 kg)  11/24/15 132 lb (59.9 kg)    Physical Exam  Constitutional: She is oriented to person, place, and time. No distress.  HENT:  Mouth/Throat: Oropharynx is clear and moist. No oropharyngeal exudate.  Eyes: Conjunctivae are normal. Right eye exhibits no discharge. Left eye exhibits no discharge. No scleral icterus.  Neck:  Normal range of motion. Neck supple. No JVD present. No tracheal deviation present. No thyromegaly present.  Cardiovascular: Normal rate, regular rhythm, normal heart sounds and intact distal pulses.  Exam reveals no gallop and no friction rub.   No murmur heard. Pulmonary/Chest: Effort normal and breath sounds normal. No stridor. No respiratory distress. She has no wheezes. She has no rales. She exhibits no  tenderness.  Abdominal: Soft. Bowel sounds are normal. She exhibits no distension and no mass. There is no tenderness. There is no rebound and no guarding.  Musculoskeletal: Normal range of motion. She exhibits no edema, tenderness or deformity.  Lymphadenopathy:    She has no cervical adenopathy.  Neurological: She is oriented to person, place, and time.  Skin: Skin is warm and dry. No rash noted. She is not diaphoretic. No erythema. No pallor.  Psychiatric: She has a normal mood and affect. Her behavior is normal. Judgment and thought content normal.  Vitals reviewed.   Lab Results  Component Value Date   WBC 9.8 04/18/2016   HGB 12.7 04/18/2016   HCT 37.9 04/18/2016   PLT 408.0 (H) 04/18/2016   GLUCOSE 84 04/18/2016   CHOL 154 04/18/2016   TRIG 202.0 (H) 04/18/2016   HDL 36.30 (L) 04/18/2016   LDLDIRECT 94.0 04/18/2016   LDLCALC 62 04/21/2015   ALT 22 04/18/2016   AST 23 04/18/2016   NA 141 04/18/2016   K 4.3 04/18/2016   CL 103 04/18/2016   CREATININE 0.87 04/18/2016   BUN 20 04/18/2016   CO2 29 04/18/2016   TSH 1.98 04/18/2016   HGBA1C 6.4 04/18/2016   MICROALBUR 0.8 04/21/2015    No results found.  Assessment & Plan:   [name] was seen today for hypertension, hyperlipidemia and diabetes.  Diagnoses and all orders for this visit:  Atherosclerosis of native artery of both lower extremities with intermittent claudication ([place])- as below -     Lipid panel; Future  Bilateral carotid artery stenosis- she has had no complications related to this, we'll continue aggressive risk factor modifications. -     Lipid panel; Future  Essential hypertension- Her blood pressure is well controlled, electrolytes and renal function are stable. -     Comprehensive metabolic panel; Future -     CBC with Differential/Platelet; Future  Controlled type 2 diabetes mellitus with diabetic neuropathy, without long-term current use of insulin ([place])- her A1c is a 6.4%, blood sugars are  adequately well-controlled. -     Comprehensive metabolic panel; Future -     Hemoglobin A1c; Future  Idiopathic gout, unspecified chronicity, unspecified site- she has not achieved her uric acid goal of 6 but is also not recently had any gout attacks. She does not want to start a medication at this time to lower her uric acid level and prevent future gout attacks. -     Uric acid; Future  Dyslipidemia, goal LDL below 70- she has achieved her LDL goal and is doing well on the statin. -     Lipid panel; Future -     TSH; Future  Need for prophylactic vaccination and inoculation against influenza -     Flu vaccine HIGH DOSE PF (Fluzone High dose)  Cognitive decline -     Ambulatory referral to Neurology   I have discontinued Ms. Quinney's predniSONE. I am also having her maintain her valsartan, aspirin, propranolol, simvastatin, hydrochlorothiazide, and metFORMIN.  No orders of the defined types were placed in this encounter.    Follow-up: Return  in about 6 months (around 10/16/2016).  Scarlette Calico, MD

## 2016-04-18 NOTE — Progress Notes (Signed)
Pre visit review using our clinic review tool, if applicable. No additional management support is needed unless otherwise documented below in the visit note. 

## 2016-04-28 ENCOUNTER — Other Ambulatory Visit: Payer: Self-pay

## 2016-05-09 ENCOUNTER — Other Ambulatory Visit: Payer: Self-pay | Admitting: Internal Medicine

## 2016-05-23 ENCOUNTER — Other Ambulatory Visit: Payer: Self-pay | Admitting: Internal Medicine

## 2016-05-23 DIAGNOSIS — I10 Essential (primary) hypertension: Secondary | ICD-10-CM

## 2016-05-24 ENCOUNTER — Ambulatory Visit (INDEPENDENT_AMBULATORY_CARE_PROVIDER_SITE_OTHER): Payer: Medicare Other | Admitting: Neurology

## 2016-05-24 ENCOUNTER — Encounter: Payer: Self-pay | Admitting: Neurology

## 2016-05-24 VITALS — BP 154/84 | HR 68 | Resp 18 | Ht 62.0 in | Wt 137.0 lb

## 2016-05-24 DIAGNOSIS — R4189 Other symptoms and signs involving cognitive functions and awareness: Secondary | ICD-10-CM | POA: Diagnosis not present

## 2016-05-24 MED ORDER — ALPRAZOLAM 0.5 MG PO TABS: ORAL_TABLET | ORAL | 0 refills | Status: DC

## 2016-05-24 NOTE — Patient Instructions (Signed)
   We will get blood work today and get MRI of the brain. 

## 2016-05-24 NOTE — Progress Notes (Signed)
Reason for visit: Memory disturbance  Referring physician: Dr. Leotis Shames is a 76 y.o. female  History of present illness:  [name] is a 76 year old right-handed white female with a history of peripheral vascular disease, diabetes, dyslipidemia, and hypertension. The patient comes in with her family today. She has had about a year and a half history of some memory problems. The family indicates that the memory problems began following cervical spine surgery that was done around 12/24/2014. Following this, the patient developed some right arm weakness and discomfort, she had some mild balance changes that gradually improved. The patient underwent right carpal tunnel syndrome surgery as well, but she continues to have some numbness in the right hand. The family believes that the memory issue began at this time but has continued to progress. 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 The patient has some issues with short-term memory, she denies issues with names. She denies any problems keeping up with medications or appointments, she operates a motor vehicle without difficulty. The patient will misplace things about the house frequently. She does have some mild balance issues, but no recent falls. She denies any episodes of hypoglycemia she is on a cholesterol medication but she states that she does not take this frequently. The patient denies issues controlling the bowels or the bladder. At one point, the patient needed assistance in writing a check, she forgot how to perform this task. She is sent to this office for an evaluation.  Past Medical History:  Diagnosis Date  . Contact dermatitis and other eczema, due to unspecified cause   . Displacement of lumbar intervertebral disc without myelopathy   . FH: cataracts   . Gout, unspecified   . Occlusion and stenosis of carotid artery without mention  of cerebral infarction   . Other and unspecified hyperlipidemia   . Pain in joint, shoulder region   . Pain in limb   . Routine general medical examination at a health care facility   . Sciatica   . Type II or unspecified type diabetes mellitus without mention of complication, uncontrolled   . Unspecified essential hypertension   . Unspecified venous (peripheral) insufficiency     Past Surgical History:  Procedure Laterality Date  . ANTERIOR CERVICAL DECOMP/DISCECTOMY FUSION N/A 12/22/2014   Procedure: Anterior Cervical Four-Five/Five-Six/Six-Seven Decompression/Diskectomy/ and Fsion;  Surgeon: Leeroy Cha, MD;  Location: [facility];  Service: Neurosurgery;  Laterality: N/A;  C4-5 C5-6 C6-7 Anterior cervical decompression/diskectomy/fusion  . LAPAROSCOPIC APPENDECTOMY    . LUMBAR LAMINECTOMY  07/2008   [place]  . TUBAL LIGATION      Family History  Problem Relation Age of Onset  . Alzheimer's disease Mother   . Diabetes Mother   . Coronary artery disease Father     CABG  . Hypertension Father   . Cancer Brother   . Cancer Brother   . Colon cancer Neg Hx   . Breast cancer Neg Hx     Social history:  reports that she quit smoking about 21 years ago. She does not have any smokeless tobacco history on file. She reports that she does not drink alcohol or use drugs.  Medications:  Prior to Admission medications   Medication Sig Start Date End Date Taking? Authorizing Provider  aspirin 81 MG EC tablet Take 1 tablet (81 mg total) by mouth daily. Swallow whole. 04/21/15  Yes Marcello Moores  Evalina Field, MD  hydrochlorothiazide (HYDRODIURIL) 25 MG tablet TAKE 1 TABLET(25 MG) BY MOUTH DAILY 05/23/16  Yes Janith Lima, MD  metFORMIN (GLUCOPHAGE) 500 MG tablet TAKE 1 TABLET(500 MG) BY MOUTH TWICE DAILY 02/21/16  Yes Janith Lima, MD  propranolol (INDERAL) 20 MG tablet Take 1 tablet (20 mg total) by mouth 2 (two) times daily. 06/18/15  Yes Janith Lima, MD  simvastatin (ZOCOR) 20 MG tablet TAKE 1  TABLET BY MOUTH EVERY DAY 05/09/16  Yes Janith Lima, MD  valsartan (DIOVAN) 160 MG tablet Take 1 tablet (160 mg total) by mouth daily. Patient not taking: Reported on 05/24/2016 04/21/15   Janith Lima, MD      Allergies  Allergen Reactions  . Pneumococcal Vaccines Swelling    Per pt when received 2015  . Ampicillin Other (See Comments)    REACTION: causes urticaria  . Neomycin-Bacitracin Zn-Polymyx Rash  . Penicillins Other (See Comments)    REACTION: causes urticaria    ROS:  Out of a complete 14 system review of symptoms, the patient complains only of the following symptoms, and all other reviewed systems are negative.  Ringing in the ears Skin rash Memory disturbance  Blood pressure (!) 154/84, pulse 68, resp. rate 18, height 5\' 2"  (1.575 m), weight 137 lb (62.1 kg).  Physical Exam  General: The patient is alert and cooperative at the time of the examination.  Eyes: Pupils are anisocoric, the right pupil is 3 mm, left pupil is 5 mm, both are reactive to light. Discs are flat bilaterally. No ptosis is seen.  Neck: The neck is supple, no carotid bruits are noted.  Respiratory: The respiratory examination is clear.  Cardiovascular: The cardiovascular examination reveals a regular rate and rhythm, no obvious murmurs or rubs are noted.  Skin: Extremities are with 1+ edema at the ankles bilaterally..  Neurologic Exam  Mental status: The patient is alert and oriented x 3 at the time of the examination. The Mini-Mental Status Examination done today shows total score of 20/30. The patient is able to name 7 animals in 30 seconds.  Cranial nerves: Facial symmetry is present. There is good sensation of the face to pinprick and soft touch bilaterally. The strength of the facial muscles and the muscles to head turning and shoulder shrug are normal bilaterally. Speech is well enunciated, no aphasia or dysarthria is noted. Extraocular movements are full. Visual fields are full.  The tongue is midline, and the patient has symmetric elevation of the soft palate. No obvious hearing deficits are noted.  Motor: The motor testing reveals 5 over 5 strength of all 4 extremities. Good symmetric motor tone is noted throughout.  Sensory: Sensory testing is intact to pinprick, soft touch, vibration sensation, and position sense on all 4 extremities, with exception of decreased pinprick sensation on the right hand and left leg, decreased position sensation on the right foot. No evidence of extinction is noted.  Coordination: Cerebellar testing reveals good finger-nose-finger and heel-to-shin bilaterally.  Gait and station: Gait is normal. Tandem gait is normal. Romberg is negative. No drift is seen.  Reflexes: Deep tendon reflexes are symmetric and normal bilaterally, with exception of some depression of the ankle jerk reflexes bilaterally. Toes are downgoing bilaterally.   Assessment/Plan:  1. Memory disturbance  The patient does have risk factors for cerebrovascular disease. The patient will undergo MRI evaluation of the brain and blood work today. I have offered to initiate Aricept for memory, the patient does not  wish to start a medication at this time. She will follow-up in 6 months to follow the memory issues.  Jill Alexanders MD 05/24/2016 9:26 AM  [facility] [address] [place], [place] [address]  Phone [phone] Fax [phone]

## 2016-05-25 ENCOUNTER — Telehealth: Payer: Self-pay | Admitting: *Deleted

## 2016-05-25 LAB — RPR: RPR: NONREACTIVE

## 2016-05-25 LAB — VITAMIN B12: VITAMIN B 12: 609 pg/mL (ref 232–1245)

## 2016-05-25 NOTE — Telephone Encounter (Signed)
Called pt w/ unremarkable lab results. May call back w/ additional questions/concerns. 

## 2016-05-25 NOTE — Telephone Encounter (Signed)
-----   Message from Kathrynn Ducking, MD sent at 05/25/2016  7:56 AM EST -----   The blood work results are unremarkable. Please call the patient.  ----- Message ----- From: Lavone Neri Lab Results In Sent: 05/25/2016   7:41 AM To: Kathrynn Ducking, MD

## 2016-05-25 NOTE — Telephone Encounter (Signed)
Called patient to give lab results. She was not at home, will return in 2 hours.

## 2016-05-30 ENCOUNTER — Other Ambulatory Visit: Payer: Self-pay | Admitting: Internal Medicine

## 2016-05-30 DIAGNOSIS — I10 Essential (primary) hypertension: Secondary | ICD-10-CM

## 2016-08-18 ENCOUNTER — Other Ambulatory Visit: Payer: Self-pay | Admitting: Internal Medicine

## 2016-08-18 DIAGNOSIS — E1140 Type 2 diabetes mellitus with diabetic neuropathy, unspecified: Secondary | ICD-10-CM

## 2016-10-17 ENCOUNTER — Ambulatory Visit (INDEPENDENT_AMBULATORY_CARE_PROVIDER_SITE_OTHER): Payer: Medicare Other | Admitting: Internal Medicine

## 2016-10-17 ENCOUNTER — Encounter: Payer: Self-pay | Admitting: Internal Medicine

## 2016-10-17 ENCOUNTER — Other Ambulatory Visit (INDEPENDENT_AMBULATORY_CARE_PROVIDER_SITE_OTHER): Payer: Medicare Other

## 2016-10-17 VITALS — BP 136/62 | HR 64 | Temp 98.1°F | Resp 16 | Ht 62.0 in | Wt 145.0 lb

## 2016-10-17 DIAGNOSIS — I10 Essential (primary) hypertension: Secondary | ICD-10-CM

## 2016-10-17 DIAGNOSIS — Z0000 Encounter for general adult medical examination without abnormal findings: Secondary | ICD-10-CM

## 2016-10-17 DIAGNOSIS — I6529 Occlusion and stenosis of unspecified carotid artery: Secondary | ICD-10-CM

## 2016-10-17 DIAGNOSIS — E1140 Type 2 diabetes mellitus with diabetic neuropathy, unspecified: Secondary | ICD-10-CM | POA: Diagnosis not present

## 2016-10-17 DIAGNOSIS — I70213 Atherosclerosis of native arteries of extremities with intermittent claudication, bilateral legs: Secondary | ICD-10-CM

## 2016-10-17 DIAGNOSIS — E785 Hyperlipidemia, unspecified: Secondary | ICD-10-CM | POA: Diagnosis not present

## 2016-10-17 LAB — BASIC METABOLIC PANEL
BUN: 21 mg/dL (ref 6–23)
CHLORIDE: 101 meq/L (ref 96–112)
CO2: 30 mEq/L (ref 19–32)
CREATININE: 0.91 mg/dL (ref 0.40–1.20)
Calcium: 10.2 mg/dL (ref 8.4–10.5)
GFR: 63.72 mL/min (ref >60.00)
Glucose, Bld: 99 mg/dL (ref 70–99)
POTASSIUM: 4.3 meq/L (ref 3.5–5.1)
Sodium: 140 mEq/L (ref 135–145)

## 2016-10-17 LAB — LIPID PANEL
CHOL/HDL RATIO: 4
CHOLESTEROL: 148 mg/dL (ref 0–200)
HDL: 35.3 mg/dL — ABNORMAL LOW (ref >39.00)
NONHDL: 112.65
Triglycerides: 227 mg/dL — ABNORMAL HIGH (ref 0.0–149.0)
VLDL: 45.4 mg/dL — AB (ref 0.0–40.0)

## 2016-10-17 LAB — URINALYSIS, ROUTINE W REFLEX MICROSCOPIC
Bilirubin Urine: NEGATIVE
Hgb urine dipstick: NEGATIVE
KETONES UR: NEGATIVE
NITRITE: NEGATIVE
PH: 5 (ref 5.0–8.0)
Total Protein, Urine: NEGATIVE
Urine Glucose: NEGATIVE
Urobilinogen, UA: 0.2 (ref 0.0–1.0)

## 2016-10-17 LAB — MICROALBUMIN / CREATININE URINE RATIO
Creatinine,U: 42.9 mg/dL
MICROALB/CREAT RATIO: 1.6 mg/g (ref 0.0–30.0)

## 2016-10-17 LAB — LDL CHOLESTEROL, DIRECT: LDL DIRECT: 90 mg/dL

## 2016-10-17 LAB — HEMOGLOBIN A1C: HEMOGLOBIN A1C: 6.7 % — AB (ref 4.6–6.5)

## 2016-10-17 NOTE — Progress Notes (Addendum)
Subjective:   [name] is a 76 y.o. female who presents for Medicare Annual (Subsequent) preventive examination.  Review of Systems:  No ROS.  Medicare Wellness Visit.  Cardiac Risk Factors include: diabetes mellitus;advanced age (>1men, >61 women);dyslipidemia;hypertension Sleep patterns: no sleep issues, feels rested on waking, gets up 1 times nightly to void and sleeps 8-9 hours nightly.   Home Safety/Smoke Alarms:   Living environment; residence and Firearm Safety: 1-story house/ trailer, no firearms. Lives with husband, no needs for DME Seat Belt Safety/Bike Helmet: Wears seat belt.   Counseling:   Eye Exam- appointment yearly Dental- appointment every 6 months   Female:   Pap-  N/A     Mammo-   Last  12/27/11, negative, declined referral at this time    Dexa scan-  Last 01/13/13, osteopenia, declined referral at this time  CCS- Last 01/21/08, recall 10 years      Objective:     Vitals: BP 136/62   Pulse 64   Temp 98.1 F (36.7 C)   Resp 20   Ht 5\' 2"  (1.575 m)   Wt 145 lb (65.8 kg)   SpO2 98%   BMI 26.52 kg/m   Body mass index is 26.52 kg/m.   Tobacco History  Smoking Status  . Former Smoker  . Quit date: 05/23/1995  Smokeless Tobacco  . Never Used     Counseling given: Not Answered   Past Medical History:  Diagnosis Date  . Contact dermatitis and other eczema, due to unspecified cause   . Displacement of lumbar intervertebral disc without myelopathy   . FH: cataracts   . Gout, unspecified   . Occlusion and stenosis of carotid artery without mention of cerebral infarction   . Other and unspecified hyperlipidemia   . Pain in joint, shoulder region   . Pain in limb   . Routine general medical examination at a health care facility   . Sciatica   . Type II or unspecified type diabetes mellitus without mention of complication, uncontrolled   . Unspecified essential hypertension   . Unspecified venous (peripheral) insufficiency    Past Surgical  History:  Procedure Laterality Date  . ANTERIOR CERVICAL DECOMP/DISCECTOMY FUSION N/A 12/22/2014   Procedure: Anterior Cervical Four-Five/Five-Six/Six-Seven Decompression/Diskectomy/ and Fsion;  Surgeon: Leeroy Cha, MD;  Location: [facility];  Service: Neurosurgery;  Laterality: N/A;  C4-5 C5-6 C6-7 Anterior cervical decompression/diskectomy/fusion  . LAPAROSCOPIC APPENDECTOMY    . LUMBAR LAMINECTOMY  07/2008   [place]  . TUBAL LIGATION     Family History  Problem Relation Age of Onset  . Alzheimer's disease Mother   . Diabetes Mother   . Coronary artery disease Father        CABG  . Hypertension Father   . Cancer Brother   . Cancer Brother   . Colon cancer Neg Hx   . Breast cancer Neg Hx    History  Sexual Activity  . Sexual activity: Yes  . Partners: Male    Outpatient Encounter Prescriptions as of 10/17/2016  Medication Sig  . aspirin 81 MG EC tablet Take 1 tablet (81 mg total) by mouth daily. Swallow whole.  . hydrochlorothiazide (HYDRODIURIL) 25 MG tablet TAKE 1 TABLET(25 MG) BY MOUTH DAILY  . metFORMIN (GLUCOPHAGE) 500 MG tablet TAKE 1 TABLET(500 MG) BY MOUTH TWICE DAILY  . propranolol (INDERAL) 20 MG tablet TAKE 1 TABLET(20 MG) BY MOUTH TWICE DAILY  . simvastatin (ZOCOR) 20 MG tablet TAKE 1 TABLET BY MOUTH EVERY  DAY  . [DISCONTINUED] ALPRAZolam (XANAX) 0.5 MG tablet Take 2 tablets approximately 45 minutes prior to the MRI study, take a third tablet if needed. (Patient not taking: Reported on 10/17/2016)  . [DISCONTINUED] valsartan (DIOVAN) 160 MG tablet Take 1 tablet (160 mg total) by mouth daily. (Patient not taking: Reported on 10/17/2016)   No facility-administered encounter medications on file as of 10/17/2016.     Activities of Daily Living In your present state of health, do you have any difficulty performing the following activities: 10/17/2016  Hearing? N  Vision? N  Difficulty concentrating or making decisions? Y  Walking or climbing stairs? N  Dressing or  bathing? N  Doing errands, shopping? N  Preparing Food and eating ? N  Using the Toilet? N  In the past six months, have you accidently leaked urine? N  Do you have problems with loss of bowel control? N  Managing your Medications? N  Managing your Finances? N  Housekeeping or managing your Housekeeping? N  Some recent data might be hidden    Patient Care Team: Janith Lima, MD as PCP - General (Internal Medicine) Leeroy Cha, MD (Neurosurgery) Newton Pigg, MD (Obstetrics and Gynecology) Druscilla Brownie, MD (Dermatology) Marica Otter, OD (Optometry)    Assessment:    Physical assessment deferred to PCP.  Exercise Activities and Dietary recommendations Current Exercise Habits: Structured exercise class [facility] x3 weekly), Type of exercise: yoga;stretching;calisthenics, Time (Minutes): 55, Frequency (Times/Week): 3, Weekly Exercise (Minutes/Week): 165, Intensity: Mild, Exercise limited by: None identified  Diet (meal preparation, eat out, water intake, caffeinated beverages, dairy products, fruits and vegetables): in general, a "healthy" diet  , well balanced, diabetic, low fat/ cholesterol, low salt drinks 1 Dr. Malachi Bonds per day, drinks 1 glass of water daily. Encouraged patient to increase daily water intake.     Goals    . continue to exercise at the [facility]           I will try to go 3-5 times weekly.      Fall Risk Fall Risk  10/17/2016 04/28/2016 04/21/2015 03/31/2014 11/26/2013  Falls in the past year? Yes Yes No No No  Number falls in past yr: 2 or more 1 - - -  Injury with Fall? No No - - -  Follow up Education provided;Falls prevention discussed - - - -   Depression Screen PHQ 2/9 Scores 10/17/2016 04/21/2015 03/31/2014 11/26/2013  PHQ - 2 Score 0 0 0 0     Cognitive Function MMSE - Mini Mental State Exam 10/17/2016  Orientation to time 5  Orientation to Place 5  Registration 3  Attention/ Calculation 3  Recall 0  Language- name 2 objects  2  Language- repeat 1  Language- follow 3 step command 3  Language- read & follow direction 1  Write a sentence 1  Copy design 1  Total score 25        Immunization History  Administered Date(s) Administered  . Influenza, High Dose Seasonal PF 03/11/2013, 04/21/2015, 04/18/2016  . Influenza,inj,Quad PF,36+ Mos 03/31/2014  . Pneumococcal Conjugate-13 07/22/2013  . Td 08/14/2007   Screening Tests Health Maintenance  Topic Date Due  . URINE MICROALBUMIN  04/20/2016  . HEMOGLOBIN A1C  10/16/2016  . OPHTHALMOLOGY EXAM  10/20/2016  . FOOT EXAM  11/23/2016  . INFLUENZA VACCINE  12/20/2016  . TETANUS/TDAP  08/13/2017  . DEXA SCAN  Completed  . PNA vac Low Risk Adult  Excluded      Plan:  Continue to eat heart healthy diet (full of fruits, vegetables, whole grains, lean protein, water--limit salt, fat, and sugar intake) and increase physical activity as tolerated.  Start doing brain stimulating activities (puzzles, reading, adult coloring books, staying active) to keep memory sharp.    I have personally reviewed and noted the following in the patient's chart:   . Medical and social history . Use of alcohol, tobacco or illicit drugs  . Current medications and supplements . Functional ability and status . Nutritional status . Physical activity . Advanced directives . List of other physicians . Vitals . Screenings to include cognitive, depression, and falls . Referrals and appointments  In addition, I have reviewed and discussed with patient certain preventive protocols, quality metrics, and best practice recommendations. A written personalized care plan for preventive services as well as general preventive health recommendations were provided to patient.     Michiel Cowboy, RN  10/17/2016   Medical screening examination/treatment/procedure(s) were performed by non-physician practitioner and as supervising physician I was immediately available for  consultation/collaboration. I agree with above. Scarlette Calico, MD

## 2016-10-17 NOTE — Progress Notes (Signed)
Subjective:  Patient ID: [name], female    DOB: 02-14-1941  Age: 76 y.o. MRN: [id number]  CC: Medicare Wellness; Hypertension; Hyperlipidemia; and Diabetes   HPI [name] presents for f/up - She feels well and offers no complaints. She tells me her blood pressure has been well controlled and she's had no episodes of DOE, CP, SOB, edema, palpitations, or fatigue.  Outpatient Medications Prior to Visit  Medication Sig Dispense Refill  . aspirin 81 MG EC tablet Take 1 tablet (81 mg total) by mouth daily. Swallow whole. 30 tablet 12  . hydrochlorothiazide (HYDRODIURIL) 25 MG tablet TAKE 1 TABLET(25 MG) BY MOUTH DAILY 90 tablet 1  . metFORMIN (GLUCOPHAGE) 500 MG tablet TAKE 1 TABLET(500 MG) BY MOUTH TWICE DAILY 180 tablet 0  . propranolol (INDERAL) 20 MG tablet TAKE 1 TABLET(20 MG) BY MOUTH TWICE DAILY 180 tablet 1  . simvastatin (ZOCOR) 20 MG tablet TAKE 1 TABLET BY MOUTH EVERY DAY 90 tablet 1  . ALPRAZolam (XANAX) 0.5 MG tablet Take 2 tablets approximately 45 minutes prior to the MRI study, take a third tablet if needed. (Patient not taking: Reported on 10/17/2016) 3 tablet 0  . valsartan (DIOVAN) 160 MG tablet Take 1 tablet (160 mg total) by mouth daily. (Patient not taking: Reported on 10/17/2016) 90 tablet 3   No facility-administered medications prior to visit.     ROS Review of Systems  Constitutional: Negative for appetite change, chills, diaphoresis, fatigue and unexpected weight change.  HENT: Negative.  Negative for trouble swallowing.   Eyes: Negative for visual disturbance.  Respiratory: Negative.  Negative for cough, chest tightness, shortness of breath, wheezing and stridor.   Cardiovascular: Negative for chest pain, palpitations and leg swelling.  Gastrointestinal: Negative for abdominal pain, constipation, diarrhea, nausea and vomiting.  Endocrine: Negative for polydipsia, polyphagia and polyuria.  Genitourinary: Negative.  Negative for difficulty urinating and  dysuria.  Musculoskeletal: Negative.  Negative for back pain and myalgias.  Skin: Negative.   Allergic/Immunologic: Negative.   Neurological: Negative.  Negative for dizziness, weakness and light-headedness.  Hematological: Negative for adenopathy. Does not bruise/bleed easily.  Psychiatric/Behavioral: Negative.     Objective:  BP 136/62   Pulse 64   Temp 98.1 F (36.7 C)   Resp 16   Ht 5\' 2"  (1.575 m)   Wt 145 lb (65.8 kg)   SpO2 98%   BMI 26.52 kg/m   BP Readings from Last 3 Encounters:  10/17/16 136/62  05/24/16 (!) 154/84  04/18/16 124/84    Wt Readings from Last 3 Encounters:  10/17/16 145 lb (65.8 kg)  05/24/16 137 lb (62.1 kg)  04/18/16 139 lb 1 oz (63.1 kg)    Physical Exam  Constitutional: She is oriented to person, place, and time. No distress.  HENT:  Mouth/Throat: Oropharynx is clear and moist. No oropharyngeal exudate.  Eyes: Conjunctivae are normal. Right eye exhibits no discharge. Left eye exhibits no discharge. No scleral icterus.  Neck: Normal range of motion. Neck supple. No JVD present. No tracheal deviation present. No thyromegaly present.  Cardiovascular: Normal rate, regular rhythm, normal heart sounds and intact distal pulses.  Exam reveals no gallop and no friction rub.   No murmur heard. Pulmonary/Chest: Effort normal and breath sounds normal. No stridor. No respiratory distress. She has no wheezes. She has no rales. She exhibits no tenderness.  Abdominal: Soft. Bowel sounds are normal. She exhibits no distension and no mass. There is no tenderness. There is no rebound and  no guarding.  Musculoskeletal: Normal range of motion. She exhibits no edema, tenderness or deformity.  Lymphadenopathy:    She has no cervical adenopathy.  Neurological: She is oriented to person, place, and time.  Skin: Skin is warm and dry. No rash noted. She is not diaphoretic. No erythema. No pallor.  Vitals reviewed.   Lab Results  Component Value Date   WBC 9.8  04/18/2016   HGB 12.7 04/18/2016   HCT 37.9 04/18/2016   PLT 408.0 (H) 04/18/2016   GLUCOSE 99 10/17/2016   CHOL 148 10/17/2016   TRIG 227.0 (H) 10/17/2016   HDL 35.30 (L) 10/17/2016   LDLDIRECT 90.0 10/17/2016   LDLCALC 62 04/21/2015   ALT 22 04/18/2016   AST 23 04/18/2016   NA 140 10/17/2016   K 4.3 10/17/2016   CL 101 10/17/2016   CREATININE 0.91 10/17/2016   BUN 21 10/17/2016   CO2 30 10/17/2016   TSH 1.98 04/18/2016   HGBA1C 6.7 (H) 10/17/2016   MICROALBUR <0.7 10/17/2016    No results found.  Assessment & Plan:   Sera was seen today for medicare wellness, hypertension, hyperlipidemia and diabetes.  Diagnoses and all orders for this visit:  Essential hypertension- Her blood pressure is adequately well controlled, electrolytes and renal function are normal. -     Basic metabolic panel; Future -     Urinalysis, Routine w reflex microscopic; Future  Controlled type 2 diabetes mellitus with diabetic neuropathy, without long-term current use of insulin- her A1c is up to 6.7%, will continue metformin at the current dose. At this time I don't think it's prudent add another medication but I have encouraged her to improve her lifestyle modifications. ([place]) -     Hemoglobin A1c; Future -     Microalbumin / creatinine urine ratio; Future  Stenosis of carotid artery, unspecified laterality- will continue risk factor modification with statin therapy -     Lipid panel; Future  Atherosclerosis of native artery of both lower extremities with intermittent claudication ([place])- she's had no recent episodes of claudication, will continue risk factor modification with statin therapy and blood sugar control. -     Lipid panel; Future  Dyslipidemia, goal LDL below 70- she has achieved her LDL goal is doing well on the statin. -     Lipid panel; Future  Encounter for Medicare annual wellness exam   I have discontinued Ms. Breau's valsartan and ALPRAZolam. I am also having her maintain  her aspirin, simvastatin, hydrochlorothiazide, propranolol, and metFORMIN.  No orders of the defined types were placed in this encounter.    Follow-up: Return in about 6 months (around 04/19/2017).  Scarlette Calico, MD

## 2016-10-17 NOTE — Progress Notes (Signed)
Pre visit review using our clinic review tool, if applicable. No additional management support is needed unless otherwise documented below in the visit note. 

## 2016-10-17 NOTE — Patient Instructions (Signed)
Continue to eat heart healthy diet (full of fruits, vegetables, whole grains, lean protein, water--limit salt, fat, and sugar intake) and increase physical activity as tolerated.  Start doing brain stimulating activities (puzzles, reading, adult coloring books, staying active) to keep memory sharp.    [name] , Thank you for taking time to come for your Medicare Wellness Visit. I appreciate your ongoing commitment to your health goals. Please review the following plan we discussed and let me know if I can assist you in the future.   These are the goals we discussed: Goals    . continue to exercise at the [facility]           I will try to go 3-5 times weekly.       This is a list of the screening recommended for you and due dates:  Health Maintenance  Topic Date Due  . Urine Protein Check  04/20/2016  . Hemoglobin A1C  10/16/2016  . Eye exam for diabetics  10/20/2016  . Complete foot exam   11/23/2016  . Flu Shot  12/20/2016  . Tetanus Vaccine  08/13/2017  . DEXA scan (bone density measurement)  Completed  . Pneumonia vaccines  Excluded

## 2016-10-31 ENCOUNTER — Telehealth: Payer: Self-pay | Admitting: Internal Medicine

## 2016-10-31 NOTE — Telephone Encounter (Signed)
Pt called stating that she has a cough, sore throat and her right ear seems clogged / "does not feel right". She said that she is feeling somewhat better today than she did yesterday but still not great. She did not know if there was something that Dr Ronnald Ramp would recommend or could send in for her. She did not think that an appointment was necessary. Please advise.

## 2016-11-01 NOTE — Telephone Encounter (Signed)
yes

## 2016-11-01 NOTE — Telephone Encounter (Signed)
Pt has an appointment scheduled for tomorrow. She said that she went to the store this morning and got some over the counter medications. She said that if she is feeling better by tomorrow she may call and cancel the appointment.

## 2016-11-01 NOTE — Telephone Encounter (Signed)
noted 

## 2016-11-01 NOTE — Telephone Encounter (Signed)
Does pt need an appt?

## 2016-11-02 ENCOUNTER — Ambulatory Visit (INDEPENDENT_AMBULATORY_CARE_PROVIDER_SITE_OTHER): Payer: Medicare Other | Admitting: Internal Medicine

## 2016-11-02 VITALS — BP 144/72 | HR 90 | Temp 98.7°F | Resp 12 | Ht 62.0 in | Wt 141.0 lb

## 2016-11-02 DIAGNOSIS — J069 Acute upper respiratory infection, unspecified: Secondary | ICD-10-CM

## 2016-11-02 DIAGNOSIS — I70213 Atherosclerosis of native arteries of extremities with intermittent claudication, bilateral legs: Secondary | ICD-10-CM | POA: Diagnosis not present

## 2016-11-02 DIAGNOSIS — H6983 Other specified disorders of Eustachian tube, bilateral: Secondary | ICD-10-CM | POA: Diagnosis not present

## 2016-11-02 DIAGNOSIS — B9789 Other viral agents as the cause of diseases classified elsewhere: Secondary | ICD-10-CM | POA: Diagnosis not present

## 2016-11-02 DIAGNOSIS — B309 Viral conjunctivitis, unspecified: Secondary | ICD-10-CM | POA: Insufficient documentation

## 2016-11-02 DIAGNOSIS — H6981 Other specified disorders of Eustachian tube, right ear: Secondary | ICD-10-CM | POA: Insufficient documentation

## 2016-11-02 MED ORDER — METHYLPREDNISOLONE 4 MG PO TBPK: ORAL_TABLET | ORAL | 0 refills | Status: DC

## 2016-11-02 MED ORDER — HYDROCODONE-HOMATROPINE 5-1.5 MG/5ML PO SYRP: 5.0000 mL | ORAL_SOLUTION | Freq: Three times a day (TID) | ORAL | 0 refills | Status: DC | PRN

## 2016-11-02 MED ORDER — TOBRAMYCIN-DEXAMETHASONE 0.3-0.1 % OP SUSP: 1.0000 [drp] | Freq: Four times a day (QID) | OPHTHALMIC | 0 refills | Status: DC

## 2016-11-02 NOTE — Progress Notes (Signed)
Subjective:  Patient ID: [name], female    DOB: 1941-02-02  Age: 76 y.o. MRN: [id number]  CC: URI   HPI [name] presents for a 3 day history of nonproductive cough, red itchy eyes, earaches, muffled sensation in both ears, runny nose, and nasal congestion.  Outpatient Medications Prior to Visit  Medication Sig Dispense Refill  . aspirin 81 MG EC tablet Take 1 tablet (81 mg total) by mouth daily. Swallow whole. 30 tablet 12  . hydrochlorothiazide (HYDRODIURIL) 25 MG tablet TAKE 1 TABLET(25 MG) BY MOUTH DAILY 90 tablet 1  . metFORMIN (GLUCOPHAGE) 500 MG tablet TAKE 1 TABLET(500 MG) BY MOUTH TWICE DAILY 180 tablet 0  . propranolol (INDERAL) 20 MG tablet TAKE 1 TABLET(20 MG) BY MOUTH TWICE DAILY 180 tablet 1  . simvastatin (ZOCOR) 20 MG tablet TAKE 1 TABLET BY MOUTH EVERY DAY 90 tablet 1   No facility-administered medications prior to visit.     ROS Review of Systems  Constitutional: Negative.  Negative for chills, diaphoresis, fatigue, fever and unexpected weight change.  HENT: Positive for congestion, ear pain, postnasal drip and rhinorrhea. Negative for facial swelling, hearing loss, sinus pain, sinus pressure, sore throat, tinnitus, trouble swallowing and voice change.   Eyes: Negative.   Respiratory: Positive for cough. Negative for chest tightness, shortness of breath and wheezing.   Cardiovascular: Negative.  Negative for chest pain, palpitations and leg swelling.  Gastrointestinal: Negative for abdominal pain, constipation, diarrhea, nausea and vomiting.  Endocrine: Negative.   Genitourinary: Negative.   Musculoskeletal: Negative.  Negative for back pain and myalgias.  Skin: Negative.   Allergic/Immunologic: Negative.   Neurological: Negative.   Hematological: Negative for adenopathy. Does not bruise/bleed easily.  Psychiatric/Behavioral: Negative.     Objective:  BP (!) 144/72 (BP Location: Left Arm, Patient Position: Sitting, Cuff Size: Normal)   Pulse 90    Temp 98.7 F (37.1 C) (Oral)   Resp 12   Ht 5\' 2"  (1.575 m)   Wt 141 lb (64 kg)   SpO2 98%   BMI 25.79 kg/m   BP Readings from Last 3 Encounters:  11/02/16 (!) 144/72  10/17/16 136/62  05/24/16 (!) 154/84    Wt Readings from Last 3 Encounters:  11/02/16 141 lb (64 kg)  10/17/16 145 lb (65.8 kg)  05/24/16 137 lb (62.1 kg)    Physical Exam  Constitutional: She is oriented to person, place, and time. She does not have a sickly appearance. She does not appear ill. No distress.  HENT:  Right Ear: Hearing, tympanic membrane and external ear normal. No swelling. Tympanic membrane is not injected, not scarred, not perforated, not erythematous, not retracted and not bulging. Right ear middle ear effusion: serous effusion.  Left Ear: Hearing, external ear and ear canal normal. No swelling or tenderness. Tympanic membrane is injected. Tympanic membrane is not scarred, not perforated, not erythematous, not retracted and not bulging.  No middle ear effusion.  Nose: No mucosal edema, rhinorrhea or sinus tenderness. Right sinus exhibits no maxillary sinus tenderness and no frontal sinus tenderness. Left sinus exhibits no maxillary sinus tenderness and no frontal sinus tenderness.  Mouth/Throat: Oropharynx is clear and moist and mucous membranes are normal. Mucous membranes are not pale, not dry and not cyanotic. No oral lesions. No trismus in the jaw. No uvula swelling. No oropharyngeal exudate, posterior oropharyngeal edema, posterior oropharyngeal erythema or tonsillar abscesses.  Eyes: Right eye exhibits no chemosis and no discharge. Left eye exhibits no chemosis and  no discharge. Right conjunctiva is injected. Right conjunctiva has no hemorrhage. Left conjunctiva is injected. Left conjunctiva has no hemorrhage. No scleral icterus.    Neck: Normal range of motion. Neck supple. No JVD present. No thyromegaly present.  Cardiovascular: Normal rate, regular rhythm and intact distal pulses.  Exam  reveals no gallop.   No murmur heard. Pulmonary/Chest: Effort normal and breath sounds normal. No respiratory distress. She has no wheezes. She has no rales. She exhibits no tenderness.  Abdominal: Soft. Bowel sounds are normal. She exhibits no distension and no mass. There is no tenderness. There is no rebound and no guarding.  Musculoskeletal: Normal range of motion. She exhibits no edema, tenderness or deformity.  Lymphadenopathy:       Head (right side): No submental, no posterior auricular and no occipital adenopathy present.       Head (left side): No submandibular, no posterior auricular and no occipital adenopathy present.    She has no cervical adenopathy.    She has no axillary adenopathy.       Right: No supraclavicular adenopathy present.       Left: No supraclavicular adenopathy present.  Neurological: She is alert and oriented to person, place, and time.  Skin: Skin is warm and dry. No rash noted. She is not diaphoretic. No erythema. No pallor.  Vitals reviewed.   Lab Results  Component Value Date   WBC 9.8 04/18/2016   HGB 12.7 04/18/2016   HCT 37.9 04/18/2016   PLT 408.0 (H) 04/18/2016   GLUCOSE 99 10/17/2016   CHOL 148 10/17/2016   TRIG 227.0 (H) 10/17/2016   HDL 35.30 (L) 10/17/2016   LDLDIRECT 90.0 10/17/2016   LDLCALC 62 04/21/2015   ALT 22 04/18/2016   AST 23 04/18/2016   NA 140 10/17/2016   K 4.3 10/17/2016   CL 101 10/17/2016   CREATININE 0.91 10/17/2016   BUN 21 10/17/2016   CO2 30 10/17/2016   TSH 1.98 04/18/2016   HGBA1C 6.7 (H) 10/17/2016   MICROALBUR <0.7 10/17/2016    No results found.  Assessment & Plan:   [name] was seen today for uri.  Diagnoses and all orders for this visit:  Viral URI with cough -     HYDROcodone-homatropine (HYCODAN) 5-1.5 MG/5ML syrup; Take 5 mLs by mouth every 8 (eight) hours as needed for cough.  Eustachian tube dysfunction, bilateral -     methylPREDNISolone (MEDROL DOSEPAK) 4 MG TBPK tablet; TAKE AS  DIRECTED  Acute viral conjunctivitis of both eyes -     tobramycin-dexamethasone (TOBRADEX) ophthalmic solution; Place 1 drop into both eyes every 6 (six) hours.   I am having [name] start on HYDROcodone-homatropine, methylPREDNISolone, and tobramycin-dexamethasone. I am also having her maintain her aspirin, simvastatin, hydrochlorothiazide, propranolol, and metFORMIN.  Meds ordered this encounter  Medications  . HYDROcodone-homatropine (HYCODAN) 5-1.5 MG/5ML syrup    Sig: Take 5 mLs by mouth every 8 (eight) hours as needed for cough.    Dispense:  120 mL    Refill:  0  . methylPREDNISolone (MEDROL DOSEPAK) 4 MG TBPK tablet    Sig: TAKE AS DIRECTED    Dispense:  21 tablet    Refill:  0  . tobramycin-dexamethasone (TOBRADEX) ophthalmic solution    Sig: Place 1 drop into both eyes every 6 (six) hours.    Dispense:  5 mL    Refill:  0     Follow-up: No Follow-up on file.  Scarlette Calico, MD

## 2016-11-02 NOTE — Patient Instructions (Signed)
Upper Respiratory Infection, Adult Most upper respiratory infections (URIs) are caused by a virus. A URI affects the nose, throat, and upper air passages. The most common type of URI is often called "the common cold." Follow these instructions at home:  Take medicines only as told by your doctor.  Gargle warm saltwater or take cough drops to comfort your throat as told by your doctor.  Use a warm mist humidifier or inhale steam from a shower to increase air moisture. This may make it easier to breathe.  Drink enough fluid to keep your pee (urine) clear or pale yellow.  Eat soups and other clear broths.  Have a healthy diet.  Rest as needed.  Go back to work when your fever is gone or your doctor says it is okay. ? You may need to stay home longer to avoid giving your URI to others. ? You can also wear a face mask and wash your hands often to prevent spread of the virus.  Use your inhaler more if you have asthma.  Do not use any tobacco products, including cigarettes, chewing tobacco, or electronic cigarettes. If you need help quitting, ask your doctor. Contact a doctor if:  You are getting worse, not better.  Your symptoms are not helped by medicine.  You have chills.  You are getting more short of breath.  You have brown or red mucus.  You have yellow or brown discharge from your nose.  You have pain in your face, especially when you bend forward.  You have a fever.  You have puffy (swollen) neck glands.  You have pain while swallowing.  You have white areas in the back of your throat. Get help right away if:  You have very bad or constant: ? Headache. ? Ear pain. ? Pain in your forehead, behind your eyes, and over your cheekbones (sinus pain). ? Chest pain.  You have long-lasting (chronic) lung disease and any of the following: ? Wheezing. ? Long-lasting cough. ? Coughing up blood. ? A change in your usual mucus.  You have a stiff neck.  You have  changes in your: ? Vision. ? Hearing. ? Thinking. ? Mood. This information is not intended to replace advice given to you by your health care provider. Make sure you discuss any questions you have with your health care provider. Document Released: 10/25/2007 Document Revised: 01/09/2016 Document Reviewed: 08/13/2013 Elsevier Interactive Patient Education  2018 [employer]  

## 2016-11-03 ENCOUNTER — Encounter: Payer: Self-pay | Admitting: Internal Medicine

## 2016-11-13 ENCOUNTER — Encounter: Payer: Self-pay | Admitting: Internal Medicine

## 2016-11-13 ENCOUNTER — Ambulatory Visit (INDEPENDENT_AMBULATORY_CARE_PROVIDER_SITE_OTHER): Payer: Medicare Other | Admitting: Internal Medicine

## 2016-11-13 VITALS — BP 142/76 | HR 89 | Temp 98.8°F | Resp 16 | Ht 62.0 in | Wt 142.8 lb

## 2016-11-13 DIAGNOSIS — I70213 Atherosclerosis of native arteries of extremities with intermittent claudication, bilateral legs: Secondary | ICD-10-CM

## 2016-11-13 DIAGNOSIS — H6981 Other specified disorders of Eustachian tube, right ear: Secondary | ICD-10-CM

## 2016-11-13 MED ORDER — METHYLPREDNISOLONE 4 MG PO TBPK: ORAL_TABLET | ORAL | 0 refills | Status: DC

## 2016-11-13 MED ORDER — LEVOCETIRIZINE DIHYDROCHLORIDE 5 MG PO TABS: 5.0000 mg | ORAL_TABLET | Freq: Every evening | ORAL | 1 refills | Status: DC

## 2016-11-13 NOTE — Patient Instructions (Signed)
Barotitis Media Barotitis media is inflammation of the middle ear. This condition occurs when an auditory tube (eustachian tube) is blocked in one or both ears. These tubes lead from the middle ear to the back of the nose (nasopharynx). This condition typically occurs when you experience changes in pressure, such as when flying or scuba diving. Untreated barotitis media may lead to damage or hearing loss (barotrauma), which may become permanent. What are the causes? This condition may be caused by changes in air pressure from:  Flying.  Scuba diving.  A nearby explosion. What increases the risk? The following factors may make you more likely to develop this condition:  Middle ear infection.  Sinus infection.  A cold.  Environmental allergies.  Small eustachian tubes.  Recent ear surgery. What are the signs or symptoms? Symptoms of this condition may include:  Ear pain.  Hearing loss. In severe cases, symptoms can include:  Dizziness and nausea (vertigo).  Temporary facial paralysis. How is this diagnosed? This condition is diagnosed based on:  A physical exam. Your health care provider may:  Use a device (otoscope) to look into your ear canal and check your eardrum.  Do a test that changes air pressure in the middle ear to check how well the eardrum moves and to see if the eustachian tube is working(tympanogram).  Your medical history. In some cases, your health care provider may have you take a hearing test. You may also be referred to someone who specializes in ear treatment (otolaryngologist, "ENT"). How is this treated? This condition may be treated with:  Medicines to relieve congestion in your nose, sinus, or upper respiratory tract (decongestants).  Techniques to equalize pressure (to "pop" your ears), such as:  Yawning.  Chewing gum.  Swallowing. In severe cases, you may need surgery to relieve your symptoms or to prevent future inflammation. Follow  these instructions at home:  Take over-the-counter and prescription medicines only as told by your health care provider.  Do not put anything into your ears to clean or unplug them. Ear drops will not help.  Keep all follow-up visits as told by your health care provider. This is important. How is this prevented? Using these strategies may help to prevent barotitis media:  Chewing gum with frequent, forceful swallowing during takeoff and landing when flying.  Holding your nose and gently blowing to pop your ears for equalizing pressure changes. This forces air into the eustachian tube.  Yawning during air pressure changes.  Using a nasal decongestant about 30-60 minutes before flying, if you have nasal congestion. Contact a health care provider if:  You have vertigo.  You have hearing loss.  Your symptoms do not get better or they get worse.  You have a fever. Get help right away if:  You have a severe headache, ear pain, and dizziness.  You have balance problems.  You cannot move or feel part of your face.  You have bloody or pus-like drainage from your ears. Summary  Barotitis media is inflammation of the middle ear.  This condition typically occurs when you experience changes in pressure, such as when flying or scuba diving.  You may be at a higher risk for this condition if you have small eustachian tubes, had recent ear surgery, or have allergies, a cold, or sinus or middle ear infection.  This condition may be treated with medicines or techniques to equalize pressure in your ears.  Strategies can be used to help prevent barotitis media. This information is   not intended to replace advice given to you by your health care provider. Make sure you discuss any questions you have with your health care provider. Document Released: 05/05/2000 Document Revised: 03/27/2016 Document Reviewed: 03/27/2016 Elsevier Interactive Patient Education  2017 [employer]  

## 2016-11-13 NOTE — Progress Notes (Signed)
Subjective:  Patient ID: [name], female    DOB: Dec 10, 1940  Age: 76 y.o. MRN: [id number]  CC: Ear Fullness   HPI [name] presents for a several day history of popping and pressure in her right ear. She was treated here for URI about 10 days ago. She was getting some symptom relief with a Medrol Dosepak but she finished it a few days ago and then the symptoms returned.  Outpatient Medications Prior to Visit  Medication Sig Dispense Refill  . aspirin 81 MG EC tablet Take 1 tablet (81 mg total) by mouth daily. Swallow whole. 30 tablet 12  . hydrochlorothiazide (HYDRODIURIL) 25 MG tablet TAKE 1 TABLET(25 MG) BY MOUTH DAILY 90 tablet 1  . metFORMIN (GLUCOPHAGE) 500 MG tablet TAKE 1 TABLET(500 MG) BY MOUTH TWICE DAILY 180 tablet 0  . propranolol (INDERAL) 20 MG tablet TAKE 1 TABLET(20 MG) BY MOUTH TWICE DAILY 180 tablet 1  . simvastatin (ZOCOR) 20 MG tablet TAKE 1 TABLET BY MOUTH EVERY DAY 90 tablet 1  . HYDROcodone-homatropine (HYCODAN) 5-1.5 MG/5ML syrup Take 5 mLs by mouth every 8 (eight) hours as needed for cough. 120 mL 0  . methylPREDNISolone (MEDROL DOSEPAK) 4 MG TBPK tablet TAKE AS DIRECTED 21 tablet 0  . tobramycin-dexamethasone (TOBRADEX) ophthalmic solution Place 1 drop into both eyes every 6 (six) hours. 5 mL 0   No facility-administered medications prior to visit.     ROS Review of Systems  Constitutional: Negative.  Negative for chills, fatigue and fever.  HENT: Positive for ear pain. Negative for congestion, ear discharge, facial swelling, hearing loss, postnasal drip, sinus pain, sinus pressure, sneezing, sore throat, tinnitus and trouble swallowing.   Eyes: Negative.   Respiratory: Negative.  Negative for choking, shortness of breath and wheezing.   Cardiovascular: Negative.  Negative for chest pain, palpitations and leg swelling.  Gastrointestinal: Negative for abdominal pain, constipation, diarrhea, nausea and vomiting.  Genitourinary: Negative.     Musculoskeletal: Negative.  Negative for back pain.  Skin: Negative.  Negative for color change and rash.  Neurological: Negative.  Negative for dizziness and headaches.  Hematological: Negative for adenopathy. Does not bruise/bleed easily.  Psychiatric/Behavioral: Negative.     Objective:  BP (!) 142/76 (BP Location: Left Arm, Patient Position: Sitting, Cuff Size: Normal)   Pulse 89   Temp 98.8 F (37.1 C) (Oral)   Resp 16   Ht 5\' 2"  (1.575 m)   Wt 142 lb 12 oz (64.8 kg)   SpO2 98%   BMI 26.11 kg/m   BP Readings from Last 3 Encounters:  11/13/16 (!) 142/76  11/02/16 (!) 144/72  10/17/16 136/62    Wt Readings from Last 3 Encounters:  11/13/16 142 lb 12 oz (64.8 kg)  11/02/16 141 lb (64 kg)  10/17/16 145 lb (65.8 kg)    Physical Exam  Constitutional: She is oriented to person, place, and time. No distress.  HENT:  Right Ear: Hearing, external ear and ear canal normal. No lacerations. No drainage, swelling or tenderness. No foreign bodies. No mastoid tenderness. Tympanic membrane is retracted. Tympanic membrane is not injected, not scarred, not perforated, not erythematous and not bulging. Tympanic membrane mobility is normal. No middle ear effusion. No hemotympanum. No decreased hearing is noted.  Left Ear: Hearing, tympanic membrane, external ear and ear canal normal.  Nose: No mucosal edema or rhinorrhea. Right sinus exhibits no maxillary sinus tenderness and no frontal sinus tenderness. Left sinus exhibits no maxillary sinus tenderness and no  frontal sinus tenderness.  Mouth/Throat: Oropharynx is clear and moist. No oropharyngeal exudate.  Eyes: Conjunctivae are normal. Right eye exhibits no discharge. Left eye exhibits no discharge. No scleral icterus.  Neck: Normal range of motion. Neck supple. No JVD present. No thyromegaly present.  Cardiovascular: Normal rate, regular rhythm and intact distal pulses.  Exam reveals no gallop and no friction rub.   No murmur  heard. Pulmonary/Chest: Effort normal and breath sounds normal. No respiratory distress. She has no wheezes. She has no rales. She exhibits no tenderness.  Abdominal: Soft. Bowel sounds are normal. She exhibits no distension and no mass. There is no tenderness. There is no rebound and no guarding.  Musculoskeletal: Normal range of motion. She exhibits no edema, tenderness or deformity.  Lymphadenopathy:    She has no cervical adenopathy.  Neurological: She is alert and oriented to person, place, and time.  Skin: Skin is warm and dry. No rash noted. She is not diaphoretic. No erythema. No pallor.  Vitals reviewed.   Lab Results  Component Value Date   WBC 9.8 04/18/2016   HGB 12.7 04/18/2016   HCT 37.9 04/18/2016   PLT 408.0 (H) 04/18/2016   GLUCOSE 99 10/17/2016   CHOL 148 10/17/2016   TRIG 227.0 (H) 10/17/2016   HDL 35.30 (L) 10/17/2016   LDLDIRECT 90.0 10/17/2016   LDLCALC 62 04/21/2015   ALT 22 04/18/2016   AST 23 04/18/2016   NA 140 10/17/2016   K 4.3 10/17/2016   CL 101 10/17/2016   CREATININE 0.91 10/17/2016   BUN 21 10/17/2016   CO2 30 10/17/2016   TSH 1.98 04/18/2016   HGBA1C 6.7 (H) 10/17/2016   MICROALBUR <0.7 10/17/2016    No results found.  Assessment & Plan:   [name] was seen today for ear fullness.  Diagnoses and all orders for this visit:  Eustachian tube dysfunction, right- she is not willing to spray anything in her nose to treat this, will try another round of systemic steroids and will start an antihistamine. -     levocetirizine (XYZAL) 5 MG tablet; Take 1 tablet (5 mg total) by mouth every evening. -     methylPREDNISolone (MEDROL DOSEPAK) 4 MG TBPK tablet; TAKE AS DIRECTED   I have discontinued [name]'s HYDROcodone-homatropine, methylPREDNISolone, and tobramycin-dexamethasone. I am also having her start on levocetirizine and methylPREDNISolone. Additionally, I am having her maintain her aspirin, simvastatin, hydrochlorothiazide, propranolol,  and metFORMIN.  Meds ordered this encounter  Medications  . levocetirizine (XYZAL) 5 MG tablet    Sig: Take 1 tablet (5 mg total) by mouth every evening.    Dispense:  90 tablet    Refill:  1  . methylPREDNISolone (MEDROL DOSEPAK) 4 MG TBPK tablet    Sig: TAKE AS DIRECTED    Dispense:  21 tablet    Refill:  0     Follow-up: No Follow-up on file.  Scarlette Calico, MD

## 2016-11-14 ENCOUNTER — Other Ambulatory Visit: Payer: Self-pay | Admitting: Internal Medicine

## 2016-11-14 DIAGNOSIS — E1140 Type 2 diabetes mellitus with diabetic neuropathy, unspecified: Secondary | ICD-10-CM

## 2016-11-14 DIAGNOSIS — I10 Essential (primary) hypertension: Secondary | ICD-10-CM

## 2016-11-27 ENCOUNTER — Encounter: Payer: Self-pay | Admitting: *Deleted

## 2016-11-27 ENCOUNTER — Ambulatory Visit (INDEPENDENT_AMBULATORY_CARE_PROVIDER_SITE_OTHER): Payer: Medicare Other | Admitting: Adult Health

## 2016-11-27 VITALS — BP 146/76 | HR 73

## 2016-11-27 DIAGNOSIS — R413 Other amnesia: Secondary | ICD-10-CM | POA: Diagnosis not present

## 2016-11-27 DIAGNOSIS — I70213 Atherosclerosis of native arteries of extremities with intermittent claudication, bilateral legs: Secondary | ICD-10-CM

## 2016-11-27 NOTE — Patient Instructions (Signed)
Your Plan:  We will continue to monitor memory Please call [facility] (on Wednesday) if they haven't called you to schedule MRI: [phone] Consider Aricept for memory  Thank you for coming to see [place] at [facility]. I hope we have been able to provide you high quality care today.  You may receive a patient satisfaction survey over the next few weeks. We would appreciate your feedback and comments so that we may continue to improve ourselves and the health of our patients.   Donepezil tablets What is this medicine? DONEPEZIL (doe NEP e zil) is used to treat mild to moderate dementia caused by Alzheimer's disease. This medicine may be used for other purposes; ask your health care provider or pharmacist if you have questions. COMMON BRAND NAME(S): Aricept What should I tell my health care provider before I take this medicine? They need to know if you have any of these conditions: -asthma or other lung disease -difficulty passing urine -head injury -heart disease -history of irregular heartbeat -liver disease -seizures (convulsions) -stomach or intestinal disease, ulcers or stomach bleeding -an unusual or allergic reaction to donepezil, other medicines, foods, dyes, or preservatives -pregnant or trying to get pregnant -breast-feeding How should I use this medicine? Take this medicine by mouth with a glass of water. Follow the directions on the prescription label. You may take this medicine with or without food. Take this medicine at regular intervals. This medicine is usually taken before bedtime. Do not take it more often than directed. Continue to take your medicine even if you feel better. Do not stop taking except on your doctor's advice. If you are taking the 23 mg donepezil tablet, swallow it whole; do not cut, crush, or chew it. Talk to your pediatrician regarding the use of this medicine in children. Special care may be needed. Overdosage: If you  think you have taken too much of this medicine contact a poison control center or emergency room at once. NOTE: This medicine is only for you. Do not share this medicine with others. What if I miss a dose? If you miss a dose, take it as soon as you can. If it is almost time for your next dose, take only that dose, do not take double or extra doses. What may interact with this medicine? Do not take this medicine with any of the following medications: -certain medicines for fungal infections like itraconazole, fluconazole, posaconazole, and voriconazole -cisapride -dextromethorphan; quinidine -dofetilide -dronedarone -pimozide -quinidine -thioridazine -ziprasidone This medicine may also interact with the following medications: -antihistamines for allergy, cough and cold -atropine -bethanechol -carbamazepine -certain medicines for bladder problems like oxybutynin, tolterodine -certain medicines for Parkinson's disease like benztropine, trihexyphenidyl -certain medicines for stomach problems like dicyclomine, hyoscyamine -certain medicines for travel sickness like scopolamine -dexamethasone -ipratropium -NSAIDs, medicines for pain and inflammation, like ibuprofen or naproxen -other medicines for Alzheimer's disease -other medicines that prolong the QT interval (cause an abnormal heart rhythm) -phenobarbital -phenytoin -rifampin, rifabutin or rifapentine This list may not describe all possible interactions. Give your health care provider a list of all the medicines, herbs, non-prescription drugs, or dietary supplements you use. Also tell them if you smoke, drink alcohol, or use illegal drugs. Some items may interact with your medicine. What should I watch for while using this medicine? Visit your doctor or health care professional for regular checks on your progress. Check with your doctor or health care professional if your symptoms do not get better or if they get worse. You  may get  drowsy or dizzy. Do not drive, use machinery, or do anything that needs mental alertness until you know how this drug affects you. What side effects may I notice from receiving this medicine? Side effects that you should report to your doctor or health care professional as soon as possible: -allergic reactions like skin rash, itching or hives, swelling of the face, lips, or tongue -feeling faint or lightheaded, falls -loss of bladder control -seizures -signs and symptoms of a dangerous change in heartbeat or heart rhythm like chest pain; dizziness; fast or irregular heartbeat; palpitations; feeling faint or lightheaded, falls; breathing problems -signs and symptoms of infection like fever or chills; cough; sore throat; pain or trouble passing urine -signs and symptoms of liver injury like dark yellow or brown urine; general ill feeling or flu-like symptoms; light-colored stools; loss of appetite; nausea; right upper belly pain; unusually weak or tired; yellowing of the eyes or skin -slow heartbeat or palpitations -unusual bleeding or bruising -vomiting Side effects that usually do not require medical attention (report to your doctor or health care professional if they continue or are bothersome): -diarrhea, especially when starting treatment -headache -loss of appetite -muscle cramps -nausea -stomach upset This list may not describe all possible side effects. Call your doctor for medical advice about side effects. You may report side effects to FDA at 1-800-FDA-1088. Where should I keep my medicine? Keep out of reach of children. Store at room temperature between 15 and 30 degrees C (59 and 86 degrees F). Throw away any unused medicine after the expiration date. NOTE: This sheet is a summary. It may not cover all possible information. If you have questions about this medicine, talk to your doctor, pharmacist, or health care provider.  2018 Elsevier/Gold Standard (2015-10-25  21:00:42)

## 2016-11-27 NOTE — Progress Notes (Signed)
I have read the note, and I agree with the clinical assessment and plan.  Seidy Labreck KEITH   

## 2016-11-27 NOTE — Progress Notes (Signed)
PATIENT: [name] DOB: 02-Aug-1940  REASON FOR VISIT: follow up- memory HISTORY FROM:  Patient, Daughter and Husband  HISTORY OF PRESENT ILLNESS: 11/27/16  [name]  is a 76 year old female with a history of memory disturbance. She returns today for follow-up. The patient states that she did not get the MRI as ordered by Dr. Jannifer Franklin. However she is interested in doing that today. The patient feels that her memory has gotten slightly worse. She was at home with her husband. She is able to complete all ADLs independently. She does not operate a motor vehicle. She continues to do cooking in the home without difficulty. Her husband does the finances together. She denies any trouble sleeping. Denies hallucinations. Her daughter states that she sometimes will mix up appointments. She also can't remember conversations that she may have recently had. Her husband and daughter also reports that she is very repetitive asking questions continuously. The family still reports that the symptoms but only started after her next visit. She returns today for an evaluation  HISTORY 05/24/16: [name] is a 76 year old right-handed white female with a history of peripheral vascular disease, diabetes, dyslipidemia, and hypertension. The patient comes in with her family today. She has had about a year and a half history of some memory problems. The family indicates that the memory problems began following cervical spine surgery that was done around 12/24/2014. Following this, the patient developed some right arm weakness and discomfort, she had some mild balance changes that gradually improved. The patient underwent right carpal tunnel syndrome surgery as well, but she continues to have some numbness in the right hand. The family believes that the memory issue began at this time but has continued to progress. 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 The patient has some issues with short-term memory, she denies issues with names. She denies any problems keeping up with medications or appointments, she operates a motor vehicle without difficulty. The patient will misplace things about the house frequently. She does have some mild balance issues, but no recent falls. She denies any episodes of hypoglycemia she is on a cholesterol medication but she states that she does not take this frequently. The patient denies issues controlling the bowels or the bladder. At one point, the patient needed assistance in writing a check, she forgot how to perform this task. She is sent to this office for an evaluation.   REVIEW OF SYSTEMS: Out of a complete 14 system review of symptoms, the patient complains only of the following symptoms, and all other reviewed systems are negative.  Eye itching    ALLERGIES: Allergies  Allergen Reactions  . Pneumococcal Vaccines Swelling    Per pt when received 2015  . Ampicillin Other (See Comments)    REACTION: causes urticaria  . Neomycin-Bacitracin Zn-Polymyx Rash  . Penicillins Other (See Comments)    REACTION: causes urticaria    HOME MEDICATIONS: Outpatient Medications Prior to Visit  Medication Sig Dispense Refill  . aspirin 81 MG EC tablet Take 1 tablet (81 mg total) by mouth daily. Swallow whole. 30 tablet 12  . hydrochlorothiazide (HYDRODIURIL) 25 MG tablet TAKE 1 TABLET(25 MG) BY MOUTH DAILY 90 tablet 0  . levocetirizine (XYZAL) 5 MG tablet Take 1 tablet (5 mg total) by mouth every evening. 90 tablet 1  . metFORMIN (GLUCOPHAGE) 500 MG tablet TAKE 1 TABLET(500 MG) BY MOUTH TWICE DAILY  180 tablet 0  . propranolol (INDERAL) 20 MG tablet TAKE 1 TABLET(20 MG) BY MOUTH TWICE DAILY 180 tablet 1  . simvastatin (ZOCOR) 20 MG tablet TAKE 1 TABLET BY MOUTH EVERY DAY 90 tablet 1  . hydrochlorothiazide (HYDRODIURIL) 25 MG tablet TAKE 1 TABLET(25 MG) BY MOUTH DAILY 90 tablet 1  .  methylPREDNISolone (MEDROL DOSEPAK) 4 MG TBPK tablet TAKE AS DIRECTED 21 tablet 0   No facility-administered medications prior to visit.     PAST MEDICAL HISTORY: Past Medical History:  Diagnosis Date  . Contact dermatitis and other eczema, due to unspecified cause   . Displacement of lumbar intervertebral disc without myelopathy   . FH: cataracts   . Gout, unspecified   . Occlusion and stenosis of carotid artery without mention of cerebral infarction   . Other and unspecified hyperlipidemia   . Pain in joint, shoulder region   . Pain in limb   . Routine general medical examination at a health care facility   . Sciatica   . Type II or unspecified type diabetes mellitus without mention of complication, uncontrolled   . Unspecified essential hypertension   . Unspecified venous (peripheral) insufficiency     PAST SURGICAL HISTORY: Past Surgical History:  Procedure Laterality Date  . ANTERIOR CERVICAL DECOMP/DISCECTOMY FUSION N/A 12/22/2014   Procedure: Anterior Cervical Four-Five/Five-Six/Six-Seven Decompression/Diskectomy/ and Fsion;  Surgeon: Leeroy Cha, MD;  Location: [facility];  Service: Neurosurgery;  Laterality: N/A;  C4-5 C5-6 C6-7 Anterior cervical decompression/diskectomy/fusion  . LAPAROSCOPIC APPENDECTOMY    . LUMBAR LAMINECTOMY  07/2008   [place]  . TUBAL LIGATION      FAMILY HISTORY: Family History  Problem Relation Age of Onset  . Alzheimer's disease Mother   . Diabetes Mother   . Coronary artery disease Father        CABG  . Hypertension Father   . Cancer Brother   . Cancer Brother   . Colon cancer Neg Hx   . Breast cancer Neg Hx     SOCIAL HISTORY: Social History   Social History  . Marital status: Married    Spouse name: Laverna Peace  . Number of children: 3  . Years of education: 12   Occupational History  . Not on file.   Social History Main Topics  . Smoking status: Former Smoker    Quit date: 05/23/1995  . Smokeless tobacco: Never Used  .  Alcohol use No  . Drug use: No  . Sexual activity: Yes    Partners: Male   Other Topics Concern  . Not on file   Social History Narrative   HSG.  Married '59.  2 sons - '65, '62; 1 dtr '61; 7 grandchildren; 3 great-grandchildren.  Lives w/husband in own home.  END OF LIFE: she does not want to be kept alive on machines. Needs more information in regard to CPR, etc. Provided w/[employer] and MOST (7/09)      PHYSICAL EXAM  Vitals:   11/27/16 0845  BP: (!) 146/76  Pulse: 73   There is no height or weight on file to calculate BMI.   MMSE - Mini Mental State Exam 11/27/2016   Orientation to time 4   Orientation to Place 4   Registration 3   Attention/ Calculation 0   Recall 0   Language- name 2 objects 2   Language- repeat 1   Language- follow 3 step command 3   Language- read & follow direction 1   Write a sentence  0   Copy design 1   Total score 19      Generalized: Well developed, in no acute distress   Neurological examination  Mentation: Alert. Follows all commands speech and language fluent Cranial nerve II-XII: Pupils were equal round reactive to light. Extraocular movements were full, visual field were full on confrontational test. Facial sensation and strength were normal. Uvula tongue midline. Head turning and shoulder shrug  were normal and symmetric. Motor: The motor testing reveals 5 over 5 strength of all 4 extremities. Good symmetric motor tone is noted throughout.  Sensory: Sensory testing is intact to soft touch on all 4 extremities. No evidence of extinction is noted.  Coordination: Cerebellar testing reveals good finger-nose-finger and heel-to-shin bilaterally.  Gait and station: Gait is normal. Romberg is negative. No drift is seen.  Reflexes: Deep tendon reflexes are symmetric and normal bilaterally.   DIAGNOSTIC DATA (LABS, IMAGING, TESTING) - I reviewed patient records, labs, notes, testing and imaging myself where available.  Lab Results    Component Value Date   WBC 9.8 04/18/2016   HGB 12.7 04/18/2016   HCT 37.9 04/18/2016   MCV 86.5 04/18/2016   PLT 408.0 (H) 04/18/2016      Component Value Date/Time   NA 140 10/17/2016 0940   K 4.3 10/17/2016 0940   CL 101 10/17/2016 0940   CO2 30 10/17/2016 0940   GLUCOSE 99 10/17/2016 0940   BUN 21 10/17/2016 0940   CREATININE 0.91 10/17/2016 0940   CALCIUM 10.2 10/17/2016 0940   PROT 7.4 04/18/2016 0840   ALBUMIN 4.2 04/18/2016 0840   AST 23 04/18/2016 0840   ALT 22 04/18/2016 0840   ALKPHOS 80 04/18/2016 0840   BILITOT 0.9 04/18/2016 0840   GFRNONAA 59 (L) 12/24/2014 0450   GFRAA >60 12/24/2014 0450   Lab Results  Component Value Date   CHOL 148 10/17/2016   HDL 35.30 (L) 10/17/2016   LDLCALC 62 04/21/2015   LDLDIRECT 90.0 10/17/2016   TRIG 227.0 (H) 10/17/2016   CHOLHDL 4 10/17/2016   Lab Results  Component Value Date   HGBA1C 6.7 (H) 10/17/2016   Lab Results  Component Value Date   VITAMINB12 609 05/24/2016   Lab Results  Component Value Date   TSH 1.98 04/18/2016      ASSESSMENT AND PLAN 76 y.o. year old female  has a past medical history of Contact dermatitis and other eczema, due to unspecified cause; Displacement of lumbar intervertebral disc without myelopathy; FH: cataracts; Gout, unspecified; Occlusion and stenosis of carotid artery without mention of cerebral infarction; Other and unspecified hyperlipidemia; Pain in joint, shoulder region; Pain in limb; Routine general medical examination at a health care facility; Sciatica; Type II or unspecified type diabetes mellitus without mention of complication, uncontrolled; Unspecified essential hypertension; and Unspecified venous (peripheral) insufficiency. here with:  1. Memory disturbance  The patient's memory score has remained relatively the same. She saw Dr. Jannifer Franklin in January. Her score today is 19 out of 30 was previously 20 out of 30. We discussed memory medication however the patient would  like to have her MRI first. I advised patient that she can call [facility] imaging to schedule this. She is advised that if her symptoms worsen or she develops new symptoms she should let us know. She will plan to follow-up in 6 months or sooner if needed.  I spent 25 minutes with the patient. 50% of this time was spent discussing her memory score, diagnosis and treatment options.  Ward Givens, MSN, NP-C 11/27/2016, 8:58 AM [facility] [address] [place], [place] [address] [phone]

## 2016-12-15 ENCOUNTER — Ambulatory Visit
Admission: RE | Admit: 2016-12-15 | Discharge: 2016-12-15 | Disposition: A | Discharge status: Home / Self Care | Payer: Medicare Other | Source: Ambulatory Visit | Attending: Neurology | Admitting: Neurology

## 2016-12-15 DIAGNOSIS — R4189 Other symptoms and signs involving cognitive functions and awareness: Secondary | ICD-10-CM | POA: Diagnosis not present

## 2016-12-15 DIAGNOSIS — R413 Other amnesia: Secondary | ICD-10-CM | POA: Diagnosis not present

## 2016-12-18 ENCOUNTER — Telehealth: Payer: Self-pay | Admitting: Neurology

## 2016-12-18 NOTE — Telephone Encounter (Signed)
I called patient. MRI the brain does show some small vessel disease that has progressed from 2013. The patient does have hypertension and diabetes. She is on low-dose aspirin.  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   MRI brain 12/18/16:   IMPRESSION:  This MRI of the brain without contrast shows the following: 1.    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 This has significantly progressed when compared to the MRI dated 12/14/2011. 2.    Chronic microvascular ischemic change, I'll progress we'll compared to the 12/14/2011 MRI. 3.    There are no acute findings.

## 2017-02-11 ENCOUNTER — Other Ambulatory Visit: Payer: Self-pay | Admitting: Internal Medicine

## 2017-02-11 DIAGNOSIS — E1140 Type 2 diabetes mellitus with diabetic neuropathy, unspecified: Secondary | ICD-10-CM

## 2017-02-11 DIAGNOSIS — I10 Essential (primary) hypertension: Secondary | ICD-10-CM

## 2017-03-12 ENCOUNTER — Other Ambulatory Visit: Payer: Self-pay | Admitting: Internal Medicine

## 2017-03-12 ENCOUNTER — Ambulatory Visit (INDEPENDENT_AMBULATORY_CARE_PROVIDER_SITE_OTHER): Payer: Medicare Other

## 2017-03-12 DIAGNOSIS — Z23 Encounter for immunization: Secondary | ICD-10-CM | POA: Diagnosis not present

## 2017-03-21 ENCOUNTER — Ambulatory Visit (INDEPENDENT_AMBULATORY_CARE_PROVIDER_SITE_OTHER): Payer: Medicare Other | Admitting: Internal Medicine

## 2017-03-21 ENCOUNTER — Other Ambulatory Visit (INDEPENDENT_AMBULATORY_CARE_PROVIDER_SITE_OTHER): Payer: Medicare Other

## 2017-03-21 ENCOUNTER — Encounter: Payer: Self-pay | Admitting: Internal Medicine

## 2017-03-21 VITALS — BP 174/94 | HR 74 | Temp 98.1°F | Resp 16 | Ht 62.0 in | Wt 143.2 lb

## 2017-03-21 DIAGNOSIS — D473 Essential (hemorrhagic) thrombocythemia: Secondary | ICD-10-CM | POA: Diagnosis not present

## 2017-03-21 DIAGNOSIS — I10 Essential (primary) hypertension: Secondary | ICD-10-CM

## 2017-03-21 DIAGNOSIS — I70213 Atherosclerosis of native arteries of extremities with intermittent claudication, bilateral legs: Secondary | ICD-10-CM

## 2017-03-21 DIAGNOSIS — E1140 Type 2 diabetes mellitus with diabetic neuropathy, unspecified: Secondary | ICD-10-CM | POA: Diagnosis not present

## 2017-03-21 DIAGNOSIS — F0390 Unspecified dementia without behavioral disturbance: Secondary | ICD-10-CM | POA: Insufficient documentation

## 2017-03-21 DIAGNOSIS — G629 Polyneuropathy, unspecified: Secondary | ICD-10-CM

## 2017-03-21 DIAGNOSIS — D75839 Thrombocytosis, unspecified: Secondary | ICD-10-CM | POA: Insufficient documentation

## 2017-03-21 LAB — CBC WITH DIFFERENTIAL/PLATELET
Basophils Absolute: 0.1 10*3/uL (ref 0.0–0.1)
Basophils Relative: 0.9 % (ref 0.0–3.0)
EOS PCT: 12.5 % — AB (ref 0.0–5.0)
Eosinophils Absolute: 1 10*3/uL — ABNORMAL HIGH (ref 0.0–0.7)
HCT: 40.8 % (ref 36.0–46.0)
Hemoglobin: 13.4 g/dL (ref 12.0–15.0)
LYMPHS ABS: 2.3 10*3/uL (ref 0.7–4.0)
Lymphocytes Relative: 29.3 % (ref 12.0–46.0)
MCHC: 32.9 g/dL (ref 30.0–36.0)
MCV: 88.9 fl (ref 78.0–100.0)
MONOS PCT: 9.2 % (ref 3.0–12.0)
Monocytes Absolute: 0.7 10*3/uL (ref 0.1–1.0)
NEUTROS PCT: 48.1 % (ref 43.0–77.0)
Neutro Abs: 3.8 10*3/uL (ref 1.4–7.7)
Platelets: 409 10*3/uL — ABNORMAL HIGH (ref 150.0–400.0)
RBC: 4.59 Mil/uL (ref 3.87–5.11)
RDW: 13.5 % (ref 11.5–15.5)
WBC: 7.8 10*3/uL (ref 4.0–10.5)

## 2017-03-21 LAB — BASIC METABOLIC PANEL
BUN: 27 mg/dL — ABNORMAL HIGH (ref 6–23)
CHLORIDE: 101 meq/L (ref 96–112)
CO2: 30 meq/L (ref 19–32)
CREATININE: 1 mg/dL (ref 0.40–1.20)
Calcium: 10.2 mg/dL (ref 8.4–10.5)
GFR: 57.08 mL/min — ABNORMAL LOW (ref >60.00)
Glucose, Bld: 105 mg/dL — ABNORMAL HIGH (ref 70–99)
POTASSIUM: 3.9 meq/L (ref 3.5–5.1)
SODIUM: 140 meq/L (ref 135–145)

## 2017-03-21 LAB — IBC PANEL
IRON: 59 ug/dL (ref 42–145)
SATURATION RATIOS: 14.7 % — AB (ref 20.0–50.0)
TRANSFERRIN: 286 mg/dL (ref 212.0–360.0)

## 2017-03-21 LAB — VITAMIN B12: Vitamin B-12: 620 pg/mL (ref 211–911)

## 2017-03-21 LAB — FOLATE

## 2017-03-21 LAB — HEMOGLOBIN A1C: HEMOGLOBIN A1C: 6.8 % — AB (ref 4.6–6.5)

## 2017-03-21 LAB — FERRITIN: FERRITIN: 33.1 ng/mL (ref 10.0–291.0)

## 2017-03-21 MED ORDER — IRBESARTAN 150 MG PO TABS: 150.0000 mg | ORAL_TABLET | Freq: Every day | ORAL | 1 refills | Status: DC

## 2017-03-21 NOTE — Progress Notes (Signed)
Subjective:  Patient ID: [name], female    DOB: 04-07-1941  Age: 76 y.o. MRN: [id number]  CC: Hypertension and Diabetes   HPI [name] presents for f/up -she complains of worsening forgetfulness and does not know if she is taking her medications properly.  She does not monitor her blood pressure or her blood sugar.  She has had no recent episodes of headache/blurred vision/chest pain/shortness of breath/or polyuria.  Outpatient Medications Prior to Visit  Medication Sig Dispense Refill  . metFORMIN (GLUCOPHAGE) 500 MG tablet TAKE 1 TABLET(500 MG) BY MOUTH TWICE DAILY 180 tablet 0  . aspirin 81 MG EC tablet Take 1 tablet (81 mg total) by mouth daily. Swallow whole. (Patient not taking: Reported on 03/21/2017) 30 tablet 12  . levocetirizine (XYZAL) 5 MG tablet Take 1 tablet (5 mg total) by mouth every evening. 90 tablet 1  . hydrochlorothiazide (HYDRODIURIL) 25 MG tablet TAKE 1 TABLET(25 MG) BY MOUTH DAILY 90 tablet 0  . propranolol (INDERAL) 20 MG tablet TAKE 1 TABLET(20 MG) BY MOUTH TWICE DAILY 180 tablet 1  . simvastatin (ZOCOR) 20 MG tablet TAKE 1 TABLET BY MOUTH EVERY DAY 90 tablet 0   No facility-administered medications prior to visit.     ROS Review of Systems  Constitutional: Negative.  Negative for appetite change, diaphoresis, fatigue and unexpected weight change.  HENT: Negative.  Negative for trouble swallowing.   Eyes: Negative.  Negative for visual disturbance.  Respiratory: Negative.  Negative for cough, chest tightness, shortness of breath and wheezing.   Cardiovascular: Negative for chest pain, palpitations and leg swelling.  Gastrointestinal: Negative for abdominal pain, constipation, diarrhea, nausea and vomiting.  Endocrine: Negative.  Negative for polydipsia, polyphagia and polyuria.  Genitourinary: Negative.  Negative for difficulty urinating.  Musculoskeletal: Negative.  Negative for arthralgias and myalgias.  Skin: Negative.   Allergic/Immunologic:  Negative.   Neurological: Negative.  Negative for dizziness, weakness, light-headedness and headaches.  Hematological: Negative for adenopathy. Does not bruise/bleed easily.  Psychiatric/Behavioral: Positive for confusion and decreased concentration. Negative for behavioral problems, hallucinations, self-injury and suicidal ideas. The patient is not nervous/anxious and is not hyperactive.     Objective:  BP (!) 174/94 (BP Location: Left Arm, Patient Position: Sitting, Cuff Size: Normal)   Pulse 74   Temp 98.1 F (36.7 C) (Oral)   Resp 16   Ht 5\' 2"  (1.575 m)   Wt 143 lb 4 oz (65 kg)   SpO2 96%   BMI 26.20 kg/m   BP Readings from Last 3 Encounters:  03/21/17 (!) 174/94  11/27/16 (!) 146/76  11/13/16 (!) 142/76    Wt Readings from Last 3 Encounters:  03/21/17 143 lb 4 oz (65 kg)  11/13/16 142 lb 12 oz (64.8 kg)  11/02/16 141 lb (64 kg)    Physical Exam  Constitutional: No distress.  HENT:  Mouth/Throat: Oropharynx is clear and moist. No oropharyngeal exudate.  Eyes: Conjunctivae are normal. Right eye exhibits no discharge. Left eye exhibits no discharge. No scleral icterus.  Neck: Normal range of motion. Neck supple. No JVD present. No thyromegaly present.  Cardiovascular: Normal rate, regular rhythm and intact distal pulses.  Exam reveals no gallop and no friction rub.   No murmur heard. Pulmonary/Chest: Effort normal and breath sounds normal. No respiratory distress. She has no wheezes. She has no rales. She exhibits no tenderness.  Abdominal: Soft. Bowel sounds are normal. She exhibits no distension and no mass. There is no tenderness. There is no  rebound and no guarding.  Musculoskeletal: Normal range of motion. She exhibits no edema, tenderness or deformity.  Lymphadenopathy:    She has no cervical adenopathy.  Neurological: She is alert. She has normal reflexes.  Skin: Skin is warm and dry. No rash noted. She is not diaphoretic. No erythema. No pallor.  Psychiatric:  She has a normal mood and affect. Her behavior is normal. Judgment and thought content normal.  Vitals reviewed.   Lab Results  Component Value Date   WBC 7.8 03/21/2017   HGB 13.4 03/21/2017   HCT 40.8 03/21/2017   PLT 409.0 (H) 03/21/2017   GLUCOSE 105 (H) 03/21/2017   CHOL 148 10/17/2016   TRIG 227.0 (H) 10/17/2016   HDL 35.30 (L) 10/17/2016   LDLDIRECT 90.0 10/17/2016   LDLCALC 62 04/21/2015   ALT 22 04/18/2016   AST 23 04/18/2016   NA 140 03/21/2017   K 3.9 03/21/2017   CL 101 03/21/2017   CREATININE 1.00 03/21/2017   BUN 27 (H) 03/21/2017   CO2 30 03/21/2017   TSH 1.98 04/18/2016   HGBA1C 6.8 (H) 03/21/2017   MICROALBUR <0.7 10/17/2016    Mr Brain Wo Contrast  Result Date: 12/18/2016  [facility] [address] [place], [place] [address] [phone] NEUROIMAGING REPORT STUDY DATE: 12/15/2016 PATIENT NAME: [name] DOB: [birth year] MRN: [id number] EXAM: MRI Brain without contrast ORDERING CLINICIAN: Kathrynn Ducking M.D. CLINICAL HISTORY: 76 year old woman with memory loss COMPARISON FILMS: 12/14/2011 TECHNIQUE: MRI of the brain without contrast was obtained utilizing 5 mm axial slices with T1, T2, T2 flair, SWI and diffusion weighted views.  T1 sagittal and T2 coronal views were obtained. CONTRAST: none IMAGING SITE: [facility], [place], [place] FINDINGS: On sagittal images, the spinal cord is imaged caudally to C4 and is normal in caliber. Metal artifact, probably from ACDF,  is noted at C4.  The contents of the posterior fossa are of normal size and position.   The pituitary gland and optic chiasm appear normal.    There is moderate cortical atrophy most pronounced in the mesial temporal lobes. The atrophy has significantly progressed rest when compared to the 12/14/2011 MRI.  The ventricles are proportionately enlarged.  There are no abnormal extra-axial collections of fluid.  The cerebellum and brainstem appears normal.   The  deep gray matter appears normal.  The hemispheres, there are T2/FLAIR hyperintense foci predominantly in the periventricular and deep white matter distant with chronic microvessel ischemic change. When compared to the MRI of the brain dated 12/14/2011, this has progressed..  Diffusion weighted images are normal.  Susceptibility weighted images are normal.  The orbits appear normal.   The VIIth/VIIIth nerve complex appears normal.  The mastoid air cells appear normal.  The paranasal sinuses appear normal.  Flow voids are identified within the major intracerebral arteries.      This MRI of the brain without contrast shows the following: 1.    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 This has significantly progressed when compared to the MRI dated 12/14/2011. 2.    Chronic microvascular ischemic change, I'll progress we'll compared to the 12/14/2011 MRI. 3.    There are no acute findings. INTERPRETING PHYSICIAN: Richard A. Felecia Shelling, MD, PhD, FAAN Certified in  Neuroimaging by [employer] [facility]:   Lakeyta was seen today for hypertension and diabetes.  Diagnoses and all orders for this visit:  Controlled type  2 diabetes mellitus with diabetic neuropathy, without long-term current use of insulin ([place])- her A1c is at 6.8%.  Will continue metformin at the current dose.  Will start an ARB for renal protection. -     Basic metabolic panel; Future -     Hemoglobin A1c; Future -     Discontinue: irbesartan (AVAPRO) 150 MG tablet; Take 1 tablet (150 mg total) by mouth daily. -     Ambulatory referral to Ophthalmology -     metFORMIN (GLUCOPHAGE) 500 MG tablet; Take 1 tablet (500 mg total) by mouth 2 (two) times daily with a meal. -     irbesartan (AVAPRO) 150 MG tablet; Take 1 tablet (150 mg total) by mouth daily.  Essential hypertension- her blood pressure is not adequately well controlled.  I have asked her to let a home health care nurse  come see if she is being compliant with her medications.  Will add on an ARB and will continue propranolol and hydrochlorothiazide at the current doses. -     Basic metabolic panel; Future -     Discontinue: irbesartan (AVAPRO) 150 MG tablet; Take 1 tablet (150 mg total) by mouth daily. -     Ambulatory referral to [place] -     propranolol (INDERAL) 20 MG tablet; Take 1 tablet (20 mg total) by mouth 2 (two) times daily. -     irbesartan (AVAPRO) 150 MG tablet; Take 1 tablet (150 mg total) by mouth daily. -     hydrochlorothiazide (HYDRODIURIL) 25 MG tablet; Take 1 tablet (25 mg total) by mouth daily.  Thrombocytosis (HCC)-this appears to be stable and benign. -     CBC with Differential/Platelet; Future -     IBC panel; Future -     Ferritin; Future  Peripheral polyneuropathy  Senile dementia without behavioral disturbance- lab work is negative for any secondary causes.  Signs and symptoms are consistent with age-related dementia.  I have asked her to let a home healthcare nurse, help her with her medical compliance. -     Folate; Future -     Vitamin B12; Future -     Ambulatory referral to [place]  Other orders -     simvastatin (ZOCOR) 20 MG tablet; Take 1 tablet (20 mg total) by mouth daily.   I have changed Ms. Thissen's simvastatin, propranolol, metFORMIN, and hydrochlorothiazide. I am also having her maintain her aspirin, levocetirizine, and irbesartan.  Meds ordered this encounter  Medications  . DISCONTD: irbesartan (AVAPRO) 150 MG tablet    Sig: Take 1 tablet (150 mg total) by mouth daily.    Dispense:  90 tablet    Refill:  1  . simvastatin (ZOCOR) 20 MG tablet    Sig: Take 1 tablet (20 mg total) by mouth daily.    Dispense:  90 tablet    Refill:  0  . propranolol (INDERAL) 20 MG tablet    Sig: Take 1 tablet (20 mg total) by mouth 2 (two) times daily.    Dispense:  180 tablet    Refill:  0  . metFORMIN (GLUCOPHAGE) 500 MG tablet    Sig: Take 1 tablet (500 mg  total) by mouth 2 (two) times daily with a meal.    Dispense:  180 tablet    Refill:  0  . irbesartan (AVAPRO) 150 MG tablet    Sig: Take 1 tablet (150 mg total) by mouth daily.    Dispense:  90 tablet    Refill:  0  . hydrochlorothiazide (HYDRODIURIL) 25 MG tablet    Sig: Take 1 tablet (25 mg total) by mouth daily.    Dispense:  90 tablet    Refill:  0     Follow-up: Return in about 4 months (around 07/19/2017).  Scarlette Calico, MD

## 2017-03-21 NOTE — Patient Instructions (Signed)

## 2017-03-22 ENCOUNTER — Telehealth: Payer: Self-pay | Admitting: Internal Medicine

## 2017-03-22 MED ORDER — PROPRANOLOL HCL 20 MG PO TABS: 20.0000 mg | ORAL_TABLET | Freq: Two times a day (BID) | ORAL | 0 refills | Status: DC

## 2017-03-22 MED ORDER — METFORMIN HCL 500 MG PO TABS: 500.0000 mg | ORAL_TABLET | Freq: Two times a day (BID) | ORAL | 0 refills | Status: DC

## 2017-03-22 MED ORDER — IRBESARTAN 150 MG PO TABS: 150.0000 mg | ORAL_TABLET | Freq: Every day | ORAL | 0 refills | Status: DC

## 2017-03-22 MED ORDER — SIMVASTATIN 20 MG PO TABS: 20.0000 mg | ORAL_TABLET | Freq: Every day | ORAL | 0 refills | Status: DC

## 2017-03-22 MED ORDER — HYDROCHLOROTHIAZIDE 25 MG PO TABS: 25.0000 mg | ORAL_TABLET | Freq: Every day | ORAL | 0 refills | Status: DC

## 2017-03-22 NOTE — Telephone Encounter (Signed)
yes

## 2017-03-22 NOTE — Telephone Encounter (Signed)
Called pt and informed of MD response.

## 2017-03-22 NOTE — Telephone Encounter (Signed)
Patient would like to know if she is still to take the propranol along with irbesartan?

## 2017-03-27 ENCOUNTER — Telehealth: Payer: Self-pay | Admitting: Internal Medicine

## 2017-03-27 DIAGNOSIS — F0390 Unspecified dementia without behavioral disturbance: Secondary | ICD-10-CM | POA: Diagnosis not present

## 2017-03-27 DIAGNOSIS — E1142 Type 2 diabetes mellitus with diabetic polyneuropathy: Secondary | ICD-10-CM | POA: Diagnosis not present

## 2017-03-27 NOTE — Telephone Encounter (Signed)
Last visit note shows to discontinue and also continue Avapro. She wanted to check on this.  [phone]

## 2017-03-27 NOTE — Telephone Encounter (Signed)
Pt spouse informed to have patient take the irbesartan once daily.

## 2017-04-03 DIAGNOSIS — F0390 Unspecified dementia without behavioral disturbance: Secondary | ICD-10-CM | POA: Diagnosis not present

## 2017-04-03 DIAGNOSIS — E1142 Type 2 diabetes mellitus with diabetic polyneuropathy: Secondary | ICD-10-CM | POA: Diagnosis not present

## 2017-04-10 DIAGNOSIS — F0390 Unspecified dementia without behavioral disturbance: Secondary | ICD-10-CM | POA: Diagnosis not present

## 2017-04-10 DIAGNOSIS — D3131 Benign neoplasm of right choroid: Secondary | ICD-10-CM | POA: Diagnosis not present

## 2017-04-10 DIAGNOSIS — H524 Presbyopia: Secondary | ICD-10-CM | POA: Diagnosis not present

## 2017-04-10 DIAGNOSIS — E1142 Type 2 diabetes mellitus with diabetic polyneuropathy: Secondary | ICD-10-CM | POA: Diagnosis not present

## 2017-04-10 DIAGNOSIS — H2513 Age-related nuclear cataract, bilateral: Secondary | ICD-10-CM | POA: Diagnosis not present

## 2017-04-10 DIAGNOSIS — H52223 Regular astigmatism, bilateral: Secondary | ICD-10-CM | POA: Diagnosis not present

## 2017-04-10 DIAGNOSIS — E119 Type 2 diabetes mellitus without complications: Secondary | ICD-10-CM | POA: Diagnosis not present

## 2017-04-10 DIAGNOSIS — H5213 Myopia, bilateral: Secondary | ICD-10-CM | POA: Diagnosis not present

## 2017-04-15 DIAGNOSIS — Z7982 Long term (current) use of aspirin: Secondary | ICD-10-CM | POA: Diagnosis not present

## 2017-04-15 DIAGNOSIS — Z7984 Long term (current) use of oral hypoglycemic drugs: Secondary | ICD-10-CM | POA: Diagnosis not present

## 2017-04-15 DIAGNOSIS — F0390 Unspecified dementia without behavioral disturbance: Secondary | ICD-10-CM | POA: Diagnosis not present

## 2017-04-15 DIAGNOSIS — E1142 Type 2 diabetes mellitus with diabetic polyneuropathy: Secondary | ICD-10-CM | POA: Diagnosis not present

## 2017-04-15 DIAGNOSIS — I10 Essential (primary) hypertension: Secondary | ICD-10-CM | POA: Diagnosis not present

## 2017-04-15 DIAGNOSIS — D473 Essential (hemorrhagic) thrombocythemia: Secondary | ICD-10-CM | POA: Diagnosis not present

## 2017-04-15 DIAGNOSIS — M109 Gout, unspecified: Secondary | ICD-10-CM | POA: Diagnosis not present

## 2017-04-17 DIAGNOSIS — E1142 Type 2 diabetes mellitus with diabetic polyneuropathy: Secondary | ICD-10-CM | POA: Diagnosis not present

## 2017-04-17 DIAGNOSIS — F0390 Unspecified dementia without behavioral disturbance: Secondary | ICD-10-CM | POA: Diagnosis not present

## 2017-04-18 ENCOUNTER — Telehealth: Payer: Self-pay | Admitting: Internal Medicine

## 2017-04-18 NOTE — Telephone Encounter (Signed)
Notified Betty w/MD response../lmb 

## 2017-04-18 NOTE — Telephone Encounter (Signed)
See message below °

## 2017-04-18 NOTE — Telephone Encounter (Signed)
yes

## 2017-04-18 NOTE — Telephone Encounter (Signed)
Copied from [place] [phone]. Topic: Quick Communication - See Telephone Encounter >> Apr 18, 2017 10:08 AM Antonieta Iba C wrote: CRM for notification. See Telephone encounter for:  04/18/17.

## 2017-04-18 NOTE — Telephone Encounter (Signed)
Copied from [place] [id number]. Topic: Quick Communication - See Telephone Encounter >> Apr 18, 2017 10:08 AM Antonieta Iba C wrote: CRM for notification. See Telephone encounter for:  04/18/17.    [place] health nurse w/ Alvis Lemmings [phone]   Called in to request 2 more days to work with pt. She said that she would like to teach pt's spouse how to administer pt's medication.    Please advise.

## 2017-04-24 DIAGNOSIS — F0390 Unspecified dementia without behavioral disturbance: Secondary | ICD-10-CM | POA: Diagnosis not present

## 2017-04-24 DIAGNOSIS — E1142 Type 2 diabetes mellitus with diabetic polyneuropathy: Secondary | ICD-10-CM | POA: Diagnosis not present

## 2017-05-01 DIAGNOSIS — E1142 Type 2 diabetes mellitus with diabetic polyneuropathy: Secondary | ICD-10-CM | POA: Diagnosis not present

## 2017-05-01 DIAGNOSIS — F0390 Unspecified dementia without behavioral disturbance: Secondary | ICD-10-CM | POA: Diagnosis not present

## 2017-05-11 ENCOUNTER — Other Ambulatory Visit: Payer: Self-pay | Admitting: Internal Medicine

## 2017-05-11 DIAGNOSIS — H6981 Other specified disorders of Eustachian tube, right ear: Secondary | ICD-10-CM

## 2017-05-14 ENCOUNTER — Other Ambulatory Visit: Payer: Self-pay | Admitting: Internal Medicine

## 2017-05-14 DIAGNOSIS — E1140 Type 2 diabetes mellitus with diabetic neuropathy, unspecified: Secondary | ICD-10-CM

## 2017-05-14 DIAGNOSIS — I10 Essential (primary) hypertension: Secondary | ICD-10-CM

## 2017-06-05 ENCOUNTER — Encounter: Payer: Self-pay | Admitting: Adult Health

## 2017-06-05 ENCOUNTER — Encounter (INDEPENDENT_AMBULATORY_CARE_PROVIDER_SITE_OTHER): Payer: Self-pay

## 2017-06-05 ENCOUNTER — Ambulatory Visit (INDEPENDENT_AMBULATORY_CARE_PROVIDER_SITE_OTHER): Payer: Medicare Other | Admitting: Adult Health

## 2017-06-05 VITALS — BP 142/70 | HR 62 | Wt 140.0 lb

## 2017-06-05 DIAGNOSIS — R413 Other amnesia: Secondary | ICD-10-CM

## 2017-06-05 MED ORDER — DONEPEZIL HCL 5 MG PO TABS: 5.0000 mg | ORAL_TABLET | Freq: Every day | ORAL | 5 refills | Status: DC

## 2017-06-05 NOTE — Progress Notes (Signed)
I have read the note, and I agree with the clinical assessment and plan.  Lauri Till K Johnetta Sloniker   

## 2017-06-05 NOTE — Progress Notes (Signed)
PATIENT: [name] DOB: [birth year]  REASON FOR VISIT: follow up HISTORY FROM: patient  HISTORY OF PRESENT ILLNESS: Today 06/05/17 [name] is a 77 year old female with a history of memory disturbance.  She returns today for follow-up.  She is currently not on any medication for her memory.  He feels that her memory has remained the same however daughter feels is gotten worse.  She continues to live at home with her husband.  She is able to complete all ADLs independently.  She does do some cooking however the daughter reports that she has deferred food at times.  She denies any trouble sleeping.  Denies any significant changes with her mood.  Over the holidays her daughter noticed that she did not handle crowds very well and became agitated.  She denies hallucinations although she does report that sometimes when she is going to sleep she feels as if she hears singing.  She is unsure if this is a hallucination or if it is real.  She returns today for an evaluation.  HISTORY 12/07/16:  [name]  is a 77 year old female with a history of memory disturbance. She returns today for follow-up. The patient states that she did not get the MRI as ordered by Dr. Jannifer [place]. However she is interested in doing that today. The patient feels that her memory has gotten slightly worse. She was at home with her husband. She is able to complete all ADLs independently. She does not operate a motor vehicle. She continues to do cooking in the home without difficulty. Her husband does the finances together. She denies any trouble sleeping. Denies hallucinations. Her daughter states that she sometimes will mix up appointments. She also can't remember conversations that she may have recently had. Her husband and daughter also reports that she is very repetitive asking questions continuously. The family still reports that the symptoms but only started after her next visit. She returns today for an evaluation   REVIEW OF  SYSTEMS: Out of a complete 14 system review of symptoms, the patient complains only of the following symptoms, and all other reviewed systems are negative.  See HPI  ALLERGIES: Allergies  Allergen Reactions  . Pneumococcal Vaccines Swelling    Per pt when received 2015  . Ampicillin Other (See Comments)    REACTION: causes urticaria  . Neomycin-Bacitracin Zn-Polymyx Rash  . Penicillins Other (See Comments)    REACTION: causes urticaria    HOME MEDICATIONS: Outpatient Medications Prior to Visit  Medication Sig Dispense Refill  . aspirin 81 MG EC tablet Take 1 tablet (81 mg total) by mouth daily. Swallow whole. 30 tablet 12  . hydrochlorothiazide (HYDRODIURIL) 25 MG tablet Take 1 tablet (25 mg total) by mouth daily. 90 tablet 0  . irbesartan (AVAPRO) 150 MG tablet Take 1 tablet (150 mg total) by mouth daily. 90 tablet 0  . metFORMIN (GLUCOPHAGE) 500 MG tablet Take 1 tablet (500 mg total) by mouth 2 (two) times daily with a meal. 180 tablet 0  . propranolol (INDERAL) 20 MG tablet Take 1 tablet (20 mg total) by mouth 2 (two) times daily. 180 tablet 0  . simvastatin (ZOCOR) 20 MG tablet Take 1 tablet (20 mg total) by mouth daily. 90 tablet 0  . levocetirizine (XYZAL) 5 MG tablet Take 1 tablet (5 mg total) by mouth every evening. (Patient not taking: Reported on 06/05/2017) 90 tablet 1  . hydrochlorothiazide (HYDRODIURIL) 25 MG tablet TAKE 1 TABLET(25 MG) BY MOUTH DAILY 90 tablet 0  .  metFORMIN (GLUCOPHAGE) 500 MG tablet TAKE 1 TABLET(500 MG) BY MOUTH TWICE DAILY 180 tablet 0   No facility-administered medications prior to visit.     PAST MEDICAL HISTORY: Past Medical History:  Diagnosis Date  . Contact dermatitis and other eczema, due to unspecified cause   . Displacement of lumbar intervertebral disc without myelopathy   . FH: cataracts   . Gout, unspecified   . Memory loss   . Occlusion and stenosis of carotid artery without mention of cerebral infarction   . Other and  unspecified hyperlipidemia   . Pain in joint, shoulder region   . Pain in limb   . Routine general medical examination at a health care facility   . Sciatica   . Type II or unspecified type diabetes mellitus without mention of complication, uncontrolled   . Unspecified essential hypertension   . Unspecified venous (peripheral) insufficiency     PAST SURGICAL HISTORY: Past Surgical History:  Procedure Laterality Date  . ANTERIOR CERVICAL DECOMP/DISCECTOMY FUSION N/A 12/22/2014   Procedure: Anterior Cervical Four-Five/Five-Six/Six-Seven Decompression/Diskectomy/ and Fsion;  Surgeon: Leeroy Cha, MD;  Location: [facility];  Service: Neurosurgery;  Laterality: N/A;  C4-5 C5-6 C6-7 Anterior cervical decompression/diskectomy/fusion  . LAPAROSCOPIC APPENDECTOMY    . LUMBAR LAMINECTOMY  07/2008   [place]  . TUBAL LIGATION      FAMILY HISTORY: Family History  Problem Relation Age of Onset  . Alzheimer's disease Mother   . Diabetes Mother   . Coronary artery disease Father        CABG  . Hypertension Father   . Cancer Brother   . Cancer Brother   . Colon cancer Neg Hx   . Breast cancer Neg Hx     SOCIAL HISTORY: Social History   Socioeconomic History  . Marital status: Married    Spouse name: Laverna Peace  . Number of children: 3  . Years of education: 50  . Highest education level: Not on file  Social Needs  . Financial resource strain: Not on file  . Food insecurity - worry: Not on file  . Food insecurity - inability: Not on file  . Transportation needs - medical: Not on file  . Transportation needs - non-medical: Not on file  Occupational History  . Not on file  Tobacco Use  . Smoking status: Former Smoker    Last attempt to quit: 05/23/1995    Years since quitting: 22.0  . Smokeless tobacco: Never Used  Substance and Sexual Activity  . Alcohol use: No  . Drug use: No  . Sexual activity: Yes    Partners: Male  Other Topics Concern  . Not on file  Social History  Narrative   HSG.  Married '59.  2 sons - '65, '62; 1 dtr '61; 7 grandchildren; 3 great-grandchildren.  Lives w/husband in own home.  END OF LIFE: she does not want to be kept alive on machines. Needs more information in regard to CPR, etc. Provided w/[employer] guide and MOST (7/09)      PHYSICAL EXAM  Vitals:   06/05/17 0822  BP: (!) 142/70  Pulse: 62  Weight: 140 lb (63.5 kg)   Body mass index is 25.61 kg/m.   MMSE - Mini Mental State Exam 06/05/2017 11/27/2016 10/17/2016  Orientation to time 4 4 5   Orientation to Place 4 4 5   Registration 3 3 3   Attention/ Calculation 0 0 3  Recall 0 0 0  Language- name 2 objects 2 2 2   Language- repeat  1 1 1   Language- follow 3 step command 3 3 3   Language- read & follow direction 1 1 1   Write a sentence 1 0 1  Copy design 1 1 1   Total score 20 19 25     Generalized: Well developed, in no acute distress   Neurological examination  Mentation: Alert oriented to time, place, history taking. Follows all commands speech and language fluent Cranial nerve II-XII: Pupils were equal round reactive to light. Extraocular movements were full, visual field were full on confrontational test. Facial sensation and strength were normal. Uvula tongue midline. Head turning and shoulder shrug  were normal and symmetric. Motor: The motor testing reveals 5 over 5 strength of all 4 extremities. Good symmetric motor tone is noted throughout.  Sensory: Sensory testing is intact to soft touch on all 4 extremities. No evidence of extinction is noted.  Coordination: Cerebellar testing reveals good finger-nose-finger and heel-to-shin bilaterally.  Gait and station: Gait is normal.  Tandem gait not attempted.  Romberg is negative Reflexes: Deep tendon reflexes are symmetric and normal bilaterally.   DIAGNOSTIC DATA (LABS, IMAGING, TESTING) - I reviewed patient records, labs, notes, testing and imaging myself where available.  Lab Results  Component Value Date   WBC  7.8 03/21/2017   HGB 13.4 03/21/2017   HCT 40.8 03/21/2017   MCV 88.9 03/21/2017   PLT 409.0 (H) 03/21/2017      Component Value Date/Time   NA 140 03/21/2017 1006   K 3.9 03/21/2017 1006   CL 101 03/21/2017 1006   CO2 30 03/21/2017 1006   GLUCOSE 105 (H) 03/21/2017 1006   BUN 27 (H) 03/21/2017 1006   CREATININE 1.00 03/21/2017 1006   CALCIUM 10.2 03/21/2017 1006   PROT 7.4 04/18/2016 0840   ALBUMIN 4.2 04/18/2016 0840   AST 23 04/18/2016 0840   ALT 22 04/18/2016 0840   ALKPHOS 80 04/18/2016 0840   BILITOT 0.9 04/18/2016 0840   GFRNONAA 59 (L) 12/24/2014 0450   GFRAA >60 12/24/2014 0450   Lab Results  Component Value Date   CHOL 148 10/17/2016   HDL 35.30 (L) 10/17/2016   LDLCALC 62 04/21/2015   LDLDIRECT 90.0 10/17/2016   TRIG 227.0 (H) 10/17/2016   CHOLHDL 4 10/17/2016   Lab Results  Component Value Date   HGBA1C 6.8 (H) 03/21/2017   Lab Results  Component Value Date   WIOXBDZH29 924 03/21/2017   Lab Results  Component Value Date   TSH 1.98 04/18/2016      ASSESSMENT AND PLAN 77 y.o. year old female  has a past medical history of Contact dermatitis and other eczema, due to unspecified cause, Displacement of lumbar intervertebral disc without myelopathy, FH: cataracts, Gout, unspecified, Memory loss, Occlusion and stenosis of carotid artery without mention of cerebral infarction, Other and unspecified hyperlipidemia, Pain in joint, shoulder region, Pain in limb, Routine general medical examination at a health care facility, Sciatica, Type II or unspecified type diabetes mellitus without mention of complication, uncontrolled, Unspecified essential hypertension, and Unspecified venous (peripheral) insufficiency. here with:  1.  Memory disturbance  The patient's memory score has remained stable.  She is interested in trying memory medication.  We will start her on Aricept 5 mg at bedtime.  I have reviewed potential side effects with the patient and provided her  with a handout.  She is advised that if she is unable to tolerate the medication she should let us know.  She will follow-up in 6 months or sooner if needed.  I spent 15 minutes with the patient. 50% of this time was spent reviewing medication Aricept     Ward Givens, MSN, NP-C 06/05/2017, 8:32 AM [facility] [address], [place], [place] [address] [phone]

## 2017-06-05 NOTE — Patient Instructions (Addendum)
Your Plan:  Start Aricept 5 mg at bedtime If your symptoms worsen or you develop new symptoms please let us know.   Thank you for coming to see [place] at [facility]. I hope we have been able to provide you high quality care today.  You may receive a patient satisfaction survey over the next few weeks. We would appreciate your feedback and comments so that we may continue to improve ourselves and the health of our patients.  Donepezil tablets What is this medicine? DONEPEZIL (doe NEP e zil) is used to treat mild to moderate dementia caused by Alzheimer's disease. This medicine may be used for other purposes; ask your health care provider or pharmacist if you have questions. COMMON BRAND NAME(S): Aricept What should I tell my health care provider before I take this medicine? They need to know if you have any of these conditions: -asthma or other lung disease -difficulty passing urine -head injury -heart disease -history of irregular heartbeat -liver disease -seizures (convulsions) -stomach or intestinal disease, ulcers or stomach bleeding -an unusual or allergic reaction to donepezil, other medicines, foods, dyes, or preservatives -pregnant or trying to get pregnant -breast-feeding How should I use this medicine? Take this medicine by mouth with a glass of water. Follow the directions on the prescription label. You may take this medicine with or without food. Take this medicine at regular intervals. This medicine is usually taken before bedtime. Do not take it more often than directed. Continue to take your medicine even if you feel better. Do not stop taking except on your doctor's advice. If you are taking the 23 mg donepezil tablet, swallow it whole; do not cut, crush, or chew it. Talk to your pediatrician regarding the use of this medicine in children. Special care may be needed. Overdosage: If you think you have taken too much of this medicine contact a poison  control center or emergency room at once. NOTE: This medicine is only for you. Do not share this medicine with others. What if I miss a dose? If you miss a dose, take it as soon as you can. If it is almost time for your next dose, take only that dose, do not take double or extra doses. What may interact with this medicine? Do not take this medicine with any of the following medications: -certain medicines for fungal infections like itraconazole, fluconazole, posaconazole, and voriconazole -cisapride -dextromethorphan; quinidine -dofetilide -dronedarone -pimozide -quinidine -thioridazine -ziprasidone This medicine may also interact with the following medications: -antihistamines for allergy, cough and cold -atropine -bethanechol -carbamazepine -certain medicines for bladder problems like oxybutynin, tolterodine -certain medicines for Parkinson's disease like benztropine, trihexyphenidyl -certain medicines for stomach problems like dicyclomine, hyoscyamine -certain medicines for travel sickness like scopolamine -dexamethasone -ipratropium -NSAIDs, medicines for pain and inflammation, like ibuprofen or naproxen -other medicines for Alzheimer's disease -other medicines that prolong the QT interval (cause an abnormal heart rhythm) -phenobarbital -phenytoin -rifampin, rifabutin or rifapentine This list may not describe all possible interactions. Give your health care provider a list of all the medicines, herbs, non-prescription drugs, or dietary supplements you use. Also tell them if you smoke, drink alcohol, or use illegal drugs. Some items may interact with your medicine. What should I watch for while using this medicine? Visit your doctor or health care professional for regular checks on your progress. Check with your doctor or health care professional if your symptoms do not get better or if they get worse. You may get drowsy or dizzy. Do  not drive, use machinery, or do anything that  needs mental alertness until you know how this drug affects you. What side effects may I notice from receiving this medicine? Side effects that you should report to your doctor or health care professional as soon as possible: -allergic reactions like skin rash, itching or hives, swelling of the face, lips, or tongue -feeling faint or lightheaded, falls -loss of bladder control -seizures -signs and symptoms of a dangerous change in heartbeat or heart rhythm like chest pain; dizziness; fast or irregular heartbeat; palpitations; feeling faint or lightheaded, falls; breathing problems -signs and symptoms of infection like fever or chills; cough; sore throat; pain or trouble passing urine -signs and symptoms of liver injury like dark yellow or brown urine; general ill feeling or flu-like symptoms; light-colored stools; loss of appetite; nausea; right upper belly pain; unusually weak or tired; yellowing of the eyes or skin -slow heartbeat or palpitations -unusual bleeding or bruising -vomiting Side effects that usually do not require medical attention (report to your doctor or health care professional if they continue or are bothersome): -diarrhea, especially when starting treatment -headache -loss of appetite -muscle cramps -nausea -stomach upset This list may not describe all possible side effects. Call your doctor for medical advice about side effects. You may report side effects to FDA at 1-800-FDA-1088. Where should I keep my medicine? Keep out of reach of children. Store at room temperature between 15 and 30 degrees C (59 and 86 degrees F). Throw away any unused medicine after the expiration date. NOTE: This sheet is a summary. It may not cover all possible information. If you have questions about this medicine, talk to your doctor, pharmacist, or health care provider.  2018 Elsevier/Gold Standard (2015-10-25 21:00:42)

## 2017-06-17 ENCOUNTER — Other Ambulatory Visit: Payer: Self-pay | Admitting: Internal Medicine

## 2017-06-17 DIAGNOSIS — E1140 Type 2 diabetes mellitus with diabetic neuropathy, unspecified: Secondary | ICD-10-CM

## 2017-06-17 DIAGNOSIS — I10 Essential (primary) hypertension: Secondary | ICD-10-CM

## 2017-08-06 ENCOUNTER — Other Ambulatory Visit: Payer: Self-pay | Admitting: Internal Medicine

## 2017-08-06 DIAGNOSIS — I10 Essential (primary) hypertension: Secondary | ICD-10-CM

## 2017-08-23 ENCOUNTER — Other Ambulatory Visit: Payer: Self-pay | Admitting: Internal Medicine

## 2017-08-23 DIAGNOSIS — E1140 Type 2 diabetes mellitus with diabetic neuropathy, unspecified: Secondary | ICD-10-CM

## 2017-08-23 DIAGNOSIS — I10 Essential (primary) hypertension: Secondary | ICD-10-CM

## 2017-08-29 LAB — HM DIABETES EYE EXAM

## 2017-09-06 ENCOUNTER — Other Ambulatory Visit: Payer: Self-pay | Admitting: Internal Medicine

## 2017-09-06 NOTE — Telephone Encounter (Signed)
appt is made

## 2017-09-06 NOTE — Telephone Encounter (Signed)
erx sent for 30 day supply to get pt to appt.

## 2017-09-06 NOTE — Telephone Encounter (Signed)
Pt needs an appt for refills.  

## 2017-09-15 ENCOUNTER — Other Ambulatory Visit: Payer: Self-pay | Admitting: Internal Medicine

## 2017-09-15 DIAGNOSIS — I10 Essential (primary) hypertension: Secondary | ICD-10-CM

## 2017-09-15 DIAGNOSIS — E1140 Type 2 diabetes mellitus with diabetic neuropathy, unspecified: Secondary | ICD-10-CM

## 2017-09-26 ENCOUNTER — Ambulatory Visit (INDEPENDENT_AMBULATORY_CARE_PROVIDER_SITE_OTHER): Payer: Medicare Other | Admitting: Internal Medicine

## 2017-09-26 ENCOUNTER — Other Ambulatory Visit (INDEPENDENT_AMBULATORY_CARE_PROVIDER_SITE_OTHER): Payer: Medicare Other

## 2017-09-26 ENCOUNTER — Encounter: Payer: Self-pay | Admitting: Internal Medicine

## 2017-09-26 VITALS — BP 162/78 | HR 65 | Temp 98.8°F | Resp 16 | Ht 62.0 in | Wt 142.0 lb

## 2017-09-26 DIAGNOSIS — I70213 Atherosclerosis of native arteries of extremities with intermittent claudication, bilateral legs: Secondary | ICD-10-CM

## 2017-09-26 DIAGNOSIS — D75839 Thrombocytosis, unspecified: Secondary | ICD-10-CM

## 2017-09-26 DIAGNOSIS — E785 Hyperlipidemia, unspecified: Secondary | ICD-10-CM

## 2017-09-26 DIAGNOSIS — D473 Essential (hemorrhagic) thrombocythemia: Secondary | ICD-10-CM

## 2017-09-26 DIAGNOSIS — I10 Essential (primary) hypertension: Secondary | ICD-10-CM

## 2017-09-26 DIAGNOSIS — E1140 Type 2 diabetes mellitus with diabetic neuropathy, unspecified: Secondary | ICD-10-CM | POA: Diagnosis not present

## 2017-09-26 DIAGNOSIS — Z23 Encounter for immunization: Secondary | ICD-10-CM

## 2017-09-26 LAB — LIPID PANEL
Cholesterol: 130 mg/dL (ref 0–200)
HDL: 29.7 mg/dL — ABNORMAL LOW (ref >39.00)
NonHDL: 100.72
Total CHOL/HDL Ratio: 4
Triglycerides: 209 mg/dL — ABNORMAL HIGH (ref 0.0–149.0)
VLDL: 41.8 mg/dL — ABNORMAL HIGH (ref 0.0–40.0)

## 2017-09-26 LAB — URINALYSIS, ROUTINE W REFLEX MICROSCOPIC
Bilirubin Urine: NEGATIVE
Hgb urine dipstick: NEGATIVE
Ketones, ur: NEGATIVE
Nitrite: NEGATIVE
RBC / HPF: NONE SEEN (ref 0–?)
SPECIFIC GRAVITY, URINE: 1.01 (ref 1.000–1.030)
TOTAL PROTEIN, URINE-UPE24: NEGATIVE
URINE GLUCOSE: NEGATIVE
Urobilinogen, UA: 0.2 (ref 0.0–1.0)
pH: 6 (ref 5.0–8.0)

## 2017-09-26 LAB — CBC WITH DIFFERENTIAL/PLATELET
BASOS PCT: 1.1 % (ref 0.0–3.0)
Basophils Absolute: 0.1 10*3/uL (ref 0.0–0.1)
Eosinophils Absolute: 0.7 10*3/uL (ref 0.0–0.7)
Eosinophils Relative: 8.1 % — ABNORMAL HIGH (ref 0.0–5.0)
HEMATOCRIT: 35.1 % — AB (ref 36.0–46.0)
Hemoglobin: 11.7 g/dL — ABNORMAL LOW (ref 12.0–15.0)
LYMPHS ABS: 2.9 10*3/uL (ref 0.7–4.0)
LYMPHS PCT: 36.1 % (ref 12.0–46.0)
MCHC: 33.4 g/dL (ref 30.0–36.0)
MCV: 88.6 fl (ref 78.0–100.0)
MONOS PCT: 9.6 % (ref 3.0–12.0)
Monocytes Absolute: 0.8 10*3/uL (ref 0.1–1.0)
NEUTROS ABS: 3.6 10*3/uL (ref 1.4–7.7)
Neutrophils Relative %: 45.1 % (ref 43.0–77.0)
PLATELETS: 422 10*3/uL — AB (ref 150.0–400.0)
RBC: 3.96 Mil/uL (ref 3.87–5.11)
RDW: 13.7 % (ref 11.5–15.5)
WBC: 8 10*3/uL (ref 4.0–10.5)

## 2017-09-26 LAB — COMPREHENSIVE METABOLIC PANEL
ALT: 22 U/L (ref 0–35)
AST: 23 U/L (ref 0–37)
Albumin: 4.1 g/dL (ref 3.5–5.2)
Alkaline Phosphatase: 86 U/L (ref 39–117)
BILIRUBIN TOTAL: 1 mg/dL (ref 0.2–1.2)
BUN: 23 mg/dL (ref 6–23)
CO2: 29 mEq/L (ref 19–32)
CREATININE: 0.93 mg/dL (ref 0.40–1.20)
Calcium: 9.7 mg/dL (ref 8.4–10.5)
Chloride: 102 mEq/L (ref 96–112)
GFR: 61.98 mL/min (ref >60.00)
GLUCOSE: 100 mg/dL — AB (ref 70–99)
POTASSIUM: 4.1 meq/L (ref 3.5–5.1)
SODIUM: 139 meq/L (ref 135–145)
TOTAL PROTEIN: 7.3 g/dL (ref 6.0–8.3)

## 2017-09-26 LAB — LDL CHOLESTEROL, DIRECT: Direct LDL: 80 mg/dL

## 2017-09-26 LAB — HEMOGLOBIN A1C: Hgb A1c MFr Bld: 6.6 % — ABNORMAL HIGH (ref 4.6–6.5)

## 2017-09-26 LAB — TSH: TSH: 2.09 u[IU]/mL (ref 0.35–4.50)

## 2017-09-26 LAB — MICROALBUMIN / CREATININE URINE RATIO
Creatinine,U: 43.2 mg/dL
Microalb Creat Ratio: 1.6 mg/g (ref 0.0–30.0)
Microalb, Ur: <0.7 mg/dL (ref 0.0–1.9)

## 2017-09-26 NOTE — Progress Notes (Signed)
Subjective:  Patient ID: [name], female    DOB: 1940/10/09  Age: 77 y.o. MRN: [id number]  CC: Diabetes; Hyperlipidemia; and Hypertension   HPI RONIA HAZELETT presents for f/up - She tells me that her blood pressure and blood sugar have been well controlled.  She complains that her memory is declining but she tells me she is compliant with Aricept and will continue to follow-up with neurology.  She denies any recent episodes of headache, chest pain, shortness of breath, edema, or polys.  Outpatient Medications Prior to Visit  Medication Sig Dispense Refill  . aspirin 81 MG EC tablet Take 1 tablet (81 mg total) by mouth daily. Swallow whole. 30 tablet 12  . donepezil (ARICEPT) 5 MG tablet Take 1 tablet (5 mg total) by mouth at bedtime. 30 tablet 5  . hydrochlorothiazide (HYDRODIURIL) 25 MG tablet Take 1 tablet (25 mg total) by mouth daily. 90 tablet 0  . irbesartan (AVAPRO) 150 MG tablet TAKE 1 TABLET(150 MG) BY MOUTH DAILY 90 tablet 0  . levocetirizine (XYZAL) 5 MG tablet Take 1 tablet (5 mg total) by mouth every evening. 90 tablet 1  . metFORMIN (GLUCOPHAGE) 500 MG tablet Take 1 tablet (500 mg total) by mouth 2 (two) times daily with a meal. 180 tablet 0  . propranolol (INDERAL) 20 MG tablet TAKE 1 TABLET(20 MG) BY MOUTH TWICE DAILY 180 tablet 0  . simvastatin (ZOCOR) 20 MG tablet TAKE 1 TABLET(20 MG) BY MOUTH DAILY AT 6:00 PM 90 tablet 1   No facility-administered medications prior to visit.     ROS Review of Systems  Constitutional: Negative.  Negative for appetite change, chills, fatigue and fever.  HENT: Negative.   Eyes: Negative for visual disturbance.  Respiratory: Negative for cough, chest tightness, shortness of breath and wheezing.   Cardiovascular: Negative for chest pain, palpitations and leg swelling.  Gastrointestinal: Negative for abdominal pain, constipation, diarrhea, nausea and vomiting.  Endocrine: Negative.  Negative for polydipsia, polyphagia and polyuria.    Genitourinary: Negative.  Negative for difficulty urinating.  Musculoskeletal: Negative.  Negative for arthralgias, back pain, myalgias and neck pain.  Skin: Negative.  Negative for color change and rash.  Neurological: Negative.  Negative for dizziness, weakness, light-headedness and headaches.  Hematological: Negative for adenopathy. Does not bruise/bleed easily.  Psychiatric/Behavioral: Negative.     Objective:  BP (!) 162/78 (BP Location: Left Arm, Patient Position: Sitting, Cuff Size: Normal)   Pulse 65   Temp 98.8 F (37.1 C) (Oral)   Resp 16   Ht 5\' 2"  (1.575 m)   Wt 142 lb (64.4 kg)   SpO2 97%   BMI 25.97 kg/m   BP Readings from Last 3 Encounters:  09/26/17 (!) 162/78  06/05/17 (!) 142/70  03/21/17 (!) 174/94    Wt Readings from Last 3 Encounters:  09/26/17 142 lb (64.4 kg)  06/05/17 140 lb (63.5 kg)  03/21/17 143 lb 4 oz (65 kg)    Physical Exam  Constitutional: No distress.  HENT:  Mouth/Throat: Oropharynx is clear and moist. No oropharyngeal exudate.  Eyes: Conjunctivae are normal. Left eye exhibits no discharge. No scleral icterus.  Neck: Normal range of motion. Neck supple. No JVD present. No tracheal deviation present. No thyromegaly present.  Cardiovascular: Normal rate, regular rhythm and normal heart sounds. Exam reveals no gallop and no friction rub.  No murmur heard. Pulmonary/Chest: Effort normal and breath sounds normal. No stridor. No respiratory distress. She has no wheezes. She has no rales.  Abdominal: Soft. Bowel sounds are normal. She exhibits no distension and no mass. There is no tenderness.  Musculoskeletal: Normal range of motion. She exhibits no edema, tenderness or deformity.  Neurological: She is alert.  Skin: Skin is warm and dry. She is not diaphoretic.  Psychiatric: She has a normal mood and affect. Her speech is normal and behavior is normal. Judgment and thought content normal. Her mood appears not anxious. She is not slowed and  not withdrawn. Cognition and memory are impaired. She does not exhibit a depressed mood. She exhibits abnormal recent memory and abnormal remote memory.  Vitals reviewed.   Lab Results  Component Value Date   WBC 8.0 09/26/2017   HGB 11.7 (L) 09/26/2017   HCT 35.1 (L) 09/26/2017   PLT 422.0 (H) 09/26/2017   GLUCOSE 100 (H) 09/26/2017   CHOL 130 09/26/2017   TRIG 209.0 (H) 09/26/2017   HDL 29.70 (L) 09/26/2017   LDLDIRECT 80.0 09/26/2017   LDLCALC 62 04/21/2015   ALT 22 09/26/2017   AST 23 09/26/2017   NA 139 09/26/2017   K 4.1 09/26/2017   CL 102 09/26/2017   CREATININE 0.93 09/26/2017   BUN 23 09/26/2017   CO2 29 09/26/2017   TSH 2.09 09/26/2017   HGBA1C 6.6 (H) 09/26/2017   MICROALBUR <0.7 09/26/2017    Mr Brain Wo Contrast  Result Date: 12/18/2016  [facility] [address], [place], [place] [address] [phone] NEUROIMAGING REPORT STUDY DATE: 12/15/2016 PATIENT NAME: [name] DOB: 05-11-1941 MRN: [id number] EXAM: MRI Brain without contrast ORDERING CLINICIAN: Kathrynn Ducking M.D. CLINICAL HISTORY: 77 year old woman with memory loss COMPARISON FILMS: 12/14/2011 TECHNIQUE: MRI of the brain without contrast was obtained utilizing 5 mm axial slices with T1, T2, T2 flair, SWI and diffusion weighted views.  T1 sagittal and T2 coronal views were obtained. CONTRAST: none IMAGING SITE: [facility], [place], [place] FINDINGS: On sagittal images, the spinal cord is imaged caudally to C4 and is normal in caliber. Metal artifact, probably from ACDF,  is noted at C4.  The contents of the posterior fossa are of normal size and position.   The pituitary gland and optic chiasm appear normal.    There is moderate cortical atrophy most pronounced in the mesial temporal lobes. The atrophy has significantly progressed rest when compared to the 12/14/2011 MRI.  The ventricles are proportionately enlarged.  There are no abnormal extra-axial collections  of fluid.  The cerebellum and brainstem appears normal.   The deep gray matter appears normal.  The hemispheres, there are T2/FLAIR hyperintense foci predominantly in the periventricular and deep white matter distant with chronic microvessel ischemic change. When compared to the MRI of the brain dated 12/14/2011, this has progressed..  Diffusion weighted images are normal.  Susceptibility weighted images are normal.  The orbits appear normal.   The VIIth/VIIIth nerve complex appears normal.  The mastoid air cells appear normal.  The paranasal sinuses appear normal.  Flow voids are identified within the major intracerebral arteries.      This MRI of the brain without contrast shows the following: 1.    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 This has significantly progressed when compared to the MRI dated 12/14/2011. 2.    Chronic microvascular ischemic change, I'll progress we'll compared to the 12/14/2011 MRI. 3.    There are no acute findings. INTERPRETING PHYSICIAN: Richard A. Felecia Shelling, MD, PhD, FAAN Certified in  Neuroimaging by [employer]  Society of Neuroimaging    Assessment & Plan:   Gizella was seen today for diabetes, hyperlipidemia and hypertension.  Diagnoses and all orders for this visit:  Essential hypertension- Her blood pressure is adequately well controlled.  Electrolytes and renal function are normal. -     Comprehensive metabolic panel; Future -     CBC with Differential/Platelet; Future -     Urinalysis, Routine w reflex microscopic; Future  Controlled type 2 diabetes mellitus with diabetic neuropathy, without long-term current use of insulin ([place])- Her A1c is at 6.6%.  Her blood sugars are adequately controlled.  Will continue monitor therapy with metformin. -     Microalbumin / creatinine urine ratio; Future -     Hemoglobin A1c; Future -     Ambulatory referral to Ophthalmology  Thrombocytosis ([place])- Her platelet count remains mildly  elevated at 422.  She has also developed a mild anemia.  I have asked her to return to have this rechecked and to be screened for vitamin deficiencies such as iron and B12. -     CBC with Differential/Platelet; Future  Dyslipidemia, goal LDL below 70- She has achieved her LDL goal and is doing well on the statin. -     Lipid panel; Future -     TSH; Future  Atherosclerosis of native artery of both lower extremities with intermittent claudication ([place]) - she denies claudication and the pulses in her feet are palpable.  There is no evidence of ischemia.  Will continue risk factor modifications. -     Lipid panel; Future  Need for Tdap vaccination -     Tdap vaccine greater than or equal to 7yo IM   I am having Cumi J. Cronic maintain her aspirin, metFORMIN, hydrochlorothiazide, levocetirizine, donepezil, propranolol, simvastatin, and irbesartan.  No orders of the defined types were placed in this encounter.    Follow-up: Return in about 6 months (around 03/29/2018).  Scarlette Calico, MD

## 2017-09-26 NOTE — Patient Instructions (Signed)

## 2017-09-30 ENCOUNTER — Other Ambulatory Visit: Payer: Self-pay | Admitting: Internal Medicine

## 2017-09-30 DIAGNOSIS — I10 Essential (primary) hypertension: Secondary | ICD-10-CM

## 2017-10-05 ENCOUNTER — Other Ambulatory Visit: Payer: Self-pay | Admitting: Adult Health

## 2017-10-05 ENCOUNTER — Other Ambulatory Visit: Payer: Self-pay | Admitting: Internal Medicine

## 2017-10-05 DIAGNOSIS — E1140 Type 2 diabetes mellitus with diabetic neuropathy, unspecified: Secondary | ICD-10-CM

## 2017-10-05 DIAGNOSIS — I10 Essential (primary) hypertension: Secondary | ICD-10-CM

## 2017-10-23 ENCOUNTER — Ambulatory Visit: Payer: Medicare Other

## 2017-10-23 NOTE — Progress Notes (Deleted)
Subjective:   [name] is a 77 y.o. female who presents for Medicare Annual (Subsequent) preventive examination.  Review of Systems:  No ROS.  Medicare Wellness Visit. Additional risk factors are reflected in the social history.    Sleep patterns: {SX; SLEEP PATTERNS:18802::"feels rested on waking","does not get up to void","gets up *** times nightly to void","sleeps *** hours nightly"}.    Home Safety/Smoke Alarms: Feels safe in home. Smoke alarms in place.  Living environment; residence and Firearm Safety: {Rehab home environment / accessibility:30080::"no firearms","firearms stored safely"}. Seat Belt Safety/Bike Helmet: Wears seat belt.    Objective:     Vitals: There were no vitals taken for this visit.  There is no height or weight on file to calculate BMI.  Advanced Directives 10/17/2016 12/22/2014 12/18/2014  Does Patient Have a Medical Advance Directive? Yes No No  Type of Paramedic of [place];Living will - -  Copy of [place] in Chart? No - copy requested - -  Would patient like information on creating a medical advance directive? - No - patient declined information No - patient declined information    Tobacco Social History   Tobacco Use  Smoking Status Former Smoker  . Last attempt to quit: 05/23/1995  . Years since quitting: 22.4  Smokeless Tobacco Never Used     Counseling given: Not Answered  Past Medical History:  Diagnosis Date  . Contact dermatitis and other eczema, due to unspecified cause   . Displacement of lumbar intervertebral disc without myelopathy   . FH: cataracts   . Gout, unspecified   . Memory loss   . Occlusion and stenosis of carotid artery without mention of cerebral infarction   . Other and unspecified hyperlipidemia   . Pain in joint, shoulder region   . Pain in limb   . Routine general medical examination at a health care facility   . Sciatica   . Type II or unspecified type diabetes  mellitus without mention of complication, uncontrolled   . Unspecified essential hypertension   . Unspecified venous (peripheral) insufficiency    Past Surgical History:  Procedure Laterality Date  . ANTERIOR CERVICAL DECOMP/DISCECTOMY FUSION N/A 12/22/2014   Procedure: Anterior Cervical Four-Five/Five-Six/Six-Seven Decompression/Diskectomy/ and Fsion;  Surgeon: Leeroy Cha, MD;  Location: [facility];  Service: Neurosurgery;  Laterality: N/A;  C4-5 C5-6 C6-7 Anterior cervical decompression/diskectomy/fusion  . LAPAROSCOPIC APPENDECTOMY    . LUMBAR LAMINECTOMY  07/2008   [place]  . TUBAL LIGATION     Family History  Problem Relation Age of Onset  . Alzheimer's disease Mother   . Diabetes Mother   . Coronary artery disease Father        CABG  . Hypertension Father   . Cancer Brother   . Cancer Brother   . Colon cancer Neg Hx   . Breast cancer Neg Hx    Social History   Socioeconomic History  . Marital status: Married    Spouse name: [name]  . Number of children: 3  . Years of education: 26  . Highest education level: Not on file  Occupational History  . Not on file  Social Needs  . Financial resource strain: Not on file  . Food insecurity:    Worry: Not on file    Inability: Not on file  . Transportation needs:    Medical: Not on file    Non-medical: Not on file  Tobacco Use  . Smoking status: Former Smoker  Last attempt to quit: 05/23/1995    Years since quitting: 22.4  . Smokeless tobacco: Never Used  Substance and Sexual Activity  . Alcohol use: No  . Drug use: No  . Sexual activity: Yes    Partners: Male  Lifestyle  . Physical activity:    Days per week: Not on file    Minutes per session: Not on file  . Stress: Not on file  Relationships  . Social connections:    Talks on phone: Not on file    Gets together: Not on file    Attends religious service: Not on file    Active member of club or organization: Not on file    Attends meetings of clubs or  organizations: Not on file    Relationship status: Not on file  Other Topics Concern  . Not on file  Social History Narrative   HSG.  Married '59.  2 sons - '65, '62; 1 dtr '61; 7 grandchildren; 3 great-grandchildren.  Lives w/husband in own home.  END OF LIFE: she does not want to be kept alive on machines. Needs more information in regard to CPR, etc. Provided w/[employer] guide and MOST (7/09)    Outpatient Encounter Medications as of 10/23/2017  Medication Sig  . aspirin 81 MG EC tablet Take 1 tablet (81 mg total) by mouth daily. Swallow whole.  . donepezil (ARICEPT) 5 MG tablet TAKE 1 TABLET(5 MG) BY MOUTH AT BEDTIME  . hydrochlorothiazide (HYDRODIURIL) 25 MG tablet Take 1 tablet (25 mg total) by mouth daily.  . hydrochlorothiazide (HYDRODIURIL) 25 MG tablet TAKE 1 TABLET(25 MG) BY MOUTH DAILY  . irbesartan (AVAPRO) 150 MG tablet Take 1 tablet (150 mg total) by mouth daily.  Marland Kitchen levocetirizine (XYZAL) 5 MG tablet Take 1 tablet (5 mg total) by mouth every evening.  . metFORMIN (GLUCOPHAGE) 500 MG tablet Take 1 tablet (500 mg total) by mouth 2 (two) times daily with a meal.  . propranolol (INDERAL) 20 MG tablet TAKE 1 TABLET(20 MG) BY MOUTH TWICE DAILY  . simvastatin (ZOCOR) 20 MG tablet TAKE 1 TABLET(20 MG) BY MOUTH DAILY AT 6:00 PM   No facility-administered encounter medications on file as of 10/23/2017.     Activities of Daily Living No flowsheet data found.  Patient Care Team: Janith Lima, MD as PCP - General (Internal Medicine) Leeroy Cha, MD (Neurosurgery) Newton Pigg, MD (Obstetrics and Gynecology) Druscilla Brownie, MD (Dermatology) Marica Otter, OD (Optometry)    Assessment:   This is a routine wellness examination for [name]. Physical assessment deferred to PCP.   Exercise Activities and Dietary recommendations   Diet (meal preparation, eat out, water intake, caffeinated beverages, dairy products, fruits and vegetables): {Desc; diets:16563}       Goals     None      Fall Risk Fall Risk  06/05/2017 11/27/2016 10/17/2016 04/28/2016 04/21/2015  Falls in the past year? No Yes Yes Yes No  Comment - - - Emmi Telephone Survey: data to providers prior to load -  Number falls in past yr: - 1 2 or more 1 -  Comment - - - Emmi Telephone Survey Actual Response = 1 -  Injury with Fall? - No No No -  Risk for fall due to : - Other (Comment) - - -  Risk for fall due to: Comment - "feet got tangled up" - - -  Follow up - - Education provided;Falls prevention discussed - -    Depression Screen PHQ 2/9 Scores  10/17/2016 04/21/2015 03/31/2014 11/26/2013  PHQ - 2 Score 0 0 0 0     Cognitive Function MMSE - Mini Mental State Exam 06/05/2017 11/27/2016 10/17/2016  Orientation to time 4 4 5   Orientation to Place 4 4 5   Registration 3 3 3   Attention/ Calculation 0 0 3  Recall 0 0 0  Language- name 2 objects 2 2 2   Language- repeat 1 1 1   Language- follow 3 step command 3 3 3   Language- read & follow direction 1 1 1   Write a sentence 1 0 1  Copy design 1 1 1   Total score 20 19 25         Immunization History  Administered Date(s) Administered  . Influenza, High Dose Seasonal PF 03/11/2013, 04/21/2015, 04/18/2016, 03/12/2017  . Influenza,inj,Quad PF,6+ Mos 03/31/2014  . Pneumococcal Conjugate-13 07/22/2013  . Td 08/14/2007  . Tdap 09/26/2017   Screening Tests Health Maintenance  Topic Date Due  . OPHTHALMOLOGY EXAM  10/20/2016  . INFLUENZA VACCINE  12/20/2017  . FOOT EXAM  09/27/2018  . TETANUS/TDAP  09/27/2027  . DEXA SCAN  Completed  . PNA vac Low Risk Adult  Discontinued      Plan:    I have personally reviewed and noted the following in the patient's chart:   . Medical and social history . Use of alcohol, tobacco or illicit drugs  . Current medications and supplements . Functional ability and status . Nutritional status . Physical activity . Advanced directives . List of other physicians . Vitals . Screenings to include cognitive,  depression, and falls . Referrals and appointments  In addition, I have reviewed and discussed with patient certain preventive protocols, quality metrics, and best practice recommendations. A written personalized care plan for preventive services as well as general preventive health recommendations were provided to patient.     Michiel Cowboy, RN  10/23/2017

## 2017-10-30 ENCOUNTER — Encounter: Payer: Self-pay | Admitting: Internal Medicine

## 2017-10-30 ENCOUNTER — Ambulatory Visit (INDEPENDENT_AMBULATORY_CARE_PROVIDER_SITE_OTHER): Payer: Medicare Other | Admitting: Internal Medicine

## 2017-10-30 ENCOUNTER — Other Ambulatory Visit (INDEPENDENT_AMBULATORY_CARE_PROVIDER_SITE_OTHER): Payer: Medicare Other

## 2017-10-30 VITALS — BP 132/70 | HR 61 | Temp 98.2°F | Resp 16 | Ht 62.0 in | Wt 142.0 lb

## 2017-10-30 DIAGNOSIS — D539 Nutritional anemia, unspecified: Secondary | ICD-10-CM

## 2017-10-30 DIAGNOSIS — D638 Anemia in other chronic diseases classified elsewhere: Secondary | ICD-10-CM | POA: Insufficient documentation

## 2017-10-30 DIAGNOSIS — D473 Essential (hemorrhagic) thrombocythemia: Secondary | ICD-10-CM

## 2017-10-30 DIAGNOSIS — D75839 Thrombocytosis, unspecified: Secondary | ICD-10-CM

## 2017-10-30 DIAGNOSIS — I70213 Atherosclerosis of native arteries of extremities with intermittent claudication, bilateral legs: Secondary | ICD-10-CM | POA: Diagnosis not present

## 2017-10-30 LAB — CBC WITH DIFFERENTIAL/PLATELET
BASOS ABS: 0.1 10*3/uL (ref 0.0–0.1)
Basophils Relative: 0.8 % (ref 0.0–3.0)
EOS ABS: 0.6 10*3/uL (ref 0.0–0.7)
Eosinophils Relative: 6.8 % — ABNORMAL HIGH (ref 0.0–5.0)
HEMATOCRIT: 35.1 % — AB (ref 36.0–46.0)
Hemoglobin: 11.9 g/dL — ABNORMAL LOW (ref 12.0–15.0)
LYMPHS ABS: 3.1 10*3/uL (ref 0.7–4.0)
LYMPHS PCT: 34.6 % (ref 12.0–46.0)
MCHC: 33.8 g/dL (ref 30.0–36.0)
MCV: 87.5 fl (ref 78.0–100.0)
MONO ABS: 0.7 10*3/uL (ref 0.1–1.0)
Monocytes Relative: 8.2 % (ref 3.0–12.0)
NEUTROS PCT: 49.6 % (ref 43.0–77.0)
Neutro Abs: 4.5 10*3/uL (ref 1.4–7.7)
Platelets: 451 10*3/uL — ABNORMAL HIGH (ref 150.0–400.0)
RBC: 4.01 Mil/uL (ref 3.87–5.11)
RDW: 13.9 % (ref 11.5–15.5)
WBC: 9.1 10*3/uL (ref 4.0–10.5)

## 2017-10-30 LAB — FERRITIN: Ferritin: 20.4 ng/mL (ref 10.0–291.0)

## 2017-10-30 LAB — FOLATE

## 2017-10-30 LAB — IBC PANEL
IRON: 74 ug/dL (ref 42–145)
SATURATION RATIOS: 17.2 % — AB (ref 20.0–50.0)
TRANSFERRIN: 308 mg/dL (ref 212.0–360.0)

## 2017-10-30 LAB — VITAMIN B12: Vitamin B-12: 602 pg/mL (ref 211–911)

## 2017-10-30 NOTE — Progress Notes (Signed)
Subjective:  Patient ID: [name], female    DOB: 1940/06/06  Age: 77 y.o. MRN: [id number]  CC: Anemia   HPI [name] presents for f/up - Her labs about a month ago showed that she had a normochromic, normocytic anemia with a slight increase in her platelet counts.  She is asymptomatic with this.  She denies any sources of blood loss, easy bruising/bleeding, paresthesias, weakness, or fatigue.  He does not take any vitamin supplements.  Outpatient Medications Prior to Visit  Medication Sig Dispense Refill  . aspirin 81 MG EC tablet Take 1 tablet (81 mg total) by mouth daily. Swallow whole. 30 tablet 12  . donepezil (ARICEPT) 5 MG tablet TAKE 1 TABLET(5 MG) BY MOUTH AT BEDTIME 90 tablet 0  . hydrochlorothiazide (HYDRODIURIL) 25 MG tablet Take 1 tablet (25 mg total) by mouth daily. 90 tablet 0  . irbesartan (AVAPRO) 150 MG tablet Take 1 tablet (150 mg total) by mouth daily. 90 tablet 1  . levocetirizine (XYZAL) 5 MG tablet Take 1 tablet (5 mg total) by mouth every evening. 90 tablet 1  . metFORMIN (GLUCOPHAGE) 500 MG tablet Take 1 tablet (500 mg total) by mouth 2 (two) times daily with a meal. 180 tablet 0  . propranolol (INDERAL) 20 MG tablet TAKE 1 TABLET(20 MG) BY MOUTH TWICE DAILY 180 tablet 0  . simvastatin (ZOCOR) 20 MG tablet TAKE 1 TABLET(20 MG) BY MOUTH DAILY AT 6:00 PM 90 tablet 1  . hydrochlorothiazide (HYDRODIURIL) 25 MG tablet TAKE 1 TABLET(25 MG) BY MOUTH DAILY 90 tablet 1   No facility-administered medications prior to visit.     ROS Review of Systems  Constitutional: Negative.  Negative for diaphoresis, fatigue and fever.  HENT: Negative.  Negative for sore throat and trouble swallowing.   Eyes: Negative for visual disturbance.  Respiratory: Negative.  Negative for cough, chest tightness, shortness of breath and wheezing.   Cardiovascular: Negative for chest pain, palpitations and leg swelling.  Gastrointestinal: Negative for abdominal pain, blood in stool,  constipation, diarrhea, nausea and vomiting.  Genitourinary: Negative.  Negative for decreased urine volume, difficulty urinating, dysuria, hematuria and urgency.  Musculoskeletal: Negative.  Negative for arthralgias and myalgias.  Skin: Negative.  Negative for color change, pallor and rash.  Neurological: Negative.  Negative for dizziness, weakness, light-headedness and numbness.  Hematological: Negative for adenopathy. Does not bruise/bleed easily.  Psychiatric/Behavioral: Negative.     Objective:  BP 132/70 (BP Location: Left Arm, Patient Position: Sitting, Cuff Size: Normal)   Pulse 61   Temp 98.2 F (36.8 C) (Oral)   Resp 16   Ht 5\' 2"  (1.575 m)   Wt 142 lb (64.4 kg)   SpO2 99%   BMI 25.97 kg/m   BP Readings from Last 3 Encounters:  10/30/17 132/70  09/26/17 (!) 162/78  06/05/17 (!) 142/70    Wt Readings from Last 3 Encounters:  10/30/17 142 lb (64.4 kg)  09/26/17 142 lb (64.4 kg)  06/05/17 140 lb (63.5 kg)    Physical Exam  Constitutional: She is oriented to person, place, and time. No distress.  HENT:  Mouth/Throat: Oropharynx is clear and moist. No oropharyngeal exudate.  Eyes: Conjunctivae are normal. No scleral icterus.  Neck: Normal range of motion. Neck supple. No JVD present. No thyromegaly present.  Cardiovascular: Normal rate, regular rhythm and normal heart sounds. Exam reveals no gallop.  No murmur heard. Pulmonary/Chest: Effort normal and breath sounds normal. No respiratory distress. She has no wheezes. She  has no rales.  Abdominal: Soft. Normal appearance and bowel sounds are normal. She exhibits no mass. There is no hepatosplenomegaly. There is no tenderness.  Musculoskeletal: Normal range of motion. She exhibits no edema, tenderness or deformity.  Lymphadenopathy:    She has no cervical adenopathy.  Neurological: She is alert and oriented to person, place, and time.  Skin: Skin is warm and dry. She is not diaphoretic. No pallor.  Vitals  reviewed.   Lab Results  Component Value Date   WBC 9.1 10/30/2017   HGB 11.9 (L) 10/30/2017   HCT 35.1 (L) 10/30/2017   PLT 451.0 (H) 10/30/2017   GLUCOSE 100 (H) 09/26/2017   CHOL 130 09/26/2017   TRIG 209.0 (H) 09/26/2017   HDL 29.70 (L) 09/26/2017   LDLDIRECT 80.0 09/26/2017   LDLCALC 62 04/21/2015   ALT 22 09/26/2017   AST 23 09/26/2017   NA 139 09/26/2017   K 4.1 09/26/2017   CL 102 09/26/2017   CREATININE 0.93 09/26/2017   BUN 23 09/26/2017   CO2 29 09/26/2017   TSH 2.09 09/26/2017   HGBA1C 6.6 (H) 09/26/2017   MICROALBUR <0.7 09/26/2017    Mr Brain Wo Contrast  Result Date: 12/18/2016  [facility] [address] [place], [place] [address] [phone] NEUROIMAGING REPORT STUDY DATE: 12/15/2016 PATIENT NAME: [name] DOB: [birth year] MRN: [id number] EXAM: MRI Brain without contrast ORDERING CLINICIAN: Kathrynn Ducking M.D. CLINICAL HISTORY: 77 year old woman with memory loss COMPARISON FILMS: 12/14/2011 TECHNIQUE: MRI of the brain without contrast was obtained utilizing 5 mm axial slices with T1, T2, T2 flair, SWI and diffusion weighted views.  T1 sagittal and T2 coronal views were obtained. CONTRAST: none IMAGING SITE: [facility], [place], [place] FINDINGS: On sagittal images, the spinal cord is imaged caudally to C4 and is normal in caliber. Metal artifact, probably from ACDF,  is noted at C4.  The contents of the posterior fossa are of normal size and position.   The pituitary gland and optic chiasm appear normal.    There is moderate cortical atrophy most pronounced in the mesial temporal lobes. The atrophy has significantly progressed rest when compared to the 12/14/2011 MRI.  The ventricles are proportionately enlarged.  There are no abnormal extra-axial collections of fluid.  The cerebellum and brainstem appears normal.   The deep gray matter appears normal.  The hemispheres, there are T2/FLAIR hyperintense foci predominantly  in the periventricular and deep white matter distant with chronic microvessel ischemic change. When compared to the MRI of the brain dated 12/14/2011, this has progressed..  Diffusion weighted images are normal.  Susceptibility weighted images are normal.  The orbits appear normal.   The VIIth/VIIIth nerve complex appears normal.  The mastoid air cells appear normal.  The paranasal sinuses appear normal.  Flow voids are identified within the major intracerebral arteries.      This MRI of the brain without contrast shows the following: 1.    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 This has significantly progressed when compared to the MRI dated 12/14/2011. 2.    Chronic microvascular ischemic change, I'll progress we'll compared to the 12/14/2011 MRI. 3.    There are no acute findings. INTERPRETING PHYSICIAN: Richard A. Felecia Shelling, MD, PhD, FAAN Certified in  Neuroimaging by  [employer] [facility] [place]:   Kruti was seen today for anemia.  Diagnoses and all orders for this visit:  Thrombocytosis ([place])-  Her platelet count remains mildly elevated.  She has a mild but improving anemia.  The rest of her cell lines are normal.  She has no history of bleeding, bruising, or thrombophilia.  I will screen her for iron and B12 deficiency and will observe without intervention at this time. -     CBC with Differential/Platelet; Future -     Vitamin B12; Future -     IBC panel; Future -     Reticulocytes; Future -     Folate; Future -     Ferritin; Future -     Vitamin B1; Future  Deficiency anemia- Her H&H are slightly improved compared to a month ago.  I will screen her for vitamin deficiencies.  If she does not have a vitamin deficiency then this is likely the anemia of chronic disease/the elderly. -     CBC with Differential/Platelet; Future -     Vitamin B12; Future -     IBC panel; Future -     Reticulocytes; Future -     Folate; Future -      Ferritin; Future -     Vitamin B1; Future   I am having Tocarra J. Damron maintain her aspirin, metFORMIN, hydrochlorothiazide, levocetirizine, propranolol, simvastatin, donepezil, and irbesartan.  No orders of the defined types were placed in this encounter.    Follow-up: No follow-ups on file.  Scarlette Calico, MD

## 2017-10-31 ENCOUNTER — Encounter: Payer: Self-pay | Admitting: Internal Medicine

## 2017-10-31 NOTE — Patient Instructions (Signed)
Anemia Anemia is a condition in which you do not have enough red blood cells or hemoglobin. Hemoglobin is a substance in red blood cells that carries oxygen. When you do not have enough red blood cells or hemoglobin (are anemic), your body cannot get enough oxygen and your organs may not work properly. As a result, you may feel very tired or have other problems. What are the causes? Common causes of anemia include:  Excessive bleeding. Anemia can be caused by excessive bleeding inside or outside the body, including bleeding from the intestine or from periods in women.  Poor nutrition.  Long-lasting (chronic) kidney, thyroid, and liver disease.  Bone marrow disorders.  Cancer and treatments for cancer.  HIV (human immunodeficiency virus) and AIDS (acquired immunodeficiency syndrome).  Treatments for HIV and AIDS.  Spleen problems.  Blood disorders.  Infections, medicines, and autoimmune disorders that destroy red blood cells.  What are the signs or symptoms? Symptoms of this condition include:  Minor weakness.  Dizziness.  Headache.  Feeling heartbeats that are irregular or faster than normal (palpitations).  Shortness of breath, especially with exercise.  Paleness.  Cold sensitivity.  Indigestion.  Nausea.  Difficulty sleeping.  Difficulty concentrating.  Symptoms may occur suddenly or develop slowly. If your anemia is mild, you may not have symptoms. How is this diagnosed? This condition is diagnosed based on:  Blood tests.  Your medical history.  A physical exam.  Bone marrow biopsy.  Your health care provider may also check your stool (feces) for blood and may do additional testing to look for the cause of your bleeding. You may also have other tests, including:  Imaging tests, such as a CT scan or MRI.  Endoscopy.  Colonoscopy.  How is this treated? Treatment for this condition depends on the cause. If you continue to lose a lot of blood,  you may need to be treated at a hospital. Treatment may include:  Taking supplements of iron, vitamin B12, or folic acid.  Taking a hormone medicine (erythropoietin) that can help to stimulate red blood cell growth.  Having a blood transfusion. This may be needed if you lose a lot of blood.  Making changes to your diet.  Having surgery to remove your spleen.  Follow these instructions at home:  Take over-the-counter and prescription medicines only as told by your health care provider.  Take supplements only as told by your health care provider.  Follow any diet instructions that you were given.  Keep all follow-up visits as told by your health care provider. This is important. Contact a health care provider if:  You develop new bleeding anywhere in the body. Get help right away if:  You are very weak.  You are short of breath.  You have pain in your abdomen or chest.  You are dizzy or feel faint.  You have trouble concentrating.  You have bloody or black, tarry stools.  You vomit repeatedly or you vomit up blood. Summary  Anemia is a condition in which you do not have enough red blood cells or enough of a substance in your red blood cells that carries oxygen (hemoglobin).  Symptoms may occur suddenly or develop slowly.  If your anemia is mild, you may not have symptoms.  This condition is diagnosed with blood tests as well as a medical history and physical exam. Other tests may be needed.  Treatment for this condition depends on the cause of the anemia. This information is not intended to replace advice   given to you by your health care provider. Make sure you discuss any questions you have with your health care provider. Document Released: 06/15/2004 Document Revised: 06/09/2016 Document Reviewed: 06/09/2016 Elsevier Interactive Patient Education  [employer].

## 2017-11-03 LAB — RETICULOCYTES
ABS RETIC: 75240 {cells}/uL (ref 20000–8000)
RETIC CT PCT: 1.9 %

## 2017-11-03 LAB — VITAMIN B1: VITAMIN B1 (THIAMINE): 12 nmol/L (ref 8–30)

## 2017-11-06 ENCOUNTER — Other Ambulatory Visit: Payer: Self-pay | Admitting: Internal Medicine

## 2017-11-12 ENCOUNTER — Other Ambulatory Visit: Payer: Self-pay | Admitting: Internal Medicine

## 2017-11-12 DIAGNOSIS — I10 Essential (primary) hypertension: Secondary | ICD-10-CM

## 2017-12-03 ENCOUNTER — Ambulatory Visit (INDEPENDENT_AMBULATORY_CARE_PROVIDER_SITE_OTHER): Payer: Medicare Other | Admitting: Adult Health

## 2017-12-03 ENCOUNTER — Encounter: Payer: Self-pay | Admitting: Adult Health

## 2017-12-03 VITALS — BP 149/75 | HR 65 | Ht 62.0 in | Wt 141.5 lb

## 2017-12-03 DIAGNOSIS — I70213 Atherosclerosis of native arteries of extremities with intermittent claudication, bilateral legs: Secondary | ICD-10-CM

## 2017-12-03 DIAGNOSIS — R413 Other amnesia: Secondary | ICD-10-CM

## 2017-12-03 MED ORDER — DONEPEZIL HCL 10 MG PO TABS: 10.0000 mg | ORAL_TABLET | Freq: Every day | ORAL | 3 refills | Status: DC

## 2017-12-03 NOTE — Progress Notes (Signed)
PATIENT: TOPEKA GIAMMONA DOB: [birth year]  REASON FOR VISIT: follow up HISTORY FROM: patient  HISTORY OF PRESENT ILLNESS: Today 12/03/17:  Ms. Greenstein is a 77 year old female with a history of memory disturbance. she returns today for follow-up.  She is here today with her husband and her daughter.  She feels that her memory has remained stable.  She reports that she is able to complete all ADLs independently.  She denies any trouble sleeping.  She states that she has tolerated Aricept 5 mg at bedtime well.  She denies any significant side effects.  Her daughter reports that she has noticed that she is very repetitive.  She states that she has also noticed trouble with her managing her finances.  She also states on occasion her mom has reported waking up frequently at night.  The patient's husband shares some of the same concerns but does not find them as significant as her daughter.  Patient returns today for an evaluation.  HISTORY 06/05/17 Ms. Fukuda is a 77 year old female with a history of memory disturbance.  She returns today for follow-up.  She is currently not on any medication for her memory.  He feels that her memory has remained the same however daughter feels is gotten worse.  She continues to live at home with her husband.  She is able to complete all ADLs independently.  She does do some cooking however the daughter reports that she has deferred food at times.  She denies any trouble sleeping.  Denies any significant changes with her mood.  Over the holidays her daughter noticed that she did not handle crowds very well and became agitated.  She denies hallucinations although she does report that sometimes when she is going to sleep she feels as if she hears singing.  She is unsure if this is a hallucination or if it is real.  She returns today for an evaluation.   REVIEW OF SYSTEMS: Out of a complete 14 system review of symptoms, the patient complains only of the following symptoms, and  all other reviewed systems are negative.  See HPI  ALLERGIES: Allergies  Allergen Reactions  . Pneumococcal Vaccines Swelling    Per pt when received 2015  . Ampicillin Other (See Comments)    REACTION: causes urticaria  . Neomycin-Bacitracin Zn-Polymyx Rash  . Penicillins Other (See Comments)    REACTION: causes urticaria    HOME MEDICATIONS: Outpatient Medications Prior to Visit  Medication Sig Dispense Refill  . aspirin 81 MG EC tablet Take 1 tablet (81 mg total) by mouth daily. Swallow whole. 30 tablet 12  . donepezil (ARICEPT) 5 MG tablet TAKE 1 TABLET(5 MG) BY MOUTH AT BEDTIME 90 tablet 0  . hydrochlorothiazide (HYDRODIURIL) 25 MG tablet Take 1 tablet (25 mg total) by mouth daily. 90 tablet 0  . irbesartan (AVAPRO) 150 MG tablet Take 1 tablet (150 mg total) by mouth daily. 90 tablet 1  . levocetirizine (XYZAL) 5 MG tablet Take 1 tablet (5 mg total) by mouth every evening. 90 tablet 1  . metFORMIN (GLUCOPHAGE) 500 MG tablet Take 1 tablet (500 mg total) by mouth 2 (two) times daily with a meal. 180 tablet 0  . propranolol (INDERAL) 20 MG tablet TAKE 1 TABLET(20 MG) BY MOUTH TWICE DAILY 180 tablet 1  . simvastatin (ZOCOR) 20 MG tablet TAKE 1 TABLET(20 MG) BY MOUTH DAILY AT 6:00 PM 90 tablet 1  . simvastatin (ZOCOR) 20 MG tablet TAKE 1 TABLET(20 MG) BY MOUTH DAILY AT  6:00 PM 90 tablet 1   No facility-administered medications prior to visit.     PAST MEDICAL HISTORY: Past Medical History:  Diagnosis Date  . Contact dermatitis and other eczema, due to unspecified cause   . Displacement of lumbar intervertebral disc without myelopathy   . FH: cataracts   . Gout, unspecified   . Memory loss   . Occlusion and stenosis of carotid artery without mention of cerebral infarction   . Other and unspecified hyperlipidemia   . Pain in joint, shoulder region   . Pain in limb   . Routine general medical examination at a health care facility   . Sciatica   . Type II or unspecified type  diabetes mellitus without mention of complication, uncontrolled   . Unspecified essential hypertension   . Unspecified venous (peripheral) insufficiency     PAST SURGICAL HISTORY: Past Surgical History:  Procedure Laterality Date  . ANTERIOR CERVICAL DECOMP/DISCECTOMY FUSION N/A 12/22/2014   Procedure: Anterior Cervical Four-Five/Five-Six/Six-Seven Decompression/Diskectomy/ and Fsion;  Surgeon: Leeroy Cha, MD;  Location: [facility];  Service: Neurosurgery;  Laterality: N/A;  C4-5 C5-6 C6-7 Anterior cervical decompression/diskectomy/fusion  . LAPAROSCOPIC APPENDECTOMY    . LUMBAR LAMINECTOMY  07/2008   [place]  . TUBAL LIGATION      FAMILY HISTORY: Family History  Problem Relation Age of Onset  . Alzheimer's disease Mother   . Diabetes Mother   . Coronary artery disease Father        CABG  . Hypertension Father   . Cancer Brother   . Cancer Brother   . Colon cancer Neg Hx   . Breast cancer Neg Hx     SOCIAL HISTORY: Social History   Socioeconomic History  . Marital status: Married    Spouse name: Laverna Peace  . Number of children: 3  . Years of education: 48  . Highest education level: Not on file  Occupational History  . Not on file  Social Needs  . Financial resource strain: Not on file  . Food insecurity:    Worry: Not on file    Inability: Not on file  . Transportation needs:    Medical: Not on file    Non-medical: Not on file  Tobacco Use  . Smoking status: Former Smoker    Last attempt to quit: 05/23/1995    Years since quitting: 22.5  . Smokeless tobacco: Never Used  Substance and Sexual Activity  . Alcohol use: No  . Drug use: No  . Sexual activity: Yes    Partners: Male  Lifestyle  . Physical activity:    Days per week: Not on file    Minutes per session: Not on file  . Stress: Not on file  Relationships  . Social connections:    Talks on phone: Not on file    Gets together: Not on file    Attends religious service: Not on file    Active member  of club or organization: Not on file    Attends meetings of clubs or organizations: Not on file    Relationship status: Not on file  . Intimate partner violence:    Fear of current or ex partner: Not on file    Emotionally abused: Not on file    Physically abused: Not on file    Forced sexual activity: Not on file  Other Topics Concern  . Not on file  Social History Narrative   HSG.  Married '59.  2 sons - '65, '62; 1 dtr '  61; 7 grandchildren; 3 great-grandchildren.  Lives w/husband in own home.  END OF LIFE: she does not want to be kept alive on machines. Needs more information in regard to CPR, etc. Provided w/[employer] guide and MOST (7/09)      PHYSICAL EXAM  Vitals:   12/03/17 0756  BP: (!) 149/75  Pulse: 65  Weight: 141 lb 8 oz (64.2 kg)  Height: 5\' 2"  (1.575 m)   Body mass index is 25.88 kg/m.  MMSE - Mini Mental State Exam 12/03/2017 06/05/2017 11/27/2016  Orientation to time 4 4 4   Orientation to Place 5 4 4   Registration 3 3 3   Attention/ Calculation 5 0 0  Recall 0 0 0  Language- name 2 objects 2 2 2   Language- repeat 1 1 1   Language- follow 3 step command 3 3 3   Language- read & follow direction 1 1 1   Write a sentence 1 1 0  Copy design 1 1 1   Total score 26 20 19      Generalized: Well developed, in no acute distress   Neurological examination  Mentation: Alert oriented to time, place, history taking. Follows all commands speech and language fluent Cranial nerve II-XII: Pupils were equal round reactive to light. Extraocular movements were full, visual field were full on confrontational test. Facial sensation and strength were normal. Uvula tongue midline. Head turning and shoulder shrug  were normal and symmetric. Motor: The motor testing reveals 5 over 5 strength of all 4 extremities. Good symmetric motor tone is noted throughout.  Sensory: Sensory testing is intact to soft touch on all 4 extremities. No evidence of extinction is noted.  Coordination:  Cerebellar testing reveals good finger-nose-finger and heel-to-shin bilaterally.  Gait and station: Gait is normal.  Reflexes: Deep tendon reflexes are symmetric and normal bilaterally.   DIAGNOSTIC DATA (LABS, IMAGING, TESTING) - I reviewed patient records, labs, notes, testing and imaging myself where available.  Lab Results  Component Value Date   WBC 9.1 10/30/2017   HGB 11.9 (L) 10/30/2017   HCT 35.1 (L) 10/30/2017   MCV 87.5 10/30/2017   PLT 451.0 (H) 10/30/2017      Component Value Date/Time   NA 139 09/26/2017 1127   K 4.1 09/26/2017 1127   CL 102 09/26/2017 1127   CO2 29 09/26/2017 1127   GLUCOSE 100 (H) 09/26/2017 1127   BUN 23 09/26/2017 1127   CREATININE 0.93 09/26/2017 1127   CALCIUM 9.7 09/26/2017 1127   PROT 7.3 09/26/2017 1127   ALBUMIN 4.1 09/26/2017 1127   AST 23 09/26/2017 1127   ALT 22 09/26/2017 1127   ALKPHOS 86 09/26/2017 1127   BILITOT 1.0 09/26/2017 1127   GFRNONAA 59 (L) 12/24/2014 0450   GFRAA >60 12/24/2014 0450   Lab Results  Component Value Date   CHOL 130 09/26/2017   HDL 29.70 (L) 09/26/2017   LDLCALC 62 04/21/2015   LDLDIRECT 80.0 09/26/2017   TRIG 209.0 (H) 09/26/2017   CHOLHDL 4 09/26/2017   Lab Results  Component Value Date   HGBA1C 6.6 (H) 09/26/2017   Lab Results  Component Value Date   VITAMINB12 602 10/30/2017   Lab Results  Component Value Date   TSH 2.09 09/26/2017      ASSESSMENT AND PLAN 77 y.o. year old female  has a past medical history of Contact dermatitis and other eczema, due to unspecified cause, Displacement of lumbar intervertebral disc without myelopathy, FH: cataracts, Gout, unspecified, Memory loss, Occlusion and stenosis of carotid artery without  mention of cerebral infarction, Other and unspecified hyperlipidemia, Pain in joint, shoulder region, Pain in limb, Routine general medical examination at a health care facility, Sciatica, Type II or unspecified type diabetes mellitus without mention of  complication, uncontrolled, Unspecified essential hypertension, and Unspecified venous (peripheral) insufficiency. here with   1.  Memory disturbance:   The patient's memory score has improved since the last visit.  I will increase Aricept to 10 mg at bedtime. If she is unable to tolerate the increase in medication she will let us know.  We will continue to monitor the memory.  She will follow-up in 6 months or sooner if needed.   I spent 15 minutes with the patient. 50% of this time was spent discussing the patient's diagnosis and treatment plan with the patient and her family.   Ward Givens, MSN, NP-C 12/03/2017, 8:10 AM [facility] [address] [place],  [address] [phone]

## 2017-12-03 NOTE — Patient Instructions (Signed)
Your Plan:  Increase Arciept to 10 mg at bedtime Memory score is stable If your symptoms worsen or you develop new symptoms please let us know.   Thank you for coming to see [place] at [facility]. I hope we have been able to provide you high quality care today.  You may receive a patient satisfaction survey over the next few weeks. We would appreciate your feedback and comments so that we may continue to improve ourselves and the health of our patients.

## 2017-12-09 NOTE — Progress Notes (Signed)
I have read the note, and I agree with the clinical assessment and plan.  Charles K Willis   

## 2017-12-11 ENCOUNTER — Ambulatory Visit (INDEPENDENT_AMBULATORY_CARE_PROVIDER_SITE_OTHER): Payer: Medicare Other | Admitting: Internal Medicine

## 2017-12-11 ENCOUNTER — Encounter: Payer: Self-pay | Admitting: Internal Medicine

## 2017-12-11 VITALS — BP 170/88 | HR 70 | Temp 97.9°F | Resp 16 | Ht 62.0 in | Wt 144.2 lb

## 2017-12-11 DIAGNOSIS — I70213 Atherosclerosis of native arteries of extremities with intermittent claudication, bilateral legs: Secondary | ICD-10-CM | POA: Diagnosis not present

## 2017-12-11 DIAGNOSIS — E1140 Type 2 diabetes mellitus with diabetic neuropathy, unspecified: Secondary | ICD-10-CM | POA: Diagnosis not present

## 2017-12-11 DIAGNOSIS — D75839 Thrombocytosis, unspecified: Secondary | ICD-10-CM

## 2017-12-11 DIAGNOSIS — D638 Anemia in other chronic diseases classified elsewhere: Secondary | ICD-10-CM | POA: Diagnosis not present

## 2017-12-11 DIAGNOSIS — D473 Essential (hemorrhagic) thrombocythemia: Secondary | ICD-10-CM

## 2017-12-11 DIAGNOSIS — I10 Essential (primary) hypertension: Secondary | ICD-10-CM | POA: Diagnosis not present

## 2017-12-11 NOTE — Patient Instructions (Signed)

## 2017-12-11 NOTE — Progress Notes (Signed)
Subjective:  Patient ID: [name], female    DOB: 06/25/1940  Age: 77 y.o. MRN: [id number]  CC: Hypertension; Anemia; and Diabetes   HPI [name] presents for f/up - she is concerned that her BP is not well controlled. She admits that she forgets to take some of her meds. She is NOT willing to let a [place] or SW help her with this.  Outpatient Medications Prior to Visit  Medication Sig Dispense Refill  . aspirin 81 MG EC tablet Take 1 tablet (81 mg total) by mouth daily. Swallow whole. 30 tablet 12  . donepezil (ARICEPT) 10 MG tablet Take 1 tablet (10 mg total) by mouth at bedtime. 90 tablet 3  . hydrochlorothiazide (HYDRODIURIL) 25 MG tablet Take 1 tablet (25 mg total) by mouth daily. 90 tablet 0  . irbesartan (AVAPRO) 150 MG tablet Take 1 tablet (150 mg total) by mouth daily. 90 tablet 1  . levocetirizine (XYZAL) 5 MG tablet Take 1 tablet (5 mg total) by mouth every evening. 90 tablet 1  . metFORMIN (GLUCOPHAGE) 500 MG tablet Take 1 tablet (500 mg total) by mouth 2 (two) times daily with a meal. 180 tablet 0  . propranolol (INDERAL) 20 MG tablet TAKE 1 TABLET(20 MG) BY MOUTH TWICE DAILY 180 tablet 1  . simvastatin (ZOCOR) 20 MG tablet TAKE 1 TABLET(20 MG) BY MOUTH DAILY AT 6:00 PM 90 tablet 1   No facility-administered medications prior to visit.     ROS Review of Systems  Constitutional: Negative for diaphoresis and fatigue.  HENT: Negative.   Eyes: Negative for pain and visual disturbance.  Respiratory: Negative for cough, chest tightness, shortness of breath and wheezing.   Cardiovascular: Negative for chest pain, palpitations and leg swelling.  Gastrointestinal: Negative for abdominal pain, diarrhea, nausea and vomiting.  Genitourinary: Negative.  Negative for difficulty urinating.  Musculoskeletal: Negative.   Skin: Negative.   Neurological: Negative.  Negative for dizziness, weakness and light-headedness.  Hematological: Negative for adenopathy. Does not  bruise/bleed easily.  Psychiatric/Behavioral: Positive for confusion and decreased concentration. Negative for sleep disturbance and suicidal ideas. The patient is not nervous/anxious.     Objective:  BP (!) 170/88 (BP Location: Left Arm, Patient Position: Sitting, Cuff Size: Normal)   Pulse 70   Temp 97.9 F (36.6 C) (Oral)   Resp 16   Ht 5\' 2"  (1.575 m)   Wt 144 lb 4 oz (65.4 kg)   SpO2 97%   BMI 26.38 kg/m   BP Readings from Last 3 Encounters:  12/11/17 (!) 170/88  12/03/17 (!) 149/75  10/30/17 132/70    Wt Readings from Last 3 Encounters:  12/11/17 144 lb 4 oz (65.4 kg)  12/03/17 141 lb 8 oz (64.2 kg)  10/30/17 142 lb (64.4 kg)    Physical Exam  Constitutional: She is oriented to person, place, and time. No distress.  HENT:  Mouth/Throat: Oropharynx is clear and moist. No oropharyngeal exudate.  Eyes: Conjunctivae are normal. No scleral icterus.  Neck: Normal range of motion. Neck supple. No JVD present. No thyromegaly present.  Cardiovascular: Normal rate, regular rhythm and normal heart sounds. Exam reveals no gallop.  No murmur heard. Pulmonary/Chest: Effort normal and breath sounds normal. She has no wheezes. She has no rhonchi. She has no rales.  Abdominal: Soft. Bowel sounds are normal. She exhibits no mass. There is no hepatosplenomegaly. There is no tenderness.  Musculoskeletal: Normal range of motion. She exhibits no edema, tenderness or deformity.  Lymphadenopathy:    She has no cervical adenopathy.  Neurological: She is alert and oriented to person, place, and time.  Skin: Skin is warm and dry. She is not diaphoretic. No pallor.  Psychiatric: She has a normal mood and affect. Her behavior is normal. Judgment and thought content normal.  Vitals reviewed.   Lab Results  Component Value Date   WBC 9.1 10/30/2017   HGB 11.9 (L) 10/30/2017   HCT 35.1 (L) 10/30/2017   PLT 451.0 (H) 10/30/2017   GLUCOSE 100 (H) 09/26/2017   CHOL 130 09/26/2017   TRIG  209.0 (H) 09/26/2017   HDL 29.70 (L) 09/26/2017   LDLDIRECT 80.0 09/26/2017   LDLCALC 62 04/21/2015   ALT 22 09/26/2017   AST 23 09/26/2017   NA 139 09/26/2017   K 4.1 09/26/2017   CL 102 09/26/2017   CREATININE 0.93 09/26/2017   BUN 23 09/26/2017   CO2 29 09/26/2017   TSH 2.09 09/26/2017   HGBA1C 6.6 (H) 09/26/2017   MICROALBUR <0.7 09/26/2017    Mr Brain Wo Contrast  Result Date: 12/18/2016  [facility] [address] [place], [place] [address] [phone] NEUROIMAGING REPORT STUDY DATE: 12/15/2016 PATIENT NAME: [name] DOB: [birth year] MRN: [id number] EXAM: MRI Brain without contrast ORDERING CLINICIAN: Kathrynn Ducking M.D. CLINICAL HISTORY: 77 year old woman with memory loss COMPARISON FILMS: 12/14/2011 TECHNIQUE: MRI of the brain without contrast was obtained utilizing 5 mm axial slices with T1, T2, T2 flair, SWI and diffusion weighted views.  T1 sagittal and T2 coronal views were obtained. CONTRAST: none IMAGING SITE: [place] [facility], [place], [place] FINDINGS: On sagittal images, the spinal cord is imaged caudally to C4 and is normal in caliber. Metal artifact, probably from ACDF,  is noted at C4.  The contents of the posterior fossa are of normal size and position.   The pituitary gland and optic chiasm appear normal.    There is moderate cortical atrophy most pronounced in the mesial temporal lobes. The atrophy has significantly progressed rest when compared to the 12/14/2011 MRI.  The ventricles are proportionately enlarged.  There are no abnormal extra-axial collections of fluid.  The cerebellum and brainstem appears normal.   The deep gray matter appears normal.  The hemispheres, there are T2/FLAIR hyperintense foci predominantly in the periventricular and deep white matter distant with chronic microvessel ischemic change. When compared to the MRI of the brain dated 12/14/2011, this has progressed..  Diffusion weighted images are normal.   Susceptibility weighted images are normal.  The orbits appear normal.   The VIIth/VIIIth nerve complex appears normal.  The mastoid air cells appear normal.  The paranasal sinuses appear normal.  Flow voids are identified within the major intracerebral arteries.      This MRI of the brain without contrast shows the following: 1.    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 This has significantly progressed when compared to the MRI dated 12/14/2011. 2.    Chronic microvascular ischemic change, I'll progress we'll compared to the 12/14/2011 MRI. 3.    There are no acute findings. INTERPRETING PHYSICIAN: Richard A. Felecia Shelling, MD, PhD, FAAN Certified in  Neuroimaging by [employer]    Assessment & Plan:   [name] was seen today for hypertension, anemia and diabetes.  Diagnoses and all orders for this visit:  Anemia, chronic disease- Her H&H remain stable.  Recent screening for vitamin deficiencies was unremarkable.  I will continue to  monitor this.  Atherosclerosis of native artery of both lower extremities with intermittent claudication ([place])- She has no symptoms related to this.  Controlled type 2 diabetes mellitus with diabetic neuropathy, without long-term current use of insulin ([place])- Her recent A1c was 6.6%.  Her blood sugars are adequately well controlled.  Thrombocytosis ([place])- She is mildly anemic but her other cell lines are normal.  Recent screening for B12 and iron deficiency was unremarkable.  At this time the elevated platelet count appears to be a benign entity.  I will follow this in the future to monitor for lymphoproliferative disease.   I am having [name] maintain her aspirin, metFORMIN, hydrochlorothiazide, levocetirizine, simvastatin, irbesartan, propranolol, and donepezil.  No orders of the defined types were placed in this encounter.    Follow-up: Return in about 6 months (around 06/13/2018).  Scarlette Calico,  MD

## 2017-12-13 NOTE — Assessment & Plan Note (Signed)
Her blood pressure is not adequately well controlled. She agrees to be more compliant with the ARB and thiazide diuretic.

## 2018-01-10 DIAGNOSIS — E119 Type 2 diabetes mellitus without complications: Secondary | ICD-10-CM | POA: Diagnosis not present

## 2018-01-10 DIAGNOSIS — H524 Presbyopia: Secondary | ICD-10-CM | POA: Diagnosis not present

## 2018-01-10 DIAGNOSIS — H5213 Myopia, bilateral: Secondary | ICD-10-CM | POA: Diagnosis not present

## 2018-01-10 DIAGNOSIS — Z7984 Long term (current) use of oral hypoglycemic drugs: Secondary | ICD-10-CM | POA: Diagnosis not present

## 2018-01-10 DIAGNOSIS — H52221 Regular astigmatism, right eye: Secondary | ICD-10-CM | POA: Diagnosis not present

## 2018-01-10 LAB — HM DIABETES EYE EXAM

## 2018-01-21 ENCOUNTER — Other Ambulatory Visit: Payer: Self-pay | Admitting: Internal Medicine

## 2018-01-21 DIAGNOSIS — E1140 Type 2 diabetes mellitus with diabetic neuropathy, unspecified: Secondary | ICD-10-CM

## 2018-01-28 DIAGNOSIS — L308 Other specified dermatitis: Secondary | ICD-10-CM | POA: Diagnosis not present

## 2018-01-29 ENCOUNTER — Encounter: Payer: Self-pay | Admitting: Internal Medicine

## 2018-02-20 ENCOUNTER — Ambulatory Visit (INDEPENDENT_AMBULATORY_CARE_PROVIDER_SITE_OTHER): Payer: Medicare Other

## 2018-02-20 DIAGNOSIS — Z23 Encounter for immunization: Secondary | ICD-10-CM

## 2018-04-01 ENCOUNTER — Ambulatory Visit (INDEPENDENT_AMBULATORY_CARE_PROVIDER_SITE_OTHER): Payer: Medicare Other | Admitting: Internal Medicine

## 2018-04-01 ENCOUNTER — Other Ambulatory Visit (INDEPENDENT_AMBULATORY_CARE_PROVIDER_SITE_OTHER): Payer: Medicare Other

## 2018-04-01 ENCOUNTER — Encounter: Payer: Self-pay | Admitting: Internal Medicine

## 2018-04-01 VITALS — BP 164/78 | HR 96 | Temp 97.9°F | Resp 16 | Ht 62.0 in | Wt 143.0 lb

## 2018-04-01 DIAGNOSIS — D473 Essential (hemorrhagic) thrombocythemia: Secondary | ICD-10-CM

## 2018-04-01 DIAGNOSIS — D638 Anemia in other chronic diseases classified elsewhere: Secondary | ICD-10-CM | POA: Diagnosis not present

## 2018-04-01 DIAGNOSIS — I10 Essential (primary) hypertension: Secondary | ICD-10-CM | POA: Diagnosis not present

## 2018-04-01 DIAGNOSIS — E1140 Type 2 diabetes mellitus with diabetic neuropathy, unspecified: Secondary | ICD-10-CM | POA: Diagnosis not present

## 2018-04-01 DIAGNOSIS — D75839 Thrombocytosis, unspecified: Secondary | ICD-10-CM

## 2018-04-01 DIAGNOSIS — I70213 Atherosclerosis of native arteries of extremities with intermittent claudication, bilateral legs: Secondary | ICD-10-CM | POA: Diagnosis not present

## 2018-04-01 LAB — BASIC METABOLIC PANEL
BUN: 23 mg/dL (ref 6–23)
CALCIUM: 10 mg/dL (ref 8.4–10.5)
CO2: 28 meq/L (ref 19–32)
Chloride: 102 mEq/L (ref 96–112)
Creatinine, Ser: 1.01 mg/dL (ref 0.40–1.20)
GFR: 56.28 mL/min — AB (ref >60.00)
GLUCOSE: 145 mg/dL — AB (ref 70–99)
Potassium: 4.3 mEq/L (ref 3.5–5.1)
SODIUM: 140 meq/L (ref 135–145)

## 2018-04-01 LAB — CBC WITH DIFFERENTIAL/PLATELET
BASOS ABS: 0 10*3/uL (ref 0.0–0.1)
Basophils Relative: 0.5 % (ref 0.0–3.0)
EOS ABS: 0.4 10*3/uL (ref 0.0–0.7)
Eosinophils Relative: 4.2 % (ref 0.0–5.0)
HCT: 37.3 % (ref 36.0–46.0)
Hemoglobin: 12.3 g/dL (ref 12.0–15.0)
LYMPHS ABS: 2.4 10*3/uL (ref 0.7–4.0)
LYMPHS PCT: 29.6 % (ref 12.0–46.0)
MCHC: 33 g/dL (ref 30.0–36.0)
MCV: 87.7 fl (ref 78.0–100.0)
Monocytes Absolute: 0.7 10*3/uL (ref 0.1–1.0)
Monocytes Relative: 8.1 % (ref 3.0–12.0)
NEUTROS ABS: 4.8 10*3/uL (ref 1.4–7.7)
NEUTROS PCT: 57.6 % (ref 43.0–77.0)
PLATELETS: 398 10*3/uL (ref 150.0–400.0)
RBC: 4.25 Mil/uL (ref 3.87–5.11)
RDW: 14.2 % (ref 11.5–15.5)
WBC: 8.3 10*3/uL (ref 4.0–10.5)

## 2018-04-01 LAB — HEMOGLOBIN A1C: HEMOGLOBIN A1C: 6.6 % — AB (ref 4.6–6.5)

## 2018-04-01 NOTE — Patient Instructions (Signed)

## 2018-04-01 NOTE — Progress Notes (Signed)
Subjective:  Patient ID: [name], female    DOB: 1940/07/30  Age: 77 y.o. MRN: [id number]  CC: Anemia; Hypertension; and Diabetes   HPI [name] presents for f/up - She is being followed for chronic anemia.  She also has history of elevated platelet count.  Her vitamin levels were all normal about 4 months ago.  She is not taking her meds for hypertension or diabetes.  She said she forgets to.  Her husband is helping her but neither of them has been very successful.  She feels well today and offers no complaints.  Outpatient Medications Prior to Visit  Medication Sig Dispense Refill  . aspirin 81 MG EC tablet Take 1 tablet (81 mg total) by mouth daily. Swallow whole. 30 tablet 12  . donepezil (ARICEPT) 10 MG tablet Take 1 tablet (10 mg total) by mouth at bedtime. 90 tablet 3  . hydrochlorothiazide (HYDRODIURIL) 25 MG tablet Take 1 tablet (25 mg total) by mouth daily. 90 tablet 0  . irbesartan (AVAPRO) 150 MG tablet Take 1 tablet (150 mg total) by mouth daily. 90 tablet 1  . levocetirizine (XYZAL) 5 MG tablet Take 1 tablet (5 mg total) by mouth every evening. 90 tablet 1  . metFORMIN (GLUCOPHAGE) 500 MG tablet TAKE 1 TABLET(500 MG) BY MOUTH TWICE DAILY WITH A MEAL 180 tablet 0  . propranolol (INDERAL) 20 MG tablet TAKE 1 TABLET(20 MG) BY MOUTH TWICE DAILY 180 tablet 1  . simvastatin (ZOCOR) 20 MG tablet TAKE 1 TABLET(20 MG) BY MOUTH DAILY AT 6:00 PM 90 tablet 1   No facility-administered medications prior to visit.     ROS Review of Systems  Constitutional: Negative.  Negative for diaphoresis, fatigue and unexpected weight change.  HENT: Negative.   Eyes: Negative for visual disturbance.  Respiratory: Negative for chest tightness, shortness of breath and wheezing.   Cardiovascular: Negative for chest pain, palpitations and leg swelling.  Gastrointestinal: Negative for abdominal pain, constipation, diarrhea, nausea and vomiting.  Endocrine: Negative for polydipsia, polyphagia  and polyuria.  Genitourinary: Negative.  Negative for decreased urine volume and difficulty urinating.  Musculoskeletal: Negative.  Negative for arthralgias, back pain, myalgias and neck pain.  Skin: Negative for color change and pallor.  Neurological: Negative.  Negative for dizziness, weakness and light-headedness.  Hematological: Negative for adenopathy. Does not bruise/bleed easily.  Psychiatric/Behavioral: Positive for behavioral problems, confusion and decreased concentration. Negative for agitation, self-injury, sleep disturbance and suicidal ideas. The patient is not nervous/anxious.     Objective:  BP (!) 164/78 (BP Location: Left Arm, Patient Position: Sitting, Cuff Size: Normal)   Pulse 96   Temp 97.9 F (36.6 C) (Oral)   Resp 16   Ht 5\' 2"  (1.575 m)   Wt 143 lb (64.9 kg)   SpO2 99%   BMI 26.16 kg/m   BP Readings from Last 3 Encounters:  04/01/18 (!) 164/78  12/11/17 (!) 170/88  12/03/17 (!) 149/75    Wt Readings from Last 3 Encounters:  04/01/18 143 lb (64.9 kg)  12/11/17 144 lb 4 oz (65.4 kg)  12/03/17 141 lb 8 oz (64.2 kg)    Physical Exam  Constitutional: She is oriented to person, place, and time. No distress.  HENT:  Mouth/Throat: Oropharynx is clear and moist. No oropharyngeal exudate.  Eyes: Conjunctivae are normal. No scleral icterus.  Neck: Normal range of motion. Neck supple. No JVD present. No thyromegaly present.  Cardiovascular: Normal rate, regular rhythm and normal heart sounds. Exam reveals  no gallop.  No murmur heard. Pulmonary/Chest: Breath sounds normal. No respiratory distress. She has no wheezes. She has no rales.  Abdominal: Soft. Bowel sounds are normal. She exhibits no mass. There is no hepatosplenomegaly. There is no tenderness.  Musculoskeletal: Normal range of motion. She exhibits no edema, tenderness or deformity.  Lymphadenopathy:    She has no cervical adenopathy.  Neurological: She is alert and oriented to person, place, and  time.  Skin: Skin is warm and dry. She is not diaphoretic. No pallor.  Psychiatric: She has a normal mood and affect. Her behavior is normal. Judgment and thought content normal.  Vitals reviewed.   Lab Results  Component Value Date   WBC 8.3 04/01/2018   HGB 12.3 04/01/2018   HCT 37.3 04/01/2018   PLT 398.0 04/01/2018   GLUCOSE 145 (H) 04/01/2018   CHOL 130 09/26/2017   TRIG 209.0 (H) 09/26/2017   HDL 29.70 (L) 09/26/2017   LDLDIRECT 80.0 09/26/2017   LDLCALC 62 04/21/2015   ALT 22 09/26/2017   AST 23 09/26/2017   NA 140 04/01/2018   K 4.3 04/01/2018   CL 102 04/01/2018   CREATININE 1.01 04/01/2018   BUN 23 04/01/2018   CO2 28 04/01/2018   TSH 2.09 09/26/2017   HGBA1C 6.6 (H) 04/01/2018   MICROALBUR <0.7 09/26/2017    Mr Brain Wo Contrast  Result Date: 12/18/2016  [facility] [address], [place], [place] [address] [phone] NEUROIMAGING REPORT STUDY DATE: 12/15/2016 PATIENT NAME: [name] DOB: January 18, 1941 MRN: [id number] EXAM: MRI Brain without contrast ORDERING CLINICIAN: Kathrynn Ducking M.D. CLINICAL HISTORY: 77 year old woman with memory loss COMPARISON FILMS: 12/14/2011 TECHNIQUE: MRI of the brain without contrast was obtained utilizing 5 mm axial slices with T1, T2, T2 flair, SWI and diffusion weighted views.  T1 sagittal and T2 coronal views were obtained. CONTRAST: none IMAGING SITE:  imaging, [place], [place] FINDINGS: On sagittal images, the spinal cord is imaged caudally to C4 and is normal in caliber. Metal artifact, probably from ACDF,  is noted at C4.  The contents of the posterior fossa are of normal size and position.   The pituitary gland and optic chiasm appear normal.    There is moderate cortical atrophy most pronounced in the mesial temporal lobes. The atrophy has significantly progressed rest when compared to the 12/14/2011 MRI.  The ventricles are proportionately enlarged.  There are no abnormal  extra-axial collections of fluid.  The cerebellum and brainstem appears normal.   The deep gray matter appears normal.  The hemispheres, there are T2/FLAIR hyperintense foci predominantly in the periventricular and deep white matter distant with chronic microvessel ischemic change. When compared to the MRI of the brain dated 12/14/2011, this has progressed..  Diffusion weighted images are normal.  Susceptibility weighted images are normal.  The orbits appear normal.   The VIIth/VIIIth nerve complex appears normal.  The mastoid air cells appear normal.  The paranasal sinuses appear normal.  Flow voids are identified within the major intracerebral arteries.      This MRI of the brain without contrast shows the following: 1.    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 This has significantly progressed when compared to the MRI dated 12/14/2011. 2.    Chronic microvascular ischemic change, I'll progress we'll compared to the 12/14/2011 MRI. 3.    There are no acute findings. INTERPRETING PHYSICIAN: Richard A. Felecia Shelling, MD, PhD, FAAN Certified in  Neuroimaging  by [employer]    Assessment & Plan:   [name] was seen today for anemia, hypertension and diabetes.  Diagnoses and all orders for this visit:  Essential hypertension- Her blood pressure is not adequately well controlled due to noncompliance.  She agrees to start taking the ARB as directed. -     Basic metabolic panel; Future  Anemia, chronic disease- Her H/H are normal now -     CBC with Differential/Platelet; Future  Thrombocytosis ([place])- Her PLT ct is normal now -     CBC with Differential/Platelet; Future  Controlled type 2 diabetes mellitus with diabetic neuropathy, without long-term current use of insulin ([place])- Her blood sugars are adequately well controlled. -     Basic metabolic panel; Future -     Hemoglobin A1c; Future   I am having Meghann J. Duff maintain her aspirin,  hydrochlorothiazide, levocetirizine, simvastatin, irbesartan, propranolol, donepezil, and metFORMIN.  No orders of the defined types were placed in this encounter.    Follow-up: Return in about 6 months (around 09/30/2018).  Scarlette Calico, MD

## 2018-04-04 ENCOUNTER — Other Ambulatory Visit: Payer: Self-pay | Admitting: Internal Medicine

## 2018-04-04 DIAGNOSIS — E1140 Type 2 diabetes mellitus with diabetic neuropathy, unspecified: Secondary | ICD-10-CM

## 2018-04-04 DIAGNOSIS — I10 Essential (primary) hypertension: Secondary | ICD-10-CM

## 2018-04-06 ENCOUNTER — Other Ambulatory Visit: Payer: Self-pay | Admitting: Internal Medicine

## 2018-04-06 DIAGNOSIS — I10 Essential (primary) hypertension: Secondary | ICD-10-CM

## 2018-04-23 ENCOUNTER — Other Ambulatory Visit: Payer: Self-pay | Admitting: Internal Medicine

## 2018-04-23 DIAGNOSIS — E1140 Type 2 diabetes mellitus with diabetic neuropathy, unspecified: Secondary | ICD-10-CM

## 2018-05-02 ENCOUNTER — Other Ambulatory Visit: Payer: Self-pay | Admitting: Internal Medicine

## 2018-05-26 ENCOUNTER — Other Ambulatory Visit: Payer: Self-pay | Admitting: Internal Medicine

## 2018-05-26 DIAGNOSIS — H6981 Other specified disorders of Eustachian tube, right ear: Secondary | ICD-10-CM

## 2018-05-26 DIAGNOSIS — I10 Essential (primary) hypertension: Secondary | ICD-10-CM

## 2018-05-29 ENCOUNTER — Encounter: Payer: Self-pay | Admitting: Adult Health

## 2018-06-06 ENCOUNTER — Ambulatory Visit (INDEPENDENT_AMBULATORY_CARE_PROVIDER_SITE_OTHER): Payer: Medicare Other | Admitting: Adult Health

## 2018-06-06 ENCOUNTER — Encounter: Payer: Self-pay | Admitting: Adult Health

## 2018-06-06 VITALS — BP 162/81 | HR 79 | Ht 62.0 in | Wt 144.2 lb

## 2018-06-06 DIAGNOSIS — R200 Anesthesia of skin: Secondary | ICD-10-CM | POA: Diagnosis not present

## 2018-06-06 DIAGNOSIS — F0391 Unspecified dementia with behavioral disturbance: Secondary | ICD-10-CM | POA: Diagnosis not present

## 2018-06-06 MED ORDER — MEMANTINE HCL 5 MG PO TABS: 5.0000 mg | ORAL_TABLET | Freq: Every day | ORAL | 5 refills | Status: DC

## 2018-06-06 NOTE — Progress Notes (Signed)
PATIENT: [name] DOB: 06/13/1940  REASON FOR VISIT: follow up HISTORY FROM: patient  HISTORY OF PRESENT ILLNESS: Today 06/06/18  She presents today for follow up. At her last visit her MMSE was 26. Today it is 23. She has tolerated increased dose of Aricept (10mg ). Her daughter is with her today and aides in history. She is living at home with her husband. She is doing all ADL's.  Her son goes every week to dose medications. There are days when she leaves her medications in her container. There are some concern of whether she is taking her medications. She is still driving. Her daughter reports that they are installing a GPS system in her car. She has not had any trouble with accidents or getting lost at this point.  Her husband is doing most of the finances.  Her daughter reports that she does forget to cut the stove off and her husband usually watches behind her. She is much more irritable that she was previously. Her daughter states that there are nights where she is yelling out to her husband. She is not sleeping well.   She does also complain about right sided numbness in her thumb and 2nd, 3rd digits.  She has had carpal tunnel surgery in the past.. She also had neck surgery about 4 years ago. The family is not interested in a formal work up at this time. They have not tried using hand brace.   HISTORY:  [name] is a 78 year old female with a history of memory disturbance. She returns today for follow-up. She is currently not on any medication for her memory. He feels that her memory has remained the same however daughter feels is gotten worse. She continues to live at home with her husband. She is able to complete all ADLs independently. She does do some cooking however the daughter reports that she has deferred food at times. She denies any trouble sleeping. Denies any significant changes with her mood. Over the holidays her daughter noticed that she did not handle crowds  very well and became agitated. She denies hallucinations although she does report that sometimes when she is going to sleep she feels as if she hears singing. She is unsure if this is a hallucination or if it is real. She returns today for an evaluation.  REVIEW OF SYSTEMS: Out of a complete 14 system review of symptoms, the patient complains only of the following symptoms, memory loss, confusion, numbness in hands, sleep talking, agitation, ankle swelling, frequent waking and all other reviewed systems are negative.  ALLERGIES: Allergies  Allergen Reactions  . Pneumococcal Vaccines Swelling    Per pt when received 2015  . Ampicillin Other (See Comments)    REACTION: causes urticaria  . Neomycin-Bacitracin Zn-Polymyx Rash  . Penicillins Other (See Comments)    REACTION: causes urticaria    HOME MEDICATIONS: Outpatient Medications Prior to Visit  Medication Sig Dispense Refill  . aspirin 81 MG EC tablet Take 1 tablet (81 mg total) by mouth daily. Swallow whole. 30 tablet 12  . donepezil (ARICEPT) 10 MG tablet Take 1 tablet (10 mg total) by mouth at bedtime. 90 tablet 3  . hydrochlorothiazide (HYDRODIURIL) 25 MG tablet Take 1 tablet (25 mg total) by mouth daily. 90 tablet 0  . irbesartan (AVAPRO) 150 MG tablet TAKE 1 TABLET(150 MG) BY MOUTH DAILY 90 tablet 1  . levocetirizine (XYZAL) 5 MG tablet TAKE 1 TABLET(5 MG) BY MOUTH EVERY EVENING 90 tablet 1  .  metFORMIN (GLUCOPHAGE) 500 MG tablet TAKE 1 TABLET(500 MG) BY MOUTH TWICE DAILY WITH A MEAL 180 tablet 0  . propranolol (INDERAL) 20 MG tablet TAKE 1 TABLET(20 MG) BY MOUTH TWICE DAILY 180 tablet 1  . simvastatin (ZOCOR) 20 MG tablet TAKE 1 TABLET BY MOUTH DAILY AT 6 PM 90 tablet 1  . hydrochlorothiazide (HYDRODIURIL) 25 MG tablet TAKE 1 TABLET(25 MG) BY MOUTH DAILY 90 tablet 1   No facility-administered medications prior to visit.     PAST MEDICAL HISTORY: Past Medical History:  Diagnosis Date  . Contact dermatitis and other  eczema, due to unspecified cause   . Displacement of lumbar intervertebral disc without myelopathy   . FH: cataracts   . Gout, unspecified   . Memory loss   . Occlusion and stenosis of carotid artery without mention of cerebral infarction   . Other and unspecified hyperlipidemia   . Pain in joint, shoulder region   . Pain in limb   . Routine general medical examination at a health care facility   . Sciatica   . Type II or unspecified type diabetes mellitus without mention of complication, uncontrolled   . Unspecified essential hypertension   . Unspecified venous (peripheral) insufficiency     PAST SURGICAL HISTORY: Past Surgical History:  Procedure Laterality Date  . ANTERIOR CERVICAL DECOMP/DISCECTOMY FUSION N/A 12/22/2014   Procedure: Anterior Cervical Four-Five/Five-Six/Six-Seven Decompression/Diskectomy/ and Fsion;  Surgeon: Leeroy Cha, MD;  Location: [facility];  Service: Neurosurgery;  Laterality: N/A;  C4-5 C5-6 C6-7 Anterior cervical decompression/diskectomy/fusion  . LAPAROSCOPIC APPENDECTOMY    . LUMBAR LAMINECTOMY  07/2008   [place]  . TUBAL LIGATION      FAMILY HISTORY: Family History  Problem Relation Age of Onset  . Alzheimer's disease Mother   . Diabetes Mother   . Coronary artery disease Father        CABG  . Hypertension Father   . Cancer Brother   . Cancer Brother   . Colon cancer Neg Hx   . Breast cancer Neg Hx     SOCIAL HISTORY: Social History   Socioeconomic History  . Marital status: Married    Spouse name: Laverna Peace  . Number of children: 3  . Years of education: 60  . Highest education level: Not on file  Occupational History  . Not on file  Social Needs  . Financial resource strain: Not on file  . Food insecurity:    Worry: Not on file    Inability: Not on file  . Transportation needs:    Medical: Not on file    Non-medical: Not on file  Tobacco Use  . Smoking status: Former Smoker    Last attempt to quit: 05/23/1995    Years since  quitting: 23.0  . Smokeless tobacco: Never Used  Substance and Sexual Activity  . Alcohol use: No  . Drug use: No  . Sexual activity: Yes    Partners: Male  Lifestyle  . Physical activity:    Days per week: Not on file    Minutes per session: Not on file  . Stress: Not on file  Relationships  . Social connections:    Talks on phone: Not on file    Gets together: Not on file    Attends religious service: Not on file    Active member of club or organization: Not on file    Attends meetings of clubs or organizations: Not on file    Relationship status: Not on file  .  Intimate partner violence:    Fear of current or ex partner: Not on file    Emotionally abused: Not on file    Physically abused: Not on file    Forced sexual activity: Not on file  Other Topics Concern  . Not on file  Social History Narrative   HSG.  Married '59.  2 sons - '65, '62; 1 dtr '61; 7 grandchildren; 3 great-grandchildren.  Lives w/husband in own home.  END OF LIFE: she does not want to be kept alive on machines. Needs more information in regard to CPR, etc. Provided w/[employer] guide and MOST (7/09)      PHYSICAL EXAM  Vitals:   06/06/18 0822  BP: (!) 162/81  Pulse: 79  Weight: 144 lb 3.2 oz (65.4 kg)  Height: 5\' 2"  (1.575 m)   Body mass index is 26.37 kg/m.   MMSE - Mini Mental State Exam 06/06/2018 12/03/2017 06/05/2017  Not completed: (No Data) - -  Orientation to time 3 4 4   Orientation to Place 3 5 4   Registration 3 3 3   Attention/ Calculation 5 5 0  Recall 0 0 0  Language- name 2 objects 2 2 2   Language- repeat 1 1 1   Language- follow 3 step command 3 3 3   Language- read & follow direction 1 1 1   Write a sentence 1 1 1   Copy design 1 1 1   Total score 23 26 20      Generalized: Well developed, in no acute distress  Cardiology: Regular rhythm and rate, no murmur.  Neurological examination  Mentation: Alert oriented to time (season and year), place, history taking. Follows all  commands speech and language fluent Cranial nerve II-XII: Pupils were equal round reactive to light. Extraocular movements were full, visual field were full on confrontational test. Facial sensation and strength were normal. Uvula tongue midline. Shoulder shrug normal and symmetric. Motor: The motor testing reveals 5 over 5 strength of BUE and 4/5 in BLE. Good symmetric motor tone is noted throughout.  Sensory: Sensory testing is intact to soft touch on all 4 extremities, decreased pinprick, temperature and vibration on right hand. No evidence of extinction is noted.  Coordination: Cerebellar testing reveals good finger-nose-finger  Gait and station: Wide based gait. Romberg is negative. No drift is seen.  Reflexes: Deep tendon reflexes are trace in all extremities, symmetric bilaterally.   DIAGNOSTIC DATA (LABS, IMAGING, TESTING) - I reviewed patient records, labs, notes, testing and imaging myself where available.  Lab Results  Component Value Date   WBC 8.3 04/01/2018   HGB 12.3 04/01/2018   HCT 37.3 04/01/2018   MCV 87.7 04/01/2018   PLT 398.0 04/01/2018      Component Value Date/Time   NA 140 04/01/2018 0932   K 4.3 04/01/2018 0932   CL 102 04/01/2018 0932   CO2 28 04/01/2018 0932   GLUCOSE 145 (H) 04/01/2018 0932   BUN 23 04/01/2018 0932   CREATININE 1.01 04/01/2018 0932   CALCIUM 10.0 04/01/2018 0932   PROT 7.3 09/26/2017 1127   ALBUMIN 4.1 09/26/2017 1127   AST 23 09/26/2017 1127   ALT 22 09/26/2017 1127   ALKPHOS 86 09/26/2017 1127   BILITOT 1.0 09/26/2017 1127   GFRNONAA 59 (L) 12/24/2014 0450   GFRAA >60 12/24/2014 0450   Lab Results  Component Value Date   CHOL 130 09/26/2017   HDL 29.70 (L) 09/26/2017   LDLCALC 62 04/21/2015   LDLDIRECT 80.0 09/26/2017   TRIG 209.0 (H) 09/26/2017  CHOLHDL 4 09/26/2017   Lab Results  Component Value Date   HGBA1C 6.6 (H) 04/01/2018   Lab Results  Component Value Date   VITAMINB12 602 10/30/2017   Lab Results    Component Value Date   TSH 2.09 09/26/2017      ASSESSMENT AND PLAN 78 y.o. year old female  has a past medical history of Contact dermatitis and other eczema, due to unspecified cause, Displacement of lumbar intervertebral disc without myelopathy, FH: cataracts, Gout, unspecified, Memory loss, Occlusion and stenosis of carotid artery without mention of cerebral infarction, Other and unspecified hyperlipidemia, Pain in joint, shoulder region, Pain in limb, Routine general medical examination at a health care facility, Sciatica, Type II or unspecified type diabetes mellitus without mention of complication, uncontrolled, Unspecified essential hypertension, and Unspecified venous (peripheral) insufficiency. here with   1. Dementia with behavioral disturbance  2. Numbness in right hand  She is tolerating Aricept well. Continue Aricept as prescribed. We will add Namenda 5mg  daily. Daughter educated on safety risks regarding patient driving and cooking alone. Advised close monitoring. Trial hand brace for presumed carpal tunnel symptoms. Follow up in 6 months or sooner if needed.   Ward Givens, MSN, NP-C 06/06/2018, 8:32 AM [facility] [address], [place], [place] [address] [phone]

## 2018-06-06 NOTE — Progress Notes (Signed)
I have read the note, and I agree with the clinical assessment and plan.  [name]   

## 2018-06-06 NOTE — Patient Instructions (Signed)
Your Plan:  Continue Aricept  Start Namenda 5 mg daily  If your symptoms worsen or you develop new symptoms please let us know.   Thank you for coming to see [place] at [facility]. I hope we have been able to provide you high quality care today.  You may receive a patient satisfaction survey over the next few weeks. We would appreciate your feedback and comments so that we may continue to improve ourselves and the health of our patients.  Memantine Tablets What is this medicine? MEMANTINE (MEM an teen) is used to treat dementia caused by Alzheimer's disease. This medicine may be used for other purposes; ask your health care provider or pharmacist if you have questions. COMMON BRAND NAME(S): Namenda What should I tell my health care provider before I take this medicine? They need to know if you have any of these conditions: -difficulty passing urine -kidney disease -liver disease -seizures -an unusual or allergic reaction to memantine, other medicines, foods, dyes, or preservatives -pregnant or trying to get pregnant -breast-feeding How should I use this medicine? Take this medicine by mouth with a glass of water. Follow the directions on the prescription label. You may take this medicine with or without food. Take your doses at regular intervals. Do not take your medicine more often than directed. Continue to take your medicine even if you feel better. Do not stop taking except on the advice of your doctor or health care professional. Talk to your pediatrician regarding the use of this medicine in children. Special care may be needed. Overdosage: If you think you have taken too much of this medicine contact a poison control center or emergency room at once. NOTE: This medicine is only for you. Do not share this medicine with others. What if I miss a dose? If you miss a dose, take it as soon as you can. If it is almost time for your next dose, take only that dose. Do not  take double or extra doses. If you do not take your medicine for several days, contact your health care provider. Your dose may need to be changed. What may interact with this medicine? -acetazolamide -amantadine -cimetidine -dextromethorphan -dofetilide -hydrochlorothiazide -ketamine -metformin -methazolamide -quinidine -ranitidine -sodium bicarbonate -triamterene This list may not describe all possible interactions. Give your health care provider a list of all the medicines, herbs, non-prescription drugs, or dietary supplements you use. Also tell them if you smoke, drink alcohol, or use illegal drugs. Some items may interact with your medicine. What should I watch for while using this medicine? Visit your doctor or health care professional for regular checks on your progress. Check with your doctor or health care professional if there is no improvement in your symptoms or if they get worse. You may get drowsy or dizzy. Do not drive, use machinery, or do anything that needs mental alertness until you know how this drug affects you. Do not stand or sit up quickly, especially if you are an older patient. This reduces the risk of dizzy or fainting spells. Alcohol can make you more drowsy and dizzy. Avoid alcoholic drinks. What side effects may I notice from receiving this medicine? Side effects that you should report to your doctor or health care professional as soon as possible: -allergic reactions like skin rash, itching or hives, swelling of the face, lips, or tongue -agitation or a feeling of restlessness -depressed mood -dizziness -hallucinations -redness, blistering, peeling or loosening of the skin, including inside the mouth -seizures -  vomiting Side effects that usually do not require medical attention (report to your doctor or health care professional if they continue or are bothersome): -constipation -diarrhea -headache -nausea -trouble sleeping This list may not describe  all possible side effects. Call your doctor for medical advice about side effects. You may report side effects to FDA at 1-800-FDA-1088. Where should I keep my medicine? Keep out of the reach of children. Store at room temperature between 15 degrees and 30 degrees C (59 degrees and 86 degrees F). Throw away any unused medicine after the expiration date. NOTE: This sheet is a summary. It may not cover all possible information. If you have questions about this medicine, talk to your doctor, pharmacist, or health care provider.  2019 Elsevier/Gold Standard (2013-02-24 14:10:42)

## 2018-07-02 ENCOUNTER — Ambulatory Visit: Payer: Medicare Other | Admitting: Adult Health

## 2018-07-04 ENCOUNTER — Other Ambulatory Visit: Payer: Self-pay | Admitting: Internal Medicine

## 2018-07-04 DIAGNOSIS — I10 Essential (primary) hypertension: Secondary | ICD-10-CM

## 2018-08-23 ENCOUNTER — Other Ambulatory Visit: Payer: Self-pay | Admitting: Internal Medicine

## 2018-08-23 DIAGNOSIS — E1140 Type 2 diabetes mellitus with diabetic neuropathy, unspecified: Secondary | ICD-10-CM

## 2018-09-26 ENCOUNTER — Ambulatory Visit (INDEPENDENT_AMBULATORY_CARE_PROVIDER_SITE_OTHER)
Admission: RE | Admit: 2018-09-26 | Discharge: 2018-09-26 | Disposition: A | Discharge status: Home / Self Care | Payer: Medicare Other | Source: Ambulatory Visit | Attending: Internal Medicine | Admitting: Internal Medicine

## 2018-09-26 ENCOUNTER — Ambulatory Visit (INDEPENDENT_AMBULATORY_CARE_PROVIDER_SITE_OTHER): Payer: Medicare Other | Admitting: Internal Medicine

## 2018-09-26 ENCOUNTER — Other Ambulatory Visit: Payer: Self-pay

## 2018-09-26 ENCOUNTER — Encounter: Payer: Self-pay | Admitting: Internal Medicine

## 2018-09-26 ENCOUNTER — Telehealth: Payer: Self-pay | Admitting: Internal Medicine

## 2018-09-26 ENCOUNTER — Other Ambulatory Visit (INDEPENDENT_AMBULATORY_CARE_PROVIDER_SITE_OTHER): Payer: Medicare Other

## 2018-09-26 VITALS — BP 162/88 | HR 65 | Temp 98.0°F | Resp 16 | Ht 62.0 in | Wt 140.2 lb

## 2018-09-26 DIAGNOSIS — I10 Essential (primary) hypertension: Secondary | ICD-10-CM

## 2018-09-26 DIAGNOSIS — M10071 Idiopathic gout, right ankle and foot: Secondary | ICD-10-CM

## 2018-09-26 DIAGNOSIS — M79671 Pain in right foot: Secondary | ICD-10-CM | POA: Diagnosis not present

## 2018-09-26 DIAGNOSIS — N183 Chronic kidney disease, stage 3 unspecified: Secondary | ICD-10-CM | POA: Insufficient documentation

## 2018-09-26 DIAGNOSIS — M79674 Pain in right toe(s): Secondary | ICD-10-CM

## 2018-09-26 DIAGNOSIS — L03031 Cellulitis of right toe: Secondary | ICD-10-CM | POA: Diagnosis not present

## 2018-09-26 DIAGNOSIS — E1140 Type 2 diabetes mellitus with diabetic neuropathy, unspecified: Secondary | ICD-10-CM

## 2018-09-26 LAB — BASIC METABOLIC PANEL
BUN: 18 mg/dL (ref 6–23)
CO2: 28 mEq/L (ref 19–32)
Calcium: 9.3 mg/dL (ref 8.4–10.5)
Chloride: 102 mEq/L (ref 96–112)
Creatinine, Ser: 0.95 mg/dL (ref 0.40–1.20)
GFR: 56.76 mL/min — ABNORMAL LOW (ref >60.00)
Glucose, Bld: 129 mg/dL — ABNORMAL HIGH (ref 70–99)
Potassium: 3.8 mEq/L (ref 3.5–5.1)
Sodium: 139 mEq/L (ref 135–145)

## 2018-09-26 LAB — CBC WITH DIFFERENTIAL/PLATELET
Basophils Absolute: 0.1 10*3/uL (ref 0.0–0.1)
Basophils Relative: 1.1 % (ref 0.0–3.0)
Eosinophils Absolute: 0.5 10*3/uL (ref 0.0–0.7)
Eosinophils Relative: 6.1 % — ABNORMAL HIGH (ref 0.0–5.0)
HCT: 36.7 % (ref 36.0–46.0)
Hemoglobin: 12.2 g/dL (ref 12.0–15.0)
Lymphocytes Relative: 25.9 % (ref 12.0–46.0)
Lymphs Abs: 2.2 10*3/uL (ref 0.7–4.0)
MCHC: 33.3 g/dL (ref 30.0–36.0)
MCV: 88 fl (ref 78.0–100.0)
Monocytes Absolute: 0.7 10*3/uL (ref 0.1–1.0)
Monocytes Relative: 8.7 % (ref 3.0–12.0)
Neutro Abs: 4.9 10*3/uL (ref 1.4–7.7)
Neutrophils Relative %: 58.2 % (ref 43.0–77.0)
Platelets: 383 10*3/uL (ref 150.0–400.0)
RBC: 4.17 Mil/uL (ref 3.87–5.11)
RDW: 14.3 % (ref 11.5–15.5)
WBC: 8.3 10*3/uL (ref 4.0–10.5)

## 2018-09-26 LAB — SEDIMENTATION RATE: Sed Rate: 59 mm/hr — ABNORMAL HIGH (ref 0–30)

## 2018-09-26 LAB — MICROALBUMIN / CREATININE URINE RATIO
Creatinine,U: 36.7 mg/dL
Microalb Creat Ratio: 4.5 mg/g (ref 0.0–30.0)
Microalb, Ur: 1.6 mg/dL (ref 0.0–1.9)

## 2018-09-26 LAB — HEMOGLOBIN A1C: Hgb A1c MFr Bld: 6.9 % — ABNORMAL HIGH (ref 4.6–6.5)

## 2018-09-26 LAB — URIC ACID: Uric Acid, Serum: 6.2 mg/dL (ref 2.4–7.0)

## 2018-09-26 MED ORDER — METHYLPREDNISOLONE 4 MG PO TBPK: ORAL_TABLET | ORAL | 0 refills | Status: AC

## 2018-09-26 MED ORDER — COLCHICINE 0.6 MG PO CAPS: 1.0000 | ORAL_CAPSULE | Freq: Two times a day (BID) | ORAL | 1 refills | Status: DC

## 2018-09-26 MED ORDER — CEFDINIR 300 MG PO CAPS: 300.0000 mg | ORAL_CAPSULE | Freq: Two times a day (BID) | ORAL | 0 refills | Status: AC

## 2018-09-26 NOTE — Telephone Encounter (Signed)
Copied from [place] [id number]. Topic: Quick Communication - See Telephone Encounter >> Sep 26, 2018  2:19 PM Blase Mess A wrote: CRM for notification. See Telephone encounter for: 09/26/18.Patient is calling to inquire if she is not supposed to drive anymore. Is that anywhere on her medical record. Please advise CB- [phone]

## 2018-09-26 NOTE — Progress Notes (Signed)
Subjective:  Patient ID: [name], female    DOB: 09-02-1940  Age: 78 y.o. MRN: [id number]  CC: Toe Pain   HPI [name] presents for concerns about her right 3rd toe.  It is very get difficult to get a history from her due to her dementia.  From what she can tell me she has a 2-day history of pain, redness, and swelling in her right third toe.  She does not recall a trauma or injury.  She said she has had some chills but denies fever.  She has a remote history of gout.  She has not taken anything to control the symptoms.  Outpatient Medications Prior to Visit  Medication Sig Dispense Refill  . aspirin 81 MG EC tablet Take 1 tablet (81 mg total) by mouth daily. Swallow whole. 30 tablet 12  . donepezil (ARICEPT) 10 MG tablet Take 1 tablet (10 mg total) by mouth at bedtime. 90 tablet 3  . hydrochlorothiazide (HYDRODIURIL) 25 MG tablet TAKE 1 TABLET(25 MG) BY MOUTH DAILY 90 tablet 0  . irbesartan (AVAPRO) 150 MG tablet TAKE 1 TABLET(150 MG) BY MOUTH DAILY 90 tablet 1  . levocetirizine (XYZAL) 5 MG tablet TAKE 1 TABLET(5 MG) BY MOUTH EVERY EVENING 90 tablet 1  . memantine (NAMENDA) 5 MG tablet Take 1 tablet (5 mg total) by mouth daily. 30 tablet 5  . metFORMIN (GLUCOPHAGE) 500 MG tablet TAKE 1 TABLET(500 MG) BY MOUTH TWICE DAILY WITH A MEAL 180 tablet 0  . propranolol (INDERAL) 20 MG tablet TAKE 1 TABLET(20 MG) BY MOUTH TWICE DAILY 180 tablet 1  . simvastatin (ZOCOR) 20 MG tablet TAKE 1 TABLET BY MOUTH DAILY AT 6 PM 90 tablet 1   No facility-administered medications prior to visit.     ROS Review of Systems  Constitutional: Positive for chills. Negative for fatigue and fever.  HENT: Negative.   Respiratory: Negative for cough, shortness of breath and wheezing.   Cardiovascular: Negative for chest pain, palpitations and leg swelling.  Gastrointestinal: Negative for abdominal pain, constipation, diarrhea, nausea and vomiting.  Genitourinary: Negative.  Negative for difficulty  urinating.  Musculoskeletal: Positive for arthralgias. Negative for myalgias.  Skin: Positive for color change. Negative for rash.  Neurological: Negative.   Hematological: Negative for adenopathy. Does not bruise/bleed easily.  Psychiatric/Behavioral: Negative.     Objective:  BP (!) 162/88 (BP Location: Left Arm, Patient Position: Sitting, Cuff Size: Normal)   Pulse 65   Temp 98 F (36.7 C) (Oral)   Resp 16   Ht 5\' 2"  (1.575 m)   Wt 140 lb 4 oz (63.6 kg)   SpO2 99%   BMI 25.65 kg/m   BP Readings from Last 3 Encounters:  09/26/18 (!) 162/88  06/06/18 (!) 162/81  04/01/18 (!) 164/78    Wt Readings from Last 3 Encounters:  09/26/18 140 lb 4 oz (63.6 kg)  06/06/18 144 lb 3.2 oz (65.4 kg)  04/01/18 143 lb (64.9 kg)    Physical Exam Vitals signs reviewed.  Constitutional:      General: She is not in acute distress.    Appearance: She is not ill-appearing, toxic-appearing or diaphoretic.  HENT:     Nose: Nose normal.     Mouth/Throat:     Mouth: Mucous membranes are moist.     Pharynx: No oropharyngeal exudate.  Eyes:     General: No scleral icterus.    Conjunctiva/sclera: Conjunctivae normal.  Neck:     Musculoskeletal: Normal range of  motion and neck supple. No muscular tenderness.  Cardiovascular:     Rate and Rhythm: Normal rate and regular rhythm.     Heart sounds: No murmur.  Pulmonary:     Effort: Pulmonary effort is normal.     Breath sounds: Normal breath sounds. No wheezing or rales.  Abdominal:     General: Abdomen is protuberant. Bowel sounds are normal.     Palpations: There is no hepatomegaly, splenomegaly or mass.     Tenderness: There is no abdominal tenderness.  Musculoskeletal:        General: No swelling.     Right lower leg: Edema (1+ pitting) present.     Left lower leg: Edema (1+ pitting) present.       Feet:  Lymphadenopathy:     Cervical: No cervical adenopathy.  Skin:    General: Skin is warm and dry.     Coloration: Skin is not  pale.  Neurological:     General: No focal deficit present.  Psychiatric:        Attention and Perception: She is inattentive.        Mood and Affect: Mood and affect normal.        Speech: Speech is delayed and tangential.        Behavior: Behavior normal. Behavior is cooperative.        Thought Content: Thought content normal.        Cognition and Memory: Cognition is impaired. Memory is impaired. She exhibits impaired recent memory and impaired remote memory.        Judgment: Judgment normal.     Lab Results  Component Value Date   WBC 8.3 09/26/2018   HGB 12.2 09/26/2018   HCT 36.7 09/26/2018   PLT 383.0 09/26/2018   GLUCOSE 129 (H) 09/26/2018   CHOL 130 09/26/2017   TRIG 209.0 (H) 09/26/2017   HDL 29.70 (L) 09/26/2017   LDLDIRECT 80.0 09/26/2017   LDLCALC 62 04/21/2015   ALT 22 09/26/2017   AST 23 09/26/2017   NA 139 09/26/2018   K 3.8 09/26/2018   CL 102 09/26/2018   CREATININE 0.95 09/26/2018   BUN 18 09/26/2018   CO2 28 09/26/2018   TSH 2.09 09/26/2017   HGBA1C 6.9 (H) 09/26/2018   MICROALBUR 1.6 09/26/2018    Mr Brain Wo Contrast  Result Date: 12/18/2016  [facility] [address], [place], [place] [address] [phone] NEUROIMAGING REPORT STUDY DATE: 12/15/2016 PATIENT NAME: [name] DOB: May 03, 1941 MRN: [id number] EXAM: MRI Brain without contrast ORDERING CLINICIAN: Kathrynn Ducking M.D. CLINICAL HISTORY: 78 year old woman with memory loss COMPARISON FILMS: 12/14/2011 TECHNIQUE: MRI of the brain without contrast was obtained utilizing 5 mm axial slices with T1, T2, T2 flair, SWI and diffusion weighted views.  T1 sagittal and T2 coronal views were obtained. CONTRAST: none IMAGING SITE: [facility], [place], [place] FINDINGS: On sagittal images, the spinal cord is imaged caudally to C4 and is normal in caliber. Metal artifact, probably from ACDF,  is noted at C4.  The contents of the posterior fossa are of normal size  and position.   The pituitary gland and optic chiasm appear normal.    There is moderate cortical atrophy most pronounced in the mesial temporal lobes. The atrophy has significantly progressed rest when compared to the 12/14/2011 MRI.  The ventricles are proportionately enlarged.  There are no abnormal extra-axial collections of fluid.  The cerebellum and brainstem appears normal.   The deep  gray matter appears normal.  The hemispheres, there are T2/FLAIR hyperintense foci predominantly in the periventricular and deep white matter distant with chronic microvessel ischemic change. When compared to the MRI of the brain dated 12/14/2011, this has progressed..  Diffusion weighted images are normal.  Susceptibility weighted images are normal.  The orbits appear normal.   The VIIth/VIIIth nerve complex appears normal.  The mastoid air cells appear normal.  The paranasal sinuses appear normal.  Flow voids are identified within the major intracerebral arteries.      This MRI of the brain without contrast shows the following: 1.    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 This has significantly progressed when compared to the MRI dated 12/14/2011. 2.    Chronic microvascular ischemic change, I'll progress we'll compared to the 12/14/2011 MRI. 3.    There are no acute findings. INTERPRETING PHYSICIAN: Richard A. Felecia Shelling, MD, PhD, FAAN Certified in  Neuroimaging by [employer]   Dg Foot Complete Right  Result Date: 09/26/2018 CLINICAL DATA:  Right third digit pain since yesterday, atraumatic. EXAM: RIGHT FOOT COMPLETE - 3+ VIEW COMPARISON:  None. FINDINGS: No acute fracture, dislocation, or joint erosion. There is periarticular calcification about the first metatarsal head. Patient has history of gout. Dorsal forefoot soft tissue swelling. IMPRESSION: 1. No acute osseous findings. 2. Calcification about the first MTP joint, likely gouty. Electronically Signed   By:  Monte Fantasia M.D.   On: 09/26/2018 10:44     Assessment & Plan:   Dorene was seen today for toe pain.  Diagnoses and all orders for this visit:  Toe pain, right- I am concerned this either a strep cellulitis or a gout flare.  I have therefore decided to treat her with a broad-spectrum cephalosporin antibiotic as well as a course of methylprednisolone and colchicine. -     DG Foot Complete Right; Future -     CBC with Differential/Platelet; Future -     Sedimentation rate; Future  Acute idiopathic gout involving toe of right foot -     Uric acid; Future -     methylPREDNISolone (MEDROL DOSEPAK) 4 MG TBPK tablet; TAKE AS DIRECTED -     Colchicine 0.6 MG CAPS; Take 1 capsule by mouth 2 (two) times a day.  Essential hypertension- Considering her age her blood pressure is adequately well controlled. -     Basic metabolic panel; Future  Controlled type 2 diabetes mellitus with diabetic neuropathy, without long-term current use of insulin ([place])- Her A1c is at 6.9%.  Her blood sugars are adequately well controlled. -     Hemoglobin A1c; Future -     Basic metabolic panel; Future -     Microalbumin / creatinine urine ratio; Future -     HM Diabetes Foot Exam  Cellulitis of right toe -     cefdinir (OMNICEF) 300 MG capsule; Take 1 capsule (300 mg total) by mouth 2 (two) times daily for 7 days.  Chronic renal disease, stage 3, moderately decreased glomerular filtration rate (GFR) between 30-59 mL/min/1.73 square meter ([place])- She will avoid nephrotoxic agents.  I will continue to work on controlling her blood pressure and blood sugars.   I am having Lillyonna J. Episcopo start on methylPREDNISolone, Colchicine, and cefdinir. I am also having her maintain her aspirin, donepezil, irbesartan, simvastatin, propranolol, levocetirizine, memantine, hydrochlorothiazide, and metFORMIN.  Meds ordered this encounter  Medications  . methylPREDNISolone (MEDROL DOSEPAK) 4 MG TBPK tablet  Sig: TAKE AS  DIRECTED    Dispense:  21 tablet    Refill:  0  . Colchicine 0.6 MG CAPS    Sig: Take 1 capsule by mouth 2 (two) times a day.    Dispense:  60 capsule    Refill:  1  . cefdinir (OMNICEF) 300 MG capsule    Sig: Take 1 capsule (300 mg total) by mouth 2 (two) times daily for 7 days.    Dispense:  14 capsule    Refill:  0     Follow-up: Return in about 1 week (around 10/03/2018).  Scarlette Calico, MD

## 2018-09-26 NOTE — Patient Instructions (Signed)
Gout    Gout is a condition that causes painful swelling of the joints. Gout is a type of inflammation of the joints (arthritis). This condition is caused by having too much uric acid in the body. Uric acid is a chemical that forms when the body breaks down substances called purines. Purines are important for building body proteins.  When the body has too much uric acid, sharp crystals can form and build up inside the joints. This causes pain and swelling. Gout attacks can happen quickly and may be very painful (acute gout). Over time, the attacks can affect more joints and become more frequent (chronic gout). Gout can also cause uric acid to build up under the skin and inside the kidneys.  What are the causes?  This condition is caused by too much uric acid in your blood. This can happen because:   Your kidneys do not remove enough uric acid from your blood. This is the most common cause.   Your body makes too much uric acid. This can happen with some cancers and cancer treatments. It can also occur if your body is breaking down too many red blood cells (hemolytic anemia).   You eat too many foods that are high in purines. These foods include organ meats and some seafood. Alcohol, especially beer, is also high in purines.  A gout attack may be triggered by trauma or stress.  What increases the risk?  You are more likely to develop this condition if you:   Have a family history of gout.   Are female and middle-aged.   Are female and have gone through menopause.   Are obese.   Frequently drink alcohol, especially beer.   Are dehydrated.   Lose weight too quickly.   Have an organ transplant.   Have lead poisoning.   Take certain medicines, including aspirin, cyclosporine, diuretics, levodopa, and niacin.   Have kidney disease.   Have a skin condition called psoriasis.  What are the signs or symptoms?  An attack of acute gout happens quickly. It usually occurs in just one joint. The most common place is  the big toe. Attacks often start at night. Other joints that may be affected include joints of the feet, ankle, knee, fingers, wrist, or elbow. Symptoms of this condition may include:   Severe pain.   Warmth.   Swelling.   Stiffness.   Tenderness. The affected joint may be very painful to touch.   Shiny, red, or purple skin.   Chills and fever.  Chronic gout may cause symptoms more frequently. More joints may be involved. You may also have white or yellow lumps (tophi) on your hands or feet or in other areas near your joints.  How is this diagnosed?  This condition is diagnosed based on your symptoms, medical history, and physical exam. You may have tests, such as:   Blood tests to measure uric acid levels.   Removal of joint fluid with a thin needle (aspiration) to look for uric acid crystals.   X-rays to look for joint damage.  How is this treated?  Treatment for this condition has two phases: treating an acute attack and preventing future attacks. Acute gout treatment may include medicines to reduce pain and swelling, including:   NSAIDs.   Steroids. These are strong anti-inflammatory medicines that can be taken by mouth (orally) or injected into a joint.   Colchicine. This medicine relieves pain and swelling when it is taken soon after an attack. It   can be given by mouth or through an IV.  Preventive treatment may include:   Daily use of smaller doses of NSAIDs or colchicine.   Use of a medicine that reduces uric acid levels in your blood.   Changes to your diet. You may need to see a dietitian about what to eat and drink to prevent gout.  Follow these instructions at home:  During a gout attack     If directed, put ice on the affected area:  ? Put ice in a plastic bag.  ? Place a towel between your skin and the bag.  ? Leave the ice on for 20 minutes, 2-3 times a day.   Raise (elevate) the affected joint above the level of your heart as often as possible.   Rest the joint as much as possible.  If the affected joint is in your leg, you may be given crutches to use.   Follow instructions from your health care provider about eating or drinking restrictions.  Avoiding future gout attacks   Follow a low-purine diet as told by your dietitian or health care provider. Avoid foods and drinks that are high in purines, including liver, kidney, anchovies, asparagus, herring, mushrooms, mussels, and beer.   Maintain a healthy weight or lose weight if you are overweight. If you want to lose weight, talk with your health care provider. It is important that you do not lose weight too quickly.   Start or maintain an exercise program as told by your health care provider.  Eating and drinking   Drink enough fluids to keep your urine pale yellow.   If you drink alcohol:  ? Limit how much you use to:   0-1 drink a day for women.   0-2 drinks a day for men.  ? Be aware of how much alcohol is in your drink. In the [place] one drink equals one 12 oz bottle of beer (355 mL) one 5 oz glass of wine (148 mL), or one 1 oz glass of hard liquor (44 mL).  General instructions   Take over-the-counter and prescription medicines only as told by your health care provider.   Do not drive or use heavy machinery while taking prescription pain medicine.   Return to your normal activities as told by your health care provider. Ask your health care provider what activities are safe for you.   Keep all follow-up visits as told by your health care provider. This is important.  Contact a health care provider if you have:   Another gout attack.   Continuing symptoms of a gout attack after 10 days of treatment.   Side effects from your medicines.   Chills or a fever.   Burning pain when you urinate.   Pain in your lower back or belly.  Get help right away if you:   Have severe or uncontrolled pain.   Cannot urinate.  Summary   Gout is painful swelling of the joints caused by inflammation.   The most common site of pain is the big  toe, but it can affect other joints in the body.   Medicines and dietary changes can help to prevent and treat gout attacks.  This information is not intended to replace advice given to you by your health care provider. Make sure you discuss any questions you have with your health care provider.  Document Released: 05/05/2000 Document Revised: 11/28/2017 Document Reviewed: 11/28/2017  Elsevier Interactive Patient Education  2019 [employer] Inc[employer]

## 2018-09-26 NOTE — Telephone Encounter (Signed)
Called pt dtr and informed that there is nothing in the chart that states pt should not drive. Informed however that she should not attend appointments alone anymore.   Pt dtr requested to be informed of appointments.

## 2018-09-30 ENCOUNTER — Telehealth: Payer: Self-pay | Admitting: Adult Health

## 2018-09-30 NOTE — Telephone Encounter (Signed)
Pt's daughter on DPR states that the pt has been getting worse in the last two weeks to the point that she does not recognize her husband. Pt's daughter would like to know if the pt can come in sooner than her scheduled appt and does not want to do a Virtual Visit. Please advise.

## 2018-09-30 NOTE — Telephone Encounter (Signed)
Spoke to daughter.  She stated that pt has been more confused the last 3 wks, (not know who her husband its, getting agitated at others for sitiuations that she knew previously (having 2 different shoes then forgeting at pcp that she had come in with different shoes, and groceries (thinking she had forgotten her groceries).  Had been to pcp for foot issue (placed on medrol dose pak and cefdinir).  This was happening prior to this.  She has appt tomorrow at pcp, will discuss then call us back.  I offered appt with MM/NP or SS/NP.  She will let us know. Memantine and donezepil doses confirmed.

## 2018-10-01 ENCOUNTER — Ambulatory Visit (INDEPENDENT_AMBULATORY_CARE_PROVIDER_SITE_OTHER): Payer: Medicare Other | Admitting: Internal Medicine

## 2018-10-01 ENCOUNTER — Encounter: Payer: Self-pay | Admitting: Internal Medicine

## 2018-10-01 ENCOUNTER — Other Ambulatory Visit: Payer: Self-pay

## 2018-10-01 VITALS — BP 162/76 | HR 61 | Temp 98.1°F | Resp 16 | Ht 62.0 in | Wt 147.0 lb

## 2018-10-01 DIAGNOSIS — M1000 Idiopathic gout, unspecified site: Secondary | ICD-10-CM | POA: Diagnosis not present

## 2018-10-01 DIAGNOSIS — E1140 Type 2 diabetes mellitus with diabetic neuropathy, unspecified: Secondary | ICD-10-CM

## 2018-10-01 DIAGNOSIS — L03031 Cellulitis of right toe: Secondary | ICD-10-CM

## 2018-10-01 DIAGNOSIS — I10 Essential (primary) hypertension: Secondary | ICD-10-CM

## 2018-10-01 NOTE — Patient Instructions (Signed)

## 2018-10-01 NOTE — Progress Notes (Signed)
Subjective:  Patient ID: [name], female    DOB: 09-15-1940  Age: 78 y.o. MRN: [id number]  CC: Hypertension   HPI [name] presents for f/up - She tells Cynthia that the pain, redness, and swelling on the top of her third toe is getting much better.  She has compliant with colchicine, methylprednisolone, and the cephalosporin antibiotic.  She has had no more chills and she denies fever, chest pain, shortness of breath, or edema.  Outpatient Medications Prior to Visit  Medication Sig Dispense Refill  . aspirin 81 MG EC tablet Take 1 tablet (81 mg total) by mouth daily. Swallow whole. 30 tablet 12  . cefdinir (OMNICEF) 300 MG capsule Take 1 capsule (300 mg total) by mouth 2 (two) times daily for 7 days. 14 capsule 0  . Colchicine 0.6 MG CAPS Take 1 capsule by mouth 2 (two) times a day. 60 capsule 1  . donepezil (ARICEPT) 10 MG tablet Take 1 tablet (10 mg total) by mouth at bedtime. 90 tablet 3  . hydrochlorothiazide (HYDRODIURIL) 25 MG tablet TAKE 1 TABLET(25 MG) BY MOUTH DAILY 90 tablet 0  . levocetirizine (XYZAL) 5 MG tablet TAKE 1 TABLET(5 MG) BY MOUTH EVERY EVENING 90 tablet 1  . memantine (NAMENDA) 5 MG tablet Take 1 tablet (5 mg total) by mouth daily. 30 tablet 5  . metFORMIN (GLUCOPHAGE) 500 MG tablet TAKE 1 TABLET(500 MG) BY MOUTH TWICE DAILY WITH A MEAL 180 tablet 0  . methylPREDNISolone (MEDROL DOSEPAK) 4 MG TBPK tablet TAKE AS DIRECTED 21 tablet 0  . propranolol (INDERAL) 20 MG tablet TAKE 1 TABLET(20 MG) BY MOUTH TWICE DAILY 180 tablet 1  . simvastatin (ZOCOR) 20 MG tablet TAKE 1 TABLET BY MOUTH DAILY AT 6 PM 90 tablet 1  . irbesartan (AVAPRO) 150 MG tablet TAKE 1 TABLET(150 MG) BY MOUTH DAILY 90 tablet 1   No facility-administered medications prior to visit.     ROS Review of Systems  Constitutional: Negative for chills, diaphoresis and unexpected weight change.  HENT: Negative.   Eyes: Negative for visual disturbance.  Respiratory: Negative for cough, chest  tightness, shortness of breath and wheezing.   Cardiovascular: Negative for chest pain and leg swelling.  Gastrointestinal: Negative for abdominal pain.  Endocrine: Negative.   Genitourinary: Negative.   Musculoskeletal: Negative.  Negative for arthralgias, joint swelling and myalgias.  Skin: Positive for color change. Negative for rash.  Allergic/Immunologic: Negative.   Neurological: Negative.   Hematological: Negative for adenopathy. Does not bruise/bleed easily.  Psychiatric/Behavioral: Negative.     Objective:  BP (!) 162/76 (BP Location: Left Arm, Patient Position: Sitting, Cuff Size: Normal)   Pulse 61   Temp 98.1 F (36.7 C) (Oral)   Resp 16   Ht 5\' 2"  (1.575 m)   Wt 147 lb (66.7 kg)   SpO2 95%   BMI 26.89 kg/m   BP Readings from Last 3 Encounters:  10/01/18 (!) 162/76  09/26/18 (!) 162/88  06/06/18 (!) 162/81    Wt Readings from Last 3 Encounters:  10/01/18 147 lb (66.7 kg)  09/26/18 140 lb 4 oz (63.6 kg)  06/06/18 144 lb 3.2 oz (65.4 kg)    Physical Exam Vitals signs reviewed.  Constitutional:      Appearance: She is not ill-appearing or diaphoretic.  HENT:     Nose: Nose normal. No congestion or rhinorrhea.     Mouth/Throat:     Pharynx: Oropharynx is clear. No oropharyngeal exudate or posterior oropharyngeal erythema.  Eyes:     Conjunctiva/sclera: Conjunctivae normal.  Neck:     Musculoskeletal: Normal range of motion and neck supple.  Cardiovascular:     Rate and Rhythm: Normal rate and regular rhythm.     Heart sounds: No murmur.  Pulmonary:     Effort: Pulmonary effort is normal. No respiratory distress.     Breath sounds: No stridor. No wheezing, rhonchi or rales.  Abdominal:     General: Bowel sounds are normal.     Palpations: There is no mass.     Tenderness: There is no abdominal tenderness. There is no guarding.  Musculoskeletal: Normal range of motion.        General: No swelling.     Right lower leg: No edema.     Left lower leg:  No edema.       Feet:  Skin:    General: Skin is warm.     Findings: Erythema present.  Neurological:     General: No focal deficit present.  Psychiatric:        Mood and Affect: Mood normal.     Lab Results  Component Value Date   WBC 8.3 09/26/2018   HGB 12.2 09/26/2018   HCT 36.7 09/26/2018   PLT 383.0 09/26/2018   GLUCOSE 129 (H) 09/26/2018   CHOL 130 09/26/2017   TRIG 209.0 (H) 09/26/2017   HDL 29.70 (L) 09/26/2017   LDLDIRECT 80.0 09/26/2017   LDLCALC 62 04/21/2015   ALT 22 09/26/2017   AST 23 09/26/2017   NA 139 09/26/2018   K 3.8 09/26/2018   CL 102 09/26/2018   CREATININE 0.95 09/26/2018   BUN 18 09/26/2018   CO2 28 09/26/2018   TSH 2.09 09/26/2017   HGBA1C 6.9 (H) 09/26/2018   MICROALBUR 1.6 09/26/2018    Dg Foot Complete Right  Result Date: 09/26/2018 CLINICAL DATA:  Right third digit pain since yesterday, atraumatic. EXAM: RIGHT FOOT COMPLETE - 3+ VIEW COMPARISON:  None. FINDINGS: No acute fracture, dislocation, or joint erosion. There is periarticular calcification about the first metatarsal head. Patient has history of gout. Dorsal forefoot soft tissue swelling. IMPRESSION: 1. No acute osseous findings. 2. Calcification about the first MTP joint, likely gouty. Electronically Signed   By: Monte Fantasia M.D.   On: 09/26/2018 10:44    Assessment & Plan:   [name] was seen today for hypertension.  Diagnoses and all orders for this visit:  Essential hypertension- Her blood pressure is adequately well controlled.  Controlled type 2 diabetes mellitus with diabetic neuropathy, without long-term current use of insulin ([name])- Her blood sugars are adequately well controlled.  Cellulitis of right toe-  Improvement noted with the cephalosporin antibiotic.  She is asked to complete the 10-day regimen.  Idiopathic gout, unspecified chronicity, unspecified site- Improvement noted with the methylprednisolone and colchicine.  She is asked to continue the colchicine  as needed.   I am having [name] maintain her aspirin, donepezil, simvastatin, propranolol, levocetirizine, memantine, hydrochlorothiazide, metFORMIN, and Colchicine.  No orders of the defined types were placed in this encounter.    Follow-up: Return in about 4 months (around 02/01/2019).  Scarlette Calico, MD

## 2018-10-06 ENCOUNTER — Other Ambulatory Visit: Payer: Self-pay | Admitting: Internal Medicine

## 2018-10-06 DIAGNOSIS — I10 Essential (primary) hypertension: Secondary | ICD-10-CM

## 2018-10-06 DIAGNOSIS — E1140 Type 2 diabetes mellitus with diabetic neuropathy, unspecified: Secondary | ICD-10-CM

## 2018-11-02 ENCOUNTER — Other Ambulatory Visit: Payer: Self-pay | Admitting: Internal Medicine

## 2018-11-18 ENCOUNTER — Other Ambulatory Visit: Payer: Self-pay | Admitting: Internal Medicine

## 2018-11-18 DIAGNOSIS — I10 Essential (primary) hypertension: Secondary | ICD-10-CM

## 2018-11-18 DIAGNOSIS — H6981 Other specified disorders of Eustachian tube, right ear: Secondary | ICD-10-CM

## 2018-11-18 DIAGNOSIS — E1140 Type 2 diabetes mellitus with diabetic neuropathy, unspecified: Secondary | ICD-10-CM

## 2018-11-18 MED ORDER — PROPRANOLOL HCL 20 MG PO TABS: ORAL_TABLET | ORAL | 1 refills | Status: DC

## 2018-11-18 MED ORDER — LEVOCETIRIZINE DIHYDROCHLORIDE 5 MG PO TABS: ORAL_TABLET | ORAL | 1 refills | Status: DC

## 2018-11-18 MED ORDER — METFORMIN HCL 500 MG PO TABS: ORAL_TABLET | ORAL | 1 refills | Status: DC

## 2018-11-21 ENCOUNTER — Ambulatory Visit: Payer: Medicare Other | Admitting: Neurology

## 2018-11-24 NOTE — Progress Notes (Signed)
PATIENT: [name] DOB: 26-Jan-1941  REASON FOR VISIT: follow up HISTORY FROM: patient  HISTORY OF PRESENT ILLNESS: Today 11/25/18  [name] is a 78 year old female with history of memory disorder. Her MMSE today was 26.  She lives with her husband, her son and daughter live nearby.  She is able to perform her ADLs.  Her daughter reports she is having trouble remembering to remove her contact lenses.  Her son manages her medications.  They report they have a hard time getting her to take her medications.  She does still drive.  There have not been any instances of her getting lost.  At times she has difficulty recognizing her husband.  She goes to bed early around 6:30 pm.  At times she will have vivid dreams.  On at least 2 occasions she has called her children around 8 pm, wondering where her husband is.  Both times, he has been home.  She reports her appetite is good, she does some cooking.  They mostly get takeout.  Since her last visit, she had a gout attack in her feet.  She presents today for follow-up accompanied by her daughter.  She remains on Aricept and Namenda.  HISTORY 06/06/18 MM: She presents today for follow up. At her last visit her MMSE was 26. Today it is 23. She has tolerated increased dose of Aricept (10mg ). Her daughter is with her today and aides in history. She is living at home with her husband. She is doing all ADL's.  Her son goes every week to dose medications. There are days when she leaves her medications in her container. There are some concern of whether she is taking her medications. She is still driving. Her daughter reports that they are installing a GPS system in her car. She has not had any trouble with accidents or getting lost at this point.  Her husband is doing most of the finances.  Her daughter reports that she does forget to cut the stove off and her husband usually watches behind her. She is much more irritable that she was previously. Her daughter  states that there are nights where she is yelling out to her husband. She is not sleeping well.   She does also complain about right sided numbness in her thumb and 2nd, 3rd digits.  She has had carpal tunnel surgery in the past.. She also had neck surgery about 4 years ago. The family is not interested in a formal work up at this time. They have not tried using hand brace.   REVIEW OF SYSTEMS: Out of a complete 14 system review of symptoms, the patient complains only of the following symptoms, and all other reviewed systems are negative.  Memory loss  ALLERGIES: Allergies  Allergen Reactions  . Pneumococcal Vaccines Swelling    Per pt when received 2015  . Ampicillin Other (See Comments)    REACTION: causes urticaria  . Neomycin-Bacitracin Zn-Polymyx Rash  . Penicillins Other (See Comments)    REACTION: causes urticaria    HOME MEDICATIONS: Outpatient Medications Prior to Visit  Medication Sig Dispense Refill  . aspirin 81 MG EC tablet Take 1 tablet (81 mg total) by mouth daily. Swallow whole. 30 tablet 12  . Colchicine 0.6 MG CAPS Take 1 capsule by mouth 2 (two) times a day. 60 capsule 1  . donepezil (ARICEPT) 10 MG tablet Take 1 tablet (10 mg total) by mouth at bedtime. 90 tablet 3  . hydrochlorothiazide (HYDRODIURIL) 25  MG tablet TAKE 1 TABLET(25 MG) BY MOUTH DAILY 90 tablet 0  . irbesartan (AVAPRO) 150 MG tablet TAKE 1 TABLET(150 MG) BY MOUTH DAILY 90 tablet 1  . levocetirizine (XYZAL) 5 MG tablet TAKE 1 TABLET(5 MG) BY MOUTH EVERY EVENING 90 tablet 1  . memantine (NAMENDA) 5 MG tablet Take 1 tablet (5 mg total) by mouth daily. 30 tablet 5  . metFORMIN (GLUCOPHAGE) 500 MG tablet TAKE 1 TABLET(500 MG) BY MOUTH TWICE DAILY WITH A MEAL 180 tablet 1  . propranolol (INDERAL) 20 MG tablet TAKE 1 TABLET(20 MG) BY MOUTH TWICE DAILY 180 tablet 1  . simvastatin (ZOCOR) 20 MG tablet Take 1 tablet (20 mg total) by mouth daily at 6 PM. 90 tablet 1   No facility-administered medications  prior to visit.     PAST MEDICAL HISTORY: Past Medical History:  Diagnosis Date  . Contact dermatitis and other eczema, due to unspecified cause   . Displacement of lumbar intervertebral disc without myelopathy   . FH: cataracts   . Gout, unspecified   . Memory loss   . Occlusion and stenosis of carotid artery without mention of cerebral infarction   . Other and unspecified hyperlipidemia   . Pain in joint, shoulder region   . Pain in limb   . Routine general medical examination at a health care facility   . Sciatica   . Type II or unspecified type diabetes mellitus without mention of complication, uncontrolled   . Unspecified essential hypertension   . Unspecified venous (peripheral) insufficiency     PAST SURGICAL HISTORY: Past Surgical History:  Procedure Laterality Date  . ANTERIOR CERVICAL DECOMP/DISCECTOMY FUSION N/A 12/22/2014   Procedure: Anterior Cervical Four-Five/Five-Six/Six-Seven Decompression/Diskectomy/ and Fsion;  Surgeon: Leeroy Cha, MD;  Location: [facility];  Service: Neurosurgery;  Laterality: N/A;  C4-5 C5-6 C6-7 Anterior cervical decompression/diskectomy/fusion  . LAPAROSCOPIC APPENDECTOMY    . LUMBAR LAMINECTOMY  07/2008   [place]  . TUBAL LIGATION      FAMILY HISTORY: Family History  Problem Relation Age of Onset  . Alzheimer's disease Mother   . Diabetes Mother   . Coronary artery disease Father        CABG  . Hypertension Father   . Cancer Brother   . Cancer Brother   . Colon cancer Neg Hx   . Breast cancer Neg Hx     SOCIAL HISTORY: Social History   Socioeconomic History  . Marital status: Married    Spouse name: [name]  . Number of children: 3  . Years of education: 11  . Highest education level: Not on file  Occupational History  . Not on file  Social Needs  . Financial resource strain: Not on file  . Food insecurity    Worry: Not on file    Inability: Not on file  . Transportation needs    Medical: Not on file     Non-medical: Not on file  Tobacco Use  . Smoking status: Former Smoker    Quit date: 05/23/1995    Years since quitting: 23.5  . Smokeless tobacco: Never Used  Substance and Sexual Activity  . Alcohol use: No  . Drug use: No  . Sexual activity: Yes    Partners: Male  Lifestyle  . Physical activity    Days per week: Not on file    Minutes per session: Not on file  . Stress: Not on file  Relationships  . Social Herbalist on phone: Not  on file    Gets together: Not on file    Attends religious service: Not on file    Active member of club or organization: Not on file    Attends meetings of clubs or organizations: Not on file    Relationship status: Not on file  . Intimate partner violence    Fear of current or ex partner: Not on file    Emotionally abused: Not on file    Physically abused: Not on file    Forced sexual activity: Not on file  Other Topics Concern  . Not on file  Social History Narrative   HSG.  Married '59.  2 sons - '65, '62; 1 dtr '61; 7 grandchildren; 3 great-grandchildren.  Lives w/husband in own home.  END OF LIFE: she does not want to be kept alive on machines. Needs more information in regard to CPR, etc. Provided w/[employer] guide and MOST (7/09)      PHYSICAL EXAM  There were no vitals filed for this visit. There is no height or weight on file to calculate BMI.  Generalized: Well developed, in no acute distress  MMSE - Mini Mental State Exam 11/25/2018 06/06/2018 12/03/2017  Not completed: - (No Data) -  Orientation to time 3 3 4   Orientation to Place 4 3 5   Registration 3 3 3   Attention/ Calculation 5 5 5   Recall 2 0 0  Language- name 2 objects 2 2 2   Language- repeat 1 1 1   Language- follow 3 step command 3 3 3   Language- read & follow direction 1 1 1   Write a sentence 1 1 1   Copy design 1 1 1   Total score 26 23 26     Neurological examination  Mentation: Alert oriented to time, place, history taking. Follows all commands speech  and language fluent Cranial nerve II-XII: Pupils were equal round reactive to light. Extraocular movements were full, visual field were full on confrontational test. Facial sensation and strength were normal. Uvula tongue midline. Head turning and shoulder shrug  were normal and symmetric. Motor: The motor testing reveals 4 over 5 strength of all 4 extremities. Good symmetric motor tone is noted throughout.  Sensory: Sensory testing is intact to soft touch on all 4 extremities. No evidence of extinction is noted.  Coordination: Cerebellar testing reveals good finger-nose-finger and heel-to-shin bilaterally.  Gait and station: Gait is normal. Tandem gait is normal. Romberg is negative. No drift is seen.  Reflexes: Deep tendon reflexes are symmetric and normal bilaterally.   DIAGNOSTIC DATA (LABS, IMAGING, TESTING) - I reviewed patient records, labs, notes, testing and imaging myself where available.  Lab Results  Component Value Date   WBC 8.3 09/26/2018   HGB 12.2 09/26/2018   HCT 36.7 09/26/2018   MCV 88.0 09/26/2018   PLT 383.0 09/26/2018      Component Value Date/Time   NA 139 09/26/2018 1104   K 3.8 09/26/2018 1104   CL 102 09/26/2018 1104   CO2 28 09/26/2018 1104   GLUCOSE 129 (H) 09/26/2018 1104   BUN 18 09/26/2018 1104   CREATININE 0.95 09/26/2018 1104   CALCIUM 9.3 09/26/2018 1104   PROT 7.3 09/26/2017 1127   ALBUMIN 4.1 09/26/2017 1127   AST 23 09/26/2017 1127   ALT 22 09/26/2017 1127   ALKPHOS 86 09/26/2017 1127   BILITOT 1.0 09/26/2017 1127   GFRNONAA 59 (L) 12/24/2014 0450   GFRAA >60 12/24/2014 0450   Lab Results  Component Value Date   CHOL  130 09/26/2017   HDL 29.70 (L) 09/26/2017   LDLCALC 62 04/21/2015   LDLDIRECT 80.0 09/26/2017   TRIG 209.0 (H) 09/26/2017   CHOLHDL 4 09/26/2017   Lab Results  Component Value Date   HGBA1C 6.9 (H) 09/26/2018   Lab Results  Component Value Date   VITAMINB12 602 10/30/2017   Lab Results  Component Value Date    TSH 2.09 09/26/2017      ASSESSMENT AND PLAN 78 y.o. year old female  has a past medical history of Contact dermatitis and other eczema, due to unspecified cause, Displacement of lumbar intervertebral disc without myelopathy, FH: cataracts, Gout, unspecified, Memory loss, Occlusion and stenosis of carotid artery without mention of cerebral infarction, Other and unspecified hyperlipidemia, Pain in joint, shoulder region, Pain in limb, Routine general medical examination at a health care facility, Sciatica, Type II or unspecified type diabetes mellitus without mention of complication, uncontrolled, Unspecified essential hypertension, and Unspecified venous (peripheral) insufficiency. here with:  1.  Memory loss  Her memory score has actually improved was 26 today, at last visit was 23.  Based on the history, there appears to have been some progression.  At times she has difficulty recognizing her husband, her family struggles to get her to take her medicines consistently.  She will remain on Aricept 10 mg daily.  We will increase her Namenda slowly to full dose.  At current, she is on 5 mg once daily.  I have provided titration instructions.  We discussed side effects of the medication.  In the future, we may consider Namzaric since she has a difficult time remembering to take her medicines, if financially feasible.  I have provided them with a memory packet.  We have discussed that she may benefit from a memory day program.  We have discussed the need for close monitoring of her driving.  She will continue taking melatonin at bedtime.  In the past, this has decreased her dreams.  She will follow-up in 6 months or sooner if needed.  I have advised that if her symptoms worsen or she develops any new symptoms she should let us know.  After completing the Namenda titration, they will call for new prescription.   I spent 15 minutes with the patient. 50% of this time was spent discussing her plan of care.    Butler Denmark, AGNP-C, DNP 11/25/2018, 8:06 AM [facility] [address] [place], [place] [address] [phone]

## 2018-11-25 ENCOUNTER — Encounter: Payer: Self-pay | Admitting: Neurology

## 2018-11-25 ENCOUNTER — Other Ambulatory Visit: Payer: Self-pay

## 2018-11-25 ENCOUNTER — Ambulatory Visit (INDEPENDENT_AMBULATORY_CARE_PROVIDER_SITE_OTHER): Payer: Medicare Other | Admitting: Neurology

## 2018-11-25 VITALS — BP 153/66 | HR 66 | Temp 97.7°F | Ht 62.0 in | Wt 140.0 lb

## 2018-11-25 DIAGNOSIS — F0390 Unspecified dementia without behavioral disturbance: Secondary | ICD-10-CM | POA: Diagnosis not present

## 2018-11-25 MED ORDER — MEMANTINE HCL 5 MG PO TABS: ORAL_TABLET | ORAL | 0 refills | Status: DC

## 2018-11-25 MED ORDER — DONEPEZIL HCL 10 MG PO TABS: 10.0000 mg | ORAL_TABLET | Freq: Every day | ORAL | 3 refills | Status: DC

## 2018-11-25 NOTE — Progress Notes (Signed)
I have read the note, and I agree with the clinical assessment and plan.  [name]   

## 2018-11-25 NOTE — Patient Instructions (Signed)
Take 1 tablet daily for one week, then take 1 tablet twice daily for one week, then take 1 tablet in the morning and 2 in the evening for one week, then take 2 tablets twice daily  Memantine Tablets What is this medicine? MEMANTINE (MEM an teen) is used to treat dementia caused by Alzheimer's disease. This medicine may be used for other purposes; ask your health care provider or pharmacist if you have questions. COMMON BRAND NAME(S): Namenda What should I tell my health care provider before I take this medicine? They need to know if you have any of these conditions:  difficulty passing urine  kidney disease  liver disease  seizures  an unusual or allergic reaction to memantine, other medicines, foods, dyes, or preservatives  pregnant or trying to get pregnant  breast-feeding How should I use this medicine? Take this medicine by mouth with a glass of water. Follow the directions on the prescription label. You may take this medicine with or without food. Take your doses at regular intervals. Do not take your medicine more often than directed. Continue to take your medicine even if you feel better. Do not stop taking except on the advice of your doctor or health care professional. Talk to your pediatrician regarding the use of this medicine in children. Special care may be needed. Overdosage: If you think you have taken too much of this medicine contact a poison control center or emergency room at once. NOTE: This medicine is only for you. Do not share this medicine with others. What if I miss a dose? If you miss a dose, take it as soon as you can. If it is almost time for your next dose, take only that dose. Do not take double or extra doses. If you do not take your medicine for several days, contact your health care provider. Your dose may need to be changed. What may interact with this  medicine?  acetazolamide  amantadine  cimetidine  dextromethorphan  dofetilide  hydrochlorothiazide  ketamine  metformin  methazolamide  quinidine  ranitidine  sodium bicarbonate  triamterene This list may not describe all possible interactions. Give your health care provider a list of all the medicines, herbs, non-prescription drugs, or dietary supplements you use. Also tell them if you smoke, drink alcohol, or use illegal drugs. Some items may interact with your medicine. What should I watch for while using this medicine? Visit your doctor or health care professional for regular checks on your progress. Check with your doctor or health care professional if there is no improvement in your symptoms or if they get worse. You may get drowsy or dizzy. Do not drive, use machinery, or do anything that needs mental alertness until you know how this drug affects you. Do not stand or sit up quickly, especially if you are an older patient. This reduces the risk of dizzy or fainting spells. Alcohol can make you more drowsy and dizzy. Avoid alcoholic drinks. What side effects may I notice from receiving this medicine? Side effects that you should report to your doctor or health care professional as soon as possible:  allergic reactions like skin rash, itching or hives, swelling of the face, lips, or tongue  agitation or a feeling of restlessness  depressed mood  dizziness  hallucinations  redness, blistering, peeling or loosening of the skin, including inside the mouth  seizures  vomiting Side effects that usually do not require medical attention (report to your doctor or health care professional if they  continue or are bothersome):  constipation  diarrhea  headache  nausea  trouble sleeping This list may not describe all possible side effects. Call your doctor for medical advice about side effects. You may report side effects to FDA at 1-800-FDA-1088. Where should I  keep my medicine? Keep out of the reach of children. Store at room temperature between 15 degrees and 30 degrees C (59 degrees and 86 degrees F). Throw away any unused medicine after the expiration date. NOTE: This sheet is a summary. It may not cover all possible information. If you have questions about this medicine, talk to your doctor, pharmacist, or health care provider.  2020 [employer]vier/Gold Standard (2013-02-24 14:10:42)

## 2018-12-10 ENCOUNTER — Ambulatory Visit: Payer: Self-pay | Admitting: Neurology

## 2018-12-10 ENCOUNTER — Ambulatory Visit: Payer: Medicare Other | Admitting: Adult Health

## 2019-01-15 DIAGNOSIS — H524 Presbyopia: Secondary | ICD-10-CM | POA: Diagnosis not present

## 2019-01-15 DIAGNOSIS — E119 Type 2 diabetes mellitus without complications: Secondary | ICD-10-CM | POA: Diagnosis not present

## 2019-01-15 DIAGNOSIS — Z794 Long term (current) use of insulin: Secondary | ICD-10-CM | POA: Diagnosis not present

## 2019-01-15 DIAGNOSIS — H5213 Myopia, bilateral: Secondary | ICD-10-CM | POA: Diagnosis not present

## 2019-01-15 DIAGNOSIS — H52223 Regular astigmatism, bilateral: Secondary | ICD-10-CM | POA: Diagnosis not present

## 2019-01-15 DIAGNOSIS — H25813 Combined forms of age-related cataract, bilateral: Secondary | ICD-10-CM | POA: Diagnosis not present

## 2019-01-15 DIAGNOSIS — H40013 Open angle with borderline findings, low risk, bilateral: Secondary | ICD-10-CM | POA: Diagnosis not present

## 2019-01-15 LAB — HM DIABETES EYE EXAM

## 2019-01-29 ENCOUNTER — Other Ambulatory Visit: Payer: Self-pay | Admitting: *Deleted

## 2019-01-29 MED ORDER — MEMANTINE HCL 10 MG PO TABS: 10.0000 mg | ORAL_TABLET | Freq: Two times a day (BID) | ORAL | 11 refills | Status: DC

## 2019-01-29 NOTE — Telephone Encounter (Signed)
Called daughter, [name] and she states pt tolerating medication.  I relayed will change to taking 1- 10mg  tablet BID.  She stated she understood.

## 2019-02-14 ENCOUNTER — Encounter: Payer: Self-pay | Admitting: Internal Medicine

## 2019-02-15 ENCOUNTER — Other Ambulatory Visit: Payer: Self-pay | Admitting: Internal Medicine

## 2019-04-03 ENCOUNTER — Ambulatory Visit: Payer: Medicare Other | Admitting: Internal Medicine

## 2019-05-06 ENCOUNTER — Other Ambulatory Visit: Payer: Self-pay | Admitting: Internal Medicine

## 2019-05-06 DIAGNOSIS — E785 Hyperlipidemia, unspecified: Secondary | ICD-10-CM

## 2019-05-06 MED ORDER — SIMVASTATIN 20 MG PO TABS: 20.0000 mg | ORAL_TABLET | Freq: Every day | ORAL | 1 refills | Status: DC

## 2019-06-01 NOTE — Progress Notes (Signed)
PATIENT: [name] DOB: 24-Oct-1940  REASON FOR VISIT: follow up HISTORY FROM: patient  HISTORY OF PRESENT ILLNESS: Today 06/02/19  [name] is a 79 year old female with history of memory disorder.  She lives with her husband.  She is able to perform her own ADLs.  She continues to drive a car.  She remains on Aricept and Namenda.  Her son and daughter live nearby.  They manage her medications.  They often have difficulty getting her to take medications.  She has not had any falls, but feels her balance is not as good.  She is able to keep up her housework.  She may occasionally have vivid dreams at bedtime.  During the night she reports she sleeps well, but often gets up to use the bathroom.  Her husband reports she wanders at night.  She has a good appetite.  In the last year she has lost 10 pounds.  She is a diabetic, but does not check her sugars at home.  She is not eating an especially good diet, drinking regular soda, and candy.  She presents today for evaluation accompanied by her daughter.  HISTORY 11/25/2018 SS: [name] is a 79 year old female with history of memory disorder. Her MMSE today was 26.  She lives with her husband, her son and daughter live nearby.  She is able to perform her ADLs.  Her daughter reports she is having trouble remembering to remove her contact lenses.  Her son manages her medications.  They report they have a hard time getting her to take her medications.  She does still drive.  There have not been any instances of her getting lost.  At times she has difficulty recognizing her husband.  She goes to bed early around 6:30 pm.  At times she will have vivid dreams.  On at least 2 occasions she has called her children around 8 pm, wondering where her husband is.  Both times, he has been home.  She reports her appetite is good, she does some cooking.  They mostly get takeout.  Since her last visit, she had a gout attack in her feet.  She presents today for follow-up  accompanied by her daughter.  She remains on Aricept and Namenda.   REVIEW OF SYSTEMS: Out of a complete 14 system review of symptoms, the patient complains only of the following symptoms, and all other reviewed systems are negative.  Memory loss, dizziness, numbness, weakness  ALLERGIES: Allergies  Allergen Reactions   Pneumococcal Vaccines Swelling    Per pt when received 2015   Ampicillin Other (See Comments)    REACTION: causes urticaria   Neomycin-Bacitracin Zn-Polymyx Rash   Penicillins Other (See Comments)    REACTION: causes urticaria    HOME MEDICATIONS: Outpatient Medications Prior to Visit  Medication Sig Dispense Refill   aspirin 81 MG EC tablet Take 1 tablet (81 mg total) by mouth daily. Swallow whole. 30 tablet 12   Colchicine 0.6 MG CAPS Take 1 capsule by mouth 2 (two) times a day. 60 capsule 1   donepezil (ARICEPT) 10 MG tablet Take 1 tablet (10 mg total) by mouth at bedtime. 90 tablet 3   hydrochlorothiazide (HYDRODIURIL) 25 MG tablet TAKE 1 TABLET(25 MG) BY MOUTH DAILY 90 tablet 0   irbesartan (AVAPRO) 150 MG tablet TAKE 1 TABLET(150 MG) BY MOUTH DAILY 90 tablet 1   memantine (NAMENDA) 10 MG tablet Take 1 tablet (10 mg total) by mouth 2 (two) times daily. [place]  tablet 11   metFORMIN (GLUCOPHAGE) 500 MG tablet TAKE 1 TABLET(500 MG) BY MOUTH TWICE DAILY WITH A MEAL 180 tablet 1   propranolol (INDERAL) 20 MG tablet TAKE 1 TABLET(20 MG) BY MOUTH TWICE DAILY 180 tablet 1   simvastatin (ZOCOR) 20 MG tablet Take 1 tablet (20 mg total) by mouth daily at 6 PM. 90 tablet 1   No facility-administered medications prior to visit.    PAST MEDICAL HISTORY: Past Medical History:  Diagnosis Date   Contact dermatitis and other eczema, due to unspecified cause    Displacement of lumbar intervertebral disc without myelopathy    FH: cataracts    Gout, unspecified    Memory loss    Occlusion and stenosis of carotid artery without mention of cerebral infarction      Other and unspecified hyperlipidemia    Pain in joint, shoulder region    Pain in limb    Routine general medical examination at a health care facility    Sciatica    Type II or unspecified type diabetes mellitus without mention of complication, uncontrolled    Unspecified essential hypertension    Unspecified venous (peripheral) insufficiency     PAST SURGICAL HISTORY: Past Surgical History:  Procedure Laterality Date   ANTERIOR CERVICAL DECOMP/DISCECTOMY FUSION N/A 12/22/2014   Procedure: Anterior Cervical Four-Five/Five-Six/Six-Seven Decompression/Diskectomy/ and Fsion;  Surgeon: Leeroy Cha, MD;  Location: [facility];  Service: Neurosurgery;  Laterality: N/A;  C4-5 C5-6 C6-7 Anterior cervical decompression/diskectomy/fusion   LAPAROSCOPIC APPENDECTOMY     LUMBAR LAMINECTOMY  07/2008   [place]   TUBAL LIGATION      FAMILY HISTORY: Family History  Problem Relation Age of Onset   Alzheimer's disease Mother    Diabetes Mother    Coronary artery disease Father        CABG   Hypertension Father    Cancer Brother    Cancer Brother    Colon cancer Neg Hx    Breast cancer Neg Hx     SOCIAL HISTORY: Social History   Socioeconomic History   Marital status: Married    Spouse name: Jimmy   Number of children: 3   Years of education: 12   Highest education level: Not on file  Occupational History   Not on file  Tobacco Use   Smoking status: Former Smoker    Quit date: 05/23/1995    Years since quitting: 24.0   Smokeless tobacco: Never Used  Substance and Sexual Activity   Alcohol use: No   Drug use: No   Sexual activity: Yes    Partners: Male  Other Topics Concern   Not on file  Social History Narrative   HSG.  Married '59.  2 sons - '65, '62; 1 dtr '61; 7 grandchildren; 3 great-grandchildren.  Lives w/husband in own home.  END OF LIFE: she does not want to be kept alive on machines. Needs more information in regard to CPR, etc.  Provided w/[employer] and [employer] (7/09)   Social Determinants of Health   Financial Resource Strain:    Difficulty of Paying Living Expenses: Not on file  Food Insecurity:    Worried About [place] in the Last Year: Not on file   [employer] of Food in the Last Year: Not on file  Transportation Needs:    Lack of Transportation (Medical): Not on file   Lack of Transportation (Non-Medical): Not on file  Physical Activity:    Days of Exercise per Week: Not  on file   Minutes of Exercise per Session: Not on file  Stress:    Feeling of Stress : Not on file  Social Connections:    Frequency of Communication with Friends and Family: Not on file   Frequency of Social Gatherings with Friends and Family: Not on file   Attends Religious Services: Not on file   Active Member of Clubs or Organizations: Not on file   Attends Archivist Meetings: Not on file   Marital Status: Not on file  Intimate Partner Violence:    Fear of Current or Ex-Partner: Not on file   Emotionally Abused: Not on file   Physically Abused: Not on file   Sexually Abused: Not on file      PHYSICAL EXAM  Vitals:   06/02/19 0917  Weight: 135 lb (61.2 kg)  Height: 5\' 2"  (1.575 m)   Body mass index is 24.69 kg/m.  Generalized: Well developed, in no acute distress  MMSE - Mini Mental State Exam 06/02/2019 11/25/2018 06/06/2018  Not completed: (No Data) - (No Data)  Orientation to time 0 3 3  Orientation to Place 3 4 3   Registration 3 3 3   Attention/ Calculation 0 5 5  Recall 0 2 0  Language- name 2 objects 2 2 2   Language- repeat 1 1 1   Language- follow 3 step command 3 3 3   Language- read & follow direction 1 1 1   Write a sentence 1 1 1   Copy design 1 1 1   Total score 15 26 23     Neurological examination  Mentation: Alert, able to provide history, collaborated by daughter. Follows all commands speech and language fluent Cranial nerve II-XII: Pupils were equal round  reactive to light. Extraocular movements were full, visual field were full on confrontational test. Facial sensation and strength were normal. Head turning and shoulder shrug  were normal and symmetric. Motor: Good strength of all extremities Sensory: Sensory testing is intact to soft touch on all 4 extremities. No evidence of extinction is noted.  Coordination: Cerebellar testing reveals good finger-nose-finger and heel-to-shin bilaterally.  Gait and station: Gait is normal, but cautious. Tandem gait is unsteady. Romberg is negative. No drift is seen.  Reflexes: Deep tendon reflexes are symmetric   DIAGNOSTIC DATA (LABS, IMAGING, TESTING) - I reviewed patient records, labs, notes, testing and imaging myself where available.  Lab Results  Component Value Date   WBC 8.3 09/26/2018   HGB 12.2 09/26/2018   HCT 36.7 09/26/2018   MCV 88.0 09/26/2018   PLT 383.0 09/26/2018      Component Value Date/Time   NA 139 09/26/2018 1104   K 3.8 09/26/2018 1104   CL 102 09/26/2018 1104   CO2 28 09/26/2018 1104   GLUCOSE 129 (H) 09/26/2018 1104   BUN 18 09/26/2018 1104   CREATININE 0.95 09/26/2018 1104   CALCIUM 9.3 09/26/2018 1104   PROT 7.3 09/26/2017 1127   ALBUMIN 4.1 09/26/2017 1127   AST 23 09/26/2017 1127   ALT 22 09/26/2017 1127   ALKPHOS 86 09/26/2017 1127   BILITOT 1.0 09/26/2017 1127   GFRNONAA 59 (L) 12/24/2014 0450   GFRAA >60 12/24/2014 0450   Lab Results  Component Value Date   CHOL 130 09/26/2017   HDL 29.70 (L) 09/26/2017   LDLCALC 62 04/21/2015   LDLDIRECT 80.0 09/26/2017   TRIG 209.0 (H) 09/26/2017   CHOLHDL 4 09/26/2017   Lab Results  Component Value Date   HGBA1C 6.9 (H) 09/26/2018  Lab Results  Component Value Date   VITAMINB12 602 10/30/2017   Lab Results  Component Value Date   TSH 2.09 09/26/2017   ASSESSMENT AND PLAN 79 y.o. year old female  has a past medical history of Contact dermatitis and other eczema, due to unspecified cause, Displacement of  lumbar intervertebral disc without myelopathy, FH: cataracts, Gout, unspecified, Memory loss, Occlusion and stenosis of carotid artery without mention of cerebral infarction, Other and unspecified hyperlipidemia, Pain in joint, shoulder region, Pain in limb, Routine general medical examination at a health care facility, Sciatica, Type II or unspecified type diabetes mellitus without mention of complication, uncontrolled, Unspecified essential hypertension, and Unspecified venous (peripheral) insufficiency. here with:  1.  Memory loss  She has had a fairly significant decline in her memory score, was 15/30 today, 6 months ago was 26/30.  She will remain on Aricept and Namenda.  We discussed adjusting the regimen, due to difficulty getting her to take her pills, possibly switching to Namzaric or adding Aricept in the morning.  For now, they have decided to keep medications as they are, but discussed with the patient the meds work best when taken as prescribed.  I will monitor her weight, in the last year she has lost 10 pounds, now weighs 135 pounds.  For her sleep, I will have her take melatonin.  We may consider trazodone or mirtazapine in the future if needed.  I have suggested she not operate a motor vehicle at this time. Follow-up with PCP for a check on her blood sugars. She will follow-up here in 6 months or sooner if needed.  I did advise if her symptoms worsen or she develops any new symptoms she should let us know.   I spent 15 minutes with the patient. 50% of this time was spent discussing her plan of care.   Butler Denmark, AGNP-C, DNP 06/02/2019, 9:22 AM [facility] [address] [place], [place] [address] [phone]

## 2019-06-02 ENCOUNTER — Ambulatory Visit (INDEPENDENT_AMBULATORY_CARE_PROVIDER_SITE_OTHER): Payer: Medicare Other | Admitting: Neurology

## 2019-06-02 ENCOUNTER — Other Ambulatory Visit: Payer: Self-pay

## 2019-06-02 ENCOUNTER — Encounter: Payer: Self-pay | Admitting: Neurology

## 2019-06-02 VITALS — BP 116/70 | HR 76 | Temp 97.4°F | Ht 62.0 in | Wt 135.0 lb

## 2019-06-02 DIAGNOSIS — F0390 Unspecified dementia without behavioral disturbance: Secondary | ICD-10-CM | POA: Diagnosis not present

## 2019-06-02 NOTE — Patient Instructions (Signed)
Continue Aricept and Namenda, follow-up with primary doctor for a check on your blood sugar.

## 2019-06-02 NOTE — Progress Notes (Signed)
I have read the note, and I agree with the clinical assessment and plan.  Leiana Rund K Stuart Guillen   

## 2019-07-24 ENCOUNTER — Other Ambulatory Visit: Payer: Self-pay | Admitting: Internal Medicine

## 2019-07-24 DIAGNOSIS — E1140 Type 2 diabetes mellitus with diabetic neuropathy, unspecified: Secondary | ICD-10-CM

## 2019-08-06 ENCOUNTER — Other Ambulatory Visit: Payer: Self-pay | Admitting: Internal Medicine

## 2019-08-06 DIAGNOSIS — I10 Essential (primary) hypertension: Secondary | ICD-10-CM

## 2019-08-06 MED ORDER — HYDROCHLOROTHIAZIDE 25 MG PO TABS: ORAL_TABLET | ORAL | 1 refills | Status: DC

## 2019-08-07 ENCOUNTER — Telehealth: Payer: Self-pay | Admitting: Internal Medicine

## 2019-08-07 DIAGNOSIS — I10 Essential (primary) hypertension: Secondary | ICD-10-CM

## 2019-08-07 MED ORDER — HYDROCHLOROTHIAZIDE 25 MG PO TABS: 25.0000 mg | ORAL_TABLET | Freq: Every day | ORAL | 0 refills | Status: DC

## 2019-08-07 NOTE — Telephone Encounter (Signed)
New message:   1.Medication Requested: hydrochlorothiazide (HYDRODIURIL) 25 MG tablet 2. Pharmacy (Name, Street, [place]): [place] [place] [facility] 3. On Med List: yes  4. Last Visit with PCP: 10/01/18  5. Next visit date with PCP:08/13/19   Agent: Please be advised that RX refills may take up to 3 business days. We ask that you follow-up with your pharmacy.

## 2019-08-07 NOTE — Telephone Encounter (Signed)
erx for 30 day supply has been sent.

## 2019-08-09 ENCOUNTER — Other Ambulatory Visit: Payer: Self-pay | Admitting: Internal Medicine

## 2019-08-09 DIAGNOSIS — I10 Essential (primary) hypertension: Secondary | ICD-10-CM

## 2019-08-13 ENCOUNTER — Encounter: Payer: Self-pay | Admitting: Internal Medicine

## 2019-08-13 ENCOUNTER — Ambulatory Visit (INDEPENDENT_AMBULATORY_CARE_PROVIDER_SITE_OTHER): Payer: Medicare Other | Admitting: Internal Medicine

## 2019-08-13 ENCOUNTER — Other Ambulatory Visit: Payer: Self-pay

## 2019-08-13 VITALS — BP 140/72 | HR 74 | Temp 98.1°F | Ht 62.0 in | Wt 135.0 lb

## 2019-08-13 DIAGNOSIS — E1140 Type 2 diabetes mellitus with diabetic neuropathy, unspecified: Secondary | ICD-10-CM | POA: Diagnosis not present

## 2019-08-13 DIAGNOSIS — E785 Hyperlipidemia, unspecified: Secondary | ICD-10-CM

## 2019-08-13 DIAGNOSIS — N1831 Chronic kidney disease, stage 3a: Secondary | ICD-10-CM | POA: Diagnosis not present

## 2019-08-13 DIAGNOSIS — I10 Essential (primary) hypertension: Secondary | ICD-10-CM

## 2019-08-13 DIAGNOSIS — Z23 Encounter for immunization: Secondary | ICD-10-CM

## 2019-08-13 LAB — URINALYSIS, ROUTINE W REFLEX MICROSCOPIC
Bilirubin Urine: NEGATIVE
Ketones, ur: NEGATIVE
Nitrite: POSITIVE — AB
Specific Gravity, Urine: 1.02 (ref 1.000–1.030)
Total Protein, Urine: 30 — AB
Urine Glucose: NEGATIVE
Urobilinogen, UA: 0.2 (ref 0.0–1.0)
pH: 6.5 (ref 5.0–8.0)

## 2019-08-13 LAB — TSH: TSH: 2.96 u[IU]/mL (ref 0.35–4.50)

## 2019-08-13 LAB — CBC WITH DIFFERENTIAL/PLATELET
Basophils Absolute: 0.1 10*3/uL (ref 0.0–0.1)
Basophils Relative: 0.8 % (ref 0.0–3.0)
Eosinophils Absolute: 0.7 10*3/uL (ref 0.0–0.7)
Eosinophils Relative: 7 % — ABNORMAL HIGH (ref 0.0–5.0)
HCT: 36.9 % (ref 36.0–46.0)
Hemoglobin: 12.2 g/dL (ref 12.0–15.0)
Lymphocytes Relative: 26 % (ref 12.0–46.0)
Lymphs Abs: 2.5 10*3/uL (ref 0.7–4.0)
MCHC: 33.1 g/dL (ref 30.0–36.0)
MCV: 88.7 fl (ref 78.0–100.0)
Monocytes Absolute: 0.8 10*3/uL (ref 0.1–1.0)
Monocytes Relative: 8 % (ref 3.0–12.0)
Neutro Abs: 5.6 10*3/uL (ref 1.4–7.7)
Neutrophils Relative %: 58.2 % (ref 43.0–77.0)
Platelets: 380 10*3/uL (ref 150.0–400.0)
RBC: 4.16 Mil/uL (ref 3.87–5.11)
RDW: 14.1 % (ref 11.5–15.5)
WBC: 9.7 10*3/uL (ref 4.0–10.5)

## 2019-08-13 LAB — BASIC METABOLIC PANEL
BUN: 27 mg/dL — ABNORMAL HIGH (ref 6–23)
CO2: 30 mEq/L (ref 19–32)
Calcium: 10.1 mg/dL (ref 8.4–10.5)
Chloride: 100 mEq/L (ref 96–112)
Creatinine, Ser: 0.97 mg/dL (ref 0.40–1.20)
GFR: 55.28 mL/min — ABNORMAL LOW (ref >60.00)
Glucose, Bld: 123 mg/dL — ABNORMAL HIGH (ref 70–99)
Potassium: 4 mEq/L (ref 3.5–5.1)
Sodium: 138 mEq/L (ref 135–145)

## 2019-08-13 LAB — HEPATIC FUNCTION PANEL
ALT: 42 U/L — ABNORMAL HIGH (ref 0–35)
AST: 30 U/L (ref 0–37)
Albumin: 4.2 g/dL (ref 3.5–5.2)
Alkaline Phosphatase: 132 U/L — ABNORMAL HIGH (ref 39–117)
Bilirubin, Direct: 0.2 mg/dL (ref 0.0–0.3)
Total Bilirubin: 1 mg/dL (ref 0.2–1.2)
Total Protein: 7.8 g/dL (ref 6.0–8.3)

## 2019-08-13 LAB — POCT GLUCOSE (DEVICE FOR HOME USE): Glucose Fasting, POC: 155 mg/dL — AB (ref 70–99)

## 2019-08-13 LAB — LIPID PANEL
Cholesterol: 132 mg/dL (ref 0–200)
HDL: 35.4 mg/dL — ABNORMAL LOW (ref >39.00)
LDL Cholesterol: 65 mg/dL (ref 0–99)
NonHDL: 96.16
Total CHOL/HDL Ratio: 4
Triglycerides: 155 mg/dL — ABNORMAL HIGH (ref 0.0–149.0)
VLDL: 31 mg/dL (ref 0.0–40.0)

## 2019-08-13 LAB — POCT GLYCOSYLATED HEMOGLOBIN (HGB A1C): Hemoglobin A1C: 6.7 % — AB (ref 4.0–5.6)

## 2019-08-13 MED ORDER — IRBESARTAN 150 MG PO TABS: ORAL_TABLET | ORAL | 1 refills | Status: DC

## 2019-08-13 MED ORDER — HYDROCHLOROTHIAZIDE 25 MG PO TABS: 25.0000 mg | ORAL_TABLET | Freq: Every day | ORAL | 1 refills | Status: DC

## 2019-08-13 NOTE — Progress Notes (Signed)
Subjective:  Patient ID: [name], female    DOB: Mar 22, 1941  Age: 79 y.o. MRN: [id number]  CC: Hypertension, Hyperlipidemia, and Diabetes  This visit occurred during the SARS-CoV-2 public health emergency.  Safety protocols were in place, including screening questions prior to the visit, additional usage of staff PPE, and extensive cleaning of exam room while observing appropriate contact time as indicated for disinfecting solutions.    HPI REWA DECKELMAN presents for f/up - She is with her daughter today.  They tell me she is in her usual state of health.  She has had a good appetite and denies any recent episodes of chest pain, shortness of breath, cough, or abdominal pain.  Outpatient Medications Prior to Visit  Medication Sig Dispense Refill  . aspirin 81 MG EC tablet Take 1 tablet (81 mg total) by mouth daily. Swallow whole. 30 tablet 12  . Colchicine 0.6 MG CAPS Take 1 capsule by mouth 2 (two) times a day. 60 capsule 1  . donepezil (ARICEPT) 10 MG tablet Take 1 tablet (10 mg total) by mouth at bedtime. 90 tablet 3  . memantine (NAMENDA) 10 MG tablet Take 1 tablet (10 mg total) by mouth 2 (two) times daily. 60 tablet 11  . metFORMIN (GLUCOPHAGE) 500 MG tablet TAKE 1 TABLET(500 MG) BY MOUTH TWICE DAILY WITH A MEAL 180 tablet 1  . propranolol (INDERAL) 20 MG tablet TAKE 1 TABLET(20 MG) BY MOUTH TWICE DAILY 180 tablet 1  . simvastatin (ZOCOR) 20 MG tablet Take 1 tablet (20 mg total) by mouth daily at 6 PM. 90 tablet 1  . hydrochlorothiazide (HYDRODIURIL) 25 MG tablet Take 1 tablet (25 mg total) by mouth daily. 30 tablet 0  . irbesartan (AVAPRO) 150 MG tablet TAKE 1 TABLET(150 MG) BY MOUTH DAILY 90 tablet 1   No facility-administered medications prior to visit.    ROS Review of Systems  Constitutional: Negative for appetite change, diaphoresis, fatigue and unexpected weight change.  HENT: Negative.   Eyes: Negative for visual disturbance.  Respiratory: Negative for cough, chest  tightness, shortness of breath and wheezing.   Cardiovascular: Negative for chest pain, palpitations and leg swelling.  Gastrointestinal: Negative for abdominal pain, constipation, diarrhea, nausea and vomiting.  Endocrine: Negative.  Negative for polydipsia, polyphagia and polyuria.  Genitourinary: Negative.  Negative for decreased urine volume, difficulty urinating, dysuria, frequency and hematuria.  Musculoskeletal: Negative for arthralgias and myalgias.  Skin: Negative.  Negative for color change.  Neurological: Negative.  Negative for dizziness and weakness.  Hematological: Negative for adenopathy. Does not bruise/bleed easily.  Psychiatric/Behavioral: Negative.     Objective:  BP 140/72 (BP Location: Left Arm, Patient Position: Sitting, Cuff Size: Normal)   Pulse 74   Temp 98.1 F (36.7 C) (Oral)   Ht 5\' 2"  (1.575 m)   Wt 135 lb (61.2 kg)   SpO2 98%   BMI 24.69 kg/m   BP Readings from Last 3 Encounters:  08/13/19 140/72  06/02/19 116/70  11/25/18 (!) 153/66    Wt Readings from Last 3 Encounters:  08/13/19 135 lb (61.2 kg)  06/02/19 135 lb (61.2 kg)  11/25/18 140 lb (63.5 kg)    Physical Exam Vitals reviewed.  HENT:     Mouth/Throat:     Mouth: Mucous membranes are moist.  Eyes:     General: No scleral icterus.    Conjunctiva/sclera: Conjunctivae normal.  Cardiovascular:     Rate and Rhythm: Normal rate and regular rhythm.     Heart  sounds: No murmur.  Pulmonary:     Effort: Pulmonary effort is normal.     Breath sounds: No stridor. No wheezing, rhonchi or rales.  Abdominal:     General: Abdomen is flat. Bowel sounds are normal.     Palpations: There is no hepatomegaly, splenomegaly or mass.  Musculoskeletal:        General: Normal range of motion.     Cervical back: Neck supple.     Right lower leg: No edema.     Left lower leg: No edema.  Lymphadenopathy:     Cervical: No cervical adenopathy.  Skin:    General: Skin is warm and dry.  Neurological:       General: No focal deficit present.  Psychiatric:        Mood and Affect: Mood normal.        Behavior: Behavior normal.     Lab Results  Component Value Date   WBC 9.7 08/13/2019   HGB 12.2 08/13/2019   HCT 36.9 08/13/2019   PLT 380.0 08/13/2019   GLUCOSE 123 (H) 08/13/2019   CHOL 132 08/13/2019   TRIG 155.0 (H) 08/13/2019   HDL 35.40 (L) 08/13/2019   LDLDIRECT 80.0 09/26/2017   LDLCALC 65 08/13/2019   ALT 42 (H) 08/13/2019   AST 30 08/13/2019   NA 138 08/13/2019   K 4.0 08/13/2019   CL 100 08/13/2019   CREATININE 0.97 08/13/2019   BUN 27 (H) 08/13/2019   CO2 30 08/13/2019   TSH 2.96 08/13/2019   HGBA1C 6.7 (A) 08/13/2019   MICROALBUR 1.6 09/26/2018    DG Foot Complete Right  Result Date: 09/26/2018 CLINICAL DATA:  Right third digit pain since yesterday, atraumatic. EXAM: RIGHT FOOT COMPLETE - 3+ VIEW COMPARISON:  None. FINDINGS: No acute fracture, dislocation, or joint erosion. There is periarticular calcification about the first metatarsal head. Patient has history of gout. Dorsal forefoot soft tissue swelling. IMPRESSION: 1. No acute osseous findings. 2. Calcification about the first MTP joint, likely gouty. Electronically Signed   By: Monte Fantasia M.D.   On: 09/26/2018 10:44    Assessment & Plan:   Zoann was seen today for hypertension, hyperlipidemia and diabetes.  Diagnoses and all orders for this visit:  Essential hypertension- Her BP is well controlled, lytes and renal function are okay. -     Basic metabolic panel; Future -     TSH; Future -     Urinalysis, Routine w reflex microscopic; Future -     CBC with Differential/Platelet; Future -     CBC with Differential/Platelet -     Urinalysis, Routine w reflex microscopic -     TSH -     Basic metabolic panel -     hydrochlorothiazide (HYDRODIURIL) 25 MG tablet; Take 1 tablet (25 mg total) by mouth daily. -     irbesartan (AVAPRO) 150 MG tablet; TAKE 1 TABLET(150 MG) BY MOUTH DAILY  Controlled type  2 diabetes mellitus with diabetic neuropathy, without long-term current use of insulin ([place])- Her blood sugar is adequately well controlled. -     Cancel: Hemoglobin A1c; Future -     POCT glycosylated hemoglobin (Hb A1C) -     POCT Glucose (Device for Home Use) -     irbesartan (AVAPRO) 150 MG tablet; TAKE 1 TABLET(150 MG) BY MOUTH DAILY  Stage 3a chronic kidney disease- Her renal function is stable. -     Basic metabolic panel; Future -     CBC with  Differential/Platelet; Future -     CBC with Differential/Platelet -     Basic metabolic panel  Dyslipidemia, goal LDL below 70- She has achieved her LDL goal and is doing well on the statin. -     Lipid panel; Future -     TSH; Future -     Hepatic function panel; Future -     Hepatic function panel -     TSH -     Lipid panel  Need for influenza vaccination -     Flu Vaccine QUAD High Dose(Fluad)  Need for pneumococcal vaccination -     Pneumococcal polysaccharide vaccine 23-valent greater than or equal to 2yo subcutaneous/IM   I am having Dan J. Shires maintain her aspirin, Colchicine, propranolol, donepezil, memantine, simvastatin, metFORMIN, hydrochlorothiazide, and irbesartan.  Meds ordered this encounter  Medications  . hydrochlorothiazide (HYDRODIURIL) 25 MG tablet    Sig: Take 1 tablet (25 mg total) by mouth daily.    Dispense:  90 tablet    Refill:  1  . irbesartan (AVAPRO) 150 MG tablet    Sig: TAKE 1 TABLET(150 MG) BY MOUTH DAILY    Dispense:  90 tablet    Refill:  1     Follow-up: No follow-ups on file.  Scarlette Calico, MD

## 2019-08-17 ENCOUNTER — Other Ambulatory Visit: Payer: Self-pay | Admitting: Internal Medicine

## 2019-08-17 DIAGNOSIS — H6981 Other specified disorders of Eustachian tube, right ear: Secondary | ICD-10-CM

## 2019-08-17 DIAGNOSIS — E1140 Type 2 diabetes mellitus with diabetic neuropathy, unspecified: Secondary | ICD-10-CM

## 2019-08-17 DIAGNOSIS — I10 Essential (primary) hypertension: Secondary | ICD-10-CM

## 2019-10-29 ENCOUNTER — Other Ambulatory Visit: Payer: Self-pay | Admitting: Internal Medicine

## 2019-10-29 DIAGNOSIS — I10 Essential (primary) hypertension: Secondary | ICD-10-CM

## 2019-11-18 ENCOUNTER — Other Ambulatory Visit: Payer: Self-pay | Admitting: Internal Medicine

## 2019-11-18 DIAGNOSIS — E785 Hyperlipidemia, unspecified: Secondary | ICD-10-CM

## 2019-12-01 ENCOUNTER — Other Ambulatory Visit: Payer: Self-pay

## 2019-12-01 ENCOUNTER — Ambulatory Visit (INDEPENDENT_AMBULATORY_CARE_PROVIDER_SITE_OTHER): Payer: Medicare Other | Admitting: Neurology

## 2019-12-01 ENCOUNTER — Encounter: Payer: Self-pay | Admitting: Neurology

## 2019-12-01 VITALS — BP 129/73 | HR 74 | Ht 62.0 in | Wt 139.0 lb

## 2019-12-01 DIAGNOSIS — F0390 Unspecified dementia without behavioral disturbance: Secondary | ICD-10-CM | POA: Diagnosis not present

## 2019-12-01 MED ORDER — DONEPEZIL HCL 10 MG PO TABS: 10.0000 mg | ORAL_TABLET | Freq: Every day | ORAL | 3 refills | Status: DC

## 2019-12-01 MED ORDER — MEMANTINE HCL 10 MG PO TABS: 10.0000 mg | ORAL_TABLET | Freq: Two times a day (BID) | ORAL | 11 refills | Status: DC

## 2019-12-01 NOTE — Patient Instructions (Signed)
We will continue current medications, try taking the Aricept in the morning to see if that helps with the dreaming at night  See you back in 6 months

## 2019-12-01 NOTE — Progress Notes (Signed)
PATIENT: [name] DOB: Aug 27, 1940  REASON FOR VISIT: follow up HISTORY FROM: patient  HISTORY OF PRESENT ILLNESS: Today 12/01/19  [name] is an 79 year old female with history of memory disorder.  She is on Aricept and Namenda. She lives with her husband, her son and daughter live nearby and check in on her. MMSE 15/30 today, stable from 6 months ago. Weight is stable, up 4 lbs from 6 months ago at 139 lbs. appetite is good, she often forgets when she eats, may eat again.  Is having difficulty sleeping at night, her husband will hear her yelling out, he has to go wake her up.  She is no longer driving, gets irritated at her children for this.  Sometimes her husband has difficulty getting her to take her morning pills.  She does not do any cooking, her husband has to watch her in the kitchen, she may turn the water on and walk away.  She is able to do housework, laundry. She may be irritable in the mornings. She does her own ADLs.  She enjoys watching TV and sitting outside in the carport, no wandering. She loves her 22 year old dog, [name].  Presents today accompanied by her husband and son.  HISTORY 06/02/2019 SS: [name] is a 79 year old female with history of memory disorder.  She lives with her husband.  She is able to perform her own ADLs.  She continues to drive a car.  She remains on Aricept and Namenda.  Her son and daughter live nearby.  They manage her medications.  They often have difficulty getting her to take medications.  She has not had any falls, but feels her balance is not as good.  She is able to keep up her housework.  She may occasionally have vivid dreams at bedtime.  During the night she reports she sleeps well, but often gets up to use the bathroom.  Her husband reports she wanders at night.  She has a good appetite.  In the last year she has lost 10 pounds.  She is a diabetic, but does not check her sugars at home.  She is not eating an especially good diet, drinking  regular soda, and candy.  She presents today for evaluation accompanied by her daughter.   REVIEW OF SYSTEMS: Out of a complete 14 system review of symptoms, the patient complains only of the following symptoms, and all other reviewed systems are negative.  Memory loss  ALLERGIES: Allergies  Allergen Reactions   Pneumococcal Vaccines Swelling    Per pt when received 2015   Ampicillin Other (See Comments)    REACTION: causes urticaria   Neomycin-Bacitracin Zn-Polymyx Rash   Penicillins Other (See Comments)    REACTION: causes urticaria    HOME MEDICATIONS: Outpatient Medications Prior to Visit  Medication Sig Dispense Refill   aspirin 81 MG EC tablet Take 1 tablet (81 mg total) by mouth daily. Swallow whole. 30 tablet 12   Colchicine 0.6 MG CAPS Take 1 capsule by mouth 2 (two) times a day. 60 capsule 1   donepezil (ARICEPT) 10 MG tablet Take 1 tablet (10 mg total) by mouth at bedtime. 90 tablet 3   hydrochlorothiazide (HYDRODIURIL) 25 MG tablet Take 1 tablet (25 mg total) by mouth daily. 90 tablet 1   irbesartan (AVAPRO) 150 MG tablet TAKE 1 TABLET(150 MG) BY MOUTH DAILY 90 tablet 1   levocetirizine (XYZAL) 5 MG tablet TAKE 1 TABLET(5 MG) BY MOUTH EVERY EVENING 90 tablet  1   memantine (NAMENDA) 10 MG tablet Take 1 tablet (10 mg total) by mouth 2 (two) times daily. 60 tablet 11   metFORMIN (GLUCOPHAGE) 500 MG tablet TAKE 1 TABLET(500 MG) BY MOUTH TWICE DAILY WITH A MEAL 180 tablet 1   propranolol (INDERAL) 20 MG tablet TAKE 1 TABLET(20 MG) BY MOUTH TWICE DAILY 180 tablet 0   simvastatin (ZOCOR) 20 MG tablet TAKE 1 TABLET(20 MG) BY MOUTH DAILY AT 6 PM 90 tablet 1   No facility-administered medications prior to visit.    PAST MEDICAL HISTORY: Past Medical History:  Diagnosis Date   Contact dermatitis and other eczema, due to unspecified cause    Displacement of lumbar intervertebral disc without myelopathy    FH: cataracts    Gout, unspecified    Memory loss      Occlusion and stenosis of carotid artery without mention of cerebral infarction    Other and unspecified hyperlipidemia    Pain in joint, shoulder region    Pain in limb    Routine general medical examination at a health care facility    Sciatica    Type II or unspecified type diabetes mellitus without mention of complication, uncontrolled    Unspecified essential hypertension    Unspecified venous (peripheral) insufficiency     PAST SURGICAL HISTORY: Past Surgical History:  Procedure Laterality Date   ANTERIOR CERVICAL DECOMP/DISCECTOMY FUSION N/A 12/22/2014   Procedure: Anterior Cervical Four-Five/Five-Six/Six-Seven Decompression/Diskectomy/ and Fsion;  Surgeon: Leeroy Cha, MD;  Location: [facility];  Service: Neurosurgery;  Laterality: N/A;  C4-5 C5-6 C6-7 Anterior cervical decompression/diskectomy/fusion   LAPAROSCOPIC APPENDECTOMY     LUMBAR LAMINECTOMY  07/2008   [place]   TUBAL LIGATION      FAMILY HISTORY: Family History  Problem Relation Age of Onset   Alzheimer's disease Mother    Diabetes Mother    Coronary artery disease Father        CABG   Hypertension Father    Cancer Brother    Cancer Brother    Colon cancer Neg Hx    Breast cancer Neg Hx     SOCIAL HISTORY: Social History   Socioeconomic History   Marital status: Married    Spouse name: Jimmy   Number of children: 3   Years of education: 12   Highest education level: Not on file  Occupational History   Not on file  Tobacco Use   Smoking status: Former Smoker    Quit date: 05/23/1995    Years since quitting: 24.5   Smokeless tobacco: Never Used  Substance and Sexual Activity   Alcohol use: No   Drug use: No   Sexual activity: Yes    Partners: Male  Other Topics Concern   Not on file  Social History Narrative   HSG.  Married '59.  2 sons - '65, '62; 1 dtr '61; 7 grandchildren; 3 great-grandchildren.  Lives w/husband in own home.  END OF LIFE: she does not  want to be kept alive on machines. Needs more information in regard to CPR, etc. Provided w/[employer] and [employer] (7/09)   Social Determinants of Health   Financial Resource Strain:    Difficulty of Paying Living Expenses:   Food Insecurity:    Worried About Charity fundraiser in the Last Year:    Arboriculturist in the Last Year:   Transportation Needs:    Lack of Transportation (Medical):    Lack of Transportation (Non-Medical):   Physical Activity:  Days of Exercise per Week:    Minutes of Exercise per Session:   Stress:    Feeling of Stress :   Social Connections:    Frequency of Communication with Friends and Family:    Frequency of Social Gatherings with Friends and Family:    Attends Religious Services:    Active Member of Clubs or Organizations:    Attends Archivist Meetings:    Marital Status:   Intimate Partner Violence:    Fear of Current or Ex-Partner:    Emotionally Abused:    Physically Abused:    Sexually Abused:     PHYSICAL EXAM  Vitals:   12/01/19 1004  BP: 129/73  Pulse: 74  Weight: 139 lb (63 kg)  Height: 5\' 2"  (1.575 m)   Body mass index is 25.42 kg/m.  Generalized: Well developed, in no acute distress  MMSE - Mini Mental State Exam 12/01/2019 06/02/2019 11/25/2018  Not completed: - (No Data) -  Orientation to time 0 0 3  Orientation to Place 4 3 4   Registration 3 3 3   Attention/ Calculation 0 0 5  Recall 0 0 2  Language- name 2 objects 2 2 2   Language- repeat 0 1 1  Language- follow 3 step command 3 3 3   Language- read & follow direction 1 1 1   Write a sentence 1 1 1   Copy design 1 1 1   Copy design-comments 7 animals - -  Total score 15 15 26     Neurological examination  Mentation: Alert, most history is provided by her family, able to identify her son and husband, pleasant, cooperative. Follows all commands speech and language fluent Cranial nerve II-XII: Pupils were equal round reactive to light.  Extraocular movements were full, visual field were full on confrontational test. Facial sensation and strength were normal. Head turning and shoulder shrug  were normal and symmetric. Motor: The motor testing reveals 5 over 5 strength of all 4 extremities. Good symmetric motor tone is noted throughout.  Sensory: Sensory testing is intact to soft touch on all 4 extremities. No evidence of extinction is noted.  Coordination: Cerebellar testing reveals good finger-nose-finger and heel-to-shin bilaterally.  Gait and station: Gait is slow, cautious, no assistive device, slightly wide-based. Reflexes: Deep tendon reflexes are symmetric but decreased throughout  DIAGNOSTIC DATA (LABS, IMAGING, TESTING) - I reviewed patient records, labs, notes, testing and imaging myself where available.  Lab Results  Component Value Date   WBC 9.7 08/13/2019   HGB 12.2 08/13/2019   HCT 36.9 08/13/2019   MCV 88.7 08/13/2019   PLT 380.0 08/13/2019      Component Value Date/Time   NA 138 08/13/2019 1046   K 4.0 08/13/2019 1046   CL 100 08/13/2019 1046   CO2 30 08/13/2019 1046   GLUCOSE 123 (H) 08/13/2019 1046   BUN 27 (H) 08/13/2019 1046   CREATININE 0.97 08/13/2019 1046   CALCIUM 10.1 08/13/2019 1046   PROT 7.8 08/13/2019 1046   ALBUMIN 4.2 08/13/2019 1046   AST 30 08/13/2019 1046   ALT 42 (H) 08/13/2019 1046   ALKPHOS 132 (H) 08/13/2019 1046   BILITOT 1.0 08/13/2019 1046   GFRNONAA 59 (L) 12/24/2014 0450   GFRAA >60 12/24/2014 0450   Lab Results  Component Value Date   CHOL 132 08/13/2019   HDL 35.40 (L) 08/13/2019   LDLCALC 65 08/13/2019   LDLDIRECT 80.0 09/26/2017   TRIG 155.0 (H) 08/13/2019   CHOLHDL 4 08/13/2019   Lab Results  Component Value Date   HGBA1C 6.7 (A) 08/13/2019   Lab Results  Component Value Date   VITAMINB12 602 10/30/2017   Lab Results  Component Value Date   TSH 2.96 08/13/2019      ASSESSMENT AND PLAN 79 y.o. year old female  has a past medical history of  Contact dermatitis and other eczema, due to unspecified cause, Displacement of lumbar intervertebral disc without myelopathy, FH: cataracts, Gout, unspecified, Memory loss, Occlusion and stenosis of carotid artery without mention of cerebral infarction, Other and unspecified hyperlipidemia, Pain in joint, shoulder region, Pain in limb, Routine general medical examination at a health care facility, Sciatica, Type II or unspecified type diabetes mellitus without mention of complication, uncontrolled, Unspecified essential hypertension, and Unspecified venous (peripheral) insufficiency. here with:  1.  Dementia  Memory score stable 15/30.  She will remain on Aricept and Namenda, but will try taking Aricept in the morning as she may possibly be experiencing side effect of bad dreams.  If the sleeping continues to be an issue, may consider trazodone, maybe not mirtazapine due to wanting to frequently eat. Encouraged brain stimulating exercises and activity as tolerated. She will follow-up in 6 months or sooner if needed.  I spent 30 minutes of face-to-face and non-face-to-face time with patient.  This included previsit chart review, lab review, study review, order entry, electronic health record documentation, patient education.  Butler Denmark, AGNP-C, DNP 12/01/2019, 10:18 AM [facility] [address] [place],  [address] [phone]

## 2019-12-02 NOTE — Progress Notes (Signed)
I have read the note, and I agree with the clinical assessment and plan.  Chikita Dogan K Shyanna Klingel   

## 2019-12-09 ENCOUNTER — Telehealth: Payer: Self-pay | Admitting: Neurology

## 2019-12-09 MED ORDER — CITALOPRAM HYDROBROMIDE 10 MG PO TABS: 10.0000 mg | ORAL_TABLET | Freq: Every day | ORAL | 3 refills | Status: DC

## 2019-12-09 NOTE — Addendum Note (Signed)
Addended by: Suzzanne Cloud on: 12/09/2019 05:14 PM   Modules accepted: Orders
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
## 2020-01-16 ENCOUNTER — Other Ambulatory Visit: Payer: Self-pay | Admitting: Internal Medicine

## 2020-01-16 DIAGNOSIS — E1140 Type 2 diabetes mellitus with diabetic neuropathy, unspecified: Secondary | ICD-10-CM

## 2020-02-06 ENCOUNTER — Telehealth: Payer: Self-pay | Admitting: Neurology

## 2020-02-06 NOTE — Telephone Encounter (Signed)
daughter asking for an increase on pt's anti depressant medication.  The current dose is not helping like it once did, please call.

## 2020-02-09 MED ORDER — CITALOPRAM HYDROBROMIDE 10 MG PO TABS: 15.0000 mg | ORAL_TABLET | Freq: Every day | ORAL | 5 refills | Status: DC

## 2020-02-09 NOTE — Addendum Note (Signed)
Addended by: Suzzanne Cloud on: 02/09/2020 12:33 PM   Modules accepted: Orders

## 2020-02-09 NOTE — Telephone Encounter (Signed)
Spoke to daughter and she states her mother over the last 3 wks has noted more agitation,/anger and is asking if the celexa could be increased from 10mg  po daily.  She does not feel like she has an infection / UTI as feels more progressive vs acute.  Please advise.  Next appt in January 2022 (6 month RV).

## 2020-02-09 NOTE — Telephone Encounter (Signed)
Spoke to daughter and relayed that can increase the celexa to 15mg  po daily.  Let us know how she does. She verbalized understanding.  (take 1.5 tabs daily).

## 2020-02-09 NOTE — Telephone Encounter (Signed)
She can try Celexa 15 mg daily if they feel the Celexa itself has been helpful.

## 2020-02-27 ENCOUNTER — Other Ambulatory Visit: Payer: Self-pay | Admitting: Internal Medicine

## 2020-02-27 DIAGNOSIS — H6981 Other specified disorders of Eustachian tube, right ear: Secondary | ICD-10-CM

## 2020-02-27 DIAGNOSIS — I10 Essential (primary) hypertension: Secondary | ICD-10-CM

## 2020-03-19 ENCOUNTER — Other Ambulatory Visit: Payer: Self-pay | Admitting: Family

## 2020-03-19 ENCOUNTER — Encounter: Payer: Self-pay | Admitting: Family

## 2020-03-19 ENCOUNTER — Ambulatory Visit (INDEPENDENT_AMBULATORY_CARE_PROVIDER_SITE_OTHER): Payer: Medicare Other | Admitting: Family

## 2020-03-19 ENCOUNTER — Other Ambulatory Visit: Payer: Self-pay

## 2020-03-19 VITALS — BP 130/68 | HR 58 | Temp 98.6°F | Ht 62.0 in | Wt 139.0 lb

## 2020-03-19 DIAGNOSIS — D649 Anemia, unspecified: Secondary | ICD-10-CM

## 2020-03-19 DIAGNOSIS — L309 Dermatitis, unspecified: Secondary | ICD-10-CM

## 2020-03-19 DIAGNOSIS — L989 Disorder of the skin and subcutaneous tissue, unspecified: Secondary | ICD-10-CM | POA: Diagnosis not present

## 2020-03-19 DIAGNOSIS — E1140 Type 2 diabetes mellitus with diabetic neuropathy, unspecified: Secondary | ICD-10-CM | POA: Diagnosis not present

## 2020-03-19 LAB — COMPREHENSIVE METABOLIC PANEL
ALT: 37 U/L — ABNORMAL HIGH (ref 0–35)
AST: 38 U/L — ABNORMAL HIGH (ref 0–37)
Albumin: 4 g/dL (ref 3.5–5.2)
Alkaline Phosphatase: 125 U/L — ABNORMAL HIGH (ref 39–117)
BUN: 49 mg/dL — ABNORMAL HIGH (ref 6–23)
CO2: 28 mEq/L (ref 19–32)
Calcium: 9.3 mg/dL (ref 8.4–10.5)
Chloride: 103 mEq/L (ref 96–112)
Creatinine, Ser: 1.39 mg/dL — ABNORMAL HIGH (ref 0.40–1.20)
GFR: 35.87 mL/min — ABNORMAL LOW (ref >60.00)
Glucose, Bld: 92 mg/dL (ref 70–99)
Potassium: 4.2 mEq/L (ref 3.5–5.1)
Sodium: 138 mEq/L (ref 135–145)
Total Bilirubin: 0.9 mg/dL (ref 0.2–1.2)
Total Protein: 7.4 g/dL (ref 6.0–8.3)

## 2020-03-19 LAB — CBC WITH DIFFERENTIAL/PLATELET
Basophils Absolute: 0.1 10*3/uL (ref 0.0–0.1)
Basophils Relative: 0.5 % (ref 0.0–3.0)
Eosinophils Absolute: 1.3 10*3/uL — ABNORMAL HIGH (ref 0.0–0.7)
Eosinophils Relative: 13.6 % — ABNORMAL HIGH (ref 0.0–5.0)
HCT: 33.5 % — ABNORMAL LOW (ref 36.0–46.0)
Hemoglobin: 11.1 g/dL — ABNORMAL LOW (ref 12.0–15.0)
Lymphocytes Relative: 24.7 % (ref 12.0–46.0)
Lymphs Abs: 2.4 10*3/uL (ref 0.7–4.0)
MCHC: 33.1 g/dL (ref 30.0–36.0)
MCV: 88.6 fl (ref 78.0–100.0)
Monocytes Absolute: 1 10*3/uL (ref 0.1–1.0)
Monocytes Relative: 10.4 % (ref 3.0–12.0)
Neutro Abs: 4.9 10*3/uL (ref 1.4–7.7)
Neutrophils Relative %: 50.8 % (ref 43.0–77.0)
Platelets: 395 10*3/uL (ref 150.0–400.0)
RBC: 3.78 Mil/uL — ABNORMAL LOW (ref 3.87–5.11)
RDW: 14.9 % (ref 11.5–15.5)
WBC: 9.6 10*3/uL (ref 4.0–10.5)

## 2020-03-19 LAB — HEMOGLOBIN A1C: Hgb A1c MFr Bld: 6.8 % — ABNORMAL HIGH (ref 4.6–6.5)

## 2020-03-19 MED ORDER — PREDNISONE 20 MG PO TABS: 20.0000 mg | ORAL_TABLET | Freq: Every day | ORAL | 0 refills | Status: DC

## 2020-03-19 MED ORDER — SULFAMETHOXAZOLE-TRIMETHOPRIM 800-160 MG PO TABS: 1.0000 | ORAL_TABLET | Freq: Two times a day (BID) | ORAL | 0 refills | Status: DC

## 2020-03-19 NOTE — Progress Notes (Signed)
[name] is a 79 y.o. female with the following history as recorded in EpicCare:  Patient Active Problem List   Diagnosis Date Noted  . Chronic renal disease, stage 3, moderately decreased glomerular filtration rate (GFR) between 30-59 mL/min/1.73 square meter ([place]) 09/26/2018  . Senile dementia without behavioral disturbance ([place]) 03/21/2017  . Atherosclerosis of native artery of both lower extremities with intermittent claudication ([place]) 11/24/2015  . Cervical stenosis of spinal canal 12/22/2014  . Visit for screening mammogram 09/16/2014  . Routine health maintenance 01/03/2011  . Peripheral neuropathy ([place]) 08/23/2010  . DM (diabetes mellitus) type II controlled, neurological manifestation ([place]) 07/21/2010  . Carotid artery stenosis 12/15/2009  . DEGENERATIVE DISC DISEASE, LUMBOSACRAL SPINE W/RADICULOPATHY 12/29/2008  . Dyslipidemia, goal LDL below 70 02/16/2007  . Gout 02/16/2007  . Essential hypertension 02/16/2007    Current Outpatient Medications  Medication Sig Dispense Refill  . aspirin 81 MG EC tablet Take 1 tablet (81 mg total) by mouth daily. Swallow whole. 30 tablet 12  . citalopram (CELEXA) 10 MG tablet Take 1.5 tablets (15 mg total) by mouth daily. 45 tablet 5  . Colchicine 0.6 MG CAPS Take 1 capsule by mouth 2 (two) times a day. 60 capsule 1  . donepezil (ARICEPT) 10 MG tablet Take 1 tablet (10 mg total) by mouth at bedtime. 90 tablet 3  . hydrochlorothiazide (HYDRODIURIL) 25 MG tablet Take 1 tablet (25 mg total) by mouth daily. 90 tablet 1  . irbesartan (AVAPRO) 150 MG tablet TAKE 1 TABLET(150 MG) BY MOUTH DAILY 90 tablet 1  . levocetirizine (XYZAL) 5 MG tablet TAKE 1 TABLET(5 MG) BY MOUTH EVERY EVENING 90 tablet 1  . memantine (NAMENDA) 10 MG tablet Take 1 tablet (10 mg total) by mouth 2 (two) times daily. 60 tablet 11  . metFORMIN (GLUCOPHAGE) 500 MG tablet TAKE 1 TABLET(500 MG) BY MOUTH TWICE DAILY WITH A MEAL 180 tablet 1  . propranolol (INDERAL) 20 MG tablet  TAKE 1 TABLET(20 MG) BY MOUTH TWICE DAILY 180 tablet 0  . simvastatin (ZOCOR) 20 MG tablet TAKE 1 TABLET(20 MG) BY MOUTH DAILY AT 6 PM 90 tablet 1  . predniSONE (DELTASONE) 20 MG tablet Take 1 tablet (20 mg total) by mouth daily with breakfast. 5 tablet 0  . sulfamethoxazole-trimethoprim (BACTRIM DS) 800-160 MG tablet Take 1 tablet by mouth 2 (two) times daily. 10 tablet 0   No current facility-administered medications for this visit.    Allergies: Pneumococcal vaccines, Ampicillin, Neomycin-bacitracin zn-polymyx, and Penicillins  Past Medical History:  Diagnosis Date  . Contact dermatitis and other eczema, due to unspecified cause   . Displacement of lumbar intervertebral disc without myelopathy   . FH: cataracts   . Gout, unspecified   . Memory loss   . Occlusion and stenosis of carotid artery without mention of cerebral infarction   . Other and unspecified hyperlipidemia   . Pain in joint, shoulder region   . Pain in limb   . Routine general medical examination at a health care facility   . Sciatica   . Type II or unspecified type diabetes mellitus without mention of complication, uncontrolled   . Unspecified essential hypertension   . Unspecified venous (peripheral) insufficiency     Past Surgical History:  Procedure Laterality Date  . ANTERIOR CERVICAL DECOMP/DISCECTOMY FUSION N/A 12/22/2014   Procedure: Anterior Cervical Four-Five/Five-Six/Six-Seven Decompression/Diskectomy/ and Fsion;  Surgeon: Leeroy Cha, MD;  Location: [facility];  Service: Neurosurgery;  Laterality: N/A;  C4-5 C5-6 C6-7 Anterior cervical  decompression/diskectomy/fusion  . LAPAROSCOPIC APPENDECTOMY    . LUMBAR LAMINECTOMY  07/2008   [place]  . TUBAL LIGATION      Family History  Problem Relation Age of Onset  . Alzheimer's disease Mother   . Diabetes Mother   . Coronary artery disease Father        CABG  . Hypertension Father   . Cancer Brother   . Cancer Brother   . Colon cancer Neg Hx   .  Breast cancer Neg Hx     Social History   Tobacco Use  . Smoking status: Former Smoker    Quit date: 05/23/1995    Years since quitting: 24.8  . Smokeless tobacco: Never Used  Substance Use Topics  . Alcohol use: No    Subjective:  Accompanied by daughter; bilateral rash on both of her legs- difficult historian but daughter notes that patient has mentioned itching; denies any new soaps, foods, detergents or medications.   Daughter is requesting to get labs checked for diabetes; Also concerned about changing mole on patient's left cheek;   Objective:  Vitals:   03/19/20 0932  BP: 130/68  Pulse: (!) 58  Temp: 98.6 F (37 C)  TempSrc: Oral  SpO2: 99%  Weight: 139 lb (63 kg)  Height: '5\' 2"'  (1.575 m)    General: Well developed, well nourished, in no acute distress  Skin : Warm and dry. Erythema noted bilaterally; papular lesions with secondary infection;  Head: Normocephalic and atraumatic  Lungs: Respirations unlabored; clear to auscultation bilaterally without wheeze, rales, rhonchi  Neurologic: Alert and oriented; speech intact; face symmetrical; moves all extremities well; CNII-XII intact without focal deficit   Assessment:  1. Controlled type 2 diabetes mellitus with diabetic neuropathy, without long-term current use of insulin ([place])   2. Skin lesion of face   3. Dermatitis     Plan:  1. Update labs as requested; keep planned follow up for AWV next week; 2. Refer to dermatology; 3. Rx for Prednisone and Bactrim; follow-up worse, no better;  This visit occurred during the SARS-CoV-2 public health emergency.  Safety protocols were in place, including screening questions prior to the visit, additional usage of staff PPE, and extensive cleaning of exam room while observing appropriate contact time as indicated for disinfecting solutions.      No follow-ups on file.  Orders Placed This Encounter  Procedures  . CBC with Differential/Platelet    Standing Status:   Future     Number of Occurrences:   1    Standing Expiration Date:   03/19/2021  . Comp Met (CMET)    Standing Status:   Future    Number of Occurrences:   1    Standing Expiration Date:   03/19/2021  . Hemoglobin A1c    Standing Status:   Future    Number of Occurrences:   1    Standing Expiration Date:   03/19/2021  . Ambulatory referral to Dermatology    Referral Priority:   Routine    Referral Type:   Consultation    Referral Reason:   Specialty Services Required    Requested Specialty:   Dermatology    Number of Visits Requested:   1    Requested Prescriptions   Signed Prescriptions Disp Refills  . predniSONE (DELTASONE) 20 MG tablet 5 tablet 0    Sig: Take 1 tablet (20 mg total) by mouth daily with breakfast.  . sulfamethoxazole-trimethoprim (BACTRIM DS) 800-160 MG tablet 10 tablet 0  Sig: Take 1 tablet by mouth 2 (two) times daily.

## 2020-03-25 ENCOUNTER — Ambulatory Visit (INDEPENDENT_AMBULATORY_CARE_PROVIDER_SITE_OTHER): Payer: Medicare Other

## 2020-03-25 ENCOUNTER — Other Ambulatory Visit: Payer: Self-pay

## 2020-03-25 VITALS — BP 140/70 | HR 55 | Temp 98.0°F | Resp 16 | Ht 62.0 in | Wt 136.0 lb

## 2020-03-25 DIAGNOSIS — Z23 Encounter for immunization: Secondary | ICD-10-CM | POA: Diagnosis not present

## 2020-03-25 DIAGNOSIS — Z0000 Encounter for general adult medical examination without abnormal findings: Secondary | ICD-10-CM | POA: Diagnosis not present

## 2020-03-25 NOTE — Patient Instructions (Signed)
[name] , Thank you for taking time to come for your Medicare Wellness Visit. I appreciate your ongoing commitment to your health goals. Please review the following plan we discussed and let me know if I can assist you in the future.   Screening recommendations/referrals: Colonoscopy: no repeat due to age 79: last done 12/26/2013 Bone Density: last done 01/13/2013 Recommended yearly ophthalmology/optometry visit for glaucoma screening and checkup Recommended yearly dental visit for hygiene and checkup  Vaccinations: Influenza vaccine: 08/13/2019 Pneumococcal vaccine: up to date Tdap vaccine: 09/26/2017; due every 10 years (2029) Shingles vaccine: never done Covid-19: declined  Advanced directives: Please bring a copy of your health care power of attorney and living will to the office at your convenience.  Conditions/risks identified: Yes; Reviewed health maintenance screenings with patient today and relevant education, vaccines, and/or referrals were provided. Please continue to do your personal lifestyle choices by: daily care of teeth and gums, regular physical activity (goal should be 5 days a week for 30 minutes), eat a healthy diet, avoid tobacco and drug use, limiting any alcohol intake, taking a low-dose aspirin (if not allergic or have been advised by your provider otherwise) and taking vitamins and minerals as recommended by your provider. Continue doing brain stimulating activities (puzzles, reading, adult coloring books, staying active) to keep memory sharp. Continue to eat heart healthy diet (full of fruits, vegetables, whole grains, lean protein, water--limit salt, fat, and sugar intake) and increase physical activity as tolerated.  Next appointment: Please schedule your next Medicare Wellness Visit with your Nurse Health Advisor in 1 year by calling [phone].   Preventive Care 38 Years and Older, Female Preventive care refers to lifestyle choices and visits with your  health care provider that can promote health and wellness. What does preventive care include?  A yearly physical exam. This is also called an annual well check.  Dental exams once or twice a year.  Routine eye exams. Ask your health care provider how often you should have your eyes checked.  Personal lifestyle choices, including:  Daily care of your teeth and gums.  Regular physical activity.  Eating a healthy diet.  Avoiding tobacco and drug use.  Limiting alcohol use.  Practicing safe sex.  Taking low-dose aspirin every day.  Taking vitamin and mineral supplements as recommended by your health care provider. What happens during an annual well check? The services and screenings done by your health care provider during your annual well check will depend on your age, overall health, lifestyle risk factors, and family history of disease. Counseling  Your health care provider may ask you questions about your:  Alcohol use.  Tobacco use.  Drug use.  Emotional well-being.  Home and relationship well-being.  Sexual activity.  Eating habits.  History of falls.  Memory and ability to understand (cognition).  Work and work Statistician.  Reproductive health. Screening  You may have the following tests or measurements:  Height, weight, and BMI.  Blood pressure.  Lipid and cholesterol levels. These may be checked every 5 years, or more frequently if you are over 46 years old.  Skin check.  Lung cancer screening. You may have this screening every year starting at age 79 if you have a 30-pack-year history of smoking and currently smoke or have quit within the past 15 years.  Fecal occult blood test (FOBT) of the stool. You may have this test every year starting at age 79.  Flexible sigmoidoscopy or colonoscopy. You may have a sigmoidoscopy every  5 years or a colonoscopy every 10 years starting at age 11.  Hepatitis C blood test.  Hepatitis B blood  test.  Sexually transmitted disease (STD) testing.  Diabetes screening. This is done by checking your blood sugar (glucose) after you have not eaten for a while (fasting). You may have this done every 1-3 years.  Bone density scan. This is done to screen for osteoporosis. You may have this done starting at age 78.  Mammogram. This may be done every 1-2 years. Talk to your health care provider about how often you should have regular mammograms. Talk with your health care provider about your test results, treatment options, and if necessary, the need for more tests. Vaccines  Your health care provider may recommend certain vaccines, such as:  Influenza vaccine. This is recommended every year.  Tetanus, diphtheria, and acellular pertussis (Tdap, Td) vaccine. You may need a Td booster every 10 years.  Zoster vaccine. You may need this after age 66.  Pneumococcal 13-valent conjugate (PCV13) vaccine. One dose is recommended after age 62.  Pneumococcal polysaccharide (PPSV23) vaccine. One dose is recommended after age 41. Talk to your health care provider about which screenings and vaccines you need and how often you need them. This information is not intended to replace advice given to you by your health care provider. Make sure you discuss any questions you have with your health care provider. Document Released: 06/04/2015 Document Revised: 01/26/2016 Document Reviewed: 03/09/2015 Elsevier Interactive Patient Education  2017 [place] Prevention in the Home Falls can cause injuries. They can happen to people of all ages. There are many things you can do to make your home safe and to help prevent falls. What can I do on the outside of my home?  Regularly fix the edges of walkways and driveways and fix any cracks.  Remove anything that might make you trip as you walk through a door, such as a raised step or threshold.  Trim any bushes or trees on the path to your home.  Use  bright outdoor lighting.  Clear any walking paths of anything that might make someone trip, such as rocks or tools.  Regularly check to see if handrails are loose or broken. Make sure that both sides of any steps have handrails.  Any raised decks and porches should have guardrails on the edges.  Have any leaves, snow, or ice cleared regularly.  Use sand or salt on walking paths during winter.  Clean up any spills in your garage right away. This includes oil or grease spills. What can I do in the bathroom?  Use night lights.  Install grab bars by the toilet and in the tub and shower. Do not use towel bars as grab bars.  Use non-skid mats or decals in the tub or shower.  If you need to sit down in the shower, use a plastic, non-slip stool.  Keep the floor dry. Clean up any water that spills on the floor as soon as it happens.  Remove soap buildup in the tub or shower regularly.  Attach bath mats securely with double-sided non-slip rug tape.  Do not have throw rugs and other things on the floor that can make you trip. What can I do in the bedroom?  Use night lights.  Make sure that you have a light by your bed that is easy to reach.  Do not use any sheets or blankets that are too big for your bed. They should not  hang down onto the floor.  Have a firm chair that has side arms. You can use this for support while you get dressed.  Do not have throw rugs and other things on the floor that can make you trip. What can I do in the kitchen?  Clean up any spills right away.  Avoid walking on wet floors.  Keep items that you use a lot in easy-to-reach places.  If you need to reach something above you, use a strong step stool that has a grab bar.  Keep electrical cords out of the way.  Do not use floor polish or wax that makes floors slippery. If you must use wax, use non-skid floor wax.  Do not have throw rugs and other things on the floor that can make you trip. What can  I do with my stairs?  Do not leave any items on the stairs.  Make sure that there are handrails on both sides of the stairs and use them. Fix handrails that are broken or loose. Make sure that handrails are as long as the stairways.  Check any carpeting to make sure that it is firmly attached to the stairs. Fix any carpet that is loose or worn.  Avoid having throw rugs at the top or bottom of the stairs. If you do have throw rugs, attach them to the floor with carpet tape.  Make sure that you have a light switch at the top of the stairs and the bottom of the stairs. If you do not have them, ask someone to add them for you. What else can I do to help prevent falls?  Wear shoes that:  Do not have high heels.  Have rubber bottoms.  Are comfortable and fit you well.  Are closed at the toe. Do not wear sandals.  If you use a stepladder:  Make sure that it is fully opened. Do not climb a closed stepladder.  Make sure that both sides of the stepladder are locked into place.  Ask someone to hold it for you, if possible.  Clearly mark and make sure that you can see:  Any grab bars or handrails.  First and last steps.  Where the edge of each step is.  Use tools that help you move around (mobility aids) if they are needed. These include:  Canes.  Walkers.  Scooters.  Crutches.  Turn on the lights when you go into a dark area. Replace any light bulbs as soon as they burn out.  Set up your furniture so you have a clear path. Avoid moving your furniture around.  If any of your floors are uneven, fix them.  If there are any pets around you, be aware of where they are.  Review your medicines with your doctor. Some medicines can make you feel dizzy. This can increase your chance of falling. Ask your doctor what other things that you can do to help prevent falls. This information is not intended to replace advice given to you by your health care provider. Make sure you  discuss any questions you have with your health care provider. Document Released: 03/04/2009 Document Revised: 10/14/2015 Document Reviewed: 06/12/2014 Elsevier Interactive Patient Education  2017 [employer].

## 2020-03-25 NOTE — Progress Notes (Signed)
Subjective:   [name] is a 79 y.o. female who presents for Medicare Annual (Subsequent) preventive examination.  Review of Systems    No ROS. Medicare Wellness Visit. Additional risk factors are reflected in social history. Cardiac Risk Factors include: advanced age (>18men, >81 women);dyslipidemia;family history of premature cardiovascular disease;hypertension     Objective:    Today's Vitals   03/25/20 1532  BP: 140/70  Pulse: (!) 55  Resp: 16  Temp: 98 F (36.7 C)  SpO2: 99%  Weight: 136 lb (61.7 kg)  Height: 5\' 2"  (1.575 m)  PainSc: 0-No pain   Body mass index is 24.87 kg/m.  Advanced Directives 03/25/2020 10/17/2016 12/22/2014 12/18/2014  Does Patient Have a Medical Advance Directive? Yes Yes No No  Type of Advance Directive Living will [place];Living will - -  Does patient want to make changes to medical advance directive? No - Patient declined - - -  Copy of [place] in Chart? - No - copy requested - -  Would patient like information on creating a medical advance directive? - - No - patient declined information No - patient declined information    Current Medications (verified) Outpatient Encounter Medications as of 03/25/2020  Medication Sig  . aspirin 81 MG EC tablet Take 1 tablet (81 mg total) by mouth daily. Swallow whole.  . citalopram (CELEXA) 10 MG tablet Take 1.5 tablets (15 mg total) by mouth daily.  Marland Kitchen donepezil (ARICEPT) 10 MG tablet Take 1 tablet (10 mg total) by mouth at bedtime.  . hydrochlorothiazide (HYDRODIURIL) 25 MG tablet Take 1 tablet (25 mg total) by mouth daily.  . irbesartan (AVAPRO) 150 MG tablet TAKE 1 TABLET(150 MG) BY MOUTH DAILY  . levocetirizine (XYZAL) 5 MG tablet TAKE 1 TABLET(5 MG) BY MOUTH EVERY EVENING  . memantine (NAMENDA) 10 MG tablet Take 1 tablet (10 mg total) by mouth 2 (two) times daily.  . metFORMIN (GLUCOPHAGE) 500 MG tablet TAKE 1 TABLET(500 MG) BY MOUTH TWICE DAILY WITH A  MEAL  . propranolol (INDERAL) 20 MG tablet TAKE 1 TABLET(20 MG) BY MOUTH TWICE DAILY  . simvastatin (ZOCOR) 20 MG tablet TAKE 1 TABLET(20 MG) BY MOUTH DAILY AT 6 PM  . Colchicine 0.6 MG CAPS Take 1 capsule by mouth 2 (two) times a day. (Patient not taking: Reported on 03/25/2020)  . [DISCONTINUED] irbesartan (AVAPRO) 150 MG tablet TAKE 1 TABLET(150 MG) BY MOUTH DAILY  . [DISCONTINUED] levocetirizine (XYZAL) 5 MG tablet TAKE 1 TABLET(5 MG) BY MOUTH EVERY EVENING  . [DISCONTINUED] levocetirizine (XYZAL) 5 MG tablet TAKE 1 TABLET(5 MG) BY MOUTH EVERY EVENING  . [DISCONTINUED] predniSONE (DELTASONE) 20 MG tablet Take 1 tablet (20 mg total) by mouth daily with breakfast.  . [DISCONTINUED] sulfamethoxazole-trimethoprim (BACTRIM DS) 800-160 MG tablet Take 1 tablet by mouth 2 (two) times daily.   No facility-administered encounter medications on file as of 03/25/2020.    Allergies (verified) Pneumococcal vaccines, Ampicillin, Neomycin-bacitracin zn-polymyx, and Penicillins   History: Past Medical History:  Diagnosis Date  . Contact dermatitis and other eczema, due to unspecified cause   . Displacement of lumbar intervertebral disc without myelopathy   . FH: cataracts   . Gout, unspecified   . Memory loss   . Occlusion and stenosis of carotid artery without mention of cerebral infarction   . Other and unspecified hyperlipidemia   . Pain in joint, shoulder region   . Pain in limb   . Routine general medical examination at  a health care facility   . Sciatica   . Type II or unspecified type diabetes mellitus without mention of complication, uncontrolled   . Unspecified essential hypertension   . Unspecified venous (peripheral) insufficiency    Past Surgical History:  Procedure Laterality Date  . ANTERIOR CERVICAL DECOMP/DISCECTOMY FUSION N/A 12/22/2014   Procedure: Anterior Cervical Four-Five/Five-Six/Six-Seven Decompression/Diskectomy/ and Fsion;  Surgeon: Leeroy Cha, MD;  Location: [facility];  Service: Neurosurgery;  Laterality: N/A;  C4-5 C5-6 C6-7 Anterior cervical decompression/diskectomy/fusion  . LAPAROSCOPIC APPENDECTOMY    . LUMBAR LAMINECTOMY  07/2008   [place]  . TUBAL LIGATION     Family History  Problem Relation Age of Onset  . Alzheimer's disease Mother   . Diabetes Mother   . Coronary artery disease Father        CABG  . Hypertension Father   . Cancer Brother   . Cancer Brother   . Colon cancer Neg Hx   . Breast cancer Neg Hx    Social History   Socioeconomic History  . Marital status: Married    Spouse name: Laverna Peace  . Number of children: 3  . Years of education: 63  . Highest education level: Not on file  Occupational History  . Not on file  Tobacco Use  . Smoking status: Former Smoker    Quit date: 05/23/1995    Years since quitting: 24.8  . Smokeless tobacco: Never Used  Substance and Sexual Activity  . Alcohol use: No  . Drug use: No  . Sexual activity: Yes    Partners: Male  Other Topics Concern  . Not on file  Social History Narrative   HSG.  Married '59.  2 sons - '65, '62; 1 dtr '61; 7 grandchildren; 3 great-grandchildren.  Lives w/husband in own home.  END OF LIFE: she does not want to be kept alive on machines. Needs more information in regard to CPR, etc. Provided w/L[employer]'s guide and MOST (7/09)   Social Determinants of Health   Financial Resource Strain: Low Risk   . Difficulty of Paying Living Expenses: Not hard at all  Food Insecurity: No Food Insecurity  . Worried About Charity fundraiser in the Last Year: Never true  . Ran Out of Food in the Last Year: Never true  Transportation Needs: No Transportation Needs  . Lack of Transportation (Medical): No  . Lack of Transportation (Non-Medical): No  Physical Activity: Sufficiently Active  . Days of Exercise per Week: 5 days  . Minutes of Exercise per Session: 30 min  Stress: No Stress Concern Present  . Feeling of Stress : Not at all  Social Connections: Socially  Integrated  . Frequency of Communication with Friends and Family: More than three times a week  . Frequency of Social Gatherings with Friends and Family: More than three times a week  . Attends Religious Services: More than 4 times per year  . Active Member of Clubs or Organizations: Yes  . Attends Archivist Meetings: More than 4 times per year  . Marital Status: Married    Tobacco Counseling Counseling given: Not Answered   Clinical Intake:  Pre-visit preparation completed: Yes  Pain : No/denies pain Pain Score: 0-No pain     BMI - recorded: 24.87 Nutritional Status: BMI of 19-24  Normal Nutritional Risks: None Diabetes: No  How often do you need to have someone help you when you read instructions, pamphlets, or other written materials from your doctor or pharmacy?:  1 - Never What is the last grade level you completed in school?: HSG  Diabetic? yes  Interpreter Needed?: No  Information entered by :: Lisette Abu, LPN   Activities of Daily Living In your present state of health, do you have any difficulty performing the following activities: 03/25/2020  Hearing? N  Vision? N  Difficulty concentrating or making decisions? Y  Comment Takes Aricept and Namenda  Walking or climbing stairs? N  Dressing or bathing? N  Doing errands, shopping? Y  Comment Daughter brings her to appts  Preparing Food and eating ? N  Using the Toilet? N  In the past six months, have you accidently leaked urine? N  Do you have problems with loss of bowel control? N  Managing your Medications? N  Managing your Finances? N  Housekeeping or managing your Housekeeping? N  Some recent data might be hidden    Patient Care Team: Janith Lima, MD as PCP - General (Internal Medicine) Leeroy Cha, MD (Neurosurgery) Newton Pigg, MD (Obstetrics and Gynecology) Druscilla Brownie, MD (Dermatology) Marica Otter, OD (Optometry)  Indicate any recent Medical Services you  may have received from other than [facility] providers in the past year (date may be approximate).     Assessment:   This is a routine wellness examination for Taisha.  Hearing/Vision screen No exam data present  Dietary issues and exercise activities discussed: Current Exercise Habits: Home exercise routine, Type of exercise: walking, Time (Minutes): 30, Frequency (Times/Week): 5, Weekly Exercise (Minutes/Week): 150, Intensity: Mild  Goals    . continue to exercise at the [facility]      I will try to go 3-5 times weekly.      Depression Screen PHQ 2/9 Scores 03/25/2020 08/13/2019 12/11/2017 10/17/2016 04/21/2015 03/31/2014 11/26/2013  PHQ - 2 Score 0 0 0 0 0 0 0    Fall Risk Fall Risk  03/25/2020 08/13/2019 12/11/2017 06/05/2017 11/27/2016  Falls in the past year? 1 0 No No Yes  Comment - - - - -  Number falls in past yr: 0 0 - - 1  Comment - - - - -  Injury with Fall? 0 0 - - No  Risk for fall due to : Impaired balance/gait Impaired balance/gait;Impaired mobility - - Other (Comment)  Risk for fall due to: Comment - - - - "feet got tangled up"  Follow up Falls evaluation completed;Education provided Falls evaluation completed - - -    Any stairs in or around the home? No  If so, are there any without handrails? Yes  Home free of loose throw rugs in walkways, pet beds, electrical cords, etc? No  Adequate lighting in your home to reduce risk of falls? No   ASSISTIVE DEVICES UTILIZED TO PREVENT FALLS:  Life alert? No  Use of a cane, walker or w/c? No  Grab bars in the bathroom? No  Shower chair or bench in shower? No  Elevated toilet seat or a handicapped toilet? No   TIMED UP AND GO:  Was the test performed? No .  Length of time to ambulate 10 feet: 0 sec.   Gait steady and fast without use of assistive device  Cognitive Function: MMSE - Mini Mental State Exam 03/25/2020 12/01/2019 06/02/2019 11/25/2018 06/06/2018  Not completed: - - (No Data) - (No Data)  Orientation to time 4 0 0  3 3  Orientation to Place 5 4 3 4 3   Registration 0 3 3 3 3   Attention/ Calculation 5 0 0  5 5  Recall 3 0 0 2 0  Language- name 2 objects 2 2 2 2 2   Language- repeat 1 0 1 1 1   Language- follow 3 step command 3 3 3 3 3   Language- read & follow direction 1 1 1 1 1   Write a sentence 1 1 1 1 1   Copy design 1 1 1 1 1   Copy design-comments - 7 animals - - -  Total score 26 15 15 26 23         Immunizations Immunization History  Administered Date(s) Administered  . Fluad Quad(high Dose 65+) 08/13/2019  . Influenza, High Dose Seasonal PF 03/11/2013, 04/21/2015, 04/18/2016, 03/12/2017, 02/20/2018  . Influenza,inj,Quad PF,6+ Mos 03/31/2014  . Pneumococcal Conjugate-13 07/22/2013  . Pneumococcal Polysaccharide-23 08/13/2019  . Td 08/14/2007  . Tdap 09/26/2017    TDAP status: Up to date Flu Vaccine status: Up to date Pneumococcal vaccine status: Up to date Covid-19 vaccine status: Declined, Education has been provided regarding the importance of this vaccine but patient still declined. Advised may receive this vaccine at local pharmacy or Health Dept.or vaccine clinic. Aware to provide a copy of the vaccination record if obtained from local pharmacy or Health Dept. Verbalized acceptance and understanding.  Qualifies for Shingles Vaccine? Yes   Zostavax completed No   Shingrix Completed?: No.    Education has been provided regarding the importance of this vaccine. Patient has been advised to call insurance company to determine out of pocket expense if they have not yet received this vaccine. Advised may also receive vaccine at local pharmacy or Health Dept. Verbalized acceptance and understanding.  Screening Tests Health Maintenance  Topic Date Due  . COVID-19 Vaccine (1) Never done  . FOOT EXAM  09/26/2019  . INFLUENZA VACCINE  12/21/2019  . OPHTHALMOLOGY EXAM  01/15/2020  . TETANUS/TDAP  09/27/2027  . DEXA SCAN  Completed  . PNA vac Low Risk Adult  Discontinued    Health  Maintenance  Health Maintenance Due  Topic Date Due  . COVID-19 Vaccine (1) Never done  . FOOT EXAM  09/26/2019  . INFLUENZA VACCINE  12/21/2019  . OPHTHALMOLOGY EXAM  01/15/2020    Colorectal cancer screening: No longer required.  Mammogram status: No longer required.  Bone Density status: Completed 01/13/2013. Results reflect: Bone density results: OSTEOPENIA. Repeat every 2 years.  Lung Cancer Screening: (Low Dose CT Chest recommended if Age 23-80 years, 30 pack-year currently smoking OR have quit w/in 15years.) does not qualify.   Lung Cancer Screening Referral: no  Additional Screening:  Hepatitis C Screening: does not qualify; Completed no  Vision Screening: Recommended annual ophthalmology exams for early detection of glaucoma and other disorders of the eye. Is the patient up to date with their annual eye exam?  Yes  Who is the provider or what is the name of the office in which the patient attends annual eye exams? Marica Otter, OD If pt is not established with a provider, would they like to be referred to a provider to establish care? No .   Dental Screening: Recommended annual dental exams for proper oral hygiene  Community Resource Referral / Chronic Care Management: CRR required this visit?  No   CCM required this visit?  No      Plan:     I have personally reviewed and noted the following in the patient's chart:   . Medical and social history . Use of alcohol, tobacco or illicit drugs  . Current medications and supplements .  Functional ability and status . Nutritional status . Physical activity . Advanced directives . List of other physicians . Hospitalizations, surgeries, and ER visits in previous 12 months . Vitals . Screenings to include cognitive, depression, and falls . Referrals and appointments  In addition, I have reviewed and discussed with patient certain preventive protocols, quality metrics, and best practice recommendations. A written  personalized care plan for preventive services as well as general preventive health recommendations were provided to patient.     Sheral Flow, LPN   [id number]   Nurse Notes: n/a

## 2020-04-04 ENCOUNTER — Other Ambulatory Visit: Payer: Self-pay | Admitting: Internal Medicine

## 2020-04-04 DIAGNOSIS — E1140 Type 2 diabetes mellitus with diabetic neuropathy, unspecified: Secondary | ICD-10-CM

## 2020-04-08 DIAGNOSIS — L821 Other seborrheic keratosis: Secondary | ICD-10-CM | POA: Diagnosis not present

## 2020-04-08 DIAGNOSIS — I872 Venous insufficiency (chronic) (peripheral): Secondary | ICD-10-CM | POA: Diagnosis not present

## 2020-04-08 DIAGNOSIS — I8311 Varicose veins of right lower extremity with inflammation: Secondary | ICD-10-CM | POA: Diagnosis not present

## 2020-04-08 DIAGNOSIS — L570 Actinic keratosis: Secondary | ICD-10-CM | POA: Diagnosis not present

## 2020-04-08 DIAGNOSIS — I8312 Varicose veins of left lower extremity with inflammation: Secondary | ICD-10-CM | POA: Diagnosis not present

## 2020-04-12 ENCOUNTER — Other Ambulatory Visit: Payer: Self-pay | Admitting: Internal Medicine

## 2020-04-12 DIAGNOSIS — E1140 Type 2 diabetes mellitus with diabetic neuropathy, unspecified: Secondary | ICD-10-CM

## 2020-04-12 DIAGNOSIS — I10 Essential (primary) hypertension: Secondary | ICD-10-CM

## 2020-04-23 ENCOUNTER — Telehealth: Payer: Self-pay | Admitting: Internal Medicine

## 2020-04-23 NOTE — Progress Notes (Signed)
  Chronic Care Management   Note  04/23/2020 Name: LEXII WALSH MRN: [id number] DOB: May 16, 1941  JINX GILDEN is a 79 y.o. year old female who is a primary care patient of Janith Lima, MD. I reached out to Yolande Jolly by phone today in response to a referral sent by Ms. Evangelyne J Lochridge's PCP, Janith Lima, MD.   Ms. Daniel was given information about Chronic Care Management services today including:  1. CCM service includes personalized support from designated clinical staff supervised by her physician, including individualized plan of care and coordination with other care providers 2. 24/7 contact phone numbers for assistance for urgent and routine care needs. 3. Service will only be billed when office clinical staff spend 20 minutes or more in a month to coordinate care. 4. Only one practitioner may furnish and bill the service in a calendar month. 5. The patient may stop CCM services at any time (effective at the end of the month) by phone call to the office staff.   Patient wishes to consider information provided and/or speak with a member of the care team before deciding about enrollment in care management services.   Follow up plan:   Carley Perdue [employer] Scheduler

## 2020-05-06 ENCOUNTER — Telehealth: Payer: Self-pay | Admitting: Internal Medicine

## 2020-05-06 NOTE — Progress Notes (Signed)
  Chronic Care Management   Outreach Note  05/06/2020 Name: [name] MRN: [id number] DOB: 1941-01-12  Referred by: Janith Lima, MD Reason for referral : No chief complaint on file.   A second unsuccessful telephone outreach was attempted today. The patient was referred to pharmacist for assistance with care management and care coordination.  Follow Up Plan:   Carley Perdue [employer] Scheduler

## 2020-05-20 ENCOUNTER — Telehealth: Payer: Self-pay | Admitting: Internal Medicine

## 2020-05-20 DIAGNOSIS — E1140 Type 2 diabetes mellitus with diabetic neuropathy, unspecified: Secondary | ICD-10-CM

## 2020-05-20 NOTE — Progress Notes (Signed)
°  Chronic Care Management   Note  05/20/2020 Name: [name] MRN: [id number] DOB: 11-02-1940  [name] is a 79 y.o. year old female who is a primary care patient of Etta Grandchild, MD. I reached out to Anastasio Auerbach by phone today in response to a referral sent by [name]'s PCP, Etta Grandchild, MD.   [name] was given information about Chronic Care Management services today including:  1. CCM service includes personalized support from designated clinical staff supervised by her physician, including individualized plan of care and coordination with other care providers 2. 24/7 contact phone numbers for assistance for urgent and routine care needs. 3. Service will only be billed when office clinical staff spend 20 minutes or more in a month to coordinate care. 4. Only one practitioner may furnish and bill the service in a calendar month. 5. The patient may stop CCM services at any time (effective at the end of the month) by phone call to the office staff.   Patient agreed to services and verbal consent obtained.   Follow up plan:   Carley Perdue [employer] Scheduler

## 2020-06-01 ENCOUNTER — Other Ambulatory Visit: Payer: Self-pay | Admitting: Internal Medicine

## 2020-06-01 DIAGNOSIS — I10 Essential (primary) hypertension: Secondary | ICD-10-CM

## 2020-06-08 ENCOUNTER — Ambulatory Visit: Payer: BLUE CROSS/BLUE SHIELD | Admitting: Neurology

## 2020-06-22 ENCOUNTER — Telehealth: Payer: Self-pay | Admitting: Neurology

## 2020-06-22 NOTE — Telephone Encounter (Signed)
I placed on my list for cancellations.

## 2020-06-22 NOTE — Telephone Encounter (Signed)
Pt.'s daughter [name] is on [place]. She is asking for a sooner appt. than April for mom. I offered her 2 appts. this week with NP & she stated this week is a busy week. Please advise.

## 2020-06-29 ENCOUNTER — Ambulatory Visit (INDEPENDENT_AMBULATORY_CARE_PROVIDER_SITE_OTHER): Payer: Medicare Other | Admitting: Neurology

## 2020-06-29 ENCOUNTER — Encounter: Payer: Self-pay | Admitting: Neurology

## 2020-06-29 VITALS — BP 142/86 | HR 78 | Ht 62.0 in | Wt 140.0 lb

## 2020-06-29 DIAGNOSIS — F0390 Unspecified dementia without behavioral disturbance: Secondary | ICD-10-CM

## 2020-06-29 MED ORDER — CITALOPRAM HYDROBROMIDE 20 MG PO TABS: 20.0000 mg | ORAL_TABLET | Freq: Every day | ORAL | 5 refills | Status: DC

## 2020-06-29 MED ORDER — NAMZARIC 28-10 MG PO CP24: ORAL_CAPSULE | ORAL | 11 refills | Status: DC

## 2020-06-29 NOTE — Progress Notes (Signed)
PATIENT: [name] DOB: 1940/12/19  REASON FOR VISIT: follow up HISTORY FROM: patient  HISTORY OF PRESENT ILLNESS: Today 06/29/20 [name] is a 80 year old female with history of Dementia.  She is on Aricept and Namenda.  She lives with her husband. Is on Celexa 15 mg daily for the mood.  MMSE 14/30 today.  Continues to have issues with the mood, gets frustrated with her husband.  At night, sleeps in the recliner, is up and down all night from the bed to the chair.  She prefers sleeping sitting up.  Weight is stable.  Requires assistance with her ADLs.  Her son and her daughter live nearby, they are there daily.  She does not drive.  She is not especially active.  No recent falls.  She is not always agreeable to take her medications. Has a dear dog, Sheran Spine who is 40. She requires constant supervision. She talks to her parents who are deceased. Here today for evaluation accompanied by her daughter.  HISTORY 12/01/2019 SS: [name] is an 80 year old female with history of memory disorder.  She is on Aricept and Namenda. She lives with her husband, her son and daughter live nearby and check in on her. MMSE 15/30 today, stable from 6 months ago. Weight is stable, up 4 lbs from 6 months ago at 139 lbs. appetite is good, she often forgets when she eats, may eat again.  Is having difficulty sleeping at night, her husband will hear her yelling out, he has to go wake her up.  She is no longer driving, gets irritated at her children for this.  Sometimes her husband has difficulty getting her to take her morning pills.  She does not do any cooking, her husband has to watch her in the kitchen, she may turn the water on and walk away.  She is able to do housework, laundry. She may be irritable in the mornings. She does her own ADLs.  She enjoys watching TV and sitting outside in the carport, no wandering. She loves her 49 year old dog, Sheran Spine.  Presents today accompanied by her husband and son.   REVIEW OF  SYSTEMS: Out of a complete 14 system review of symptoms, the patient complains only of the following symptoms, and all other reviewed systems are negative.  Memory loss  ALLERGIES: Allergies  Allergen Reactions  . Pneumococcal Vaccines Swelling    Per pt when received 2015  . Ampicillin Other (See Comments)    REACTION: causes urticaria  . Neomycin-Bacitracin Zn-Polymyx Rash  . Penicillins Other (See Comments)    REACTION: causes urticaria    HOME MEDICATIONS: Outpatient Medications Prior to Visit  Medication Sig Dispense Refill  . aspirin 81 MG EC tablet Take 1 tablet (81 mg total) by mouth daily. Swallow whole. 30 tablet 12  . citalopram (CELEXA) 10 MG tablet Take 1.5 tablets (15 mg total) by mouth daily. 45 tablet 5  . Colchicine 0.6 MG CAPS Take 1 capsule by mouth 2 (two) times a day. 60 capsule 1  . donepezil (ARICEPT) 10 MG tablet Take 1 tablet (10 mg total) by mouth at bedtime. 90 tablet 3  . hydrochlorothiazide (HYDRODIURIL) 25 MG tablet Take 1 tablet (25 mg total) by mouth daily. 90 tablet 1  . irbesartan (AVAPRO) 150 MG tablet TAKE 1 TABLET(150 MG) BY MOUTH DAILY 90 tablet 1  . levocetirizine (XYZAL) 5 MG tablet TAKE 1 TABLET(5 MG) BY MOUTH EVERY EVENING 90 tablet 1  . memantine (NAMENDA) 10  MG tablet Take 1 tablet (10 mg total) by mouth 2 (two) times daily. 60 tablet 11  . metFORMIN (GLUCOPHAGE) 500 MG tablet TAKE 1 TABLET(500 MG) BY MOUTH TWICE DAILY WITH A MEAL 180 tablet 1  . propranolol (INDERAL) 20 MG tablet TAKE 1 TABLET(20 MG) BY MOUTH TWICE DAILY 180 tablet 0  . simvastatin (ZOCOR) 20 MG tablet TAKE 1 TABLET(20 MG) BY MOUTH DAILY AT 6 PM 90 tablet 1   No facility-administered medications prior to visit.    PAST MEDICAL HISTORY: Past Medical History:  Diagnosis Date  . Contact dermatitis and other eczema, due to unspecified cause   . Displacement of lumbar intervertebral disc without myelopathy   . FH: cataracts   . Gout, unspecified   . Memory loss   .  Occlusion and stenosis of carotid artery without mention of cerebral infarction   . Other and unspecified hyperlipidemia   . Pain in joint, shoulder region   . Pain in limb   . Routine general medical examination at a health care facility   . Sciatica   . Type II or unspecified type diabetes mellitus without mention of complication, uncontrolled   . Unspecified essential hypertension   . Unspecified venous (peripheral) insufficiency     PAST SURGICAL HISTORY: Past Surgical History:  Procedure Laterality Date  . ANTERIOR CERVICAL DECOMP/DISCECTOMY FUSION N/A 12/22/2014   Procedure: Anterior Cervical Four-Five/Five-Six/Six-Seven Decompression/Diskectomy/ and Fsion;  Surgeon: Leeroy Cha, MD;  Location: [facility];  Service: Neurosurgery;  Laterality: N/A;  C4-5 C5-6 C6-7 Anterior cervical decompression/diskectomy/fusion  . LAPAROSCOPIC APPENDECTOMY    . LUMBAR LAMINECTOMY  07/2008   [place]  . TUBAL LIGATION      FAMILY HISTORY: Family History  Problem Relation Age of Onset  . Alzheimer's disease Mother   . Diabetes Mother   . Coronary artery disease Father        CABG  . Hypertension Father   . Cancer Brother   . Cancer Brother   . Colon cancer Neg Hx   . Breast cancer Neg Hx     SOCIAL HISTORY: Social History   Socioeconomic History  . Marital status: Married    Spouse name: Laverna Peace  . Number of children: 3  . Years of education: 43  . Highest education level: Not on file  Occupational History  . Not on file  Tobacco Use  . Smoking status: Former Smoker    Quit date: 05/23/1995    Years since quitting: 25.1  . Smokeless tobacco: Never Used  Substance and Sexual Activity  . Alcohol use: No  . Drug use: No  . Sexual activity: Yes    Partners: Male  Other Topics Concern  . Not on file  Social History Narrative   HSG.  Married '59.  2 sons - '65, '62; 1 dtr '61; 7 grandchildren; 3 great-grandchildren.  Lives w/husband in own home.  END OF LIFE: she does not want  to be kept alive on machines. Needs more information in regard to CPR, etc. Provided w/[employer] guide and MOST (7/09)   Social Determinants of Health   Financial Resource Strain: Low Risk   . Difficulty of Paying Living Expenses: Not hard at all  Food Insecurity: No Food Insecurity  . Worried About Charity fundraiser in the Last Year: Never true  . Ran Out of Food in the Last Year: Never true  Transportation Needs: No Transportation Needs  . Lack of Transportation (Medical): No  . Lack of Transportation (Non-Medical):  No  Physical Activity: Sufficiently Active  . Days of Exercise per Week: 5 days  . Minutes of Exercise per Session: 30 min  Stress: No Stress Concern Present  . Feeling of Stress : Not at all  Social Connections: Socially Integrated  . Frequency of Communication with Friends and Family: More than three times a week  . Frequency of Social Gatherings with Friends and Family: More than three times a week  . Attends Religious Services: More than 4 times per year  . Active Member of Clubs or Organizations: Yes  . Attends Archivist Meetings: More than 4 times per year  . Marital Status: Married  Human resources officer Violence: Not on file   PHYSICAL EXAM  Vitals:   06/29/20 1419  BP: (!) 142/86  Pulse: 78  Weight: 140 lb (63.5 kg)  Height: 5\' 2"  (1.575 m)   Body mass index is 25.61 kg/m.  Generalized: Well developed, in no acute distress  MMSE - Mini Mental State Exam 06/29/2020 03/25/2020 12/01/2019  Not completed: - - -  Orientation to time 0 4 0  Orientation to Place 4 5 4   Registration 3 0 3  Attention/ Calculation 0 5 0  Recall 0 3 0  Language- name 2 objects 2 2 2   Language- repeat 0 1 0  Language- follow 3 step command 3 3 3   Language- read & follow direction 1 1 1   Write a sentence 1 1 1   Copy design 0 1 1  Copy design-comments - - 7 animals  Total score 14 26 15     Neurological examination  Mentation: Alert, cooperative, history is provided  by her daughter, gets defensive, speech is clear, mild difficulty following some exam commands Cranial nerve II-XII: Pupils were equal round reactive to light. Extraocular movements were full, visual field were full on confrontational test. Facial sensation and strength were normal. Head turning and shoulder shrug were normal and symmetric. Motor: Good strength all extremities Sensory: Sensory testing is intact to soft touch on all 4 extremities. No evidence of extinction is noted.  Coordination: Cerebellar testing reveals good finger-nose-finger and heel-to-shin bilaterally.  Gait and station: Gait is slow, cautious, no assistive device, slightly wide-based Reflexes: Deep tendon reflexes are symmetric and normal bilaterally.   DIAGNOSTIC DATA (LABS, IMAGING, TESTING) - I reviewed patient records, labs, notes, testing and imaging myself where available.  Lab Results  Component Value Date   WBC 9.6 03/19/2020   HGB 11.1 (L) 03/19/2020   HCT 33.5 (L) 03/19/2020   MCV 88.6 03/19/2020   PLT 395.0 03/19/2020      Component Value Date/Time   NA 138 03/19/2020 0951   K 4.2 03/19/2020 0951   CL 103 03/19/2020 0951   CO2 28 03/19/2020 0951   GLUCOSE 92 03/19/2020 0951   BUN 49 (H) 03/19/2020 0951   CREATININE 1.39 (H) 03/19/2020 0951   CALCIUM 9.3 03/19/2020 0951   PROT 7.4 03/19/2020 0951   ALBUMIN 4.0 03/19/2020 0951   AST 38 (H) 03/19/2020 0951   ALT 37 (H) 03/19/2020 0951   ALKPHOS 125 (H) 03/19/2020 0951   BILITOT 0.9 03/19/2020 0951   GFRNONAA 59 (L) 12/24/2014 0450   GFRAA >60 12/24/2014 0450   Lab Results  Component Value Date   CHOL 132 08/13/2019   HDL 35.40 (L) 08/13/2019   LDLCALC 65 08/13/2019   LDLDIRECT 80.0 09/26/2017   TRIG 155.0 (H) 08/13/2019   CHOLHDL 4 08/13/2019   Lab Results  Component Value Date  HGBA1C 6.8 (H) 03/19/2020   Lab Results  Component Value Date   VITAMINB12 602 10/30/2017   Lab Results  Component Value Date   TSH 2.96 08/13/2019     ASSESSMENT AND PLAN 80 y.o. year old female  has a past medical history of Contact dermatitis and other eczema, due to unspecified cause, Displacement of lumbar intervertebral disc without myelopathy, FH: cataracts, Gout, unspecified, Memory loss, Occlusion and stenosis of carotid artery without mention of cerebral infarction, Other and unspecified hyperlipidemia, Pain in joint, shoulder region, Pain in limb, Routine general medical examination at a health care facility, Sciatica, Type II or unspecified type diabetes mellitus without mention of complication, uncontrolled, Unspecified essential hypertension, and Unspecified venous (peripheral) insufficiency. here with:  1.  Dementia  She continues to have slow decline in her cognitive function.  MMSE was 14/30 today.  I will switch her to [facility], as her family has a difficult time getting her to take her medications.  I will increase the Celexa to 20 mg daily, due to reported worsening mood issues.  She can continue to take melatonin if she is willing to help with sleep.  Did discuss with her daughter, considering addition of Seroquel, lower dose Celexa, even discontinue. Worries would be too sedating.  She will follow-up in our office in 6 months or sooner if needed, it is okay for her to continue follow-up with her PCP, follow-up in our office on an as-needed basis.  I spent 30 minutes of face-to-face and non-face-to-face time with patient.  This included previsit chart review, lab review, study review, order entry, electronic health record documentation, patient education.  Butler Denmark, AGNP-C, DNP 06/29/2020, 2:36 PM [facility] [address] [place], [place] [address] [phone]

## 2020-06-29 NOTE — Patient Instructions (Signed)
Increase the Celexa to 20 mg daily Will try to get the combo pill for Aricept and Namenda See you back in 6 months

## 2020-06-30 NOTE — Progress Notes (Signed)
I have read the note, and I agree with the clinical assessment and plan.  [name]   

## 2020-07-05 ENCOUNTER — Telehealth: Payer: Self-pay | Admitting: Pharmacist

## 2020-07-05 NOTE — Progress Notes (Signed)
Chronic Care Management Pharmacy Assistant   Name: [name]  MRN: [id number] DOB: [birth year]  Reason for Encounter: Initial Questions   PCP : Janith Lima, MD  Allergies:   Allergies  Allergen Reactions  . Pneumococcal Vaccines Swelling    Per pt when received 2015  . Ampicillin Other (See Comments)    REACTION: causes urticaria  . Neomycin-Bacitracin Zn-Polymyx Rash  . Penicillins Other (See Comments)    REACTION: causes urticaria    Medications: Outpatient Encounter Medications as of 07/05/2020  Medication Sig  . aspirin 81 MG EC tablet Take 1 tablet (81 mg total) by mouth daily. Swallow whole.  . citalopram (CELEXA) 20 MG tablet Take 1 tablet (20 mg total) by mouth daily.  . Colchicine 0.6 MG CAPS Take 1 capsule by mouth 2 (two) times a day.  . hydrochlorothiazide (HYDRODIURIL) 25 MG tablet Take 1 tablet (25 mg total) by mouth daily.  . irbesartan (AVAPRO) 150 MG tablet TAKE 1 TABLET(150 MG) BY MOUTH DAILY  . levocetirizine (XYZAL) 5 MG tablet TAKE 1 TABLET(5 MG) BY MOUTH EVERY EVENING  . Memantine HCl-Donepezil HCl (NAMZARIC) 28-10 MG CP24 Take 1 capsule at bedtime  . metFORMIN (GLUCOPHAGE) 500 MG tablet TAKE 1 TABLET(500 MG) BY MOUTH TWICE DAILY WITH A MEAL  . propranolol (INDERAL) 20 MG tablet TAKE 1 TABLET(20 MG) BY MOUTH TWICE DAILY  . simvastatin (ZOCOR) 20 MG tablet TAKE 1 TABLET(20 MG) BY MOUTH DAILY AT 6 PM   No facility-administered encounter medications on file as of 07/05/2020.    Current Diagnosis: Patient Active Problem List   Diagnosis Date Noted  . Chronic renal disease, stage 3, moderately decreased glomerular filtration rate (GFR) between 30-59 mL/min/1.73 square meter ([place]) 09/26/2018  . Senile dementia without behavioral disturbance ([place]) 03/21/2017  . Atherosclerosis of native artery of both lower extremities with intermittent claudication ([place]) 11/24/2015  . Cervical stenosis of spinal canal 12/22/2014  . Visit for screening mammogram  09/16/2014  . Routine health maintenance 01/03/2011  . Peripheral neuropathy ([place]) 08/23/2010    Class: Diagnosis of  . DM (diabetes mellitus) type II controlled, neurological manifestation ([place]) 07/21/2010  . Carotid artery stenosis 12/15/2009  . DEGENERATIVE DISC DISEASE, LUMBOSACRAL SPINE W/RADICULOPATHY 12/29/2008  . Dyslipidemia, goal LDL below 70 02/16/2007  . Gout 02/16/2007  . Essential hypertension 02/16/2007    Goals Addressed   None     Follow-Up:  Pharmacist Review   Have you seen any other providers since your last visit? Spoke with patient's daughter [name] who states the the patient last seen her Neurologist for dementia  Any changes in your medications or health? Daughter states that the patients citalopram was increased and that she does not have any new health issues  Any side effects from any medications? Daughter states that the patient does not have any side effects from medication - Do you have an symptoms or problems not managed by your medications? Daughter states that patient has no symptoms or problems that are not managed by medications  Any concerns about your health right now? Daughter states that she is looking to get some physical therapy for the patient who is having some issues with legs  Has your provider asked that you check blood pressure, blood sugar, or follow special diet at home? Daughter states that patient's blood pressure is normal and that she gets blood sugar check at the office  Do you get any type of exercise on a regular basis? Daughter states that patient's  legs are not strong enough to exercise  Can you think of a goal you would like to reach for your health? Daughter does not states a goal for the patient  Do you have any problems getting your medications? Daughter has no problems with getting medication from pharmacy  Is there anything that you would like to discuss during the appointment? There is nothing specific to discuss  except the physical therapy  Please bring medications and supplements to appointment   Wendy Poet, [place] [phone]

## 2020-07-08 ENCOUNTER — Ambulatory Visit (INDEPENDENT_AMBULATORY_CARE_PROVIDER_SITE_OTHER): Payer: Medicare Other | Admitting: Pharmacist

## 2020-07-08 ENCOUNTER — Other Ambulatory Visit: Payer: Self-pay

## 2020-07-08 DIAGNOSIS — E1140 Type 2 diabetes mellitus with diabetic neuropathy, unspecified: Secondary | ICD-10-CM | POA: Diagnosis not present

## 2020-07-08 DIAGNOSIS — E785 Hyperlipidemia, unspecified: Secondary | ICD-10-CM

## 2020-07-08 DIAGNOSIS — N1832 Chronic kidney disease, stage 3b: Secondary | ICD-10-CM

## 2020-07-08 DIAGNOSIS — I10 Essential (primary) hypertension: Secondary | ICD-10-CM

## 2020-07-08 DIAGNOSIS — F0390 Unspecified dementia without behavioral disturbance: Secondary | ICD-10-CM | POA: Diagnosis not present

## 2020-07-08 DIAGNOSIS — M1000 Idiopathic gout, unspecified site: Secondary | ICD-10-CM

## 2020-07-08 NOTE — Patient Instructions (Addendum)
Visit Information  Phone number for Pharmacist: [phone]  Thank you for meeting with me to discuss your medications! I look forward to working with you to achieve your health care goals. Below is a summary of what we talked about during the visit:  Goals Addressed            This Visit's Progress   . Manage My Medicine       Timeframe:  Long-Range Goal Priority:  Medium Start Date:     07/08/20                        Expected End Date: 07/08/21                      Follow Up Date 01/19/21   - call for medicine refill 2 or 3 days before it runs out - call if I am sick and can't take my medicine - keep a list of all the medicines I take; vitamins and herbals too - use a pillbox to sort medicine    Why is this important?   . These steps will help you keep on track with your medicines.        Patient Care Plan: CCM Pharmacy Care Plan    Problem Identified: Hypertension, Hyperlipidemia, Diabetes, Chronic Kidney Disease, Gout and dementia with behavioral disturbance   Priority: High    Long-Range Goal: Disease management   Start Date: 07/08/2020  Expected End Date: 07/08/2021  This Visit's Progress: On track  Priority: High  Note:   Current Barriers:  . Unable to independently monitor therapeutic efficacy  Pharmacist Clinical Goal(s):  Marland Kitchen Over the next 365 days, patient will achieve adherence to monitoring guidelines and medication adherence to achieve therapeutic efficacy through collaboration with PharmD and provider.   Interventions: . 1:1 collaboration with Janith Lima, MD regarding development and update of comprehensive plan of care as evidenced by provider attestation and co-signature . Inter-disciplinary care team collaboration (see longitudinal plan of care) . Comprehensive medication review performed; medication list updated in electronic medical record  Hypertension (BP goal <130/80) -controlled -Current treatment: . Propranolol 20 mg BID . Irbesartan  150 mg daily . HCTZ 25 mg daily -Current home BP readings: not checking -Denies hypotensive/hypertensive symptoms -Educated on BP goals and benefits of medications for prevention of heart attack, stroke and kidney damage; Daily salt intake goal < 2300 mg; -Counseled to monitor BP at home as needed, document, and provide log at future appointments -Recommended to continue current medication  Hyperlipidemia: (LDL goal < 70) -controlled -Current treatment: . Simvastatin 20 mg daily . Aspirin 81 mg daily -Educated on Cholesterol goals;  Benefits of statin for ASCVD risk reduction; -Recommended to continue current medication  Diabetes (A1c goal <7%) -controlled  -Current medications: Marland Kitchen Metformin 500 mg BID -Medications previously tried: n/a -current dietary patterns: pt has been eating a lot of bananas recently (2-3 per day); discussed high carb content in bananas may raise blood sugars/A1c  -Current home glucose readings: home testing not indicated -Denies hypoglycemic/hyperglycemic symptoms -Educated onA1c and blood sugar goals; -Counseled to check feet daily and get yearly eye exams -Recommended to continue current medication  -Recommend to reduce bananas to 1 per day  Dementia w/ behavioral disturbance (Goal: slow progression and manage symptoms) -controlled - increasing citalopram to 20 mg has helped with anger/irritability per family -Current treatment: . Citalopram 20 mg daily AM . Memantine-donepezil (Namzaric) 28-10 mg HS (not yet  approved by insurance) . Donepezil 10 mg AM . Memantine 10 mg BID -PHQ9: 0 -GAD7: not on file -Connected with neurologist for mental health support -Educated on Benefits of medication for symptom control -Recommended to continue current medication  Gout (Goal: manage symptoms) -controlled - no gout flares in past 2 years -Current treatment  . Colchicine 0.6 mg BID PRN  -Counseled on using colchicine for flares as directed by  physician  Health Maintenance -Vaccine gaps: COVID vaccine -Current therapy:  . Levocetirizine 5 mg daily  -Patient is satisfied with current therapy and denies issues -Recommended to continue current medication  Patient Goals/Self-Care Activities . Over the next 365 days, patient will:  - take medications as prescribed focus on medication adherence by pill box  Follow Up Plan: Telephone follow up appointment with care management team member scheduled for: 1 year      [name] was given information about Chronic Care Management services today including:  1. CCM service includes personalized support from designated clinical staff supervised by her physician, including individualized plan of care and coordination with other care providers 2. 24/7 contact phone numbers for assistance for urgent and routine care needs. 3. Standard insurance, coinsurance, copays and deductibles apply for chronic care management only during months in which we provide at least 20 minutes of these services. Most insurances cover these services at 100%, however patients may be responsible for any copay, coinsurance and/or deductible if applicable. This service may help you avoid the need for more expensive face-to-face services. 4. Only one practitioner may furnish and bill the service in a calendar month. 5. The patient may stop CCM services at any time (effective at the end of the month) by phone call to the office staff.  Patient agreed to services and verbal consent obtained.   The patient verbalized understanding of instructions, educational materials, and care plan provided today and agreed to receive a mailed copy of patient instructions, educational materials, and care plan.  Telephone follow up appointment with pharmacy team member scheduled for: 1 year  Charlene Brooke, PharmD, [facility] Clinical Pharmacist [facility] [phone]  [email or web address]  Carbohydrate Counting for Diabetes Mellitus, Adult Carbohydrate counting is a method of keeping track of how many carbohydrates you eat. Eating carbohydrates naturally increases the amount of sugar (glucose) in the blood. Counting how many carbohydrates you eat improves your blood glucose control, which helps you manage your diabetes. It is important to know how many carbohydrates you can safely have in each meal. This is different for every person. A dietitian can help you make a meal plan and calculate how many carbohydrates you should have at each meal and snack. What foods contain carbohydrates? Carbohydrates are found in the following foods:  Grains, such as breads and cereals.  Dried beans and soy products.  Starchy vegetables, such as potatoes, peas, and corn.  Fruit and fruit juices.  Milk and yogurt.  Sweets and snack foods, such as cake, cookies, candy, chips, and soft drinks.   How do I count carbohydrates in foods? There are two ways to count carbohydrates in food. You can read food labels or learn standard serving sizes of foods. You can use either of the methods or a combination of both. Using the Nutrition Facts label The Nutrition Facts list is included on the labels of almost all packaged foods and beverages in the [place] It includes:  The serving size.  Information about nutrients in each serving, including the grams (g)  of carbohydrate per serving. To use the Nutrition Facts:  Decide how many servings you will have.  Multiply the number of servings by the number of carbohydrates per serving.  The resulting number is the total amount of carbohydrates that you will be having. Learning the standard serving sizes of foods When you eat carbohydrate foods that are not packaged or do not include Nutrition Facts on the label, you  need to measure the servings in order to count the amount of carbohydrates.  Measure the foods that you will eat with a food scale or measuring cup, if needed.  Decide how many standard-size servings you will eat.  Multiply the number of servings by 15. For foods that contain carbohydrates, one serving equals 15 g of carbohydrates. ? For example, if you eat 2 cups or 10 oz (300 g) of strawberries, you will have eaten 2 servings and 30 g of carbohydrates (2 servings x 15 g = 30 g).  For foods that have more than one food mixed, such as soups and casseroles, you must count the carbohydrates in each food that is included. The following list contains standard serving sizes of common carbohydrate-rich foods. Each of these servings has about 15 g of carbohydrates:  1 slice of bread.  1 six-inch (15 cm) tortilla.  ? cup or 2 oz (53 g) cooked rice or pasta.   cup or 3 oz (85 g) cooked or canned, drained and rinsed beans or lentils.   cup or 3 oz (85 g) starchy vegetable, such as peas, corn, or squash.   cup or 4 oz (120 g) hot cereal.   cup or 3 oz (85 g) boiled or mashed potatoes, or  or 3 oz (85 g) of a large baked potato.   cup or 4 fl oz (118 mL) fruit juice.  1 cup or 8 fl oz (237 mL) milk.  1 small or 4 oz (106 g) apple.   or 2 oz (63 g) of a medium banana.  1 cup or 5 oz (150 g) strawberries.  3 cups or 1 oz (24 g) popped popcorn. What is an example of carbohydrate counting? To calculate the number of carbohydrates in this sample meal, follow the steps shown below. Sample meal  3 oz (85 g) chicken breast.  ? cup or 4 oz (106 g) brown rice.   cup or 3 oz (85 g) corn.  1 cup or 8 fl oz (237 mL) milk.  1 cup or 5 oz (150 g) strawberries with sugar-free whipped topping. Carbohydrate calculation 1. Identify the foods that contain carbohydrates: ? Rice. ? Corn. ? Milk. ? Strawberries. 2. Calculate how many servings you have of each food: ? 2 servings  rice. ? 1 serving corn. ? 1 serving milk. ? 1 serving strawberries. 3. Multiply each number of servings by 15 g: ? 2 servings rice x 15 g = 30 g. ? 1 serving corn x 15 g = 15 g. ? 1 serving milk x 15 g = 15 g. ? 1 serving strawberries x 15 g = 15 g. 4. Add together all of the amounts to find the total grams of carbohydrates eaten: ? 30 g + 15 g + 15 g + 15 g = 75 g of carbohydrates total. What are tips for following this plan? Shopping  Develop a meal plan and then make a shopping list.  Buy fresh and frozen vegetables, fresh and frozen fruit, dairy, eggs, beans, lentils, and whole grains.  Look at  food labels. Choose foods that have more fiber and less sugar.  Avoid processed foods and foods with added sugars. Meal planning  Aim to have the same amount of carbohydrates at each meal and for each snack time.  Plan to have regular, balanced meals and snacks. Where to find more information  American Diabetes Association: www.diabetes.org  Centers for Disease Control and Prevention: http://www.wolf.info/ Summary  Carbohydrate counting is a method of keeping track of how many carbohydrates you eat.  Eating carbohydrates naturally increases the amount of sugar (glucose) in the blood.  Counting how many carbohydrates you eat improves your blood glucose control, which helps you manage your diabetes.  A dietitian can help you make a meal plan and calculate how many carbohydrates you should have at each meal and snack. This information is not intended to replace advice given to you by your health care provider. Make sure you discuss any questions you have with your health care provider. Document Revised: 05/08/2019 Document Reviewed: 05/09/2019 Elsevier Patient Education  2021 [employer].

## 2020-07-08 NOTE — Progress Notes (Signed)
Chronic Care Management Pharmacy Note  07/08/2020 Name:  [name] MRN:  [id number] DOB:  1940/08/08  Subjective: [name] is an 80 y.o. year old female who is a primary patient of Janith Lima, MD.  The CCM team was consulted for assistance with disease management and care coordination needs.    Engaged with patient by telephone for initial visit in response to provider referral for pharmacy case management and/or care coordination services.   Consent to Services:  The patient was given the following information about Chronic Care Management services today, agreed to services, and gave verbal consent: 1. CCM service includes personalized support from designated clinical staff supervised by the primary care provider, including individualized plan of care and coordination with other care providers 2. 24/7 contact phone numbers for assistance for urgent and routine care needs. 3. Service will only be billed when office clinical staff spend 20 minutes or more in a month to coordinate care. 4. Only one practitioner may furnish and bill the service in a calendar month. 5.The patient may stop CCM services at any time (effective at the end of the month) by phone call to the office staff. 6. The patient will be responsible for cost sharing (co-pay) of up to 20% of the service fee (after annual deductible is met). Patient agreed to services and consent obtained.  Patient Care Team: Janith Lima, MD as PCP - General (Internal Medicine) Leeroy Cha, MD (Neurosurgery) Newton Pigg, MD (Obstetrics and Gynecology) Druscilla Brownie, MD (Dermatology) Marica Otter, [place] (Optometry) Charlton Haws, [facility] as Pharmacist (Pharmacist)  Recent office visits: 03/19/20 NP Jodi Mourning OV: acute visit for rash on legs. Rx'd prednisone and Bactrim. CBC - slightly anemic. BMP - GFR worse. A1c stable.  08/13/19 Dr Ronnald Ramp OV: chronic f/u. Labs stable, no med changes.  Recent consult  visits: 06/29/20 NP Butler Denmark (neurology): increase celexa to 20 mg, combine aricept and namenda into 1 pill.  Hospital visits: None in previous 6 months  Objective:  Lab Results  Component Value Date   CREATININE 1.39 (H) 03/19/2020   BUN 49 (H) 03/19/2020   GFR 35.87 (L) 03/19/2020   GFRNONAA 59 (L) 12/24/2014   GFRAA >60 12/24/2014   NA 138 03/19/2020   K 4.2 03/19/2020   CALCIUM 9.3 03/19/2020   CO2 28 03/19/2020    Lab Results  Component Value Date/Time   HGBA1C 6.8 (H) 03/19/2020 09:51 AM   HGBA1C 6.7 (A) 08/13/2019 10:04 AM   HGBA1C 6.9 (H) 09/26/2018 11:04 AM   GFR 35.87 (L) 03/19/2020 09:51 AM   GFR 55.28 (L) 08/13/2019 10:46 AM   MICROALBUR 1.6 09/26/2018 11:04 AM   MICROALBUR <0.7 09/26/2017 11:27 AM    Last diabetic Eye exam:  Lab Results  Component Value Date/Time   HMDIABEYEEXA No Retinopathy 01/15/2019 12:00 AM    Last diabetic Foot exam: No results found for: HMDIABFOOTEX   Lab Results  Component Value Date   CHOL 132 08/13/2019   HDL 35.40 (L) 08/13/2019   LDLCALC 65 08/13/2019   LDLDIRECT 80.0 09/26/2017   TRIG 155.0 (H) 08/13/2019   CHOLHDL 4 08/13/2019    Hepatic Function Latest Ref Rng & Units 03/19/2020 08/13/2019 09/26/2017  Total Protein 6.0 - 8.3 g/dL 7.4 7.8 7.3  Albumin 3.5 - 5.2 g/dL 4.0 4.2 4.1  AST 0 - 37 U/L 38(H) 30 23  ALT 0 - 35 U/L 37(H) 42(H) 22  Alk Phosphatase 39 - 117 U/L 125(H) 132(H) 86  Total Bilirubin 0.2 - 1.2 mg/dL 0.9 1.0 1.0  Bilirubin, Direct 0.0 - 0.3 mg/dL - 0.2 -    Lab Results  Component Value Date/Time   TSH 2.96 08/13/2019 10:46 AM   TSH 2.09 09/26/2017 11:27 AM   FREET4 0.9 01/12/2009 11:19 AM    CBC Latest Ref Rng & Units 03/19/2020 08/13/2019 09/26/2018  WBC 4.0 - 10.5 K/uL 9.6 9.7 8.3  Hemoglobin 12.0 - 15.0 g/dL 11.1(L) 12.2 12.2  Hematocrit 36.0 - 46.0 % 33.5(L) 36.9 36.7  Platelets 150.0 - 400.0 K/uL 395.0 380.0 383.0    No results found for: VD25OH  Clinical ASCVD: Yes  The ASCVD Risk  score Mikey Bussing DC Jr., et al., 2013) failed to calculate for the following reasons:   The 2013 ASCVD risk score is only valid for ages 21 to 58    Depression screen PHQ 2/9 03/25/2020 08/13/2019 12/11/2017  Decreased Interest 0 0 0  Down, Depressed, Hopeless 0 0 0  PHQ - 2 Score 0 0 0    No flowsheet data found.  No flowsheet data found.  Results for [name], [name] (MRN [id number]) as of 07/08/2020 07:58  Ref. Range 04/18/2016 08:40 09/26/2018 11:04  Uric Acid, Serum Latest Ref Range: 2.4 - 7.0 mg/dL 7.6 (H) 6.2    Social History   Tobacco Use  Smoking Status Former Smoker  . Quit date: 05/23/1995  . Years since quitting: 25.1  Smokeless Tobacco Never Used   BP Readings from Last 3 Encounters:  06/29/20 (!) 142/86  03/25/20 140/70  03/19/20 130/68   Pulse Readings from Last 3 Encounters:  06/29/20 78  03/25/20 (!) 55  03/19/20 (!) 58   Wt Readings from Last 3 Encounters:  06/29/20 140 lb (63.5 kg)  03/25/20 136 lb (61.7 kg)  03/19/20 139 lb (63 kg)    Assessment/Interventions: Review of patient past medical history, allergies, medications, health status, including review of consultants reports, laboratory and other test data, was performed as part of comprehensive evaluation and provision of chronic care management services.   SDOH:  (Social Determinants of Health) assessments and interventions performed: Yes   CCM Care Plan  Allergies  Allergen Reactions  . Pneumococcal Vaccines Swelling    Per pt when received 2015  . Ampicillin Other (See Comments)    REACTION: causes urticaria  . Neomycin-Bacitracin Zn-Polymyx Rash  . Penicillins Other (See Comments)    REACTION: causes urticaria    Medications Reviewed Today    Reviewed by Suzzanne Cloud, NP (Nurse Practitioner) on 06/29/20 at [place] List Status: <None>  Medication Order Taking? Sig Documenting Provider Last Dose Status Informant  aspirin 81 MG EC tablet [id number] Yes Take 1 tablet (81 mg total) by mouth  daily. Swallow whole. Janith Lima, MD Taking Active   citalopram (CELEXA) 20 MG tablet [id number] Yes Take 1 tablet (20 mg total) by mouth daily. Suzzanne Cloud, NP  Active   Colchicine 0.6 MG CAPS [id number] Yes Take 1 capsule by mouth 2 (two) times a day. Janith Lima, MD Taking Active   hydrochlorothiazide (HYDRODIURIL) 25 MG tablet [id number] Yes Take 1 tablet (25 mg total) by mouth daily. Janith Lima, MD Taking Active   irbesartan (AVAPRO) 150 MG tablet [id number] Yes TAKE 1 TABLET(150 MG) BY MOUTH DAILY Janith Lima, MD Taking Active   levocetirizine (XYZAL) 5 MG tablet [id number] Yes TAKE 1 TABLET(5 MG) BY MOUTH EVERY EVENING Janith Lima, MD Taking Active   Memantine HCl-Donepezil HCl (  NAMZARIC) 28-10 MG CP24 [id number] Yes Take 1 capsule at bedtime Suzzanne Cloud, NP  Active   metFORMIN (GLUCOPHAGE) 500 MG tablet [id number] Yes TAKE 1 TABLET(500 MG) BY MOUTH TWICE DAILY WITH A MEAL Janith Lima, MD Taking Active   propranolol (INDERAL) 20 MG tablet [id number] Yes TAKE 1 TABLET(20 MG) BY MOUTH TWICE DAILY Janith Lima, MD Taking Active   simvastatin (ZOCOR) 20 MG tablet [id number] Yes TAKE 1 TABLET(20 MG) BY MOUTH DAILY AT 6 PM Janith Lima, MD Taking Active           Patient Active Problem List   Diagnosis Date Noted  . Chronic renal disease, stage 3, moderately decreased glomerular filtration rate (GFR) between 30-59 mL/min/1.73 square meter ([place]) 09/26/2018  . Senile dementia without behavioral disturbance ([place]) 03/21/2017  . Atherosclerosis of native artery of both lower extremities with intermittent claudication ([place]) 11/24/2015  . Cervical stenosis of spinal canal 12/22/2014  . Visit for screening mammogram 09/16/2014  . Routine health maintenance 01/03/2011  . Peripheral neuropathy ([place]) 08/23/2010    Class: Diagnosis of  . DM (diabetes mellitus) type II controlled, neurological manifestation ([place]) 07/21/2010  . Carotid artery stenosis 12/15/2009  .  DEGENERATIVE DISC DISEASE, LUMBOSACRAL SPINE W/RADICULOPATHY 12/29/2008  . Dyslipidemia, goal LDL below 70 02/16/2007  . Gout 02/16/2007  . Essential hypertension 02/16/2007    Immunization History  Administered Date(s) Administered  . Fluad Quad(high Dose 65+) 08/13/2019, 03/25/2020  . Influenza, High Dose Seasonal PF 03/11/2013, 04/21/2015, 04/18/2016, 03/12/2017, 02/20/2018  . Influenza,inj,Quad PF,6+ Mos 03/31/2014  . Pneumococcal Conjugate-13 07/22/2013  . Pneumococcal Polysaccharide-23 08/13/2019  . Td 08/14/2007  . Tdap 09/26/2017    Conditions to be addressed/monitored:  Hypertension, Hyperlipidemia, Diabetes, Chronic Kidney Disease, Gout and dementia with behavioral disturbance  Care Plan : [place]  Updates made by Charlton Haws, [place] since 07/08/2020 12:00 AM    Problem: Hypertension, Hyperlipidemia, Diabetes, Chronic Kidney Disease, Gout and dementia with behavioral disturbance   Priority: High    Long-Range Goal: Disease management   Start Date: 07/08/2020  Expected End Date: 07/08/2021  This Visit's Progress: On track  Priority: High  Note:   Current Barriers:  . Unable to independently monitor therapeutic efficacy  Pharmacist Clinical Goal(s):  Marland Kitchen Over the next 365 days, patient will achieve adherence to monitoring guidelines and medication adherence to achieve therapeutic efficacy through collaboration with PharmD and provider.   Interventions: . 1:1 collaboration with Janith Lima, MD regarding development and update of comprehensive plan of care as evidenced by provider attestation and co-signature . Inter-disciplinary care team collaboration (see longitudinal plan of care) . Comprehensive medication review performed; medication list updated in electronic medical record  Hypertension (BP goal <130/80) -controlled -Current treatment: . Propranolol 20 mg BID . Irbesartan 150 mg daily . HCTZ 25 mg daily -Current home BP readings: not  checking -Denies hypotensive/hypertensive symptoms -Educated on BP goals and benefits of medications for prevention of heart attack, stroke and kidney damage; Daily salt intake goal < 2300 mg; -Counseled to monitor BP at home as needed, document, and provide log at future appointments -Recommended to continue current medication  Hyperlipidemia: (LDL goal < 70) -controlled -Current treatment: . Simvastatin 20 mg daily . Aspirin 81 mg daily -Educated on Cholesterol goals;  Benefits of statin for ASCVD risk reduction; -Recommended to continue current medication  Diabetes (A1c goal <7%) -controlled  -Current medications: Marland Kitchen Metformin 500 mg BID -Medications previously tried: n/a -current dietary patterns: pt  has been eating a lot of bananas recently (2-3 per day); discussed high carb content in bananas may raise blood sugars/A1c  -Current home glucose readings: home testing not indicated -Denies hypoglycemic/hyperglycemic symptoms -Educated onA1c and blood sugar goals; -Counseled to check feet daily and get yearly eye exams -Recommended to continue current medication  -Recommend to reduce bananas to 1 per day  Dementia w/ behavioral disturbance (Goal: slow progression and manage symptoms) -controlled - increasing citalopram to 20 mg has helped with anger/irritability per family -Current treatment: . Citalopram 20 mg daily AM . Memantine-donepezil (Namzaric) 28-10 mg HS (not yet approved by insurance) . Donepezil 10 mg AM . Memantine 10 mg BID -PHQ9: 0 -GAD7: not on file -Connected with neurologist for mental health support -Educated on Benefits of medication for symptom control -Recommended to continue current medication  Gout (Goal: manage symptoms) -controlled - no gout flares in past 2 years -Current treatment  . Colchicine 0.6 mg BID PRN  -Counseled on using colchicine for flares as directed by physician  Health Maintenance -Vaccine gaps: COVID vaccine -Current  therapy:  . Levocetirizine 5 mg daily  -Patient is satisfied with current therapy and denies issues -Recommended to continue current medication  Patient Goals/Self-Care Activities . Over the next 365 days, patient will:  - take medications as prescribed focus on medication adherence by pill box  Follow Up Plan: Telephone follow up appointment with care management team member scheduled for: 1 year      Medication Assistance: None required.  Patient affirms current coverage meets needs.  Patient's preferred pharmacy is:  [id number] - [address], [place] [address] Phone: [phone] Fax: [phone]  Uses pill box? Yes - son sets up pill box each week Pt endorses 100% compliance  We discussed: Current pharmacy is preferred with insurance plan and patient is satisfied with pharmacy services Patient decided to: Continue current medication management strategy  Care Plan and Follow Up Patient Decision:  Patient agrees to Care Plan and Follow-up.  Plan: Telephone follow up appointment with care management team member scheduled for:  1 year  Charlene Brooke, PharmD, [facility] Clinical Pharmacist [facility] at [facility] [phone]

## 2020-07-15 NOTE — Addendum Note (Signed)
Addended by: Hinda Kehr on: 07/15/2020 08:02 AM   Modules accepted: Orders

## 2020-07-27 ENCOUNTER — Ambulatory Visit (INDEPENDENT_AMBULATORY_CARE_PROVIDER_SITE_OTHER): Payer: Medicare Other | Admitting: Internal Medicine

## 2020-07-27 ENCOUNTER — Encounter: Payer: Self-pay | Admitting: Internal Medicine

## 2020-07-27 ENCOUNTER — Other Ambulatory Visit: Payer: Self-pay

## 2020-07-27 VITALS — BP 126/78 | HR 65 | Temp 98.1°F | Ht 62.0 in | Wt 145.0 lb

## 2020-07-27 DIAGNOSIS — M1000 Idiopathic gout, unspecified site: Secondary | ICD-10-CM

## 2020-07-27 DIAGNOSIS — L84 Corns and callosities: Secondary | ICD-10-CM | POA: Diagnosis not present

## 2020-07-27 DIAGNOSIS — N1832 Chronic kidney disease, stage 3b: Secondary | ICD-10-CM | POA: Diagnosis not present

## 2020-07-27 DIAGNOSIS — D539 Nutritional anemia, unspecified: Secondary | ICD-10-CM | POA: Diagnosis not present

## 2020-07-27 DIAGNOSIS — I10 Essential (primary) hypertension: Secondary | ICD-10-CM | POA: Diagnosis not present

## 2020-07-27 DIAGNOSIS — E785 Hyperlipidemia, unspecified: Secondary | ICD-10-CM

## 2020-07-27 DIAGNOSIS — F5104 Psychophysiologic insomnia: Secondary | ICD-10-CM

## 2020-07-27 DIAGNOSIS — E1140 Type 2 diabetes mellitus with diabetic neuropathy, unspecified: Secondary | ICD-10-CM | POA: Diagnosis not present

## 2020-07-27 LAB — LIPID PANEL
Cholesterol: 109 mg/dL (ref 0–200)
HDL: 32.3 mg/dL — ABNORMAL LOW (ref >39.00)
NonHDL: 76.29
Total CHOL/HDL Ratio: 3
Triglycerides: 250 mg/dL — ABNORMAL HIGH (ref 0.0–149.0)
VLDL: 50 mg/dL — ABNORMAL HIGH (ref 0.0–40.0)

## 2020-07-27 LAB — BASIC METABOLIC PANEL
BUN: 23 mg/dL (ref 6–23)
CO2: 30 mEq/L (ref 19–32)
Calcium: 9.7 mg/dL (ref 8.4–10.5)
Chloride: 100 mEq/L (ref 96–112)
Creatinine, Ser: 1.14 mg/dL (ref 0.40–1.20)
GFR: 45.39 mL/min — ABNORMAL LOW (ref >60.00)
Glucose, Bld: 157 mg/dL — ABNORMAL HIGH (ref 70–99)
Potassium: 4 mEq/L (ref 3.5–5.1)
Sodium: 139 mEq/L (ref 135–145)

## 2020-07-27 LAB — HEPATIC FUNCTION PANEL
ALT: 60 U/L — ABNORMAL HIGH (ref 0–35)
AST: 45 U/L — ABNORMAL HIGH (ref 0–37)
Albumin: 3.8 g/dL (ref 3.5–5.2)
Alkaline Phosphatase: 237 U/L — ABNORMAL HIGH (ref 39–117)
Bilirubin, Direct: 0.2 mg/dL (ref 0.0–0.3)
Total Bilirubin: 0.9 mg/dL (ref 0.2–1.2)
Total Protein: 7.8 g/dL (ref 6.0–8.3)

## 2020-07-27 LAB — CBC WITH DIFFERENTIAL/PLATELET
Basophils Absolute: 0.1 10*3/uL (ref 0.0–0.1)
Basophils Relative: 0.5 % (ref 0.0–3.0)
Eosinophils Absolute: 1 10*3/uL — ABNORMAL HIGH (ref 0.0–0.7)
Eosinophils Relative: 9.3 % — ABNORMAL HIGH (ref 0.0–5.0)
HCT: 34.5 % — ABNORMAL LOW (ref 36.0–46.0)
Hemoglobin: 11.5 g/dL — ABNORMAL LOW (ref 12.0–15.0)
Lymphocytes Relative: 24.1 % (ref 12.0–46.0)
Lymphs Abs: 2.7 10*3/uL (ref 0.7–4.0)
MCHC: 33.4 g/dL (ref 30.0–36.0)
MCV: 86.7 fl (ref 78.0–100.0)
Monocytes Absolute: 1 10*3/uL (ref 0.1–1.0)
Monocytes Relative: 8.9 % (ref 3.0–12.0)
Neutro Abs: 6.4 10*3/uL (ref 1.4–7.7)
Neutrophils Relative %: 57.2 % (ref 43.0–77.0)
Platelets: 429 10*3/uL — ABNORMAL HIGH (ref 150.0–400.0)
RBC: 3.98 Mil/uL (ref 3.87–5.11)
RDW: 15.2 % (ref 11.5–15.5)
WBC: 11.1 10*3/uL — ABNORMAL HIGH (ref 4.0–10.5)

## 2020-07-27 LAB — FERRITIN: Ferritin: 22.6 ng/mL (ref 10.0–291.0)

## 2020-07-27 LAB — LDL CHOLESTEROL, DIRECT: Direct LDL: 62 mg/dL

## 2020-07-27 LAB — URIC ACID: Uric Acid, Serum: 8.2 mg/dL — ABNORMAL HIGH (ref 2.4–7.0)

## 2020-07-27 LAB — TSH: TSH: 2.8 u[IU]/mL (ref 0.35–4.50)

## 2020-07-27 LAB — HEMOGLOBIN A1C: Hgb A1c MFr Bld: 7.1 % — ABNORMAL HIGH (ref 4.6–6.5)

## 2020-07-27 LAB — VITAMIN B12: Vitamin B-12: 448 pg/mL (ref 211–911)

## 2020-07-27 LAB — FOLATE: Folate: 10.7 ng/mL (ref >5.9)

## 2020-07-27 LAB — IRON: Iron: 50 ug/dL (ref 42–145)

## 2020-07-27 MED ORDER — BELSOMRA 15 MG PO TABS: 1.0000 | ORAL_TABLET | Freq: Every evening | ORAL | 1 refills | Status: DC | PRN

## 2020-07-27 NOTE — Progress Notes (Unsigned)
Subjective:  Patient ID: [name], female    DOB: [birth year]  Age: 80 y.o. MRN: [id number]  CC: Anemia, Diabetes, Hypertension, and Hyperlipidemia  This visit occurred during the SARS-CoV-2 public health emergency.  Safety protocols were in place, including screening questions prior to the visit, additional usage of staff PPE, and extensive cleaning of exam room while observing appropriate contact time as indicated for disinfecting solutions.    HPI [name] presents for f/up -  The patient says she is doing fine but her daughter complains that she does not sleep much at night.  She also has painful corns and calluses on her feet.  She has lower extremity edema and lower extremity skin changes that the dermatologist said was caused by the lack of elevation of her lower extremities.  She is active and denies any recent episodes of chest pain, shortness of breath, or palpitations.  Outpatient Medications Prior to Visit  Medication Sig Dispense Refill  . aspirin 81 MG EC tablet Take 1 tablet (81 mg total) by mouth daily. Swallow whole. 30 tablet 12  . citalopram (CELEXA) 20 MG tablet Take 1 tablet (20 mg total) by mouth daily. 30 tablet 5  . Colchicine 0.6 MG CAPS Take 1 capsule by mouth 2 (two) times a day. (Patient taking differently: Take 1 capsule by mouth as needed (for gout flare).) 60 capsule 1  . donepezil (ARICEPT) 10 MG tablet Take 10 mg by mouth in the morning.    . hydrochlorothiazide (HYDRODIURIL) 25 MG tablet Take 1 tablet (25 mg total) by mouth daily. 90 tablet 1  . irbesartan (AVAPRO) 150 MG tablet TAKE 1 TABLET(150 MG) BY MOUTH DAILY 90 tablet 1  . levocetirizine (XYZAL) 5 MG tablet TAKE 1 TABLET(5 MG) BY MOUTH EVERY EVENING 90 tablet 1  . memantine (NAMENDA) 10 MG tablet Take 10 mg by mouth 2 (two) times daily.    . Memantine HCl-Donepezil HCl (NAMZARIC) 28-10 MG CP24 Take 1 capsule at bedtime 30 capsule 11  . metFORMIN (GLUCOPHAGE) 500 MG tablet TAKE 1 TABLET(500  MG) BY MOUTH TWICE DAILY WITH A MEAL 180 tablet 1  . propranolol (INDERAL) 20 MG tablet TAKE 1 TABLET(20 MG) BY MOUTH TWICE DAILY 180 tablet 0  . simvastatin (ZOCOR) 20 MG tablet TAKE 1 TABLET(20 MG) BY MOUTH DAILY AT 6 PM 90 tablet 1   No facility-administered medications prior to visit.    ROS Review of Systems  Constitutional: Negative.  Negative for chills, diaphoresis, fatigue and fever.  HENT: Negative.  Negative for sore throat and trouble swallowing.   Eyes: Negative.   Respiratory: Negative for cough, chest tightness, shortness of breath and wheezing.   Cardiovascular: Negative for chest pain, palpitations and leg swelling.  Gastrointestinal: Negative for abdominal pain, constipation, diarrhea, nausea and vomiting.  Genitourinary: Negative.  Negative for difficulty urinating.  Musculoskeletal: Negative.  Negative for arthralgias and myalgias.  Skin: Negative for color change and pallor.  Neurological: Negative.  Negative for dizziness, light-headedness and headaches.  Hematological: Negative.  Negative for adenopathy. Does not bruise/bleed easily.  Psychiatric/Behavioral: Positive for confusion, decreased concentration and sleep disturbance. Negative for dysphoric mood. The patient is not nervous/anxious.     Objective:  BP 126/78   Pulse 65   Temp 98.1 F (36.7 C) (Oral)   Ht 5\' 2"  (1.575 m)   Wt 145 lb (65.8 kg)   SpO2 98%   BMI 26.52 kg/m   BP Readings from Last 3 Encounters:  07/27/20 126/78  06/29/20 (!) 142/86  03/25/20 140/70    Wt Readings from Last 3 Encounters:  07/27/20 145 lb (65.8 kg)  06/29/20 140 lb (63.5 kg)  03/25/20 136 lb (61.7 kg)    Physical Exam Vitals reviewed.  Constitutional:      Appearance: Normal appearance.  HENT:     Nose: Nose normal.     Mouth/Throat:     Mouth: Mucous membranes are moist.  Eyes:     General: No scleral icterus.    Conjunctiva/sclera: Conjunctivae normal.  Cardiovascular:     Rate and Rhythm: Normal  rate and regular rhythm.     Pulses:          Dorsalis pedis pulses are 1+ on the right side and 1+ on the left side.       Posterior tibial pulses are 1+ on the right side and 1+ on the left side.     Heart sounds: No murmur heard.   Pulmonary:     Effort: Pulmonary effort is normal.     Breath sounds: No stridor. No wheezing, rhonchi or rales.  Abdominal:     General: Abdomen is protuberant. Bowel sounds are normal. There is no distension.     Palpations: There is no hepatomegaly, splenomegaly or mass.  Musculoskeletal:        General: Normal range of motion.     Cervical back: Neck supple.  Feet:     Right foot:     Skin integrity: Callus present.     Toenail Condition: Right toenails are normal.     Left foot:     Skin integrity: Callus present.     Toenail Condition: Left toenails are normal.  Lymphadenopathy:     Cervical: No cervical adenopathy.  Skin:    General: Skin is warm and dry.  Neurological:     General: No focal deficit present.     Mental Status: She is alert and oriented to person, place, and time. Mental status is at baseline.  Psychiatric:        Attention and Perception: She is inattentive.        Mood and Affect: Mood and affect normal.        Speech: Speech is delayed and tangential.        Behavior: Behavior normal.        Thought Content: Thought content normal.        Cognition and Memory: Cognition is not impaired. Memory is not impaired. She does not exhibit impaired recent memory or impaired remote memory.        Judgment: Judgment normal.     Lab Results  Component Value Date   WBC 11.1 (H) 07/27/2020   HGB 11.5 (L) 07/27/2020   HCT 34.5 (L) 07/27/2020   PLT 429.0 (H) 07/27/2020   GLUCOSE 157 (H) 07/27/2020   CHOL 109 07/27/2020   TRIG 250.0 (H) 07/27/2020   HDL 32.30 (L) 07/27/2020   LDLDIRECT 62.0 07/27/2020   LDLCALC 65 08/13/2019   ALT 60 (H) 07/27/2020   AST 45 (H) 07/27/2020   NA 139 07/27/2020   K 4.0 07/27/2020   CL 100  07/27/2020   CREATININE 1.14 07/27/2020   BUN 23 07/27/2020   CO2 30 07/27/2020   TSH 2.80 07/27/2020   HGBA1C 7.1 (H) 07/27/2020   MICROALBUR 1.6 09/26/2018    DG Foot Complete Right  Result Date: 09/26/2018 CLINICAL DATA:  Right third digit pain since yesterday, atraumatic. EXAM: RIGHT FOOT COMPLETE - 3+ VIEW  COMPARISON:  None. FINDINGS: No acute fracture, dislocation, or joint erosion. There is periarticular calcification about the first metatarsal head. Patient has history of gout. Dorsal forefoot soft tissue swelling. IMPRESSION: 1. No acute osseous findings. 2. Calcification about the first MTP joint, likely gouty. Electronically Signed   By: Monte Fantasia M.D.   On: 09/26/2018 10:44    Assessment & Plan:   Elice was seen today for anemia, diabetes, hypertension and hyperlipidemia.  Diagnoses and all orders for this visit:  Controlled type 2 diabetes mellitus with diabetic neuropathy, without long-term current use of insulin ([place])- Her blood sugar is adequately well controlled. -     Hemoglobin A1c; Future -     Microalbumin / creatinine urine ratio; Future -     Ambulatory referral to Ophthalmology -     HM Diabetes Foot Exam -     Microalbumin / creatinine urine ratio -     Hemoglobin A1c  Essential hypertension- Her blood pressure is adequately well controlled. -     CBC with Differential/Platelet; Future -     Basic metabolic panel; Future -     TSH; Future -     TSH -     Basic metabolic panel -     CBC with Differential/Platelet  Stage 3b chronic kidney disease ([place])- Her renal function is stable.  She will avoid nephrotoxic agents.  Will continue to maintain control of her blood pressure and her blood sugar. -     Basic metabolic panel; Future -     Urinalysis, Routine w reflex microscopic; Future -     Microalbumin / creatinine urine ratio; Future -     Microalbumin / creatinine urine ratio -     Urinalysis, Routine w reflex microscopic -     Basic metabolic  panel  Dyslipidemia, goal LDL below 70- She has achieved her LDL goal and is doing well on the statin. -     Lipid panel; Future -     Hepatic function panel; Future -     TSH; Future -     TSH -     Hepatic function panel -     Lipid panel  Idiopathic gout, unspecified chronicity, unspecified site- She has had no recent gout attacks.  I will monitor her uric acid level. -     Uric acid; Future -     Uric acid  Deficiency anemia- I will screen her for vitamin deficiencies. -     Vitamin B12; Future -     Iron; Future -     Folate; Future -     Vitamin B1; Future -     Zinc; Future -     Ferritin; Future -     Reticulocytes; Future -     Reticulocytes -     Ferritin -     Zinc -     Vitamin B1 -     Folate -     Iron -     Vitamin B12  Insomnia, psychophysiological -     Suvorexant (BELSOMRA) 15 MG TABS; Take 1 tablet by mouth at bedtime as needed.  Corns and callus -     Ambulatory referral to Podiatry  Other orders -     LDL cholesterol, direct   I am having Ki J. Goel start on [place]. I am also having her maintain her aspirin, Colchicine, hydrochlorothiazide, simvastatin, levocetirizine, metFORMIN, irbesartan, propranolol, Namzaric, citalopram, memantine, and donepezil.  Meds ordered this encounter  Medications  . Suvorexant (BELSOMRA) 15 MG TABS    Sig: Take 1 tablet by mouth at bedtime as needed.    Dispense:  90 tablet    Refill:  1     Follow-up: Return in about 6 months (around 01/27/2021).  Scarlette Calico, MD

## 2020-07-27 NOTE — Patient Instructions (Signed)
Type 2 Diabetes Mellitus, Diagnosis, Adult Type 2 diabetes (type 2 diabetes mellitus) is a long-term, or chronic, disease. In type 2 diabetes, one or both of these problems may be present:  The pancreas does not make enough of a hormone called insulin.  Cells in the body do not respond properly to insulin that the body makes (insulin resistance). Normally, insulin allows blood sugar (glucose) to enter cells in the body. The cells use glucose for energy. Insulin resistance or lack of insulin causes excess glucose to build up in the blood instead of going into cells. This causes high blood glucose (hyperglycemia).  What are the causes? The exact cause of type 2 diabetes is not known. What increases the risk? The following factors may make you more likely to develop this condition:  Having a family member with type 2 diabetes.  Being overweight or obese.  Being inactive (sedentary).  Having been diagnosed with insulin resistance.  Having a history of prediabetes, diabetes when you were pregnant (gestational diabetes), or polycystic ovary syndrome (PCOS). What are the signs or symptoms? In the early stage of this condition, you may not have symptoms. Symptoms develop slowly and may include:  Increased thirst or hunger.  Increased urination.  Unexplained weight loss.  Tiredness (fatigue) or weakness.  Vision changes, such as blurry vision.  Dark patches on the skin. How is this diagnosed? This condition is diagnosed based on your symptoms, your medical history, a physical exam, and your blood glucose level. Your blood glucose may be checked with one or more of the following blood tests:  A fasting blood glucose (FBG) test. You will not be allowed to eat (you will fast) for 8 hours or longer before a blood sample is taken.  A random blood glucose test. This test checks blood glucose at any time of day regardless of when you ate.  An A1C (hemoglobin A1C) blood test. This test  provides information about blood glucose levels over the previous 2-3 months.  An oral glucose tolerance test (OGTT). This test measures your blood glucose at two times: ? After fasting. This is your baseline blood glucose level. ? Two hours after drinking a beverage that contains glucose. You may be diagnosed with type 2 diabetes if:  Your fasting blood glucose level is 126 mg/dL (7.0 mmol/L) or higher.  Your random blood glucose level is 200 mg/dL (11.1 mmol/L) or higher.  Your A1C level is 6.5% or higher.  Your oral glucose tolerance test result is higher than 200 mg/dL (11.1 mmol/L). These blood tests may be repeated to confirm your diagnosis.   How is this treated? Your treatment may be managed by a specialist called an endocrinologist. Type 2 diabetes may be treated by following instructions from your health care provider about:  Making dietary and lifestyle changes. These may include: ? Following a personalized nutrition plan that is developed by a registered dietitian. ? Exercising regularly. ? Finding ways to manage stress.  Checking your blood glucose level as often as told.  Taking diabetes medicines or insulin daily. This helps to keep your blood glucose levels in the healthy range.  Taking medicines to help prevent complications from diabetes. Medicines may include: ? Aspirin. ? Medicine to lower cholesterol. ? Medicine to control blood pressure. Your health care provider will set treatment goals for you. Your goals will be based on your age, other medical conditions you have, and how you respond to diabetes treatment. Generally, the goal of treatment is to maintain the   following blood glucose levels:  Before meals: 80-130 mg/dL (4.4-7.2 mmol/L).  After meals: below 180 mg/dL (10 mmol/L).  A1C level: less than 7%. Follow these instructions at home: Questions to ask your health care provider Consider asking the following questions:  Should I meet with a certified  diabetes care and education specialist?  What diabetes medicines do I need, and when should I take them?  What equipment will I need to manage my diabetes at home?  How often do I need to check my blood glucose?  Where can I find a support group for people with diabetes?  What number can I call if I have questions?  When is my next appointment? General instructions  Take over-the-counter and prescription medicines only as told by your health care provider.  Keep all follow-up visits as told by your health care provider. This is important. Where to find more information  American Diabetes Association (ADA): www.diabetes.org  American Association of Diabetes Care and Education Specialists (ADCES): www.diabeteseducator.org  International Diabetes Federation (IDF): www.idf.org Contact a health care provider if:  Your blood glucose is at or above 240 mg/dL (13.3 mmol/L) for 2 days in a row.  You have been sick or have had a fever for 2 days or longer, and you are not getting better.  You have any of the following problems for more than 6 hours: ? You cannot eat or drink. ? You have nausea and vomiting. ? You have diarrhea. Get help right away if:  You have severe hypoglycemia. This means your blood glucose is lower than 54 mg/dL (3.0 mmol/L).  You become confused or you have trouble thinking clearly.  You have difficulty breathing.  You have moderate or large ketone levels in your urine. These symptoms may represent a serious problem that is an emergency. Do not wait to see if the symptoms will go away. Get medical help right away. Call your local emergency services (911 in the [place] Do not drive yourself to the hospital. Summary  Type 2 diabetes (type 2 diabetes mellitus) is a long-term, or chronic, disease. In type 2 diabetes, the pancreas does not make enough of a hormone called insulin, or cells in the body do not respond properly to insulin that the body makes (insulin  resistance).  This condition is treated by making dietary and lifestyle changes and taking diabetes medicines or insulin.  Your health care provider will set treatment goals for you. Your goals will be based on your age, other medical conditions you have, and how you respond to diabetes treatment.  Keep all follow-up visits as told by your health care provider. This is important. This information is not intended to replace advice given to you by your health care provider. Make sure you discuss any questions you have with your health care provider. Document Revised: 12/03/2019 Document Reviewed: 12/03/2019 Elsevier Patient Education  2021 Elsevier Inc.  

## 2020-07-28 LAB — MICROALBUMIN / CREATININE URINE RATIO
Creatinine,U: 181.4 mg/dL
Microalb Creat Ratio: 2 mg/g (ref 0.0–30.0)
Microalb, Ur: 3.6 mg/dL — ABNORMAL HIGH (ref 0.0–1.9)

## 2020-07-29 LAB — URINALYSIS, ROUTINE W REFLEX MICROSCOPIC
Bilirubin Urine: NEGATIVE
Hgb urine dipstick: NEGATIVE
Nitrite: NEGATIVE
RBC / HPF: NONE SEEN (ref 0–?)
Specific Gravity, Urine: 1.025 (ref 1.000–1.030)
Total Protein, Urine: NEGATIVE
Urine Glucose: NEGATIVE
Urobilinogen, UA: 0.2 (ref 0.0–1.0)
pH: 6.5 (ref 5.0–8.0)

## 2020-07-31 LAB — VITAMIN B1: Vitamin B1 (Thiamine): 12 nmol/L (ref 8–30)

## 2020-07-31 LAB — ZINC: Zinc: 62 ug/dL (ref 60–130)

## 2020-07-31 LAB — RETICULOCYTES
ABS Retic: 64160 cells/uL (ref 20000–8000)
Retic Ct Pct: 1.6 %

## 2020-08-05 ENCOUNTER — Other Ambulatory Visit: Payer: Self-pay

## 2020-08-05 ENCOUNTER — Other Ambulatory Visit: Payer: Self-pay | Admitting: Internal Medicine

## 2020-08-05 ENCOUNTER — Ambulatory Visit (INDEPENDENT_AMBULATORY_CARE_PROVIDER_SITE_OTHER): Payer: Medicare Other | Admitting: Podiatry

## 2020-08-05 DIAGNOSIS — L84 Corns and callosities: Secondary | ICD-10-CM | POA: Diagnosis not present

## 2020-08-05 DIAGNOSIS — M2042 Other hammer toe(s) (acquired), left foot: Secondary | ICD-10-CM | POA: Diagnosis not present

## 2020-08-05 DIAGNOSIS — E1140 Type 2 diabetes mellitus with diabetic neuropathy, unspecified: Secondary | ICD-10-CM

## 2020-08-05 DIAGNOSIS — M2011 Hallux valgus (acquired), right foot: Secondary | ICD-10-CM

## 2020-08-05 DIAGNOSIS — I10 Essential (primary) hypertension: Secondary | ICD-10-CM

## 2020-08-05 DIAGNOSIS — M21611 Bunion of right foot: Secondary | ICD-10-CM | POA: Diagnosis not present

## 2020-08-09 NOTE — Progress Notes (Signed)
  Subjective:  Patient ID: [name], female    DOB: January 12, 1941,  MRN: [id number]  Chief Complaint  Patient presents with  . Callouses    Bilateral corn /calluses     80 y.o. female presents with the above complaint. History confirmed with patient.  She has tried over-the-counter corn movers without relief  Objective:  Physical Exam: warm, good capillary refill, no trophic changes or ulcerative lesions, normal DP and PT pulses and reduced protective sensation. Left Foot: Dorsal PIPJ corn fifth toe Right Foot: Plantar medial hallux callus  Assessment:   1. Controlled type 2 diabetes mellitus with diabetic neuropathy, without long-term current use of insulin ([place])   2. Hammertoe of left foot   3. Callus of foot      Plan:  Patient was evaluated and treated and all questions answered.  Patient educated on diabetes. Discussed proper diabetic foot care and discussed risks and complications of disease. Educated patient in depth on reasons to return to the office immediately should he/she discover anything concerning or new on the feet. All questions answered. Discussed proper shoes as well.   Recommended diabetic shoes  All symptomatic hyperkeratoses were safely debrided with a sterile #15 blade to patient's level of comfort without incident. We discussed preventative and palliative care of these lesions including supportive and accommodative shoegear, padding, prefabricated and custom molded accommodative orthoses, use of a pumice stone and lotions/creams daily.  Recommended urea cream  Return if symptoms worsen or fail to improve.

## 2020-08-13 ENCOUNTER — Telehealth: Payer: Self-pay | Admitting: Internal Medicine

## 2020-08-13 ENCOUNTER — Other Ambulatory Visit: Payer: Self-pay | Admitting: Internal Medicine

## 2020-08-13 DIAGNOSIS — M1000 Idiopathic gout, unspecified site: Secondary | ICD-10-CM

## 2020-08-13 MED ORDER — METHYLPREDNISOLONE 4 MG PO TBPK: ORAL_TABLET | ORAL | 0 refills | Status: AC

## 2020-08-13 NOTE — Telephone Encounter (Signed)
[id number] - [place], [place] Phone:  [phone]  Fax:  [phone]     Patients daughter calling, states the patients gout is back and she is wondering if we can send in some more prednisone. She still has some Colchicine 0.6 MG CAPS left

## 2020-08-27 ENCOUNTER — Other Ambulatory Visit: Payer: Self-pay | Admitting: Internal Medicine

## 2020-08-27 DIAGNOSIS — H6981 Other specified disorders of Eustachian tube, right ear: Secondary | ICD-10-CM

## 2020-09-01 ENCOUNTER — Other Ambulatory Visit: Payer: Self-pay | Admitting: Internal Medicine

## 2020-09-01 DIAGNOSIS — I10 Essential (primary) hypertension: Secondary | ICD-10-CM

## 2020-09-27 ENCOUNTER — Other Ambulatory Visit: Payer: Self-pay | Admitting: Internal Medicine

## 2020-09-27 DIAGNOSIS — E785 Hyperlipidemia, unspecified: Secondary | ICD-10-CM

## 2020-09-28 ENCOUNTER — Telehealth: Payer: Self-pay | Admitting: Pharmacist

## 2020-09-28 NOTE — Progress Notes (Signed)
    Chronic Care Management Pharmacy Assistant   Name: ARIONNA HOGGARD  MRN: [id number] DOB: February 14, 1941    Reason for Encounter: Disease State General Call    Recent office visits:  07/27/20 Dr. Ronnald Ramp ordered suvorexant 15 mg  Recent consult visits:  08/05/20 [facility] visits:  None in previous 6 months  Medications: Outpatient Encounter Medications as of 09/28/2020  Medication Sig Note  . aspirin 81 MG EC tablet Take 1 tablet (81 mg total) by mouth daily. Swallow whole.   . citalopram (CELEXA) 20 MG tablet Take 1 tablet (20 mg total) by mouth daily.   . Colchicine 0.6 MG CAPS Take 1 capsule by mouth 2 (two) times a day. (Patient taking differently: Take 1 capsule by mouth as needed (for gout flare).)   . donepezil (ARICEPT) 10 MG tablet Take 10 mg by mouth in the morning.   . hydrochlorothiazide (HYDRODIURIL) 25 MG tablet TAKE 1 TABLET(25 MG) BY MOUTH DAILY   . irbesartan (AVAPRO) 150 MG tablet TAKE 1 TABLET(150 MG) BY MOUTH DAILY   . levocetirizine (XYZAL) 5 MG tablet TAKE 1 TABLET(5 MG) BY MOUTH EVERY EVENING   . memantine (NAMENDA) 10 MG tablet Take 10 mg by mouth 2 (two) times daily.   . Memantine HCl-Donepezil HCl (NAMZARIC) 28-10 MG CP24 Take 1 capsule at bedtime 07/08/2020: Not approved by insurance   . metFORMIN (GLUCOPHAGE) 500 MG tablet TAKE 1 TABLET(500 MG) BY MOUTH TWICE DAILY WITH A MEAL   . propranolol (INDERAL) 20 MG tablet TAKE 1 TABLET(20 MG) BY MOUTH TWICE DAILY   . simvastatin (ZOCOR) 20 MG tablet TAKE 1 TABLET(20 MG) BY MOUTH DAILY AT 6 PM   . Suvorexant (BELSOMRA) 15 MG TABS Take 1 tablet by mouth at bedtime as needed.    No facility-administered encounter medications on file as of 09/28/2020.    Have you had any problems recently with your health? Spoke with patient daughter [name] who stated that the patient is doing well. She stated that the patient has dementia and would not respond to well  Have you had any problems with your  pharmacy? She states that the patient does not have any problems with getting her medications or the cost of the medications from the pharmacy  What issues or side effects are you having with your medications? She states that the patient is not having any side effects from medications  What would you like me to pass along to [place] [facility] for them to help you with?  She states that the patient is doing well and she does not have any concerns about her health or medications at this time  What can we do to take care of you better? She states she has not concerns at this time  Star Rating Drugs: Irbesartan 07/16/20 90 ds simvastatin 09/27/20 90 ds  [facility] Pharmacist Assistant [phone]  Time spent:25

## 2020-10-03 ENCOUNTER — Other Ambulatory Visit: Payer: Self-pay | Admitting: Internal Medicine

## 2020-10-03 DIAGNOSIS — E1140 Type 2 diabetes mellitus with diabetic neuropathy, unspecified: Secondary | ICD-10-CM

## 2020-10-08 ENCOUNTER — Other Ambulatory Visit: Payer: Self-pay | Admitting: Internal Medicine

## 2020-10-08 DIAGNOSIS — I10 Essential (primary) hypertension: Secondary | ICD-10-CM

## 2020-10-08 DIAGNOSIS — E1140 Type 2 diabetes mellitus with diabetic neuropathy, unspecified: Secondary | ICD-10-CM

## 2020-11-19 ENCOUNTER — Other Ambulatory Visit: Payer: Self-pay | Admitting: Internal Medicine

## 2020-11-19 DIAGNOSIS — I10 Essential (primary) hypertension: Secondary | ICD-10-CM

## 2020-12-14 ENCOUNTER — Telehealth: Payer: Self-pay

## 2020-12-14 NOTE — Telephone Encounter (Addendum)
Tried to reach dgt on DPR (main contact # on patient's chart). I left detailed message that our records indicate the patient is now on Namzaric. For this reason, we will decline the refill request for memantine. I ask that she call us back, if they have made medication changes since her last visit.

## 2020-12-14 NOTE — Telephone Encounter (Signed)
Attempted to call, received fax form [facility] for refill on memantine '10mg'$ , pt was perscribed Namzaric 28-'10mg'$  by Butler Denmark, NP. Waiting to verify

## 2020-12-16 ENCOUNTER — Encounter (HOSPITAL_COMMUNITY): Payer: Self-pay

## 2020-12-16 ENCOUNTER — Emergency Department (HOSPITAL_COMMUNITY): Payer: Medicare Other

## 2020-12-16 ENCOUNTER — Emergency Department (HOSPITAL_COMMUNITY)
Admission: EM | Admit: 2020-12-16 | Discharge: 2020-12-16 | Disposition: A | Discharge status: Home / Self Care | Payer: Medicare Other | Attending: Emergency Medicine | Admitting: Emergency Medicine

## 2020-12-16 ENCOUNTER — Other Ambulatory Visit: Payer: Self-pay

## 2020-12-16 DIAGNOSIS — I129 Hypertensive chronic kidney disease with stage 1 through stage 4 chronic kidney disease, or unspecified chronic kidney disease: Secondary | ICD-10-CM | POA: Diagnosis not present

## 2020-12-16 DIAGNOSIS — R0902 Hypoxemia: Secondary | ICD-10-CM | POA: Diagnosis not present

## 2020-12-16 DIAGNOSIS — Y9301 Activity, walking, marching and hiking: Secondary | ICD-10-CM | POA: Diagnosis not present

## 2020-12-16 DIAGNOSIS — Z87891 Personal history of nicotine dependence: Secondary | ICD-10-CM | POA: Insufficient documentation

## 2020-12-16 DIAGNOSIS — R58 Hemorrhage, not elsewhere classified: Secondary | ICD-10-CM | POA: Diagnosis not present

## 2020-12-16 DIAGNOSIS — Z79899 Other long term (current) drug therapy: Secondary | ICD-10-CM | POA: Diagnosis not present

## 2020-12-16 DIAGNOSIS — M47812 Spondylosis without myelopathy or radiculopathy, cervical region: Secondary | ICD-10-CM | POA: Diagnosis not present

## 2020-12-16 DIAGNOSIS — F0390 Unspecified dementia without behavioral disturbance: Secondary | ICD-10-CM | POA: Insufficient documentation

## 2020-12-16 DIAGNOSIS — I517 Cardiomegaly: Secondary | ICD-10-CM | POA: Diagnosis not present

## 2020-12-16 DIAGNOSIS — Z7982 Long term (current) use of aspirin: Secondary | ICD-10-CM | POA: Diagnosis not present

## 2020-12-16 DIAGNOSIS — S0990XA Unspecified injury of head, initial encounter: Secondary | ICD-10-CM

## 2020-12-16 DIAGNOSIS — M542 Cervicalgia: Secondary | ICD-10-CM | POA: Diagnosis not present

## 2020-12-16 DIAGNOSIS — R404 Transient alteration of awareness: Secondary | ICD-10-CM | POA: Diagnosis not present

## 2020-12-16 DIAGNOSIS — Y9289 Other specified places as the place of occurrence of the external cause: Secondary | ICD-10-CM | POA: Insufficient documentation

## 2020-12-16 DIAGNOSIS — S199XXA Unspecified injury of neck, initial encounter: Secondary | ICD-10-CM | POA: Diagnosis not present

## 2020-12-16 DIAGNOSIS — S0101XA Laceration without foreign body of scalp, initial encounter: Secondary | ICD-10-CM | POA: Diagnosis not present

## 2020-12-16 DIAGNOSIS — M4312 Spondylolisthesis, cervical region: Secondary | ICD-10-CM | POA: Diagnosis not present

## 2020-12-16 DIAGNOSIS — W19XXXA Unspecified fall, initial encounter: Secondary | ICD-10-CM

## 2020-12-16 DIAGNOSIS — Z7984 Long term (current) use of oral hypoglycemic drugs: Secondary | ICD-10-CM | POA: Insufficient documentation

## 2020-12-16 DIAGNOSIS — W01198A Fall on same level from slipping, tripping and stumbling with subsequent striking against other object, initial encounter: Secondary | ICD-10-CM | POA: Insufficient documentation

## 2020-12-16 DIAGNOSIS — N183 Chronic kidney disease, stage 3 unspecified: Secondary | ICD-10-CM | POA: Insufficient documentation

## 2020-12-16 DIAGNOSIS — I10 Essential (primary) hypertension: Secondary | ICD-10-CM | POA: Diagnosis not present

## 2020-12-16 DIAGNOSIS — E1149 Type 2 diabetes mellitus with other diabetic neurological complication: Secondary | ICD-10-CM | POA: Insufficient documentation

## 2020-12-16 LAB — CBC WITH DIFFERENTIAL/PLATELET
Abs Immature Granulocytes: 0.03 10*3/uL (ref 0.00–0.07)
Basophils Absolute: 0 10*3/uL (ref 0.0–0.1)
Basophils Relative: 0 %
Eosinophils Absolute: 0.6 10*3/uL — ABNORMAL HIGH (ref 0.0–0.5)
Eosinophils Relative: 6 %
HCT: 35 % — ABNORMAL LOW (ref 36.0–46.0)
Hemoglobin: 10.7 g/dL — ABNORMAL LOW (ref 12.0–15.0)
Immature Granulocytes: 0 %
Lymphocytes Relative: 17 %
Lymphs Abs: 1.8 10*3/uL (ref 0.7–4.0)
MCH: 27.5 pg (ref 26.0–34.0)
MCHC: 30.6 g/dL (ref 30.0–36.0)
MCV: 90 fL (ref 80.0–100.0)
Monocytes Absolute: 0.8 10*3/uL (ref 0.1–1.0)
Monocytes Relative: 8 %
Neutro Abs: 7.2 10*3/uL (ref 1.7–7.7)
Neutrophils Relative %: 69 %
Platelets: 426 10*3/uL — ABNORMAL HIGH (ref 150–400)
RBC: 3.89 MIL/uL (ref 3.87–5.11)
RDW: 15.9 % — ABNORMAL HIGH (ref 11.5–15.5)
WBC: 10.5 10*3/uL (ref 4.0–10.5)
nRBC: 0 % (ref 0.0–0.2)

## 2020-12-16 LAB — COMPREHENSIVE METABOLIC PANEL
ALT: 57 U/L — ABNORMAL HIGH (ref 0–44)
AST: 59 U/L — ABNORMAL HIGH (ref 15–41)
Albumin: 3.3 g/dL — ABNORMAL LOW (ref 3.5–5.0)
Alkaline Phosphatase: 335 U/L — ABNORMAL HIGH (ref 38–126)
Anion gap: 10 (ref 5–15)
BUN: 19 mg/dL (ref 8–23)
CO2: 27 mmol/L (ref 22–32)
Calcium: 9.2 mg/dL (ref 8.9–10.3)
Chloride: 101 mmol/L (ref 98–111)
Creatinine, Ser: 1.26 mg/dL — ABNORMAL HIGH (ref 0.44–1.00)
GFR, Estimated: 43 mL/min — ABNORMAL LOW (ref >60)
Glucose, Bld: 140 mg/dL — ABNORMAL HIGH (ref 70–99)
Potassium: 3.5 mmol/L (ref 3.5–5.1)
Sodium: 138 mmol/L (ref 135–145)
Total Bilirubin: 0.8 mg/dL (ref 0.3–1.2)
Total Protein: 7.1 g/dL (ref 6.5–8.1)

## 2020-12-16 MED ORDER — LIDOCAINE-EPINEPHRINE (PF) 2 %-1:200000 IJ SOLN
10.0000 mL | Freq: Once | INTRAMUSCULAR | Status: AC
  Administered 2020-12-16: 10 mL
  Filled 2020-12-16: qty 20

## 2020-12-16 MED ORDER — ACETAMINOPHEN 325 MG PO TABS
650.0000 mg | ORAL_TABLET | Freq: Once | ORAL | Status: AC
  Administered 2020-12-16: 650 mg via ORAL
  Filled 2020-12-16: qty 2

## 2020-12-16 NOTE — ED Notes (Signed)
Received verbal report from Gloria G RN at this time 

## 2020-12-16 NOTE — ED Notes (Signed)
Educated pt on [place] and how to use it with family at bedside at this time

## 2020-12-16 NOTE — ED Notes (Signed)
Provider at bedside at this time

## 2020-12-16 NOTE — ED Notes (Signed)
Provider at bedside

## 2020-12-16 NOTE — ED Triage Notes (Signed)
Pt arrived to ED via EMS after a witnessed mechanical fall walking outside of [employer]. Pt lost balance and hit the back of her head. Hematoma noted to occipital region w/ bleeding controlled per EMS. Pt had dementia and is at baseline cognitively per family. C-collar in place. VSS w/ EMS CBG 168.

## 2020-12-16 NOTE — Discharge Instructions (Addendum)
[name] was brought to the emergency department today due to a fall.  The x-ray of her head and neck showed no acute abnormalities.  The laceration on the back of her head required staples.  She will need to keep the area dry for the next 24 hours.  After that she may gently clean the area with soap and water.  Please do not submerge the wound under water until this staples are removed.  Please see your primary care provider in 7 days to have staples removed.    While in the emergency department lab work showed that patient had a increase in her liver function and kidney function.  Patient has elevated levels at baseline.  Follow-up with your primary care provider for repeat testing.

## 2020-12-16 NOTE — ED Notes (Signed)
Per pt she has urinated however none is noted in suction container. Messaged provider reference in and out cath and if pt was being admitted or not

## 2020-12-16 NOTE — ED Provider Notes (Signed)
[facility] EMERGENCY DEPARTMENT Provider Note   CSN: [id number] Arrival date & time: 12/16/20  1552     History Chief Complaint  Patient presents with   Fall   Head Injury    [name] is a 80 y.o. female with a history of dementia, type 2 diabetes, hypertension, CKD.  Presents to the emergency department with a chief complaint of mechanical fall and head injury.  EMS reports that patient suffered mechanical fall walking outside of [employer], fall was witnessed by her daughter.  Patient struck the back of her head.  Patient is at baseline mental status per family.  Patient is not on any blood thinners.  Patient has had increased falls over the last few weeks.  Patient is alert to person only.  Level 5 caveat applies.   Fall  Head Injury     Past Medical History:  Diagnosis Date   Contact dermatitis and other eczema, due to unspecified cause    Displacement of lumbar intervertebral disc without myelopathy    FH: cataracts    Gout, unspecified    Memory loss    Occlusion and stenosis of carotid artery without mention of cerebral infarction    Other and unspecified hyperlipidemia    Pain in joint, shoulder region    Pain in limb    Routine general medical examination at a health care facility    Sciatica    Type II or unspecified type diabetes mellitus without mention of complication, uncontrolled    Unspecified essential hypertension    Unspecified venous (peripheral) insufficiency     Patient Active Problem List   Diagnosis Date Noted   Deficiency anemia 07/27/2020   Insomnia, 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   Corns and callus 07/27/2020   Chronic renal disease, stage 3, moderately decreased glomerular filtration rate (GFR) between 30-59 mL/min/1.73 square meter ([place]) 09/26/2018   Senile dementia without behavioral disturbance ([place]) 03/21/2017   Atherosclerosis of native artery of both lower extremities with intermittent claudication ([place])  11/24/2015   Cervical stenosis of spinal canal 12/22/2014   Visit for screening mammogram 09/16/2014   Routine health maintenance 01/03/2011   Peripheral neuropathy ([place]) 08/23/2010    Class: Diagnosis of   DM (diabetes mellitus) type II controlled, neurological manifestation ([place]) 07/21/2010   Carotid artery stenosis 12/15/2009   DEGENERATIVE [place] DISEASE, LUMBOSACRAL SPINE W/RADICULOPATHY 12/29/2008   Dyslipidemia, goal LDL below 70 02/16/2007   Gout 02/16/2007   Essential hypertension 02/16/2007    Past Surgical History:  Procedure Laterality Date   ANTERIOR CERVICAL DECOMP/DISCECTOMY FUSION N/A 12/22/2014   Procedure: Anterior Cervical Four-Five/Five-Six/Six-Seven Decompression/Diskectomy/ and Fsion;  Surgeon: Leeroy Cha, MD;  Location: [facility];  Service: Neurosurgery;  Laterality: N/A;  C4-5 C5-6 C6-7 Anterior cervical decompression/diskectomy/fusion   LAPAROSCOPIC APPENDECTOMY     LUMBAR LAMINECTOMY  07/2008   [place]   TUBAL LIGATION       OB History   No obstetric history on file.     Family History  Problem Relation Age of Onset   Alzheimer's disease Mother    Diabetes Mother    Coronary artery disease Father        CABG   Hypertension Father    Cancer Brother    Cancer Brother    Colon cancer Neg Hx    Breast cancer Neg Hx     Social History   Tobacco Use   Smoking status: Former    Types: Cigarettes    Quit date: 05/23/1995  Years since quitting: 25.5   Smokeless tobacco: Never  Substance Use Topics   Alcohol use: No   Drug use: No    Home Medications Prior to Admission medications   Medication Sig Start Date End Date Taking? Authorizing Provider  aspirin 81 MG EC tablet Take 1 tablet (81 mg total) by mouth daily. Swallow whole. 04/21/15   Janith Lima, MD  citalopram (CELEXA) 20 MG tablet Take 1 tablet (20 mg total) by mouth daily. 06/29/20   Suzzanne Cloud, NP  Colchicine 0.6 MG CAPS Take 1 capsule by mouth 2 (two) times a day. Patient  taking differently: Take 1 capsule by mouth as needed (for gout flare). 09/26/18   Janith Lima, MD  donepezil (ARICEPT) 10 MG tablet Take 10 mg by mouth in the morning.    [provider]  hydrochlorothiazide (HYDRODIURIL) 25 MG tablet TAKE 1 TABLET(25 MG) BY MOUTH DAILY 09/01/20   Janith Lima, MD  irbesartan (AVAPRO) 150 MG tablet TAKE 1 TABLET(150 MG) BY MOUTH DAILY 10/08/20   Janith Lima, MD  levocetirizine (XYZAL) 5 MG tablet TAKE 1 TABLET(5 MG) BY MOUTH EVERY EVENING 08/27/20   Janith Lima, MD  memantine (NAMENDA) 10 MG tablet Take 10 mg by mouth 2 (two) times daily.    [provider]  Memantine HCl-Donepezil HCl [facility]) 28-10 MG CP24 Take 1 capsule at bedtime 06/29/20   Suzzanne Cloud, NP  metFORMIN (GLUCOPHAGE) 500 MG tablet TAKE 1 TABLET(500 MG) BY MOUTH TWICE DAILY WITH A MEAL 10/03/20   Janith Lima, MD  propranolol (INDERAL) 20 MG tablet TAKE 1 TABLET(20 MG) BY MOUTH TWICE DAILY 11/19/20   Janith Lima, MD  simvastatin (ZOCOR) 20 MG tablet TAKE 1 TABLET(20 MG) BY MOUTH DAILY AT 6 PM 09/27/20   Janith Lima, MD  Suvorexant (BELSOMRA) 15 MG TABS Take 1 tablet by mouth at bedtime as needed. 07/27/20   Janith Lima, MD    Allergies    Pneumococcal vaccines, Ampicillin, Neomycin-bacitracin zn-polymyx, and Penicillins  Review of Systems   Review of Systems  Unable to perform ROS: Dementia   Physical Exam Updated Vital Signs BP (!) 180/66 (BP Location: Right Arm)   Pulse 76   Temp 98.1 F (36.7 C) (Oral)   Resp 18   Ht '5\' 2"'  (1.575 m)   Wt 65.8 kg   SpO2 96%   BMI 26.53 kg/m   Physical Exam Vitals and nursing note reviewed.  Constitutional:      General: She is not in acute distress.    Appearance: She is not ill-appearing, toxic-appearing or diaphoretic.     Interventions: Cervical collar in place.  HENT:     Head: Normocephalic. Contusion and laceration present. No raccoon eyes, abrasion, masses, right periorbital erythema or left  periorbital erythema.     Jaw: No trismus, tenderness or pain on movement.      Comments: No swelling, tenderness, or deformity to facial bones.  Tenderness to contusion and around laceration site.  Centimeter laceration, photograph below.    Mouth/Throat:     Mouth: No injury, lacerations or angioedema.     Pharynx: Oropharynx is clear. Uvula midline. No pharyngeal swelling, oropharyngeal exudate, posterior oropharyngeal erythema or uvula swelling.  Eyes:     General: No scleral icterus.       Right eye: No discharge.        Left eye: No discharge.     Extraocular Movements: Extraocular movements intact.  Conjunctiva/sclera: Conjunctivae normal.     Pupils: Pupils are equal, round, and reactive to light.  Cardiovascular:     Rate and Rhythm: Normal rate.  Pulmonary:     Effort: Pulmonary effort is normal. No tachypnea, bradypnea or respiratory distress.     Breath sounds: Normal breath sounds. No stridor.     Comments: Patient speaks in full complete sentences without difficulty Chest:     Chest wall: No deformity, tenderness or crepitus.  Abdominal:     General: There is no distension. There are no signs of injury.     Palpations: Abdomen is soft. There is no mass or pulsatile mass.     Tenderness: There is no abdominal tenderness. There is no guarding or rebound.     Hernia: There is no hernia in the umbilical area or ventral area.  Musculoskeletal:     Cervical back: Normal range of motion and neck supple. No edema, erythema, signs of trauma, rigidity, torticollis or crepitus. No pain with movement, spinous process tenderness or muscular tenderness. Normal range of motion.     Comments: Chronic venous stasis changes noted to bilateral lower extremities  Skin:    General: Skin is warm and dry.  Neurological:     General: No focal deficit present.     Mental Status: She is alert.     GCS: GCS eye subscore is 4. GCS verbal subscore is 5. GCS motor subscore is 6.     Cranial  Nerves: No cranial nerve deficit, dysarthria or facial asymmetry.     Sensory: Sensation is intact.     Motor: No weakness, tremor, seizure activity or pronator drift.     Coordination: Finger-Nose-Finger Test normal.     Comments: CN II-XII intact; performed in supine position due to fall, +5 strength to bilateral upper extremities, +5 strength to dorsiflexion and plantarflexion, patient able to left both legs against gravity and hold each there without difficulty, sensation to light touch intact to bilateral upper and lower extremities.  Patient alert to person only  Psychiatric:        Behavior: Behavior is cooperative.      ED Results / Procedures / Treatments   Labs (all labs ordered are listed, but only abnormal results are displayed) Labs Reviewed  COMPREHENSIVE METABOLIC PANEL - Abnormal; Notable for the following components:      Result Value   Glucose, Bld 140 (*)    Creatinine, Ser 1.26 (*)    Albumin 3.3 (*)    AST 59 (*)    ALT 57 (*)    Alkaline Phosphatase 335 (*)    GFR, Estimated 43 (*)    All other components within normal limits  CBC WITH DIFFERENTIAL/PLATELET - Abnormal; Notable for the following components:   Hemoglobin 10.7 (*)    HCT 35.0 (*)    RDW 15.9 (*)    Platelets 426 (*)    Eosinophils Absolute 0.6 (*)    All other components within normal limits  URINE CULTURE  URINALYSIS, ROUTINE W REFLEX MICROSCOPIC    EKG None  Radiology CT Head Wo Contrast  Result Date: 12/16/2020 CLINICAL DATA:  80 year old female with head trauma. EXAM: CT HEAD WITHOUT CONTRAST CT CERVICAL SPINE WITHOUT CONTRAST TECHNIQUE: Multidetector CT imaging of the head and cervical spine was performed following the standard protocol without intravenous contrast. Multiplanar CT image reconstructions of the cervical spine were also generated. COMPARISON:  Head CT dated 03/31/2006. FINDINGS: CT HEAD FINDINGS Brain: Mild age-related atrophy and chronic  microvascular ischemic  changes. There is no acute intracranial hemorrhage. No mass effect or midline shift. No extra-axial fluid collection. Vascular: No hyperdense vessel or unexpected calcification. Skull: Normal. Negative for fracture or focal lesion. Sinuses/Orbits: No acute finding. Other: Left posterior parietal/occipital scalp laceration and small contusion. CT CERVICAL SPINE FINDINGS Alignment: No acute subluxation. There is grade 1 C3-C4 anterolisthesis. Skull base and vertebrae: No acute fracture. Osteopenia. Soft tissues and spinal canal: No prevertebral fluid or swelling. No visible canal hematoma. Disc levels:  C4-C7 ACDF and disc spacer. Upper chest: Negative. Other: Bilateral carotid bulb calcified plaques. IMPRESSION: 1. No acute intracranial hemorrhage. Mild age-related atrophy and chronic microvascular ischemic changes. 2. No acute/traumatic cervical spine pathology. 3. Degenerative changes and C4-C7 ACDF and disc spacer. Electronically Signed   By: Anner Crete M.D.   On: 12/16/2020 17:10   CT Cervical Spine Wo Contrast  Result Date: 12/16/2020 CLINICAL DATA:  80 year old female with head trauma. EXAM: CT HEAD WITHOUT CONTRAST CT CERVICAL SPINE WITHOUT CONTRAST TECHNIQUE: Multidetector CT imaging of the head and cervical spine was performed following the standard protocol without intravenous contrast. Multiplanar CT image reconstructions of the cervical spine were also generated. COMPARISON:  Head CT dated 03/31/2006. FINDINGS: CT HEAD FINDINGS Brain: Mild age-related atrophy and chronic microvascular ischemic changes. There is no acute intracranial hemorrhage. No mass effect or midline shift. No extra-axial fluid collection. Vascular: No hyperdense vessel or unexpected calcification. Skull: Normal. Negative for fracture or focal lesion. Sinuses/Orbits: No acute finding. Other: Left posterior parietal/occipital scalp laceration and small contusion. CT CERVICAL SPINE FINDINGS Alignment: No acute subluxation.  There is grade 1 C3-C4 anterolisthesis. Skull base and vertebrae: No acute fracture. Osteopenia. Soft tissues and spinal canal: No prevertebral fluid or swelling. No visible canal hematoma. Disc levels:  C4-C7 ACDF and disc spacer. Upper chest: Negative. Other: Bilateral carotid bulb calcified plaques. IMPRESSION: 1. No acute intracranial hemorrhage. Mild age-related atrophy and chronic microvascular ischemic changes. 2. No acute/traumatic cervical spine pathology. 3. Degenerative changes and C4-C7 ACDF and disc spacer. Electronically Signed   By: Anner Crete M.D.   On: 12/16/2020 17:10   DG Chest Port 1 View  Result Date: 12/16/2020 CLINICAL DATA:  Fall. EXAM: PORTABLE CHEST 1 VIEW COMPARISON:  May 20, 2013. FINDINGS: Stable cardiomegaly. No pneumothorax or pleural effusion is noted. Mildly increased diffuse interstitial densities are noted concerning for possible edema or atypical inflammation. The visualized skeletal structures are unremarkable. IMPRESSION: Stable cardiomegaly. Mildly increased diffuse interstitial densities concerning for possible edema or atypical inflammation. Aortic Atherosclerosis (ICD10-I70.0). Electronically Signed   By: Marijo Conception M.D.   On: 12/16/2020 16:28    Procedures .Marland KitchenLaceration Repair  Date/Time: 12/16/2020 9:28 PM Performed by: Loni Beckwith, PA-C Authorized by: Loni Beckwith, PA-C   Consent:    Consent obtained:  Emergent situation   Risks discussed:  Infection, need for additional repair, pain, poor cosmetic result and poor wound healing   Alternatives discussed:  No treatment and delayed treatment Universal protocol:    Procedure explained and questions answered to patient or proxy's satisfaction: yes     Immediately prior to procedure, a time out was called: yes     Patient identity confirmed:  Verbally with patient and arm band Anesthesia:    Anesthesia method:  Local infiltration   Local anesthetic:  Lidocaine 2% WITH  epi Laceration details:    Location:  Scalp   Scalp location:  Occipital   Length (cm):  3   Depth (mm):  10 Pre-procedure details:    Preparation:  Patient was prepped and draped in usual sterile fashion Exploration:    Wound exploration: entire depth of wound visualized     Wound extent: no foreign bodies/material noted   Treatment:    Area cleansed with:  Povidone-iodine   Amount of cleaning:  Standard   Irrigation solution:  Sterile saline Skin repair:    Repair method:  Sutures and staples   Suture size:  3-0   Wound skin closure material used: Vicryl.   Number of sutures:  1   Number of staples:  3 Approximation:    Approximation:  Close Repair type:    Repair type:  Simple Post-procedure details:    Procedure completion:  Tolerated well, no immediate complications   Medications Ordered in ED Medications  acetaminophen (TYLENOL) tablet 650 mg (has no administration in time range)    ED Course  I have reviewed the triage vital signs and the nursing notes.  Pertinent labs & imaging results that were available during my care of the patient were reviewed by me and considered in my medical decision making (see chart for details).    MDM Rules/Calculators/A&P                           Alert 80 year old female no acute distress, nontoxic appearing.  Presents emergency department after suffering a mechanical fall.  Fall was witnessed by daughter.  Patient complains of pain to left occipital head.  Patient is alert to person only, history of dementia, per family is at baseline.  No focal neurological deficits on exam.  No midline tenderness or deformity to thoracic or lumbar spine.  Patient immobilized with a c-collar.  No deformity, tenderness, bony tenderness to bilateral upper or lower extremities.  Pelvis stable.  Abdomen soft, nondistended, nontender.  No tenderness, crepitus, or deformity to patient's chest.  Tetanus shot last received in 2019.  Will obtain  noncontrast head CT, noncontrast cervical spine CT, chest x-ray to evaluate for injuries after patient's fall.  Patient's recent increase in falls will obtain EKG, basic lab work, and urinalysis.  Noncontrast head CT shows no acute intracranial abnormality.  No acute/traumatic cervical spine pathology.  Chest x-ray chest x-ray shows stable cardiomegaly, mild increased diffuse interstitial densities concerning for possible edema or atypical inflammation.  CMP shows creatinine elevated at 1.26 slightly elevated from patient's baseline.  Patient does have history of CKD.  AST, ALT, alk phos all elevated.  Appears to be slightly elevated from patient's baseline.  Laceration repair as noted above.  Delay getting patient's urine sample.  Discussed with patient's daughter at bedside reports that they will follow-up with primary care provider and have urinalysis done in outpatient setting.  Advised patient's noted to have primary care provider follow-up due to slightly elevated lab results.  Patient's daughter given strict return precautions.  Expressed understanding of all instructions and is agreeable with this plan.  Final Clinical Impression(s) / ED Diagnoses Final diagnoses:  Injury of head, initial encounter  Laceration of scalp, initial encounter    Rx / DC Orders ED Discharge Orders     None        Loni Beckwith, PA-C 12/17/20 0006    Lorelle Gibbs, DO 12/17/20 [id number]

## 2020-12-23 ENCOUNTER — Ambulatory Visit (INDEPENDENT_AMBULATORY_CARE_PROVIDER_SITE_OTHER): Payer: Medicare Other | Admitting: Internal Medicine

## 2020-12-23 ENCOUNTER — Encounter: Payer: Self-pay | Admitting: Internal Medicine

## 2020-12-23 ENCOUNTER — Other Ambulatory Visit: Payer: Self-pay

## 2020-12-23 VITALS — BP 138/76 | HR 60 | Temp 98.2°F | Resp 16 | Ht 62.0 in | Wt 137.0 lb

## 2020-12-23 DIAGNOSIS — D539 Nutritional anemia, unspecified: Secondary | ICD-10-CM | POA: Diagnosis not present

## 2020-12-23 DIAGNOSIS — E1140 Type 2 diabetes mellitus with diabetic neuropathy, unspecified: Secondary | ICD-10-CM | POA: Diagnosis not present

## 2020-12-23 DIAGNOSIS — N1832 Chronic kidney disease, stage 3b: Secondary | ICD-10-CM

## 2020-12-23 DIAGNOSIS — R945 Abnormal results of liver function studies: Secondary | ICD-10-CM | POA: Insufficient documentation

## 2020-12-23 DIAGNOSIS — I10 Essential (primary) hypertension: Secondary | ICD-10-CM | POA: Diagnosis not present

## 2020-12-23 DIAGNOSIS — R7989 Other specified abnormal findings of blood chemistry: Secondary | ICD-10-CM | POA: Insufficient documentation

## 2020-12-23 LAB — CBC WITH DIFFERENTIAL/PLATELET
Basophils Absolute: 0.1 10*3/uL (ref 0.0–0.1)
Basophils Relative: 0.6 % (ref 0.0–3.0)
Eosinophils Absolute: 0.6 10*3/uL (ref 0.0–0.7)
Eosinophils Relative: 7.2 % — ABNORMAL HIGH (ref 0.0–5.0)
HCT: 32.8 % — ABNORMAL LOW (ref 36.0–46.0)
Hemoglobin: 10.4 g/dL — ABNORMAL LOW (ref 12.0–15.0)
Lymphocytes Relative: 24.2 % (ref 12.0–46.0)
Lymphs Abs: 2.1 10*3/uL (ref 0.7–4.0)
MCHC: 31.7 g/dL (ref 30.0–36.0)
MCV: 86.5 fl (ref 78.0–100.0)
Monocytes Absolute: 0.9 10*3/uL (ref 0.1–1.0)
Monocytes Relative: 9.9 % (ref 3.0–12.0)
Neutro Abs: 5.1 10*3/uL (ref 1.4–7.7)
Neutrophils Relative %: 58.1 % (ref 43.0–77.0)
Platelets: 456 10*3/uL — ABNORMAL HIGH (ref 150.0–400.0)
RBC: 3.8 Mil/uL — ABNORMAL LOW (ref 3.87–5.11)
RDW: 16.1 % — ABNORMAL HIGH (ref 11.5–15.5)
WBC: 8.8 10*3/uL (ref 4.0–10.5)

## 2020-12-23 LAB — BASIC METABOLIC PANEL
BUN: 26 mg/dL — ABNORMAL HIGH (ref 6–23)
CO2: 28 mEq/L (ref 19–32)
Calcium: 9.5 mg/dL (ref 8.4–10.5)
Chloride: 102 mEq/L (ref 96–112)
Creatinine, Ser: 1.19 mg/dL (ref 0.40–1.20)
GFR: 42.99 mL/min — ABNORMAL LOW (ref >60.00)
Glucose, Bld: 82 mg/dL (ref 70–99)
Potassium: 4.5 mEq/L (ref 3.5–5.1)
Sodium: 138 mEq/L (ref 135–145)

## 2020-12-23 LAB — PROTIME-INR
INR: 1.1 ratio — ABNORMAL HIGH (ref 0.8–1.0)
Prothrombin Time: 11.8 s (ref 9.6–13.1)

## 2020-12-23 LAB — HEPATIC FUNCTION PANEL
ALT: 50 U/L — ABNORMAL HIGH (ref 0–35)
AST: 53 U/L — ABNORMAL HIGH (ref 0–37)
Albumin: 3.7 g/dL (ref 3.5–5.2)
Alkaline Phosphatase: 379 U/L — ABNORMAL HIGH (ref 39–117)
Bilirubin, Direct: 0.2 mg/dL (ref 0.0–0.3)
Total Bilirubin: 0.8 mg/dL (ref 0.2–1.2)
Total Protein: 7.7 g/dL (ref 6.0–8.3)

## 2020-12-23 LAB — HEMOGLOBIN A1C: Hgb A1c MFr Bld: 6.2 % (ref 4.6–6.5)

## 2020-12-23 NOTE — Progress Notes (Signed)
Subjective:  Patient ID: [name], female    DOB: 1941/02/13  Age: 80 y.o. MRN: [id number]  CC: Anemia  This visit occurred during the SARS-CoV-2 public health emergency.  Safety protocols were in place, including screening questions prior to the visit, additional usage of staff PPE, and extensive cleaning of exam room while observing appropriate contact time as indicated for disinfecting solutions.    HPI [name] presents for f/up -   She recently sustained a fall and had staples placed in her left posterior scalp.  She has a mild headache that is improving.  Her daughter complains that she continues to have a neurological decline.  She had some abnormal labs in the ED that the daughter wants to have rechecked.  Outpatient Medications Prior to Visit  Medication Sig Dispense Refill   aspirin 81 MG EC tablet Take 1 tablet (81 mg total) by mouth daily. Swallow whole. 30 tablet 12   citalopram (CELEXA) 20 MG tablet Take 1 tablet (20 mg total) by mouth daily. 30 tablet 5   donepezil (ARICEPT) 10 MG tablet Take 10 mg by mouth in the morning.     hydrochlorothiazide (HYDRODIURIL) 25 MG tablet TAKE 1 TABLET(25 MG) BY MOUTH DAILY (Patient taking differently: Take 25 mg by mouth in the morning.) 90 tablet 1   levocetirizine (XYZAL) 5 MG tablet TAKE 1 TABLET(5 MG) BY MOUTH EVERY EVENING (Patient taking differently: Take 5 mg by mouth every evening.) 90 tablet 1   memantine (NAMENDA) 10 MG tablet Take 10 mg by mouth in the morning and at bedtime.     Memantine HCl-Donepezil HCl (NAMZARIC) 28-10 MG CP24 Take 1 capsule at bedtime 30 capsule 11   metFORMIN (GLUCOPHAGE) 500 MG tablet TAKE 1 TABLET(500 MG) BY MOUTH TWICE DAILY WITH A MEAL (Patient taking differently: Take 500 mg by mouth 2 (two) times daily with a meal.) 180 tablet 1   propranolol (INDERAL) 20 MG tablet TAKE 1 TABLET(20 MG) BY MOUTH TWICE DAILY (Patient taking differently: Take 20 mg by mouth 2 (two) times daily.) 180 tablet 0    simvastatin (ZOCOR) 20 MG tablet TAKE 1 TABLET(20 MG) BY MOUTH DAILY AT 6 PM (Patient taking differently: Take 20 mg by mouth every evening.) 90 tablet 1   Colchicine 0.6 MG CAPS Take 1 capsule by mouth 2 (two) times a day. (Patient taking differently: No sig reported) 60 capsule 1   irbesartan (AVAPRO) 150 MG tablet TAKE 1 TABLET(150 MG) BY MOUTH DAILY (Patient taking differently: No sig reported) 90 tablet 1   Suvorexant (BELSOMRA) 15 MG TABS Take 1 tablet by mouth at bedtime as needed. (Patient taking differently: Take 15 mg by mouth at bedtime.) 90 tablet 1   No facility-administered medications prior to visit.    ROS Review of Systems  Constitutional:  Positive for fatigue. Negative for diaphoresis.  HENT: Negative.    Eyes: Negative.   Cardiovascular:  Negative for chest pain, palpitations and leg swelling.  Gastrointestinal:  Negative for abdominal pain, diarrhea, nausea and vomiting.  Endocrine: Negative.   Genitourinary: Negative.  Negative for difficulty urinating.  Musculoskeletal:  Positive for gait problem. Negative for myalgias.  Skin: Negative.   Neurological:  Positive for headaches. Negative for dizziness, weakness and light-headedness.  Hematological:  Negative for adenopathy. Does not bruise/bleed easily.  Psychiatric/Behavioral:  Positive for confusion and decreased concentration.    Objective:  BP 138/76 (BP Location: Left Arm, Patient Position: Sitting, Cuff Size: Large)   Pulse 60  Temp 98.2 F (36.8 C) (Oral)   Resp 16   Ht '5\' 2"'$  (1.575 m)   Wt 137 lb (62.1 kg)   SpO2 98%   BMI 25.06 kg/m   BP Readings from Last 3 Encounters:  12/23/20 138/76  12/16/20 (!) 172/65  07/27/20 126/78    Wt Readings from Last 3 Encounters:  12/23/20 137 lb (62.1 kg)  12/16/20 145 lb 1 oz (65.8 kg)  07/27/20 145 lb (65.8 kg)    Physical Exam Vitals reviewed.  Constitutional:      Appearance: She is not ill-appearing.  HENT:     Head: Abrasion and contusion  present. No raccoon eyes or Battle's sign.      Nose: Nose normal.     Mouth/Throat:     Mouth: Mucous membranes are moist.  Eyes:     Conjunctiva/sclera: Conjunctivae normal.  Cardiovascular:     Rate and Rhythm: Normal rate and regular rhythm.     Heart sounds: No murmur heard. Pulmonary:     Effort: Pulmonary effort is normal.     Breath sounds: No stridor. No wheezing, rhonchi or rales.  Abdominal:     General: Abdomen is flat.     Palpations: There is no mass.     Tenderness: There is no abdominal tenderness. There is no guarding.     Hernia: No hernia is present.  Musculoskeletal:        General: Normal range of motion.     Right lower leg: No edema.     Left lower leg: No edema.  Lymphadenopathy:     Cervical: No cervical adenopathy.  Skin:    General: Skin is warm and dry.  Neurological:     Mental Status: She is alert. Mental status is at baseline.     Gait: Gait abnormal.  Psychiatric:        Mood and Affect: Mood normal.    Lab Results  Component Value Date   WBC 8.8 12/23/2020   HGB 10.4 (L) 12/23/2020   HCT 32.8 (L) 12/23/2020   PLT 456.0 (H) 12/23/2020   GLUCOSE 82 12/23/2020   CHOL 109 07/27/2020   TRIG 250.0 (H) 07/27/2020   HDL 32.30 (L) 07/27/2020   LDLDIRECT 62.0 07/27/2020   LDLCALC 65 08/13/2019   ALT 50 (H) 12/23/2020   AST 53 (H) 12/23/2020   NA 138 12/23/2020   K 4.5 12/23/2020   CL 102 12/23/2020   CREATININE 1.19 12/23/2020   BUN 26 (H) 12/23/2020   CO2 28 12/23/2020   TSH 2.80 07/27/2020   INR 1.1 (H) 12/23/2020   HGBA1C 6.2 12/23/2020   MICROALBUR 3.6 (H) 07/27/2020    CT Head Wo Contrast  Result Date: 12/16/2020 CLINICAL DATA:  80 year old female with head trauma. EXAM: CT HEAD WITHOUT CONTRAST CT CERVICAL SPINE WITHOUT CONTRAST TECHNIQUE: Multidetector CT imaging of the head and cervical spine was performed following the standard protocol without intravenous contrast. Multiplanar CT image reconstructions of the cervical spine  were also generated. COMPARISON:  Head CT dated 03/31/2006. FINDINGS: CT HEAD FINDINGS Brain: Mild age-related atrophy and chronic microvascular ischemic changes. There is no acute intracranial hemorrhage. No mass effect or midline shift. No extra-axial fluid collection. Vascular: No hyperdense vessel or unexpected calcification. Skull: Normal. Negative for fracture or focal lesion. Sinuses/Orbits: No acute finding. Other: Left posterior parietal/occipital scalp laceration and small contusion. CT CERVICAL SPINE FINDINGS Alignment: No acute subluxation. There is grade 1 C3-C4 anterolisthesis. Skull base and vertebrae: No acute fracture. Osteopenia.  Soft tissues and spinal canal: No prevertebral fluid or swelling. No visible canal hematoma. Disc levels:  C4-C7 ACDF and disc spacer. Upper chest: Negative. Other: Bilateral carotid bulb calcified plaques. IMPRESSION: 1. No acute intracranial hemorrhage. Mild age-related atrophy and chronic microvascular ischemic changes. 2. No acute/traumatic cervical spine pathology. 3. Degenerative changes and C4-C7 ACDF and disc spacer. Electronically Signed   By: Anner Crete M.D.   On: 12/16/2020 17:10   CT Cervical Spine Wo Contrast  Result Date: 12/16/2020 CLINICAL DATA:  80 year old female with head trauma. EXAM: CT HEAD WITHOUT CONTRAST CT CERVICAL SPINE WITHOUT CONTRAST TECHNIQUE: Multidetector CT imaging of the head and cervical spine was performed following the standard protocol without intravenous contrast. Multiplanar CT image reconstructions of the cervical spine were also generated. COMPARISON:  Head CT dated 03/31/2006. FINDINGS: CT HEAD FINDINGS Brain: Mild age-related atrophy and chronic microvascular ischemic changes. There is no acute intracranial hemorrhage. No mass effect or midline shift. No extra-axial fluid collection. Vascular: No hyperdense vessel or unexpected calcification. Skull: Normal. Negative for fracture or focal lesion. Sinuses/Orbits: No  acute finding. Other: Left posterior parietal/occipital scalp laceration and small contusion. CT CERVICAL SPINE FINDINGS Alignment: No acute subluxation. There is grade 1 C3-C4 anterolisthesis. Skull base and vertebrae: No acute fracture. Osteopenia. Soft tissues and spinal canal: No prevertebral fluid or swelling. No visible canal hematoma. Disc levels:  C4-C7 ACDF and disc spacer. Upper chest: Negative. Other: Bilateral carotid bulb calcified plaques. IMPRESSION: 1. No acute intracranial hemorrhage. Mild age-related atrophy and chronic microvascular ischemic changes. 2. No acute/traumatic cervical spine pathology. 3. Degenerative changes and C4-C7 ACDF and disc spacer. Electronically Signed   By: Anner Crete M.D.   On: 12/16/2020 17:10   DG Chest Port 1 View  Result Date: 12/16/2020 CLINICAL DATA:  Fall. EXAM: PORTABLE CHEST 1 VIEW COMPARISON:  May 20, 2013. FINDINGS: Stable cardiomegaly. No pneumothorax or pleural effusion is noted. Mildly increased diffuse interstitial densities are noted concerning for possible edema or atypical inflammation. The visualized skeletal structures are unremarkable. IMPRESSION: Stable cardiomegaly. Mildly increased diffuse interstitial densities concerning for possible edema or atypical inflammation. Aortic Atherosclerosis (ICD10-I70.0). Electronically Signed   By: Marijo Conception M.D.   On: 12/16/2020 16:28    Assessment & Plan:   Kindred was seen today for anemia.  Diagnoses and all orders for this visit:  Essential hypertension- Her blood pressure is well controlled. -     Hepatic function panel; Future -     Hepatic function panel  LFTs abnormal- Her LFTs have been chronically elevated.  This is likely fatty liver disease. -     Hepatic function panel; Future -     Protime-INR; Future -     Protime-INR -     Hepatic function panel  Stage 3b chronic kidney disease ([place])- Her renal function is stable. -     CBC with Differential/Platelet; Future -      Basic metabolic panel; Future -     Basic metabolic panel -     CBC with Differential/Platelet  Deficiency anemia- Her H&H are stable. -     CBC with Differential/Platelet; Future -     CBC with Differential/Platelet  Controlled type 2 diabetes mellitus with diabetic neuropathy, without long-term current use of insulin ([place])- Her blood sugar is adequately well controlled. -     Hemoglobin A1c; Future -     Hemoglobin A1c  I have discontinued Brennen J. Roehr's Colchicine, Belsomra, and irbesartan. I am also having her  maintain her aspirin, Namzaric, citalopram, memantine, donepezil, levocetirizine, hydrochlorothiazide, simvastatin, metFORMIN, and propranolol.  No orders of the defined types were placed in this encounter.    Follow-up: Return in about 3 months (around 03/25/2021).  Scarlette Calico, MD

## 2020-12-23 NOTE — Patient Instructions (Signed)
[employer] medicine (25th ed., pp. 1059-1068). [place], [place]: Elsevier.">  Anemia  Anemia is a condition in which there is not enough red blood cells or hemoglobin in the blood. Hemoglobin is a substance in red blood cells thatcarries oxygen. When you do not have enough red blood cells or hemoglobin (are anemic), your body cannot get enough oxygen and your organs may not work properly. Asa result, you may feel very tired or have other problems. What are the causes? Common causes of anemia include: Excessive bleeding. Anemia can be caused by excessive bleeding inside or outside the body, including bleeding from the intestines or from heavy menstrual periods in females. Poor nutrition. Long-lasting (chronic) kidney, thyroid, and liver disease. Bone marrow disorders, spleen problems, and blood disorders. Cancer and treatments for cancer. HIV (human immunodeficiency virus) and AIDS (acquired immunodeficiency syndrome). Infections, medicines, and autoimmune disorders that destroy red blood cells. What are the signs or symptoms? Symptoms of this condition include: Minor weakness. Dizziness. Headache, or difficulties concentrating and sleeping. Heartbeats that feel irregular or faster than normal (palpitations). Shortness of breath, especially with exercise. Pale skin, lips, and nails, or cold hands and feet. Indigestion and nausea. Symptoms may occur suddenly or develop slowly. If your anemia is mild, you maynot have symptoms. How is this diagnosed? This condition is diagnosed based on blood tests, your medical history, and a physical exam. In some cases, a test may be needed in which cells are removed from the soft tissue inside of a bone and looked at under a microscope (bone marrow biopsy). Your health care provider may also check your stool (feces) for blood and may do additional testing to look for the cause of yourbleeding. Other tests may include: Imaging tests, such as a CT scan or  MRI. A procedure to see inside your esophagus and stomach (endoscopy). A procedure to see inside your colon and rectum (colonoscopy). How is this treated? Treatment for this condition depends on the cause. If you continue to lose a lot of blood, you may need to be treated at a hospital. Treatment may include: Taking supplements of iron, vitamin B12, or folic acid. Taking a hormone medicine (erythropoietin) that can help to stimulate red blood cell growth. Having a blood transfusion. This may be needed if you lose a lot of blood. Making changes to your diet. Having surgery to remove your spleen. Follow these instructions at home: Take over-the-counter and prescription medicines only as told by your health care provider. Take supplements only as told by your health care provider. Follow any diet instructions that you were given by your health care provider. Keep all follow-up visits as told by your health care provider. This is important. Contact a health care provider if: You develop new bleeding anywhere in the body. Get help right away if: You are very weak. You are short of breath. You have pain in your abdomen or chest. You are dizzy or feel faint. You have trouble concentrating. You have bloody stools, black stools, or tarry stools. You vomit repeatedly or you vomit up blood. These symptoms may represent a serious problem that is an emergency. Do not wait to see if the symptoms will go away. Get medical help right away. Call your local emergency services (911 in the [place] Do not drive yourself to the hospital. Summary Anemia is a condition in which you do not have enough red blood cells or enough of a substance in your red blood cells that carries oxygen (hemoglobin). Symptoms may occur suddenly   or develop slowly. If your anemia is mild, you may not have symptoms. This condition is diagnosed with blood tests, a medical history, and a physical exam. Other tests may be  needed. Treatment for this condition depends on the cause of the anemia. This information is not intended to replace advice given to you by your health care provider. Make sure you discuss any questions you have with your healthcare provider. Document Revised: 04/15/2019 Document Reviewed: 04/15/2019 Elsevier Patient Education  2022 [employer].

## 2020-12-27 ENCOUNTER — Telehealth: Payer: Self-pay | Admitting: Pharmacist

## 2020-12-27 NOTE — Progress Notes (Signed)
    Chronic Care Management Pharmacy Assistant   Name: [name]  MRN: [id number] DOB: 1941/01/11   Reason for Encounter: Disease State   Conditions to be addressed/monitored: General   Recent office visits:  12/23/20 Dr. Scarlette Calico (PCP) Reason:anemia.  Medication Changes:I have discontinued [name]'s Colchicine, Belsomra, and irbesart  Recent consult visits:  None ID  Hospital visits:  Medication Reconciliation was completed by comparing discharge summary, patient's EMR and Pharmacy list, and upon discussion with patient.  Admitted to the hospital on 12/16/20 due to Fall. Discharge date was 12/16/20. Discharged from [facility]?Medications Started at [facility] Discharge:?? -started None ID Medication Changes at Hospital Discharge: -Changed None ID  Medications Discontinued at Hospital Discharge: -Stopped None ID Medications that remain the same after Hospital Discharge:??  -All other medications will remain the same.    Medications: Outpatient Encounter Medications as of 12/27/2020  Medication Sig   aspirin 81 MG EC tablet Take 1 tablet (81 mg total) by mouth daily. Swallow whole.   citalopram (CELEXA) 20 MG tablet Take 1 tablet (20 mg total) by mouth daily.   donepezil (ARICEPT) 10 MG tablet Take 10 mg by mouth in the morning.   hydrochlorothiazide (HYDRODIURIL) 25 MG tablet TAKE 1 TABLET(25 MG) BY MOUTH DAILY (Patient taking differently: Take 25 mg by mouth in the morning.)   levocetirizine (XYZAL) 5 MG tablet TAKE 1 TABLET(5 MG) BY MOUTH EVERY EVENING (Patient taking differently: Take 5 mg by mouth every evening.)   memantine (NAMENDA) 10 MG tablet Take 10 mg by mouth in the morning and at bedtime.   Memantine HCl-Donepezil HCl (NAMZARIC) 28-10 MG CP24 Take 1 capsule at bedtime   metFORMIN (GLUCOPHAGE) 500 MG tablet TAKE 1 TABLET(500 MG) BY MOUTH TWICE DAILY WITH A MEAL (Patient taking differently: Take 500 mg by mouth 2 (two) times daily with a  meal.)   propranolol (INDERAL) 20 MG tablet TAKE 1 TABLET(20 MG) BY MOUTH TWICE DAILY (Patient taking differently: Take 20 mg by mouth 2 (two) times daily.)   simvastatin (ZOCOR) 20 MG tablet TAKE 1 TABLET(20 MG) BY MOUTH DAILY AT 6 PM (Patient taking differently: Take 20 mg by mouth every evening.)   No facility-administered encounter medications on file as of 12/27/2020.    Pharmacist Review Have you had any problems recently with your health? Spoke with patient daughter [name] who states that the patient had another fall in the house when she tripped over the doggy mat. She states that she did not get hurt.  Have you had any problems with your pharmacy? Daughter states that there is no problems with getting medications or the cost of medications from the pharmacy  What issues or side effects are you having with your medications? Daughter states that she does not have any side effects from medications   What would you like me to pass along to [facility] for them to help you with?  Daughter states that she believes that the patient would benefit from having physical therapy, if she can get help with that it would be great  What can we do to take care of you better?  Daughter states that right now things are ok   Star Rating Drugs: Metformin 500 mg last fill 10/03/20 90 ds Simvastatin 20 mg last fill 09/27/20 90 ds   Ethelene Hal Clinical Pharmacist Assistant [phone]   Time spent:29

## 2020-12-29 ENCOUNTER — Ambulatory Visit: Payer: Medicare Other | Admitting: Neurology

## 2021-01-21 ENCOUNTER — Ambulatory Visit (INDEPENDENT_AMBULATORY_CARE_PROVIDER_SITE_OTHER): Payer: Medicare Other | Admitting: Neurology

## 2021-01-21 ENCOUNTER — Encounter: Payer: Self-pay | Admitting: Neurology

## 2021-01-21 VITALS — BP 138/72 | HR 74 | Ht 62.0 in | Wt 136.0 lb

## 2021-01-21 DIAGNOSIS — F0390 Unspecified dementia without behavioral disturbance: Secondary | ICD-10-CM

## 2021-01-21 DIAGNOSIS — R410 Disorientation, unspecified: Secondary | ICD-10-CM | POA: Diagnosis not present

## 2021-01-21 MED ORDER — NAMZARIC 28-10 MG PO CP24: ORAL_CAPSULE | ORAL | 1 refills | Status: DC

## 2021-01-21 NOTE — Progress Notes (Signed)
Reason for visit: Alzheimer's disease  [name] is an 80 y.o. female  History of present illness:  Ms. Yelinek is an 80 year old right-handed white female with a history of progressive memory disturbance consistent with Alzheimer's disease.  The patient was recently in the emergency room after a fall on 16 December 2020, striking her head.  The patient underwent CT scan of the brain that was unremarkable, and then she went back home and fell again hitting her head.  Since that time, the family indicates that she has had a more dramatic decline in her cognitive functioning level.  The patient has had increased difficulty with walking, she is shuffling her feet.  She has not had any further falls.  Her verbal output has declined, she is mumbling more.  She is refusing to take medications.  She has had some increase in agitation, particularly in the evening.  She has had an 11 pound weight loss over the last several months.  She is not having hallucinations but she does think that her parents are still living.  The patient is doing more unusual things such as putting orange juice on her cereal.  She is needing more assistance with personal care, and help with bathing and dressing.  She comes back here for further evaluation.  Past Medical History:  Diagnosis Date   Contact dermatitis and other eczema, due to unspecified cause    Displacement of lumbar intervertebral disc without myelopathy    FH: cataracts    Gout, unspecified    Memory loss    Occlusion and stenosis of carotid artery without mention of cerebral infarction    Other and unspecified hyperlipidemia    Pain in joint, shoulder region    Pain in limb    Routine general medical examination at a health care facility    Sciatica    Type II or unspecified type diabetes mellitus without mention of complication, uncontrolled    Unspecified essential hypertension    Unspecified venous (peripheral) insufficiency     Past Surgical  History:  Procedure Laterality Date   ANTERIOR CERVICAL DECOMP/DISCECTOMY FUSION N/A 12/22/2014   Procedure: Anterior Cervical Four-Five/Five-Six/Six-Seven Decompression/Diskectomy/ and Fsion;  Surgeon: Leeroy Cha, MD;  Location: [facility];  Service: Neurosurgery;  Laterality: N/A;  C4-5 C5-6 C6-7 Anterior cervical decompression/diskectomy/fusion   LAPAROSCOPIC APPENDECTOMY     LUMBAR LAMINECTOMY  07/2008   [place]   TUBAL LIGATION      Family History  Problem Relation Age of Onset   Alzheimer's disease Mother    Diabetes Mother    Coronary artery disease Father        CABG   Hypertension Father    Cancer Brother    Cancer Brother    Colon cancer Neg Hx    Breast cancer Neg Hx     Social history:  reports that she quit smoking about 25 years ago. Her smoking use included cigarettes. She has never used smokeless tobacco. She reports that she does not drink alcohol and does not use drugs.    Allergies  Allergen Reactions   Pneumococcal Vaccines Swelling    Per pt when received 2015   Wound Dressing Adhesive Other (See Comments)    Band-Aids will PULL OFF THE SKIN if left on for a decent amount of time   Ampicillin Hives   Neomycin-Bacitracin Zn-Polymyx Rash   Penicillins Hives    Medications:  Prior to Admission medications   Medication Sig Start Date End Date Taking?  Authorizing Provider  aspirin 81 MG EC tablet Take 1 tablet (81 mg total) by mouth daily. Swallow whole. 04/21/15   Janith Lima, MD  citalopram (CELEXA) 20 MG tablet Take 1 tablet (20 mg total) by mouth daily. 06/29/20   Suzzanne Cloud, NP  donepezil (ARICEPT) 10 MG tablet Take 10 mg by mouth in the morning.    [provider]  hydrochlorothiazide (HYDRODIURIL) 25 MG tablet TAKE 1 TABLET(25 MG) BY MOUTH DAILY Patient taking differently: Take 25 mg by mouth in the morning. 09/01/20   Janith Lima, MD  levocetirizine (XYZAL) 5 MG tablet TAKE 1 TABLET(5 MG) BY MOUTH EVERY EVENING Patient taking  differently: Take 5 mg by mouth every evening. 08/27/20   Janith Lima, MD  memantine (NAMENDA) 10 MG tablet Take 10 mg by mouth in the morning and at bedtime.    [provider]  Memantine HCl-Donepezil HCl [facility]) 28-10 MG CP24 Take 1 capsule at bedtime 06/29/20   Suzzanne Cloud, NP  metFORMIN (GLUCOPHAGE) 500 MG tablet TAKE 1 TABLET(500 MG) BY MOUTH TWICE DAILY WITH A MEAL Patient taking differently: Take 500 mg by mouth 2 (two) times daily with a meal. 10/03/20   Janith Lima, MD  propranolol (INDERAL) 20 MG tablet TAKE 1 TABLET(20 MG) BY MOUTH TWICE DAILY Patient taking differently: Take 20 mg by mouth 2 (two) times daily. 11/19/20   Janith Lima, MD  simvastatin (ZOCOR) 20 MG tablet TAKE 1 TABLET(20 MG) BY MOUTH DAILY AT 6 PM Patient taking differently: Take 20 mg by mouth every evening. 09/27/20   Janith Lima, MD    ROS:  Out of a complete 14 system review of symptoms, the patient complains only of the following symptoms, and all other reviewed systems are negative.  Memory disturbance Agitation Weight loss Walking difficulty  Blood pressure 138/72, pulse 74, height '5\' 2"'$  (1.575 m), weight 136 lb (61.7 kg).  Physical Exam  General: The patient is alert and cooperative at the time of the examination.  Skin: 3+ edema below the knees is seen bilaterally, distal redness of the skin is noted.   Neurologic Exam  Mental status: The patient is alert and oriented x 1 at the time examination, she is not oriented to place or date.  Mini-Mental status examination done today shows a total score of 8/30.   Cranial nerves: Facial symmetry is present. Speech is normal, no aphasia or dysarthria is noted. Extraocular movements are full. Visual fields are full.  Motor: The patient has good strength in all 4 extremities.  Sensory examination: Soft touch sensation is symmetric on the face, arms, and legs.  Coordination: The patient has good finger-nose-finger and  heel-to-shin bilaterally.  Mild apraxia with use of the extremities is noted.  Gait and station: The can walk independently, she does shuffle some, with turns.  Reflexes: Deep tendon reflexes are symmetric.   CT head and cervical 12/16/20:  IMPRESSION: 1. No acute intracranial hemorrhage. Mild age-related atrophy and chronic microvascular ischemic changes. 2. No acute/traumatic cervical spine pathology. 3. Degenerative changes and C4-C7 ACDF and disc spacer.  * CT scan images were reviewed online. I agree with the written report.    Assessment/Plan:  1.  Alzheimer's disease, progressive dementia  2.  Recent fall with head trauma, concussion  The patient has had some decline in her cognitive abilities that may be related to a concussion with 2 falls that occurred recently in July 2022.  The family has noted  a progressive accelerated decline in her cognitive status.  For this reason, I will check CT scan of the head to exclude a possible subdural hematoma.  A prescription for Namzeric was sent in.  The may need to crush medications to get her to take her pills.  She will follow-up in 6 months, in the future, she can be followed through Dr. Brett Fairy.  Jill Alexanders MD 01/21/2021 9:54 AM  [facility] [address] [place],  [address]  Phone [phone] Fax [phone]

## 2021-01-25 ENCOUNTER — Telehealth: Payer: Self-pay | Admitting: Neurology

## 2021-01-25 NOTE — Telephone Encounter (Signed)
Medicare/BCBS supp order sent to [facility]. [employer] they will reach out to the patient to schedule.

## 2021-02-01 ENCOUNTER — Emergency Department (HOSPITAL_COMMUNITY): Payer: Medicare Other

## 2021-02-01 ENCOUNTER — Emergency Department (HOSPITAL_COMMUNITY)
Admission: EM | Admit: 2021-02-01 | Discharge: 2021-02-01 | Disposition: A | Discharge status: Home / Self Care | Payer: Medicare Other | Source: Home / Self Care | Attending: Emergency Medicine | Admitting: Emergency Medicine

## 2021-02-01 DIAGNOSIS — F0390 Unspecified dementia without behavioral disturbance: Secondary | ICD-10-CM | POA: Insufficient documentation

## 2021-02-01 DIAGNOSIS — W19XXXA Unspecified fall, initial encounter: Secondary | ICD-10-CM | POA: Insufficient documentation

## 2021-02-01 DIAGNOSIS — Z79899 Other long term (current) drug therapy: Secondary | ICD-10-CM | POA: Insufficient documentation

## 2021-02-01 DIAGNOSIS — J439 Emphysema, unspecified: Secondary | ICD-10-CM | POA: Diagnosis not present

## 2021-02-01 DIAGNOSIS — Z7984 Long term (current) use of oral hypoglycemic drugs: Secondary | ICD-10-CM | POA: Insufficient documentation

## 2021-02-01 DIAGNOSIS — Z66 Do not resuscitate: Secondary | ICD-10-CM | POA: Diagnosis not present

## 2021-02-01 DIAGNOSIS — Z7982 Long term (current) use of aspirin: Secondary | ICD-10-CM | POA: Insufficient documentation

## 2021-02-01 DIAGNOSIS — Z20822 Contact with and (suspected) exposure to covid-19: Secondary | ICD-10-CM | POA: Diagnosis not present

## 2021-02-01 DIAGNOSIS — N390 Urinary tract infection, site not specified: Secondary | ICD-10-CM | POA: Insufficient documentation

## 2021-02-01 DIAGNOSIS — I10 Essential (primary) hypertension: Secondary | ICD-10-CM | POA: Diagnosis not present

## 2021-02-01 DIAGNOSIS — E1122 Type 2 diabetes mellitus with diabetic chronic kidney disease: Secondary | ICD-10-CM | POA: Insufficient documentation

## 2021-02-01 DIAGNOSIS — S065X0A Traumatic subdural hemorrhage without loss of consciousness, initial encounter: Secondary | ICD-10-CM | POA: Diagnosis not present

## 2021-02-01 DIAGNOSIS — M47812 Spondylosis without myelopathy or radiculopathy, cervical region: Secondary | ICD-10-CM | POA: Diagnosis not present

## 2021-02-01 DIAGNOSIS — L89312 Pressure ulcer of right buttock, stage 2: Secondary | ICD-10-CM | POA: Diagnosis not present

## 2021-02-01 DIAGNOSIS — S0003XA Contusion of scalp, initial encounter: Secondary | ICD-10-CM | POA: Diagnosis not present

## 2021-02-01 DIAGNOSIS — G4489 Other headache syndrome: Secondary | ICD-10-CM | POA: Diagnosis not present

## 2021-02-01 DIAGNOSIS — M4802 Spinal stenosis, cervical region: Secondary | ICD-10-CM | POA: Diagnosis not present

## 2021-02-01 DIAGNOSIS — E1140 Type 2 diabetes mellitus with diabetic neuropathy, unspecified: Secondary | ICD-10-CM | POA: Insufficient documentation

## 2021-02-01 DIAGNOSIS — S0101XA Laceration without foreign body of scalp, initial encounter: Secondary | ICD-10-CM | POA: Insufficient documentation

## 2021-02-01 DIAGNOSIS — S199XXA Unspecified injury of neck, initial encounter: Secondary | ICD-10-CM | POA: Diagnosis not present

## 2021-02-01 DIAGNOSIS — S0990XA Unspecified injury of head, initial encounter: Secondary | ICD-10-CM | POA: Diagnosis not present

## 2021-02-01 DIAGNOSIS — I129 Hypertensive chronic kidney disease with stage 1 through stage 4 chronic kidney disease, or unspecified chronic kidney disease: Secondary | ICD-10-CM | POA: Insufficient documentation

## 2021-02-01 DIAGNOSIS — R58 Hemorrhage, not elsewhere classified: Secondary | ICD-10-CM | POA: Diagnosis not present

## 2021-02-01 DIAGNOSIS — I6523 Occlusion and stenosis of bilateral carotid arteries: Secondary | ICD-10-CM | POA: Diagnosis not present

## 2021-02-01 DIAGNOSIS — N183 Chronic kidney disease, stage 3 unspecified: Secondary | ICD-10-CM | POA: Insufficient documentation

## 2021-02-01 DIAGNOSIS — Z87891 Personal history of nicotine dependence: Secondary | ICD-10-CM | POA: Insufficient documentation

## 2021-02-01 DIAGNOSIS — R64 Cachexia: Secondary | ICD-10-CM | POA: Diagnosis not present

## 2021-02-01 DIAGNOSIS — R600 Localized edema: Secondary | ICD-10-CM | POA: Insufficient documentation

## 2021-02-01 LAB — CBC WITH DIFFERENTIAL/PLATELET
Abs Immature Granulocytes: 0.04 10*3/uL (ref 0.00–0.07)
Basophils Absolute: 0 10*3/uL (ref 0.0–0.1)
Basophils Relative: 0 %
Eosinophils Absolute: 0.5 10*3/uL (ref 0.0–0.5)
Eosinophils Relative: 5 %
HCT: 33.5 % — ABNORMAL LOW (ref 36.0–46.0)
Hemoglobin: 10.5 g/dL — ABNORMAL LOW (ref 12.0–15.0)
Immature Granulocytes: 0 %
Lymphocytes Relative: 19 %
Lymphs Abs: 1.8 10*3/uL (ref 0.7–4.0)
MCH: 28.1 pg (ref 26.0–34.0)
MCHC: 31.3 g/dL (ref 30.0–36.0)
MCV: 89.6 fL (ref 80.0–100.0)
Monocytes Absolute: 0.9 10*3/uL (ref 0.1–1.0)
Monocytes Relative: 10 %
Neutro Abs: 6.1 10*3/uL (ref 1.7–7.7)
Neutrophils Relative %: 66 %
Platelets: 428 10*3/uL — ABNORMAL HIGH (ref 150–400)
RBC: 3.74 MIL/uL — ABNORMAL LOW (ref 3.87–5.11)
RDW: 16.4 % — ABNORMAL HIGH (ref 11.5–15.5)
WBC: 9.3 10*3/uL (ref 4.0–10.5)
nRBC: 0 % (ref 0.0–0.2)

## 2021-02-01 LAB — URINALYSIS, ROUTINE W REFLEX MICROSCOPIC
Bilirubin Urine: NEGATIVE
Glucose, UA: NEGATIVE mg/dL
Hgb urine dipstick: NEGATIVE
Ketones, ur: NEGATIVE mg/dL
Nitrite: NEGATIVE
Protein, ur: NEGATIVE mg/dL
Specific Gravity, Urine: 1.004 — ABNORMAL LOW (ref 1.005–1.030)
WBC, UA: >50 WBC/hpf — ABNORMAL HIGH (ref 0–5)
pH: 8 (ref 5.0–8.0)

## 2021-02-01 LAB — BASIC METABOLIC PANEL
Anion gap: 9 (ref 5–15)
BUN: 19 mg/dL (ref 8–23)
CO2: 24 mmol/L (ref 22–32)
Calcium: 8.8 mg/dL — ABNORMAL LOW (ref 8.9–10.3)
Chloride: 102 mmol/L (ref 98–111)
Creatinine, Ser: 1.03 mg/dL — ABNORMAL HIGH (ref 0.44–1.00)
GFR, Estimated: 55 mL/min — ABNORMAL LOW (ref >60)
Glucose, Bld: 154 mg/dL — ABNORMAL HIGH (ref 70–99)
Potassium: 4.8 mmol/L (ref 3.5–5.1)
Sodium: 135 mmol/L (ref 135–145)

## 2021-02-01 MED ORDER — CEFDINIR 300 MG PO CAPS: 300.0000 mg | ORAL_CAPSULE | Freq: Two times a day (BID) | ORAL | 0 refills | Status: DC

## 2021-02-01 MED ORDER — LIDOCAINE-EPINEPHRINE (PF) 2 %-1:200000 IJ SOLN
20.0000 mL | Freq: Once | INTRAMUSCULAR | Status: AC
  Administered 2021-02-01: 20 mL
  Filled 2021-02-01: qty 20

## 2021-02-01 NOTE — ED Provider Notes (Signed)
[facility] EMERGENCY DEPARTMENT Provider Note   CSN: [id number] Arrival date & time: 02/01/21  1321     History Chief Complaint  Patient presents with   Lytle Michaels    [name] is a 80 y.o. female.  She is brought in from home by EMS after a fall.  Unknown of loss of consciousness.  Laceration to back of head and somewhere in mouth.  Patient is recall fall and does not know why she is here.  EMS states she had some repetitive questioning in route.  Per neurology notes patient has had increased confusion since last fall few months ago and is being set up for an MRI.  Level 5 caveat secondary to dementia.  The history is provided by the patient and the EMS personnel.  Fall This is a recurrent problem. The current episode started 1 to 2 hours ago. The problem has not changed since onset.Pertinent negatives include no chest pain, no abdominal pain, no headaches and no shortness of breath. Nothing aggravates the symptoms. Nothing relieves the symptoms. She has tried nothing for the symptoms. The treatment provided no relief.      Past Medical History:  Diagnosis Date   Contact dermatitis and other eczema, due to unspecified cause    Displacement of lumbar intervertebral disc without myelopathy    FH: cataracts    Gout, unspecified    Memory loss    Occlusion and stenosis of carotid artery without mention of cerebral infarction    Other and unspecified hyperlipidemia    Pain in joint, shoulder region    Pain in limb    Routine general medical examination at a health care facility    Sciatica    Type II or unspecified type diabetes mellitus without mention of complication, uncontrolled    Unspecified essential hypertension    Unspecified venous (peripheral) insufficiency     Patient Active Problem List   Diagnosis Date Noted   LFTs abnormal 12/23/2020   Deficiency anemia 07/27/2020   Insomnia, 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   Corns and callus 07/27/2020    Chronic renal disease, stage 3, moderately decreased glomerular filtration rate (GFR) between 30-59 mL/min/1.73 square meter ([place]) 09/26/2018   Senile dementia without behavioral disturbance ([place]) 03/21/2017   Atherosclerosis of native artery of both lower extremities with intermittent claudication ([place]) 11/24/2015   Cervical stenosis of spinal canal 12/22/2014   Visit for screening mammogram 09/16/2014   Routine health maintenance 01/03/2011   Peripheral neuropathy ([place]) 08/23/2010    Class: Diagnosis of   DM (diabetes mellitus) type II controlled, neurological manifestation ([place]) 07/21/2010   Carotid artery stenosis 12/15/2009   DEGENERATIVE [place] DISEASE, LUMBOSACRAL SPINE W/RADICULOPATHY 12/29/2008   Dyslipidemia, goal LDL below 70 02/16/2007   Gout 02/16/2007   Essential hypertension 02/16/2007    Past Surgical History:  Procedure Laterality Date   ANTERIOR CERVICAL DECOMP/DISCECTOMY FUSION N/A 12/22/2014   Procedure: Anterior Cervical Four-Five/Five-Six/Six-Seven Decompression/Diskectomy/ and Fsion;  Surgeon: Leeroy Cha, MD;  Location: [facility];  Service: Neurosurgery;  Laterality: N/A;  C4-5 C5-6 C6-7 Anterior cervical decompression/diskectomy/fusion   LAPAROSCOPIC APPENDECTOMY     LUMBAR LAMINECTOMY  07/2008   [place]   TUBAL LIGATION       OB History   No obstetric history on file.     Family History  Problem Relation Age of Onset   Alzheimer's disease Mother    Diabetes Mother    Coronary artery disease Father        CABG  Hypertension Father    Cancer Brother    Cancer Brother    Colon cancer Neg Hx    Breast cancer Neg Hx     Social History   Tobacco Use   Smoking status: Former    Types: Cigarettes    Quit date: 05/23/1995    Years since quitting: 25.7   Smokeless tobacco: Never  Substance Use Topics   Alcohol use: No   Drug use: No    Home Medications Prior to Admission medications   Medication Sig Start Date End Date Taking? Authorizing  Provider  aspirin 81 MG EC tablet Take 1 tablet (81 mg total) by mouth daily. Swallow whole. 04/21/15   Janith Lima, MD  citalopram (CELEXA) 20 MG tablet Take 1 tablet (20 mg total) by mouth daily. 06/29/20   Suzzanne Cloud, NP  hydrochlorothiazide (HYDRODIURIL) 25 MG tablet TAKE 1 TABLET(25 MG) BY MOUTH DAILY Patient taking differently: Take 25 mg by mouth in the morning. 09/01/20   Janith Lima, MD  levocetirizine (XYZAL) 5 MG tablet TAKE 1 TABLET(5 MG) BY MOUTH EVERY EVENING Patient taking differently: Take 5 mg by mouth every evening. 08/27/20   Janith Lima, MD  Memantine HCl-Donepezil HCl [facility]) 28-10 MG CP24 Take 1 capsule at bedtime 01/21/21   Kathrynn Ducking, MD  metFORMIN (GLUCOPHAGE) 500 MG tablet TAKE 1 TABLET(500 MG) BY MOUTH TWICE DAILY WITH A MEAL Patient taking differently: Take 500 mg by mouth 2 (two) times daily with a meal. 10/03/20   Janith Lima, MD  propranolol (INDERAL) 20 MG tablet TAKE 1 TABLET(20 MG) BY MOUTH TWICE DAILY Patient taking differently: Take 20 mg by mouth 2 (two) times daily. 11/19/20   Janith Lima, MD  simvastatin (ZOCOR) 20 MG tablet TAKE 1 TABLET(20 MG) BY MOUTH DAILY AT 6 PM Patient taking differently: Take 20 mg by mouth every evening. 09/27/20   Janith Lima, MD    Allergies    Pneumococcal vaccines, Wound dressing adhesive, Ampicillin, Neomycin-bacitracin zn-polymyx, and Penicillins  Review of Systems   Review of Systems  Constitutional:  Negative for fever.  HENT:  Negative for trouble swallowing.   Eyes:  Negative for visual disturbance.  Respiratory:  Negative for shortness of breath.   Cardiovascular:  Negative for chest pain.  Gastrointestinal:  Negative for abdominal pain.  Genitourinary:  Negative for dysuria.  Musculoskeletal:  Negative for neck pain.  Skin:  Positive for wound.  Neurological:  Negative for headaches.   Physical Exam Updated Vital Signs BP (!) 153/89   Pulse 71   Temp 98.6 F (37 C) (Oral)    Resp 17   SpO2 95%   Physical Exam Vitals and nursing note reviewed.  Constitutional:      General: She is not in acute distress.    Appearance: Normal appearance. She is well-developed.  HENT:     Head: Normocephalic.     Comments: Dried blood and abrasion on back of head    Mouth/Throat:     Comments: She is some dried blood in her oropharynx.  Do not see any obvious tongue laceration. Eyes:     Conjunctiva/sclera: Conjunctivae normal.  Cardiovascular:     Rate and Rhythm: Normal rate and regular rhythm.     Heart sounds: No murmur heard. Pulmonary:     Effort: Pulmonary effort is normal. No respiratory distress.     Breath sounds: Normal breath sounds.  Abdominal:     Palpations: Abdomen is soft.  Tenderness: There is no abdominal tenderness.  Musculoskeletal:        General: Normal range of motion.     Cervical back: Neck supple.     Right lower leg: Edema present.     Left lower leg: Edema present.  Skin:    General: Skin is warm and dry.  Neurological:     General: No focal deficit present.     Mental Status: She is alert. She is disoriented.     Motor: No weakness.     Comments: She is awake and oriented to herself.  She does not know why she is at the hospital.  She was able to lift/move all 4 extremities against gravity.  No slurred speech or obvious facial asymmetry.    ED Results / Procedures / Treatments   Labs (all labs ordered are listed, but only abnormal results are displayed) Labs Reviewed  BASIC METABOLIC PANEL - Abnormal; Notable for the following components:      Result Value   Glucose, Bld 154 (*)    Creatinine, Ser 1.03 (*)    Calcium 8.8 (*)    GFR, Estimated 55 (*)    All other components within normal limits  CBC WITH DIFFERENTIAL/PLATELET - Abnormal; Notable for the following components:   RBC 3.74 (*)    Hemoglobin 10.5 (*)    HCT 33.5 (*)    RDW 16.4 (*)    Platelets 428 (*)    All other components within normal limits   URINALYSIS, ROUTINE W REFLEX MICROSCOPIC - Abnormal; Notable for the following components:   Color, Urine STRAW (*)    Specific Gravity, Urine 1.004 (*)    Leukocytes,Ua LARGE (*)    WBC, UA >50 (*)    Bacteria, UA RARE (*)    Non Squamous Epithelial 0-5 (*)    All other components within normal limits  URINE CULTURE    EKG EKG Interpretation  Date/Time:  Tuesday February 01 2021 13:37:42 EDT Ventricular Rate:  75 PR Interval:  175 QRS Duration: 90 QT Interval:  407 QTC Calculation: 455 R Axis:   59 Text Interpretation: Sinus rhythm Probable left atrial enlargement Low voltage, precordial leads No significant change since prior 7/22 Confirmed by Aletta Edouard [phone]) on 02/01/2021 1:43:00 PM  Radiology CT Head Wo Contrast  Result Date: 02/01/2021 CLINICAL DATA:  Head and neck trauma. EXAM: CT HEAD WITHOUT CONTRAST CT CERVICAL SPINE WITHOUT CONTRAST TECHNIQUE: Multidetector CT imaging of the head and cervical spine was performed following the standard protocol without intravenous contrast. Multiplanar CT image reconstructions of the cervical spine were also generated. COMPARISON:  December 16, 2020. FINDINGS: CT HEAD FINDINGS Brain: Mild chronic ischemic white matter disease is noted. No mass effect or midline shift is noted. Ventricular size is within normal limits. There is no evidence of mass lesion, hemorrhage or acute infarction. Vascular: No hyperdense vessel or unexpected calcification. Skull: Normal. Negative for fracture or focal lesion. Sinuses/Orbits: No acute finding. Other: Small left posterior scalp hematoma is noted. CT CERVICAL SPINE FINDINGS Alignment: Mild grade 1 anterolisthesis of C3-4 is noted secondary to posterior facet joint hypertrophy. Skull base and vertebrae: No acute fracture. No primary bone lesion or focal pathologic process. Soft tissues and spinal canal: No prevertebral fluid or swelling. No visible canal hematoma. Disc levels: Status post surgical anterior  fusion of C4-5, C5-6 and C6-7. Severe degenerative disc disease is noted at C3-4 and C7-T1. Upper chest: Negative. Other: None. IMPRESSION: Small left posterior scalp hematoma. No acute  intracranial abnormality seen. Postsurgical and degenerative changes are noted in the cervical spine as described above. No acute abnormality is noted. Electronically Signed   By: Marijo Conception M.D.   On: 02/01/2021 15:00   CT Cervical Spine Wo Contrast  Result Date: 02/01/2021 CLINICAL DATA:  Head and neck trauma. EXAM: CT HEAD WITHOUT CONTRAST CT CERVICAL SPINE WITHOUT CONTRAST TECHNIQUE: Multidetector CT imaging of the head and cervical spine was performed following the standard protocol without intravenous contrast. Multiplanar CT image reconstructions of the cervical spine were also generated. COMPARISON:  December 16, 2020. FINDINGS: CT HEAD FINDINGS Brain: Mild chronic ischemic white matter disease is noted. No mass effect or midline shift is noted. Ventricular size is within normal limits. There is no evidence of mass lesion, hemorrhage or acute infarction. Vascular: No hyperdense vessel or unexpected calcification. Skull: Normal. Negative for fracture or focal lesion. Sinuses/Orbits: No acute finding. Other: Small left posterior scalp hematoma is noted. CT CERVICAL SPINE FINDINGS Alignment: Mild grade 1 anterolisthesis of C3-4 is noted secondary to posterior facet joint hypertrophy. Skull base and vertebrae: No acute fracture. No primary bone lesion or focal pathologic process. Soft tissues and spinal canal: No prevertebral fluid or swelling. No visible canal hematoma. Disc levels: Status post surgical anterior fusion of C4-5, C5-6 and C6-7. Severe degenerative disc disease is noted at C3-4 and C7-T1. Upper chest: Negative. Other: None. IMPRESSION: Small left posterior scalp hematoma. No acute intracranial abnormality seen. Postsurgical and degenerative changes are noted in the cervical spine as described above. No acute  abnormality is noted. Electronically Signed   By: Marijo Conception M.D.   On: 02/01/2021 15:00    Procedures Procedures   Medications Ordered in ED Medications  lidocaine-EPINEPHrine (XYLOCAINE W/EPI) 2 %-1:200000 (PF) injection 20 mL (20 mLs Infiltration Given 02/01/21 1531)    ED Course  I have reviewed the triage vital signs and the nursing notes.  Pertinent labs & imaging results that were available during my care of the patient were reviewed by me and considered in my medical decision making (see chart for details).  Clinical Course as of 02/01/21 1830  Tue Feb 01, 2021  1511 Reviewed prior culture results with pharmacy and they are recommending cefdinir [MB]  1557 She has an abrasion on the back of her head that does not require any staples.  We will contact family to see if we can get her home. [MB]    Clinical Course User Index [MB] Hayden Rasmussen, MD   MDM Rules/Calculators/A&P                          This patient complains of fall head injury; this involves an extensive number of treatment Options and is a complaint that carries with it a high risk of complications and Morbidity. The differential includes skull fracture, head bleed laceration, cervical fracture, metabolic derangement, UTI  I ordered, reviewed and interpreted labs, which included CBC with normal white count, hemoglobin priors, chemistries fairly unremarkable urinalysis of infection greater than 50 whites I ordered medication none I ordered imaging studies which included CT head and cervical spine and I independently    visualized and interpreted imaging which showed no acute findings and scalp hematoma Additional history obtained from EMS Previous records obtained and reviewed in epic does have prior visit for fall, has seen neurology since currently working up her confusion  After the interventions stated above, I reevaluated the patient and found patient  to be symptomatically improved and asking to  go home.  Blood pressure mildly elevated.  She seems to be at her baseline.  Reaching out to family if they can come into confirm that she is at her baseline and can be safely returned home.   Final Clinical Impression(s) / ED Diagnoses Final diagnoses:  Fall, initial encounter  Minor head injury, initial encounter  Laceration of scalp, initial encounter  Lower urinary tract infectious disease    Rx / DC Orders ED Discharge Orders          Ordered    cefdinir (OMNICEF) 300 MG capsule  2 times daily        02/01/21 1512             Hayden Rasmussen, MD 02/01/21 1836

## 2021-02-01 NOTE — ED Triage Notes (Addendum)
Pt here via EMS from home with c/o fall. Unknown LOC, laceration to back of head and mouth trauma. Bleeding controlled. Per EMS pt is a/o X4 at baseline. Pt now has repetitive questioning en route and on arrival and is alert to self and place only. Pt has been falling multiple times and has CT/MRI outpatient appt. Not on thinners  174/82 HR 78 96% RA  203 CBG 18g LFA

## 2021-02-01 NOTE — Discharge Instructions (Signed)
You were seen in the emergency department for evaluation of injuries from a fall.  You had a CAT scan of your head and neck along with blood work and urinalysis that did not show any serious findings.  You do have signs of urinary tract infection and we are sending a prescription of antibiotics to your pharmacy.  You had some abrasions to the back of your head that did not require repair.  Please follow-up with your regular doctor.  Return if any worsening or concerning symptoms

## 2021-02-02 ENCOUNTER — Emergency Department (HOSPITAL_COMMUNITY): Payer: Medicare Other

## 2021-02-02 ENCOUNTER — Inpatient Hospital Stay (HOSPITAL_COMMUNITY)
Admission: EM | Admit: 2021-02-02 | Discharge: 2021-02-10 | DRG: 086 | Disposition: A | Discharge status: Rehabilitation Facility | Payer: Medicare Other | Attending: Family Medicine | Admitting: Family Medicine

## 2021-02-02 DIAGNOSIS — M109 Gout, unspecified: Secondary | ICD-10-CM | POA: Diagnosis present

## 2021-02-02 DIAGNOSIS — R7989 Other specified abnormal findings of blood chemistry: Secondary | ICD-10-CM | POA: Diagnosis not present

## 2021-02-02 DIAGNOSIS — L89312 Pressure ulcer of right buttock, stage 2: Secondary | ICD-10-CM | POA: Diagnosis present

## 2021-02-02 DIAGNOSIS — R404 Transient alteration of awareness: Secondary | ICD-10-CM | POA: Diagnosis not present

## 2021-02-02 DIAGNOSIS — R64 Cachexia: Secondary | ICD-10-CM | POA: Diagnosis present

## 2021-02-02 DIAGNOSIS — Z7982 Long term (current) use of aspirin: Secondary | ICD-10-CM

## 2021-02-02 DIAGNOSIS — E785 Hyperlipidemia, unspecified: Secondary | ICD-10-CM | POA: Diagnosis present

## 2021-02-02 DIAGNOSIS — J439 Emphysema, unspecified: Secondary | ICD-10-CM | POA: Diagnosis not present

## 2021-02-02 DIAGNOSIS — M4802 Spinal stenosis, cervical region: Secondary | ICD-10-CM | POA: Diagnosis present

## 2021-02-02 DIAGNOSIS — Z66 Do not resuscitate: Secondary | ICD-10-CM | POA: Diagnosis present

## 2021-02-02 DIAGNOSIS — W1830XA Fall on same level, unspecified, initial encounter: Secondary | ICD-10-CM | POA: Diagnosis present

## 2021-02-02 DIAGNOSIS — R5381 Other malaise: Secondary | ICD-10-CM | POA: Diagnosis present

## 2021-02-02 DIAGNOSIS — I10 Essential (primary) hypertension: Secondary | ICD-10-CM | POA: Diagnosis not present

## 2021-02-02 DIAGNOSIS — Z87891 Personal history of nicotine dependence: Secondary | ICD-10-CM

## 2021-02-02 DIAGNOSIS — N183 Chronic kidney disease, stage 3 unspecified: Secondary | ICD-10-CM | POA: Diagnosis present

## 2021-02-02 DIAGNOSIS — Z20822 Contact with and (suspected) exposure to covid-19: Secondary | ICD-10-CM | POA: Diagnosis present

## 2021-02-02 DIAGNOSIS — B961 Klebsiella pneumoniae [K. pneumoniae] as the cause of diseases classified elsewhere: Secondary | ICD-10-CM | POA: Diagnosis present

## 2021-02-02 DIAGNOSIS — I872 Venous insufficiency (chronic) (peripheral): Secondary | ICD-10-CM | POA: Diagnosis present

## 2021-02-02 DIAGNOSIS — F0390 Unspecified dementia without behavioral disturbance: Secondary | ICD-10-CM | POA: Diagnosis present

## 2021-02-02 DIAGNOSIS — Y92009 Unspecified place in unspecified non-institutional (private) residence as the place of occurrence of the external cause: Secondary | ICD-10-CM

## 2021-02-02 DIAGNOSIS — Z7984 Long term (current) use of oral hypoglycemic drugs: Secondary | ICD-10-CM

## 2021-02-02 DIAGNOSIS — K824 Cholesterolosis of gallbladder: Secondary | ICD-10-CM | POA: Diagnosis present

## 2021-02-02 DIAGNOSIS — Z888 Allergy status to other drugs, medicaments and biological substances status: Secondary | ICD-10-CM

## 2021-02-02 DIAGNOSIS — R402362 Coma scale, best motor response, obeys commands, at arrival to emergency department: Secondary | ICD-10-CM | POA: Diagnosis present

## 2021-02-02 DIAGNOSIS — M47812 Spondylosis without myelopathy or radiculopathy, cervical region: Secondary | ICD-10-CM | POA: Diagnosis not present

## 2021-02-02 DIAGNOSIS — E1149 Type 2 diabetes mellitus with other diabetic neurological complication: Secondary | ICD-10-CM | POA: Diagnosis present

## 2021-02-02 DIAGNOSIS — N1832 Chronic kidney disease, stage 3b: Secondary | ICD-10-CM | POA: Diagnosis present

## 2021-02-02 DIAGNOSIS — E1142 Type 2 diabetes mellitus with diabetic polyneuropathy: Secondary | ICD-10-CM | POA: Diagnosis present

## 2021-02-02 DIAGNOSIS — W19XXXA Unspecified fall, initial encounter: Secondary | ICD-10-CM

## 2021-02-02 DIAGNOSIS — Z833 Family history of diabetes mellitus: Secondary | ICD-10-CM

## 2021-02-02 DIAGNOSIS — R402142 Coma scale, eyes open, spontaneous, at arrival to emergency department: Secondary | ICD-10-CM | POA: Diagnosis present

## 2021-02-02 DIAGNOSIS — R296 Repeated falls: Secondary | ICD-10-CM

## 2021-02-02 DIAGNOSIS — Z880 Allergy status to penicillin: Secondary | ICD-10-CM

## 2021-02-02 DIAGNOSIS — R402242 Coma scale, best verbal response, confused conversation, at arrival to emergency department: Secondary | ICD-10-CM | POA: Diagnosis present

## 2021-02-02 DIAGNOSIS — R2689 Other abnormalities of gait and mobility: Secondary | ICD-10-CM | POA: Diagnosis present

## 2021-02-02 DIAGNOSIS — Z820 Family history of epilepsy and other diseases of the nervous system: Secondary | ICD-10-CM

## 2021-02-02 DIAGNOSIS — Z887 Allergy status to serum and vaccine status: Secondary | ICD-10-CM

## 2021-02-02 DIAGNOSIS — S0101XA Laceration without foreign body of scalp, initial encounter: Secondary | ICD-10-CM

## 2021-02-02 DIAGNOSIS — S0003XA Contusion of scalp, initial encounter: Secondary | ICD-10-CM | POA: Diagnosis not present

## 2021-02-02 DIAGNOSIS — R0902 Hypoxemia: Secondary | ICD-10-CM | POA: Diagnosis not present

## 2021-02-02 DIAGNOSIS — Z981 Arthrodesis status: Secondary | ICD-10-CM

## 2021-02-02 DIAGNOSIS — Z881 Allergy status to other antibiotic agents status: Secondary | ICD-10-CM

## 2021-02-02 DIAGNOSIS — F0280 Dementia in other diseases classified elsewhere without behavioral disturbance: Secondary | ICD-10-CM | POA: Diagnosis present

## 2021-02-02 DIAGNOSIS — S065X0A Traumatic subdural hemorrhage without loss of consciousness, initial encounter: Secondary | ICD-10-CM | POA: Diagnosis not present

## 2021-02-02 DIAGNOSIS — R54 Age-related physical debility: Secondary | ICD-10-CM | POA: Diagnosis present

## 2021-02-02 DIAGNOSIS — Z79899 Other long term (current) drug therapy: Secondary | ICD-10-CM

## 2021-02-02 DIAGNOSIS — S065XAA Traumatic subdural hemorrhage with loss of consciousness status unknown, initial encounter: Secondary | ICD-10-CM | POA: Diagnosis present

## 2021-02-02 DIAGNOSIS — R1310 Dysphagia, unspecified: Secondary | ICD-10-CM | POA: Diagnosis present

## 2021-02-02 DIAGNOSIS — G309 Alzheimer's disease, unspecified: Secondary | ICD-10-CM | POA: Diagnosis present

## 2021-02-02 DIAGNOSIS — E1122 Type 2 diabetes mellitus with diabetic chronic kidney disease: Secondary | ICD-10-CM | POA: Diagnosis present

## 2021-02-02 DIAGNOSIS — N390 Urinary tract infection, site not specified: Secondary | ICD-10-CM | POA: Diagnosis present

## 2021-02-02 DIAGNOSIS — Z8249 Family history of ischemic heart disease and other diseases of the circulatory system: Secondary | ICD-10-CM

## 2021-02-02 DIAGNOSIS — S065X9A Traumatic subdural hemorrhage with loss of consciousness of unspecified duration, initial encounter: Secondary | ICD-10-CM | POA: Diagnosis present

## 2021-02-02 DIAGNOSIS — S199XXA Unspecified injury of neck, initial encounter: Secondary | ICD-10-CM | POA: Diagnosis not present

## 2021-02-02 DIAGNOSIS — L859 Epidermal thickening, unspecified: Secondary | ICD-10-CM | POA: Diagnosis present

## 2021-02-02 DIAGNOSIS — I6523 Occlusion and stenosis of bilateral carotid arteries: Secondary | ICD-10-CM | POA: Diagnosis not present

## 2021-02-02 DIAGNOSIS — L8990 Pressure ulcer of unspecified site, unspecified stage: Secondary | ICD-10-CM | POA: Insufficient documentation

## 2021-02-02 DIAGNOSIS — I129 Hypertensive chronic kidney disease with stage 1 through stage 4 chronic kidney disease, or unspecified chronic kidney disease: Secondary | ICD-10-CM | POA: Diagnosis present

## 2021-02-02 DIAGNOSIS — R7401 Elevation of levels of liver transaminase levels: Secondary | ICD-10-CM | POA: Diagnosis present

## 2021-02-02 LAB — COMPREHENSIVE METABOLIC PANEL
ALT: 43 U/L (ref 0–44)
AST: 36 U/L (ref 15–41)
Albumin: 2.9 g/dL — ABNORMAL LOW (ref 3.5–5.0)
Alkaline Phosphatase: 228 U/L — ABNORMAL HIGH (ref 38–126)
Anion gap: 9 (ref 5–15)
BUN: 13 mg/dL (ref 8–23)
CO2: 25 mmol/L (ref 22–32)
Calcium: 9.1 mg/dL (ref 8.9–10.3)
Chloride: 102 mmol/L (ref 98–111)
Creatinine, Ser: 1.04 mg/dL — ABNORMAL HIGH (ref 0.44–1.00)
GFR, Estimated: 54 mL/min — ABNORMAL LOW (ref >60)
Glucose, Bld: 135 mg/dL — ABNORMAL HIGH (ref 70–99)
Potassium: 3.8 mmol/L (ref 3.5–5.1)
Sodium: 136 mmol/L (ref 135–145)
Total Bilirubin: 0.9 mg/dL (ref 0.3–1.2)
Total Protein: 6.4 g/dL — ABNORMAL LOW (ref 6.5–8.1)

## 2021-02-02 LAB — CBC WITH DIFFERENTIAL/PLATELET
Abs Immature Granulocytes: 0.03 10*3/uL (ref 0.00–0.07)
Basophils Absolute: 0 10*3/uL (ref 0.0–0.1)
Basophils Relative: 0 %
Eosinophils Absolute: 0.2 10*3/uL (ref 0.0–0.5)
Eosinophils Relative: 1 %
HCT: 31.3 % — ABNORMAL LOW (ref 36.0–46.0)
Hemoglobin: 10.1 g/dL — ABNORMAL LOW (ref 12.0–15.0)
Immature Granulocytes: 0 %
Lymphocytes Relative: 20 %
Lymphs Abs: 2.1 10*3/uL (ref 0.7–4.0)
MCH: 28.1 pg (ref 26.0–34.0)
MCHC: 32.3 g/dL (ref 30.0–36.0)
MCV: 87.2 fL (ref 80.0–100.0)
Monocytes Absolute: 0.9 10*3/uL (ref 0.1–1.0)
Monocytes Relative: 8 %
Neutro Abs: 7.3 10*3/uL (ref 1.7–7.7)
Neutrophils Relative %: 71 %
Platelets: 421 10*3/uL — ABNORMAL HIGH (ref 150–400)
RBC: 3.59 MIL/uL — ABNORMAL LOW (ref 3.87–5.11)
RDW: 16.2 % — ABNORMAL HIGH (ref 11.5–15.5)
WBC: 10.5 10*3/uL (ref 4.0–10.5)
nRBC: 0 % (ref 0.0–0.2)

## 2021-02-02 LAB — RESP PANEL BY RT-PCR (FLU A&B, COVID) ARPGX2
Influenza A by PCR: NEGATIVE
Influenza B by PCR: NEGATIVE
SARS Coronavirus 2 by RT PCR: NEGATIVE

## 2021-02-02 LAB — PROTIME-INR
INR: 1.1 (ref 0.8–1.2)
Prothrombin Time: 14.2 seconds (ref 11.4–15.2)

## 2021-02-02 MED ORDER — HYDROCHLOROTHIAZIDE 25 MG PO TABS: 25.0000 mg | ORAL_TABLET | Freq: Every day | ORAL | Status: DC

## 2021-02-02 MED ORDER — DONEPEZIL HCL 10 MG PO TABS
10.0000 mg | ORAL_TABLET | Freq: Every day | ORAL | Status: DC
  Administered 2021-02-03 – 2021-02-09 (×7): 10 mg via ORAL
  Filled 2021-02-02 (×9): qty 1

## 2021-02-02 MED ORDER — SODIUM CHLORIDE 0.9 % IV SOLN
1.0000 g | INTRAVENOUS | Status: DC
  Administered 2021-02-02 – 2021-02-03 (×2): 1 g via INTRAVENOUS
  Filled 2021-02-02 (×2): qty 10

## 2021-02-02 MED ORDER — MEMANTINE HCL ER 28 MG PO CP24
28.0000 mg | ORAL_CAPSULE | Freq: Every day | ORAL | Status: DC
  Administered 2021-02-03 – 2021-02-09 (×7): 28 mg via ORAL
  Filled 2021-02-02 (×9): qty 1

## 2021-02-02 MED ORDER — ACETAMINOPHEN 325 MG PO TABS
650.0000 mg | ORAL_TABLET | Freq: Four times a day (QID) | ORAL | Status: DC | PRN
  Administered 2021-02-03 – 2021-02-06 (×2): 650 mg via ORAL
  Filled 2021-02-02 (×3): qty 2

## 2021-02-02 MED ORDER — ONDANSETRON HCL 4 MG PO TABS: 4.0000 mg | ORAL_TABLET | Freq: Four times a day (QID) | ORAL | Status: DC | PRN

## 2021-02-02 MED ORDER — MEMANTINE HCL-DONEPEZIL HCL 28-10 MG PO CP24
ORAL_CAPSULE | Freq: Every day | ORAL | Status: DC
Start: 2021-02-02 — End: 2021-02-02

## 2021-02-02 MED ORDER — ACETAMINOPHEN 650 MG RE SUPP: 650.0000 mg | Freq: Four times a day (QID) | RECTAL | Status: DC | PRN

## 2021-02-02 MED ORDER — PROPRANOLOL HCL 10 MG PO TABS
20.0000 mg | ORAL_TABLET | Freq: Two times a day (BID) | ORAL | Status: DC
  Administered 2021-02-03 – 2021-02-10 (×14): 20 mg via ORAL
  Filled 2021-02-02: qty 1
  Filled 2021-02-02 (×5): qty 2
  Filled 2021-02-02: qty 1
  Filled 2021-02-02 (×6): qty 2
  Filled 2021-02-02: qty 1
  Filled 2021-02-02 (×4): qty 2

## 2021-02-02 MED ORDER — CITALOPRAM HYDROBROMIDE 10 MG PO TABS
10.0000 mg | ORAL_TABLET | Freq: Every day | ORAL | Status: DC
  Administered 2021-02-04 – 2021-02-10 (×7): 10 mg via ORAL
  Filled 2021-02-02 (×7): qty 1

## 2021-02-02 MED ORDER — LIDOCAINE-EPINEPHRINE-TETRACAINE (LET) TOPICAL GEL
3.0000 mL | Freq: Once | TOPICAL | Status: AC
  Administered 2021-02-02: 3 mL via TOPICAL
  Filled 2021-02-02: qty 3

## 2021-02-02 MED ORDER — ONDANSETRON HCL 4 MG/2ML IJ SOLN: 4.0000 mg | Freq: Four times a day (QID) | INTRAMUSCULAR | Status: DC | PRN

## 2021-02-02 MED ORDER — SIMVASTATIN 20 MG PO TABS
20.0000 mg | ORAL_TABLET | Freq: Every evening | ORAL | Status: DC
  Filled 2021-02-02: qty 1

## 2021-02-02 NOTE — ED Notes (Signed)
Patient transported to CT 

## 2021-02-02 NOTE — ED Triage Notes (Signed)
Pt BIB [facility] after fall since visit yesterday. Pt fell again and EMS reports new lac in addition to reopening her wound from yesterday. EMS reports that pt usually uses walker but has been noncompliant lately resulting in multiple falls. Pt also has hx of dementia.

## 2021-02-02 NOTE — H&P (Signed)
History and Physical    [name] [id number] DOB: [birth year] DOA: 02/02/2021  PCP: Janith Lima, MD  Patient coming from: Home via EMS  I have personally briefly reviewed patient's old medical records in [place]  Chief Complaint: Fall  HPI: [name] is a 80 y.o. female with medical history significant for Alzheimer's dementia, T2DM, HTN, CKD stage IIIb who presented to the ED for evaluation after recurrent fall at home.  Patient is unable to provide history due to her dementia which is otherwise supplemented by EDP, chart review, and daughter at bedside. Patient initially came to the ED on 9/13 after a fall at home.  She was found to have an abrasion on the back of her head which did not require any repair.  CT head showed a small left posterior scalp hematoma without acute intracranial abnormality.  CT cervical spine showed postsurgical and degenerative changes without acute abnormality. Urinalysis was abnormal.  Urine culture was obtained and pending.  Patient was started on oral cefdinir for suspected UTI.  Patient returned to the hospital today (9/14) due to repeat fall.  Daughter states that patient currently lives with her husband.  She is normally supposed to be using a walker to help with ambulation but she has not been doing this consistently.  Today she was walking into the kitchen without her walker.  She was turning around and her feet got tangled up and she fell to the ground.  She did hit the back of her head but did not lose consciousness.  Daughter states that patient has had notable increased confusion over the last 1 and half weeks.  She has had notable progressive accelerated decline in her cognitive abilities over the last several months as noted by most recent neurology documentation.  ED Course:  Initial vitals showed BP 198/84, pulse 93, RR 25, temp not recorded, SPO2 100% on room air.  SARS-CoV-2 PCR and influenza are negative.  CBC, CMP, INR  ordered and pending.  CT head without contrast showed an 8 mm right parafalcine subdural hemorrhage extending inferiorly along the right tentorium.  Left occipital scalp 12 mm hematoma without underlying calvarial fracture noted.  CT cervical spine without contrast was negative for acute displaced fracture or traumatic listhesis of the C-spine.  C4-C7 anterior and interbody fusion noted.  Multilevel severe degenerative changes of the spine with severe bilateral osseous neural foraminal stenosis at the C3-C4 level reported.  Occipital scalp laceration was repaired with placement of 5 staples by EDP.  Neurosurgery were consulted and recommended medical admission, holding aspirin for 1 week, and repeat CT head in AM.  Currently no surgical indication.  The hospitalist service was consulted to admit for further evaluation and management.  Review of Systems:  Unable to obtain full review of systems due to dementia.   Past Medical History:  Diagnosis Date   Contact dermatitis and other eczema, due to unspecified cause    Displacement of lumbar intervertebral disc without myelopathy    FH: cataracts    Gout, unspecified    Memory loss    Occlusion and stenosis of carotid artery without mention of cerebral infarction    Other and unspecified hyperlipidemia    Pain in joint, shoulder region    Pain in limb    Routine general medical examination at a health care facility    Sciatica    Type II or unspecified type diabetes mellitus without mention of complication, uncontrolled    Unspecified essential  hypertension    Unspecified venous (peripheral) insufficiency     Past Surgical History:  Procedure Laterality Date   ANTERIOR CERVICAL DECOMP/DISCECTOMY FUSION N/A 12/22/2014   Procedure: Anterior Cervical Four-Five/Five-Six/Six-Seven Decompression/Diskectomy/ and Fsion;  Surgeon: Leeroy Cha, MD;  Location: [facility];  Service: Neurosurgery;  Laterality: N/A;  C4-5 C5-6 C6-7 Anterior  cervical decompression/diskectomy/fusion   LAPAROSCOPIC APPENDECTOMY     LUMBAR LAMINECTOMY  07/2008   [place]   TUBAL LIGATION      Social History:  reports that she quit smoking about 25 years ago. Her smoking use included cigarettes. She has never used smokeless tobacco. She reports that she does not drink alcohol and does not use drugs.  Allergies  Allergen Reactions   Pneumococcal Vaccines Swelling    Per pt when received 2015   Wound Dressing Adhesive Other (See Comments)    Band-Aids will PULL OFF THE SKIN if left on for a decent amount of time   Ampicillin Hives   Neomycin-Bacitracin Zn-Polymyx Rash   Penicillins Hives    Family History  Problem Relation Age of Onset   Alzheimer's disease Mother    Diabetes Mother    Coronary artery disease Father        CABG   Hypertension Father    Cancer Brother    Cancer Brother    Colon cancer Neg Hx    Breast cancer Neg Hx      Prior to Admission medications   Medication Sig Start Date End Date Taking? Authorizing Provider  aspirin 81 MG EC tablet Take 1 tablet (81 mg total) by mouth daily. Swallow whole. 04/21/15   Janith Lima, MD  cefdinir (OMNICEF) 300 MG capsule Take 1 capsule (300 mg total) by mouth 2 (two) times daily. 02/01/21   Hayden Rasmussen, MD  citalopram (CELEXA) 20 MG tablet Take 1 tablet (20 mg total) by mouth daily. 06/29/20   Suzzanne Cloud, NP  hydrochlorothiazide (HYDRODIURIL) 25 MG tablet TAKE 1 TABLET(25 MG) BY MOUTH DAILY Patient taking differently: Take 25 mg by mouth in the morning. 09/01/20   Janith Lima, MD  levocetirizine (XYZAL) 5 MG tablet TAKE 1 TABLET(5 MG) BY MOUTH EVERY EVENING Patient taking differently: Take 5 mg by mouth every evening. 08/27/20   Janith Lima, MD  Memantine HCl-Donepezil HCl [facility]) 28-10 MG CP24 Take 1 capsule at bedtime 01/21/21   Kathrynn Ducking, MD  metFORMIN (GLUCOPHAGE) 500 MG tablet TAKE 1 TABLET(500 MG) BY MOUTH TWICE DAILY WITH A MEAL Patient taking  differently: Take 500 mg by mouth 2 (two) times daily with a meal. 10/03/20   Janith Lima, MD  propranolol (INDERAL) 20 MG tablet TAKE 1 TABLET(20 MG) BY MOUTH TWICE DAILY Patient taking differently: Take 20 mg by mouth 2 (two) times daily. 11/19/20   Janith Lima, MD  simvastatin (ZOCOR) 20 MG tablet TAKE 1 TABLET(20 MG) BY MOUTH DAILY AT 6 PM Patient taking differently: Take 20 mg by mouth every evening. 09/27/20   Janith Lima, MD    Physical Exam: Vitals:   02/02/21 1800 02/02/21 1830 02/02/21 2017 02/02/21 2030  BP: (!) 198/84 (!) 186/77 (!) 174/65 (!) 157/57  Pulse: 90 87 97 100  Resp: (!) 25 17 (!) 30 (!) 21  SpO2: 100% 100% 98% 98%   Exam somewhat limited due to dementia. Constitutional: Elderly woman resting supine in bed, NAD, calm, comfortable Eyes: PERRL, lids and conjunctivae normal Head: Head bandage in place over occipital scalp laceration  ENMT: Mucous membranes are dry. Posterior pharynx clear of any exudate or lesions.Normal dentition.  Neck: normal, supple, no masses. Respiratory: clear to auscultation anteriorly.  Normal respiratory effort. No accessory muscle use.  Cardiovascular: Regular rate and rhythm, no murmurs / rubs / gallops.  +1 bilateral lower extremity edema. Abdomen: no tenderness, no masses palpated. No hepatosplenomegaly. Bowel sounds positive.  Musculoskeletal: no clubbing / cyanosis. No joint deformity upper and lower extremities. Good ROM, no contractures. Normal muscle tone.  Skin: Chronic thick stasis dermatitis changes bilateral lower extremities. Neurologic: CN 2-12 grossly intact. Sensation intact. Strength 5/5 in all 4.  Psychiatric: Awake, alert, and following commands appropriately.  Oriented to self but not place, time, situation, or able to name daughter at bedside.  Labs on Admission: I have personally reviewed following labs and imaging studies  CBC: Recent Labs  Lab 02/01/21 1411  WBC 9.3  NEUTROABS 6.1  HGB 10.5*  HCT 33.5*   MCV 89.6  PLT 123456*   Basic Metabolic Panel: Recent Labs  Lab 02/01/21 1411  NA 135  K 4.8  CL 102  CO2 24  GLUCOSE 154*  BUN 19  CREATININE 1.03*  CALCIUM 8.8*   GFR: Estimated Creatinine Clearance: 37 mL/min (A) (by C-G formula based on SCr of 1.03 mg/dL (H)). Liver Function Tests: No results for input(s): AST, ALT, ALKPHOS, BILITOT, PROT, ALBUMIN in the last 168 hours. No results for input(s): LIPASE, AMYLASE in the last 168 hours. No results for input(s): AMMONIA in the last 168 hours. Coagulation Profile: No results for input(s): INR, PROTIME in the last 168 hours. Cardiac Enzymes: No results for input(s): CKTOTAL, CKMB, CKMBINDEX, TROPONINI in the last 168 hours. BNP (last 3 results) No results for input(s): PROBNP in the last 8760 hours. HbA1C: No results for input(s): HGBA1C in the last 72 hours. CBG: No results for input(s): GLUCAP in the last 168 hours. Lipid Profile: No results for input(s): CHOL, HDL, LDLCALC, TRIG, CHOLHDL, LDLDIRECT in the last 72 hours. Thyroid Function Tests: No results for input(s): TSH, T4TOTAL, FREET4, T3FREE, THYROIDAB in the last 72 hours. Anemia Panel: No results for input(s): VITAMINB12, FOLATE, FERRITIN, TIBC, IRON, RETICCTPCT in the last 72 hours. Urine analysis:    Component Value Date/Time   COLORURINE STRAW (A) 02/01/2021 1410   APPEARANCEUR CLEAR 02/01/2021 1410   LABSPEC 1.004 (L) 02/01/2021 1410   PHURINE 8.0 02/01/2021 1410   GLUCOSEU NEGATIVE 02/01/2021 1410   GLUCOSEU NEGATIVE 07/27/2020 1511   HGBUR NEGATIVE 02/01/2021 1410   BILIRUBINUR NEGATIVE 02/01/2021 1410   BILIRUBINUR negative 09/13/2012 1128   KETONESUR NEGATIVE 02/01/2021 1410   PROTEINUR NEGATIVE 02/01/2021 1410   UROBILINOGEN 0.2 07/27/2020 1511   NITRITE NEGATIVE 02/01/2021 1410   LEUKOCYTESUR LARGE (A) 02/01/2021 1410    Radiological Exams on Admission: CT HEAD WO CONTRAST (5MM)  Result Date: 02/02/2021 CLINICAL DATA:  Recurrent fall, new  occipital laceration. EXAM: CT HEAD WITHOUT CONTRAST CT CERVICAL SPINE WITHOUT CONTRAST TECHNIQUE: Multidetector CT imaging of the head and cervical spine was performed following the standard protocol without intravenous contrast. Multiplanar CT image reconstructions of the cervical spine were also generated. COMPARISON:  CT head and cervical spine 02/01/2021 FINDINGS: CT HEAD FINDINGS BRAIN: BRAIN Patchy and confluent areas of decreased attenuation are noted throughout the deep and periventricular white matter of the cerebral hemispheres bilaterally, compatible with chronic microvascular ischemic disease. No evidence of large-territorial acute infarction. No parenchymal hemorrhage. No mass lesion. Interval development of a right parafalcine hyperdense extra-axial fluid extending along  the right tentorium. On coronal this measures up to 8 mm along the falx cerebral (4:28). No mass effect or midline shift. No hydrocephalus. Basilar cisterns are patent. Vascular: No hyperdense vessel. Atherosclerotic calcifications are present within the cavernous internal carotid arteries. Skull: No acute fracture or focal lesion. Sinuses/Orbits: Paranasal sinuses and mastoid air cells are clear. The orbits are unremarkable. Other: Left occipital scalp 12 mm hematoma with associated subcutaneus soft tissue edema and emphysema. No retained radiopaque foreign body. CT CERVICAL SPINE FINDINGS Alignment: Grade 1 anterolisthesis of C3 on C4. Skull base and vertebrae: C4 through C7 anterior and interbody fusion. Multilevel severe degenerative changes of the spine. Severe bilateral osseous neural foraminal stenosis at the C3-C4 level. No acute fracture. No aggressive appearing focal osseous lesion or focal pathologic process. Soft tissues and spinal canal: No prevertebral fluid or swelling. No visible canal hematoma. Upper chest: Unremarkable. Other: None. IMPRESSION: 1. An 8 mm right parafalcine subdural hemorrhage extending inferiorly  along the right tentorium. 2. Left occipital scalp 12 mm hematoma with no underlying calvarial fracture. 3. No acute displaced fracture or traumatic listhesis of the cervical spine. 4. C4 - C7 anterior and interbody fusion. Multilevel severe degenerative changes of the spine with severe bilateral osseous neural foraminal stenosis at the C3-C4 level. These results were called by telephone at the time of interpretation on 02/02/2021 at 7:26 pm to provider [facility] , who verbally acknowledged these results. Electronically Signed   By: Iven Finn M.D.   On: 02/02/2021 19:29   CT Head Wo Contrast  Result Date: 02/01/2021 CLINICAL DATA:  Head and neck trauma. EXAM: CT HEAD WITHOUT CONTRAST CT CERVICAL SPINE WITHOUT CONTRAST TECHNIQUE: Multidetector CT imaging of the head and cervical spine was performed following the standard protocol without intravenous contrast. Multiplanar CT image reconstructions of the cervical spine were also generated. COMPARISON:  December 16, 2020. FINDINGS: CT HEAD FINDINGS Brain: Mild chronic ischemic white matter disease is noted. No mass effect or midline shift is noted. Ventricular size is within normal limits. There is no evidence of mass lesion, hemorrhage or acute infarction. Vascular: No hyperdense vessel or unexpected calcification. Skull: Normal. Negative for fracture or focal lesion. Sinuses/Orbits: No acute finding. Other: Small left posterior scalp hematoma is noted. CT CERVICAL SPINE FINDINGS Alignment: Mild grade 1 anterolisthesis of C3-4 is noted secondary to posterior facet joint hypertrophy. Skull base and vertebrae: No acute fracture. No primary bone lesion or focal pathologic process. Soft tissues and spinal canal: No prevertebral fluid or swelling. No visible canal hematoma. Disc levels: Status post surgical anterior fusion of C4-5, C5-6 and C6-7. Severe degenerative disc disease is noted at C3-4 and C7-T1. Upper chest: Negative. Other: None. IMPRESSION: Small  left posterior scalp hematoma. No acute intracranial abnormality seen. Postsurgical and degenerative changes are noted in the cervical spine as described above. No acute abnormality is noted. Electronically Signed   By: Marijo Conception M.D.   On: 02/01/2021 15:00   CT Cervical Spine Wo Contrast  Result Date: 02/02/2021 CLINICAL DATA:  Recurrent fall, new occipital laceration. EXAM: CT HEAD WITHOUT CONTRAST CT CERVICAL SPINE WITHOUT CONTRAST TECHNIQUE: Multidetector CT imaging of the head and cervical spine was performed following the standard protocol without intravenous contrast. Multiplanar CT image reconstructions of the cervical spine were also generated. COMPARISON:  CT head and cervical spine 02/01/2021 FINDINGS: CT HEAD FINDINGS BRAIN: BRAIN Patchy and confluent areas of decreased attenuation are noted throughout the deep and periventricular white matter of the cerebral hemispheres  bilaterally, compatible with chronic microvascular ischemic disease. No evidence of large-territorial acute infarction. No parenchymal hemorrhage. No mass lesion. Interval development of a right parafalcine hyperdense extra-axial fluid extending along the right tentorium. On coronal this measures up to 8 mm along the falx cerebral (4:28). No mass effect or midline shift. No hydrocephalus. Basilar cisterns are patent. Vascular: No hyperdense vessel. Atherosclerotic calcifications are present within the cavernous internal carotid arteries. Skull: No acute fracture or focal lesion. Sinuses/Orbits: Paranasal sinuses and mastoid air cells are clear. The orbits are unremarkable. Other: Left occipital scalp 12 mm hematoma with associated subcutaneus soft tissue edema and emphysema. No retained radiopaque foreign body. CT CERVICAL SPINE FINDINGS Alignment: Grade 1 anterolisthesis of C3 on C4. Skull base and vertebrae: C4 through C7 anterior and interbody fusion. Multilevel severe degenerative changes of the spine. Severe bilateral  osseous neural foraminal stenosis at the C3-C4 level. No acute fracture. No aggressive appearing focal osseous lesion or focal pathologic process. Soft tissues and spinal canal: No prevertebral fluid or swelling. No visible canal hematoma. Upper chest: Unremarkable. Other: None. IMPRESSION: 1. An 8 mm right parafalcine subdural hemorrhage extending inferiorly along the right tentorium. 2. Left occipital scalp 12 mm hematoma with no underlying calvarial fracture. 3. No acute displaced fracture or traumatic listhesis of the cervical spine. 4. C4 - C7 anterior and interbody fusion. Multilevel severe degenerative changes of the spine with severe bilateral osseous neural foraminal stenosis at the C3-C4 level. These results were called by telephone at the time of interpretation on 02/02/2021 at 7:26 pm to provider [facility] , who verbally acknowledged these results. Electronically Signed   By: Iven Finn M.D.   On: 02/02/2021 19:29   CT Cervical Spine Wo Contrast  Result Date: 02/01/2021 CLINICAL DATA:  Head and neck trauma. EXAM: CT HEAD WITHOUT CONTRAST CT CERVICAL SPINE WITHOUT CONTRAST TECHNIQUE: Multidetector CT imaging of the head and cervical spine was performed following the standard protocol without intravenous contrast. Multiplanar CT image reconstructions of the cervical spine were also generated. COMPARISON:  December 16, 2020. FINDINGS: CT HEAD FINDINGS Brain: Mild chronic ischemic white matter disease is noted. No mass effect or midline shift is noted. Ventricular size is within normal limits. There is no evidence of mass lesion, hemorrhage or acute infarction. Vascular: No hyperdense vessel or unexpected calcification. Skull: Normal. Negative for fracture or focal lesion. Sinuses/Orbits: No acute finding. Other: Small left posterior scalp hematoma is noted. CT CERVICAL SPINE FINDINGS Alignment: Mild grade 1 anterolisthesis of C3-4 is noted secondary to posterior facet joint hypertrophy. Skull  base and vertebrae: No acute fracture. No primary bone lesion or focal pathologic process. Soft tissues and spinal canal: No prevertebral fluid or swelling. No visible canal hematoma. Disc levels: Status post surgical anterior fusion of C4-5, C5-6 and C6-7. Severe degenerative disc disease is noted at C3-4 and C7-T1. Upper chest: Negative. Other: None. IMPRESSION: Small left posterior scalp hematoma. No acute intracranial abnormality seen. Postsurgical and degenerative changes are noted in the cervical spine as described above. No acute abnormality is noted. Electronically Signed   By: Marijo Conception M.D.   On: 02/01/2021 15:00    EKG: Personally reviewed. Sinus rhythm without acute ischemic changes.  Not significantly changed when compared to prior.  Assessment/Plan Principal Problem:   Subdural hematoma, post-traumatic (HCC) Active Problems:   Essential hypertension   DM (diabetes mellitus) type II controlled, neurological manifestation (HCC)   Senile dementia without behavioral disturbance (HCC)   Chronic renal disease, stage 3,  moderately decreased glomerular filtration rate (GFR) between 30-59 mL/min/1.73 square meter (HCC)   Recurrent falls   Laceration of occipital region of scalp   TATYANA MENDIVIL is a 80 y.o. female with medical history significant for Alzheimer's dementia, T2DM, HTN, CKD stage IIIb who is admitted with subdural hematoma after recurrent fall at home.  Subdural hematoma: CT head showed 8 mm right parafalcine subdural hemorrhage extending inferiorly along the right tentorium.  Occurring after recurrent fall at home. -Neurosurgery recommend repeat CT head in a.m., hold aspirin for 1 week  Recurrent falls at home: Progressively worsening gait instability per daughter.  Not consistently using walker.  Likely sequela of advanced dementia.  High risk for future falls at home. -PT/OT eval -Continue fall precautions  Alzheimer's dementia: Family noting progressive  accelerated cognitive decline over the last few months.  High risk for delirium/sundowning.  Follows with neurology as an outpatient. -Continue memantine-donepezil -Continue Celexa -Delirium precautions  Occipital scalp laceration: Occurring after fall at home.  S/p repair in the ED with 5 staples placed.  Will need staple removal in 7-14 days.  UTI: Abnormal urinalysis noted on ED visit 9/13.  Patient was started on cefdinir for suspected UTI.  Will place on IV ceftriaxone.  Follow urine culture collected 9/13.  Type 2 diabetes: Last A1c 6.2% 12/23/2020.  Hold home metformin.  Hypertension: Continue HCTZ and propranolol.  CKD stage IIIb: Stable with renal function actually improved from recent baseline.  She was taken off irbesartan by PCP last month.  Hyperlipidemia: Continue simvastatin.  DVT prophylaxis: SCDs Code Status: DNR, confirmed with patient's daughter on admission Family Communication: Discussed with patient's daughter at bedside Disposition Plan: From home, dispo pending clinical progress Consults called: EDP discussed with on-call neurosurgery Level of care: Telemetry Medical Admission status:  Status is: Observation  The patient remains OBS appropriate and will d/c before 2 midnights.  Dispo: The patient is from: Home              Anticipated d/c is to:  Home versus SNF              Patient currently is not medically stable to d/c.   Zada Finders MD [facility]  If 7PM-7AM, please contact night-coverage [email or web address]  02/02/2021, 9:21 PM

## 2021-02-02 NOTE — ED Provider Notes (Addendum)
[facility] EMERGENCY DEPARTMENT Provider Note   CSN: [id number] Arrival date & time: 02/02/21  1740     History Chief Complaint  Patient presents with   Lytle Michaels    BENNYE CILIBERTI is a 80 y.o. female.  The history is provided by the patient and medical records. The history is limited by the condition of the patient. No language interpreter was used.  Fall This is a recurrent problem. The current episode started less than 1 hour ago. The problem occurs rarely. The problem has not changed since onset.Associated symptoms include headaches. Pertinent negatives include no chest pain, no abdominal pain and no shortness of breath. Nothing aggravates the symptoms. Nothing relieves the symptoms. She has tried nothing for the symptoms. The treatment provided no relief.      Past Medical History:  Diagnosis Date   Contact dermatitis and other eczema, due to unspecified cause    Displacement of lumbar intervertebral disc without myelopathy    FH: cataracts    Gout, unspecified    Memory loss    Occlusion and stenosis of carotid artery without mention of cerebral infarction    Other and unspecified hyperlipidemia    Pain in joint, shoulder region    Pain in limb    Routine general medical examination at a health care facility    Sciatica    Type II or unspecified type diabetes mellitus without mention of complication, uncontrolled    Unspecified essential hypertension    Unspecified venous (peripheral) insufficiency     Patient Active Problem List   Diagnosis Date Noted   LFTs abnormal 12/23/2020   Deficiency anemia 07/27/2020   Insomnia, 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   Corns and callus 07/27/2020   Chronic renal disease, stage 3, moderately decreased glomerular filtration rate (GFR) between 30-59 mL/min/1.73 square meter ([place]) 09/26/2018   Senile dementia without behavioral disturbance ([place]) 03/21/2017   Atherosclerosis of native artery of both lower extremities  with intermittent claudication ([place]) 11/24/2015   Cervical stenosis of spinal canal 12/22/2014   Visit for screening mammogram 09/16/2014   Routine health maintenance 01/03/2011   Peripheral neuropathy ([place]) 08/23/2010    Class: Diagnosis of   DM (diabetes mellitus) type II controlled, neurological manifestation ([place]) 07/21/2010   Carotid artery stenosis 12/15/2009   DEGENERATIVE [place] DISEASE, LUMBOSACRAL SPINE W/RADICULOPATHY 12/29/2008   Dyslipidemia, goal LDL below 70 02/16/2007   Gout 02/16/2007   Essential hypertension 02/16/2007    Past Surgical History:  Procedure Laterality Date   ANTERIOR CERVICAL DECOMP/DISCECTOMY FUSION N/A 12/22/2014   Procedure: Anterior Cervical Four-Five/Five-Six/Six-Seven Decompression/Diskectomy/ and Fsion;  Surgeon: Leeroy Cha, MD;  Location: [facility];  Service: Neurosurgery;  Laterality: N/A;  C4-5 C5-6 C6-7 Anterior cervical decompression/diskectomy/fusion   LAPAROSCOPIC APPENDECTOMY     LUMBAR LAMINECTOMY  07/2008   [place]   TUBAL LIGATION       OB History   No obstetric history on file.     Family History  Problem Relation Age of Onset   Alzheimer's disease Mother    Diabetes Mother    Coronary artery disease Father        CABG   Hypertension Father    Cancer Brother    Cancer Brother    Colon cancer Neg Hx    Breast cancer Neg Hx     Social History   Tobacco Use   Smoking status: Former    Types: Cigarettes    Quit date: 05/23/1995    Years since quitting: 25.7  Smokeless tobacco: Never  Substance Use Topics   Alcohol use: No   Drug use: No    Home Medications Prior to Admission medications   Medication Sig Start Date End Date Taking? Authorizing Provider  aspirin 81 MG EC tablet Take 1 tablet (81 mg total) by mouth daily. Swallow whole. 04/21/15   Janith Lima, MD  cefdinir (OMNICEF) 300 MG capsule Take 1 capsule (300 mg total) by mouth 2 (two) times daily. 02/01/21   Hayden Rasmussen, MD  citalopram (CELEXA)  20 MG tablet Take 1 tablet (20 mg total) by mouth daily. 06/29/20   Suzzanne Cloud, NP  hydrochlorothiazide (HYDRODIURIL) 25 MG tablet TAKE 1 TABLET(25 MG) BY MOUTH DAILY Patient taking differently: Take 25 mg by mouth in the morning. 09/01/20   Janith Lima, MD  levocetirizine (XYZAL) 5 MG tablet TAKE 1 TABLET(5 MG) BY MOUTH EVERY EVENING Patient taking differently: Take 5 mg by mouth every evening. 08/27/20   Janith Lima, MD  Memantine HCl-Donepezil HCl [employer]) 28-10 MG CP24 Take 1 capsule at bedtime 01/21/21   Kathrynn Ducking, MD  metFORMIN (GLUCOPHAGE) 500 MG tablet TAKE 1 TABLET(500 MG) BY MOUTH TWICE DAILY WITH A MEAL Patient taking differently: Take 500 mg by mouth 2 (two) times daily with a meal. 10/03/20   Janith Lima, MD  propranolol (INDERAL) 20 MG tablet TAKE 1 TABLET(20 MG) BY MOUTH TWICE DAILY Patient taking differently: Take 20 mg by mouth 2 (two) times daily. 11/19/20   Janith Lima, MD  simvastatin (ZOCOR) 20 MG tablet TAKE 1 TABLET(20 MG) BY MOUTH DAILY AT 6 PM Patient taking differently: Take 20 mg by mouth every evening. 09/27/20   Janith Lima, MD    Allergies    Pneumococcal vaccines, Wound dressing adhesive, Ampicillin, Neomycin-bacitracin zn-polymyx, and Penicillins  Review of Systems   Review of Systems  Unable to perform ROS: Dementia  Constitutional:  Negative for chills, diaphoresis, fatigue and fever.  Eyes:  Negative for visual disturbance.  Respiratory:  Negative for cough, chest tightness, shortness of breath and wheezing.   Cardiovascular:  Negative for chest pain, palpitations and leg swelling.  Gastrointestinal:  Negative for abdominal pain, constipation, diarrhea, nausea and vomiting.  Genitourinary:  Negative for dysuria, enuresis and frequency.  Musculoskeletal:  Negative for back pain, neck pain and neck stiffness.  Neurological:  Positive for headaches. Negative for dizziness, seizures, speech difficulty, weakness, light-headedness and  numbness.  Psychiatric/Behavioral:  Negative for agitation.   All other systems reviewed and are negative.  Physical Exam Updated Vital Signs There were no vitals taken for this visit.  Physical Exam Vitals and nursing note reviewed.  Constitutional:      General: She is not in acute distress.    Appearance: She is well-developed. She is not ill-appearing, toxic-appearing or diaphoretic.  HENT:     Head: Normocephalic and atraumatic.     Nose: Nose normal.     Mouth/Throat:     Mouth: Mucous membranes are moist.     Pharynx: No oropharyngeal exudate or posterior oropharyngeal erythema.  Eyes:     Conjunctiva/sclera: Conjunctivae normal.     Pupils: Pupils are equal, round, and reactive to light.  Cardiovascular:     Rate and Rhythm: Normal rate and regular rhythm.     Pulses: Normal pulses.     Heart sounds: No murmur heard. Pulmonary:     Effort: Pulmonary effort is normal. No respiratory distress.     Breath sounds: Normal  breath sounds. No wheezing, rhonchi or rales.  Chest:     Chest wall: No tenderness.  Abdominal:     General: Abdomen is flat. There is no distension.     Palpations: Abdomen is soft.     Tenderness: There is no abdominal tenderness. There is no guarding or rebound.  Musculoskeletal:        General: Tenderness present.     Cervical back: Neck supple. No tenderness.  Skin:    General: Skin is warm and dry.     Capillary Refill: Capillary refill takes less than 2 seconds.     Findings: No erythema or rash.  Neurological:     General: No focal deficit present.     Mental Status: She is alert and oriented to person, place, and time.     Cranial Nerves: No cranial nerve deficit, dysarthria or facial asymmetry.     Sensory: No sensory deficit.     Motor: No weakness, abnormal muscle tone or seizure activity.     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     Comments: Some difficulty following commands for finger-nose-finger testing  but eventually she was able to do so.   Oriented to person, place, and situation but does not know year.  Per EMS report to nursing patient is at her mental status baseline now aside from these recurrent falls.  Gait initially deferred due to multiple falls over the last 24 hours    ED Results / Procedures / Treatments   Labs (all labs ordered are listed, but only abnormal results are displayed) Labs Reviewed  RESP PANEL BY RT-PCR (FLU A&B, COVID) [id number]  CBC WITH DIFFERENTIAL/PLATELET  COMPREHENSIVE METABOLIC PANEL  PROTIME-INR    EKG EKG Interpretation  Date/Time:  Wednesday February 02 2021 18:18:12 EDT Ventricular Rate:  91 PR Interval:  164 QRS Duration: 88 QT Interval:  375 QTC Calculation: 462 R Axis:   50 Text Interpretation: Sinus rhythm Low voltage, precordial leads When compared to ECG yesterday, similar appearance. No STEMI Confirmed by Antony Blackbird [phone]) on 02/02/2021 6:19:23 PM  Radiology CT HEAD WO CONTRAST (5MM)  Result Date: 02/02/2021 CLINICAL DATA:  Recurrent fall, new occipital laceration. EXAM: CT HEAD WITHOUT CONTRAST CT CERVICAL SPINE WITHOUT CONTRAST TECHNIQUE: Multidetector CT imaging of the head and cervical spine was performed following the standard protocol without intravenous contrast. Multiplanar CT image reconstructions of the cervical spine were also generated. COMPARISON:  CT head and cervical spine 02/01/2021 FINDINGS: CT HEAD FINDINGS BRAIN: BRAIN Patchy and confluent areas of decreased attenuation are noted throughout the deep and periventricular white matter of the cerebral hemispheres bilaterally, compatible with chronic microvascular ischemic disease. No evidence of large-territorial acute infarction. No parenchymal hemorrhage. No mass lesion. Interval development of a right parafalcine hyperdense extra-axial fluid extending along the right tentorium. On coronal this measures up to 8 mm along the falx cerebral (4:28). No mass effect or  midline shift. No hydrocephalus. Basilar cisterns are patent. Vascular: No hyperdense vessel. Atherosclerotic calcifications are present within the cavernous internal carotid arteries. Skull: No acute fracture or focal lesion. Sinuses/Orbits: Paranasal sinuses and mastoid air cells are clear. The orbits are unremarkable. Other: Left occipital scalp 12 mm hematoma with associated subcutaneus soft tissue edema and emphysema. No retained radiopaque foreign body. CT CERVICAL SPINE FINDINGS Alignment: Grade 1 anterolisthesis of C3 on C4. Skull base and vertebrae: C4 through C7 anterior and interbody fusion. Multilevel severe degenerative changes of the spine. Severe bilateral osseous neural foraminal stenosis at the  C3-C4 level. No acute fracture. No aggressive appearing focal osseous lesion or focal pathologic process. Soft tissues and spinal canal: No prevertebral fluid or swelling. No visible canal hematoma. Upper chest: Unremarkable. Other: None. IMPRESSION: 1. An 8 mm right parafalcine subdural hemorrhage extending inferiorly along the right tentorium. 2. Left occipital scalp 12 mm hematoma with no underlying calvarial fracture. 3. No acute displaced fracture or traumatic listhesis of the cervical spine. 4. C4 - C7 anterior and interbody fusion. Multilevel severe degenerative changes of the spine with severe bilateral osseous neural foraminal stenosis at the C3-C4 level. These results were called by telephone at the time of interpretation on 02/02/2021 at 7:26 pm to provider [facility] , who verbally acknowledged these results. Electronically Signed   By: Iven Finn M.D.   On: 02/02/2021 19:29   CT Head Wo Contrast  Result Date: 02/01/2021 CLINICAL DATA:  Head and neck trauma. EXAM: CT HEAD WITHOUT CONTRAST CT CERVICAL SPINE WITHOUT CONTRAST TECHNIQUE: Multidetector CT imaging of the head and cervical spine was performed following the standard protocol without intravenous contrast. Multiplanar CT  image reconstructions of the cervical spine were also generated. COMPARISON:  December 16, 2020. FINDINGS: CT HEAD FINDINGS Brain: Mild chronic ischemic white matter disease is noted. No mass effect or midline shift is noted. Ventricular size is within normal limits. There is no evidence of mass lesion, hemorrhage or acute infarction. Vascular: No hyperdense vessel or unexpected calcification. Skull: Normal. Negative for fracture or focal lesion. Sinuses/Orbits: No acute finding. Other: Small left posterior scalp hematoma is noted. CT CERVICAL SPINE FINDINGS Alignment: Mild grade 1 anterolisthesis of C3-4 is noted secondary to posterior facet joint hypertrophy. Skull base and vertebrae: No acute fracture. No primary bone lesion or focal pathologic process. Soft tissues and spinal canal: No prevertebral fluid or swelling. No visible canal hematoma. Disc levels: Status post surgical anterior fusion of C4-5, C5-6 and C6-7. Severe degenerative disc disease is noted at C3-4 and C7-T1. Upper chest: Negative. Other: None. IMPRESSION: Small left posterior scalp hematoma. No acute intracranial abnormality seen. Postsurgical and degenerative changes are noted in the cervical spine as described above. No acute abnormality is noted. Electronically Signed   By: Marijo Conception M.D.   On: 02/01/2021 15:00   CT Cervical Spine Wo Contrast  Result Date: 02/02/2021 CLINICAL DATA:  Recurrent fall, new occipital laceration. EXAM: CT HEAD WITHOUT CONTRAST CT CERVICAL SPINE WITHOUT CONTRAST TECHNIQUE: Multidetector CT imaging of the head and cervical spine was performed following the standard protocol without intravenous contrast. Multiplanar CT image reconstructions of the cervical spine were also generated. COMPARISON:  CT head and cervical spine 02/01/2021 FINDINGS: CT HEAD FINDINGS BRAIN: BRAIN Patchy and confluent areas of decreased attenuation are noted throughout the deep and periventricular white matter of the cerebral  hemispheres bilaterally, compatible with chronic microvascular ischemic disease. No evidence of large-territorial acute infarction. No parenchymal hemorrhage. No mass lesion. Interval development of a right parafalcine hyperdense extra-axial fluid extending along the right tentorium. On coronal this measures up to 8 mm along the falx cerebral (4:28). No mass effect or midline shift. No hydrocephalus. Basilar cisterns are patent. Vascular: No hyperdense vessel. Atherosclerotic calcifications are present within the cavernous internal carotid arteries. Skull: No acute fracture or focal lesion. Sinuses/Orbits: Paranasal sinuses and mastoid air cells are clear. The orbits are unremarkable. Other: Left occipital scalp 12 mm hematoma with associated subcutaneus soft tissue edema and emphysema. No retained radiopaque foreign body. CT CERVICAL SPINE FINDINGS Alignment: Grade 1 anterolisthesis  of C3 on C4. Skull base and vertebrae: C4 through C7 anterior and interbody fusion. Multilevel severe degenerative changes of the spine. Severe bilateral osseous neural foraminal stenosis at the C3-C4 level. No acute fracture. No aggressive appearing focal osseous lesion or focal pathologic process. Soft tissues and spinal canal: No prevertebral fluid or swelling. No visible canal hematoma. Upper chest: Unremarkable. Other: None. IMPRESSION: 1. An 8 mm right parafalcine subdural hemorrhage extending inferiorly along the right tentorium. 2. Left occipital scalp 12 mm hematoma with no underlying calvarial fracture. 3. No acute displaced fracture or traumatic listhesis of the cervical spine. 4. C4 - C7 anterior and interbody fusion. Multilevel severe degenerative changes of the spine with severe bilateral osseous neural foraminal stenosis at the C3-C4 level. These results were called by telephone at the time of interpretation on 02/02/2021 at 7:26 pm to provider [facility] , who verbally acknowledged these results. Electronically  Signed   By: Iven Finn M.D.   On: 02/02/2021 19:29   CT Cervical Spine Wo Contrast  Result Date: 02/01/2021 CLINICAL DATA:  Head and neck trauma. EXAM: CT HEAD WITHOUT CONTRAST CT CERVICAL SPINE WITHOUT CONTRAST TECHNIQUE: Multidetector CT imaging of the head and cervical spine was performed following the standard protocol without intravenous contrast. Multiplanar CT image reconstructions of the cervical spine were also generated. COMPARISON:  December 16, 2020. FINDINGS: CT HEAD FINDINGS Brain: Mild chronic ischemic white matter disease is noted. No mass effect or midline shift is noted. Ventricular size is within normal limits. There is no evidence of mass lesion, hemorrhage or acute infarction. Vascular: No hyperdense vessel or unexpected calcification. Skull: Normal. Negative for fracture or focal lesion. Sinuses/Orbits: No acute finding. Other: Small left posterior scalp hematoma is noted. CT CERVICAL SPINE FINDINGS Alignment: Mild grade 1 anterolisthesis of C3-4 is noted secondary to posterior facet joint hypertrophy. Skull base and vertebrae: No acute fracture. No primary bone lesion or focal pathologic process. Soft tissues and spinal canal: No prevertebral fluid or swelling. No visible canal hematoma. Disc levels: Status post surgical anterior fusion of C4-5, C5-6 and C6-7. Severe degenerative disc disease is noted at C3-4 and C7-T1. Upper chest: Negative. Other: None. IMPRESSION: Small left posterior scalp hematoma. No acute intracranial abnormality seen. Postsurgical and degenerative changes are noted in the cervical spine as described above. No acute abnormality is noted. Electronically Signed   By: Marijo Conception M.D.   On: 02/01/2021 15:00    Procedures .Marland KitchenLaceration Repair  Date/Time: 02/02/2021 9:01 PM Performed by: Courtney Paris, MD Authorized by: Courtney Paris, MD   Consent:    Consent obtained:  Verbal   Consent given by:  Patient   Risks, benefits, and  alternatives were discussed: yes     Risks discussed:  Infection and pain   Alternatives discussed:  No treatment Universal protocol:    Procedure explained and questions answered to patient or proxy's satisfaction: yes     Imaging studies available: yes     Immediately prior to procedure, a time out was called: yes     Patient identity confirmed:  Verbally with patient and arm band Anesthesia:    Anesthesia method:  Topical application   Topical anesthetic:  LET Laceration details:    Location:  Scalp   Scalp location:  Occipital   Length (cm):  2   Depth (mm):  2 Pre-procedure details:    Preparation:  Patient was prepped and draped in usual sterile fashion and imaging obtained to evaluate for foreign  bodies Exploration:    Limited defect created (wound extended): no     Imaging outcome: foreign body not noted     Wound exploration: wound explored through full range of motion and entire depth of wound visualized     Contaminated: no   Treatment:    Area cleansed with:  Saline   Amount of cleaning:  Standard   Irrigation solution:  Sterile water   Visualized foreign bodies/material removed: no     Debridement:  None Skin repair:    Repair method:  Staples   Number of staples:  5 Approximation:    Approximation:  Close Repair type:    Repair type:  Simple Post-procedure details:    Dressing:  Antibiotic ointment and non-adherent dressing   Procedure completion:  Tolerated   CRITICAL CARE Performed by: Gwenyth Allegra Cresencio Reesor Total critical care time: 35 minutes Critical care time was exclusive of separately billable procedures and treating other patients. Critical care was necessary to treat or prevent imminent or life-threatening deterioration. Critical care was time spent personally by me on the following activities: development of treatment plan with patient and/or surrogate as well as nursing, discussions with consultants, evaluation of patient's response to treatment,  examination of patient, obtaining history from patient or surrogate, ordering and performing treatments and interventions, ordering and review of laboratory studies, ordering and review of radiographic studies, pulse oximetry and re-evaluation of patient's condition.   Medications Ordered in ED Medications  acetaminophen (TYLENOL) tablet 650 mg (has no administration in time range)    Or  acetaminophen (TYLENOL) suppository 650 mg (has no administration in time range)  ondansetron (ZOFRAN) tablet 4 mg (has no administration in time range)    Or  ondansetron (ZOFRAN) injection 4 mg (has no administration in time range)  cefTRIAXone (ROCEPHIN) 1 g in sodium chloride 0.9 % 100 mL IVPB (1 g Intravenous New Bag/Given 02/02/21 2218)  citalopram (CELEXA) tablet 10 mg (has no administration in time range)  simvastatin (ZOCOR) tablet 20 mg (0 mg Oral Hold 02/02/21 2228)  hydrochlorothiazide (HYDRODIURIL) tablet 25 mg (has no administration in time range)  propranolol (INDERAL) tablet 20 mg (0 mg Oral Hold 02/02/21 2232)  memantine (NAMENDA XR) 24 hr capsule 28 mg (0 mg Oral Hold 02/02/21 2233)    And  donepezil (ARICEPT) tablet 10 mg (10 mg Oral Not Given 02/02/21 2234)  lidocaine-EPINEPHrine-tetracaine (LET) topical gel (3 mLs Topical Given 02/02/21 1827)    ED Course  I have reviewed the triage vital signs and the nursing notes.  Pertinent labs & imaging results that were available during my care of the patient were reviewed by me and considered in my medical decision making (see chart for details).    MDM Rules/Calculators/A&P                           DANNELLE BARGERSTOCK is a 80 y.o. female with a past medical history significant for hypertension, dyslipidemia, gout, dementia, CKD, peripheral neuropathy, degenerative disease, carotid stenosis, diabetes, and fall yesterday with discovery of UTI who presents with another fall and head injury.  According to EMS report to nursing, patient was seen  yesterday and had a fall.  Work-up was reassuring aside from discovery of likely UTI.  Patient started antibiotics and discharged home however this afternoon had another fall.  Patient does not member what happened.  She reports he is having some mild headache in the back of her head where she  has a new laceration but otherwise denies significant pain in her arms, legs, or torso.  She denies significant neck pain.  She is alert and oriented x3 which is reportedly at her baseline.  Patient for me denies fevers, chills congestion, cough, nausea, vomiting, constipation, diarrhea, urinary changes.  On exam, lungs clear and chest nontender.  Back nontender.  Abdomen nontender.  Patient moving all extremities but does have some difficulty following finger-nose-finger testing due to some confusion.  Pupils are symmetric and reactive normal extract movements.  Clear speech.  Normal sensation throughout.  Patient has a 2 cm stellate laceration to her occiput with some abrasion as well.  Based on documentation at the seems that the abrasion was from yesterday and the laceration today is new.  Similar areas of injury.  Will get CT head and neck to make sure there is no significant traumatic injury and will get some basic labs as well as a UTI and 2 falls within 24 hours very some concern about the safety of her being at home.  Tetanus appears to be up-to-date per chart.  Anticipate reassessment after work-up.  Will order some let gel for the back of her head and after washing anticipate staples being placed for repair.  7:34 PM CT scans were just completed and unfortunately, patient does have new parafalcine subdural up to 8 mm which is new compared to yesterday.  Will call neurosurgery for consultation but I anticipate medicine will admit for repeat imaging and monitoring and UTI treatment and PT/OT for her falls.  7:53 PM Just spoke with Dr. Marcello Moores with neurosurgery who feels she should hold aspirin for 1  week and get a repeat head CT tomorrow morning to watch for worsened bleeding.  He does not think this will need surgery.  We will repair the laceration on the back of her head and call medicine for admission when her blood work is completed.   Final Clinical Impression(s) / ED Diagnoses Final diagnoses:  Subdural hematoma ([place])  Fall, initial encounter  Laceration of scalp, initial encounter      Clinical Impression: 1. Subdural hematoma ([place])   2. Fall, initial encounter   3. Laceration of scalp, initial encounter     Disposition: Admit  This note was prepared with assistance of Dragon voice recognition software. Occasional wrong-word or sound-a-like substitutions may have occurred due to the inherent limitations of voice recognition software.     Luba Matzen, Gwenyth Allegra, MD 02/02/21 2338    [place] Mill[place], Gwenyth Allegra, MD 02/02/21 [phone]

## 2021-02-03 ENCOUNTER — Other Ambulatory Visit: Payer: Self-pay

## 2021-02-03 ENCOUNTER — Inpatient Hospital Stay (HOSPITAL_COMMUNITY): Payer: Medicare Other

## 2021-02-03 DIAGNOSIS — N390 Urinary tract infection, site not specified: Secondary | ICD-10-CM | POA: Diagnosis present

## 2021-02-03 DIAGNOSIS — Z20822 Contact with and (suspected) exposure to covid-19: Secondary | ICD-10-CM | POA: Diagnosis present

## 2021-02-03 DIAGNOSIS — S065X2S Traumatic subdural hemorrhage with loss of consciousness of 31 minutes to 59 minutes, sequela: Secondary | ICD-10-CM | POA: Diagnosis not present

## 2021-02-03 DIAGNOSIS — Z7984 Long term (current) use of oral hypoglycemic drugs: Secondary | ICD-10-CM | POA: Diagnosis not present

## 2021-02-03 DIAGNOSIS — E1142 Type 2 diabetes mellitus with diabetic polyneuropathy: Secondary | ICD-10-CM | POA: Diagnosis present

## 2021-02-03 DIAGNOSIS — R748 Abnormal levels of other serum enzymes: Secondary | ICD-10-CM | POA: Diagnosis not present

## 2021-02-03 DIAGNOSIS — Z888 Allergy status to other drugs, medicaments and biological substances status: Secondary | ICD-10-CM | POA: Diagnosis not present

## 2021-02-03 DIAGNOSIS — B961 Klebsiella pneumoniae [K. pneumoniae] as the cause of diseases classified elsewhere: Secondary | ICD-10-CM | POA: Diagnosis present

## 2021-02-03 DIAGNOSIS — S065XAA Traumatic subdural hemorrhage with loss of consciousness status unknown, initial encounter: Secondary | ICD-10-CM | POA: Diagnosis present

## 2021-02-03 DIAGNOSIS — L89312 Pressure ulcer of right buttock, stage 2: Secondary | ICD-10-CM | POA: Diagnosis present

## 2021-02-03 DIAGNOSIS — Z833 Family history of diabetes mellitus: Secondary | ICD-10-CM | POA: Diagnosis not present

## 2021-02-03 DIAGNOSIS — Z809 Family history of malignant neoplasm, unspecified: Secondary | ICD-10-CM | POA: Diagnosis not present

## 2021-02-03 DIAGNOSIS — R402242 Coma scale, best verbal response, confused conversation, at arrival to emergency department: Secondary | ICD-10-CM | POA: Diagnosis present

## 2021-02-03 DIAGNOSIS — K719 Toxic liver disease, unspecified: Secondary | ICD-10-CM | POA: Diagnosis not present

## 2021-02-03 DIAGNOSIS — Z880 Allergy status to penicillin: Secondary | ICD-10-CM | POA: Diagnosis not present

## 2021-02-03 DIAGNOSIS — I6200 Nontraumatic subdural hemorrhage, unspecified: Secondary | ICD-10-CM | POA: Diagnosis not present

## 2021-02-03 DIAGNOSIS — M109 Gout, unspecified: Secondary | ICD-10-CM | POA: Diagnosis not present

## 2021-02-03 DIAGNOSIS — M10071 Idiopathic gout, right ankle and foot: Secondary | ICD-10-CM | POA: Diagnosis not present

## 2021-02-03 DIAGNOSIS — R7401 Elevation of levels of liver transaminase levels: Secondary | ICD-10-CM | POA: Diagnosis not present

## 2021-02-03 DIAGNOSIS — S065X0A Traumatic subdural hemorrhage without loss of consciousness, initial encounter: Secondary | ICD-10-CM | POA: Diagnosis present

## 2021-02-03 DIAGNOSIS — S065X0S Traumatic subdural hemorrhage without loss of consciousness, sequela: Secondary | ICD-10-CM | POA: Diagnosis not present

## 2021-02-03 DIAGNOSIS — R64 Cachexia: Secondary | ICD-10-CM | POA: Diagnosis present

## 2021-02-03 DIAGNOSIS — W1830XA Fall on same level, unspecified, initial encounter: Secondary | ICD-10-CM | POA: Diagnosis present

## 2021-02-03 DIAGNOSIS — Z981 Arthrodesis status: Secondary | ICD-10-CM | POA: Diagnosis not present

## 2021-02-03 DIAGNOSIS — G309 Alzheimer's disease, unspecified: Secondary | ICD-10-CM | POA: Diagnosis present

## 2021-02-03 DIAGNOSIS — S065X9A Traumatic subdural hemorrhage with loss of consciousness of unspecified duration, initial encounter: Principal | ICD-10-CM | POA: Diagnosis present

## 2021-02-03 DIAGNOSIS — F0390 Unspecified dementia without behavioral disturbance: Secondary | ICD-10-CM | POA: Diagnosis not present

## 2021-02-03 DIAGNOSIS — Z7982 Long term (current) use of aspirin: Secondary | ICD-10-CM | POA: Diagnosis not present

## 2021-02-03 DIAGNOSIS — K5901 Slow transit constipation: Secondary | ICD-10-CM | POA: Diagnosis present

## 2021-02-03 DIAGNOSIS — R1310 Dysphagia, unspecified: Secondary | ICD-10-CM | POA: Diagnosis present

## 2021-02-03 DIAGNOSIS — F0280 Dementia in other diseases classified elsewhere without behavioral disturbance: Secondary | ICD-10-CM | POA: Diagnosis present

## 2021-02-03 DIAGNOSIS — Z881 Allergy status to other antibiotic agents status: Secondary | ICD-10-CM | POA: Diagnosis not present

## 2021-02-03 DIAGNOSIS — Z8249 Family history of ischemic heart disease and other diseases of the circulatory system: Secondary | ICD-10-CM | POA: Diagnosis not present

## 2021-02-03 DIAGNOSIS — Y92009 Unspecified place in unspecified non-institutional (private) residence as the place of occurrence of the external cause: Secondary | ICD-10-CM | POA: Diagnosis not present

## 2021-02-03 DIAGNOSIS — J3489 Other specified disorders of nose and nasal sinuses: Secondary | ICD-10-CM | POA: Diagnosis not present

## 2021-02-03 DIAGNOSIS — R768 Other specified abnormal immunological findings in serum: Secondary | ICD-10-CM | POA: Diagnosis not present

## 2021-02-03 DIAGNOSIS — S065X2D Traumatic subdural hemorrhage with loss of consciousness of 31 minutes to 59 minutes, subsequent encounter: Secondary | ICD-10-CM | POA: Diagnosis not present

## 2021-02-03 DIAGNOSIS — R402362 Coma scale, best motor response, obeys commands, at arrival to emergency department: Secondary | ICD-10-CM | POA: Diagnosis present

## 2021-02-03 DIAGNOSIS — E669 Obesity, unspecified: Secondary | ICD-10-CM | POA: Diagnosis not present

## 2021-02-03 DIAGNOSIS — N1831 Chronic kidney disease, stage 3a: Secondary | ICD-10-CM | POA: Diagnosis present

## 2021-02-03 DIAGNOSIS — Z887 Allergy status to serum and vaccine status: Secondary | ICD-10-CM | POA: Diagnosis not present

## 2021-02-03 DIAGNOSIS — Z820 Family history of epilepsy and other diseases of the nervous system: Secondary | ICD-10-CM | POA: Diagnosis not present

## 2021-02-03 DIAGNOSIS — E1122 Type 2 diabetes mellitus with diabetic chronic kidney disease: Secondary | ICD-10-CM | POA: Diagnosis present

## 2021-02-03 DIAGNOSIS — R296 Repeated falls: Secondary | ICD-10-CM | POA: Diagnosis present

## 2021-02-03 DIAGNOSIS — R1312 Dysphagia, oropharyngeal phase: Secondary | ICD-10-CM | POA: Diagnosis not present

## 2021-02-03 DIAGNOSIS — R402142 Coma scale, eyes open, spontaneous, at arrival to emergency department: Secondary | ICD-10-CM | POA: Diagnosis present

## 2021-02-03 DIAGNOSIS — M852 Hyperostosis of skull: Secondary | ICD-10-CM | POA: Diagnosis not present

## 2021-02-03 DIAGNOSIS — M4802 Spinal stenosis, cervical region: Secondary | ICD-10-CM | POA: Diagnosis present

## 2021-02-03 DIAGNOSIS — N1832 Chronic kidney disease, stage 3b: Secondary | ICD-10-CM | POA: Diagnosis present

## 2021-02-03 DIAGNOSIS — I129 Hypertensive chronic kidney disease with stage 1 through stage 4 chronic kidney disease, or unspecified chronic kidney disease: Secondary | ICD-10-CM | POA: Diagnosis present

## 2021-02-03 DIAGNOSIS — S0101XA Laceration without foreign body of scalp, initial encounter: Secondary | ICD-10-CM | POA: Diagnosis present

## 2021-02-03 DIAGNOSIS — R7989 Other specified abnormal findings of blood chemistry: Secondary | ICD-10-CM | POA: Diagnosis not present

## 2021-02-03 DIAGNOSIS — Z66 Do not resuscitate: Secondary | ICD-10-CM | POA: Diagnosis present

## 2021-02-03 DIAGNOSIS — E119 Type 2 diabetes mellitus without complications: Secondary | ICD-10-CM | POA: Diagnosis not present

## 2021-02-03 DIAGNOSIS — I951 Orthostatic hypotension: Secondary | ICD-10-CM | POA: Diagnosis not present

## 2021-02-03 DIAGNOSIS — Z87891 Personal history of nicotine dependence: Secondary | ICD-10-CM | POA: Diagnosis not present

## 2021-02-03 DIAGNOSIS — E785 Hyperlipidemia, unspecified: Secondary | ICD-10-CM | POA: Diagnosis present

## 2021-02-03 DIAGNOSIS — S065X9D Traumatic subdural hemorrhage with loss of consciousness of unspecified duration, subsequent encounter: Secondary | ICD-10-CM | POA: Diagnosis not present

## 2021-02-03 DIAGNOSIS — E1169 Type 2 diabetes mellitus with other specified complication: Secondary | ICD-10-CM | POA: Diagnosis not present

## 2021-02-03 DIAGNOSIS — Z79899 Other long term (current) drug therapy: Secondary | ICD-10-CM | POA: Diagnosis not present

## 2021-02-03 LAB — BASIC METABOLIC PANEL
Anion gap: 12 (ref 5–15)
BUN: 14 mg/dL (ref 8–23)
CO2: 24 mmol/L (ref 22–32)
Calcium: 9.2 mg/dL (ref 8.9–10.3)
Chloride: 101 mmol/L (ref 98–111)
Creatinine, Ser: 1.06 mg/dL — ABNORMAL HIGH (ref 0.44–1.00)
GFR, Estimated: 53 mL/min — ABNORMAL LOW (ref >60)
Glucose, Bld: 141 mg/dL — ABNORMAL HIGH (ref 70–99)
Potassium: 3.8 mmol/L (ref 3.5–5.1)
Sodium: 137 mmol/L (ref 135–145)

## 2021-02-03 LAB — MRSA NEXT GEN BY PCR, NASAL: MRSA by PCR Next Gen: DETECTED — AB

## 2021-02-03 LAB — CBC
HCT: 34.6 % — ABNORMAL LOW (ref 36.0–46.0)
Hemoglobin: 10.7 g/dL — ABNORMAL LOW (ref 12.0–15.0)
MCH: 27.6 pg (ref 26.0–34.0)
MCHC: 30.9 g/dL (ref 30.0–36.0)
MCV: 89.2 fL (ref 80.0–100.0)
Platelets: 466 10*3/uL — ABNORMAL HIGH (ref 150–400)
RBC: 3.88 MIL/uL (ref 3.87–5.11)
RDW: 16.2 % — ABNORMAL HIGH (ref 11.5–15.5)
WBC: 11.4 10*3/uL — ABNORMAL HIGH (ref 4.0–10.5)
nRBC: 0 % (ref 0.0–0.2)

## 2021-02-03 MED ORDER — MUPIROCIN 2 % EX OINT
TOPICAL_OINTMENT | Freq: Two times a day (BID) | CUTANEOUS | Status: DC
  Administered 2021-02-06: 1 via NASAL
  Filled 2021-02-03 (×2): qty 22

## 2021-02-03 NOTE — Progress Notes (Signed)
Called [name] ( pts daughter) and updated her on her mom.

## 2021-02-03 NOTE — ED Notes (Signed)
Pt resting quietly in bed with eyes closed. Respirations even and unlabored. No S/S of distress noted.

## 2021-02-03 NOTE — ED Notes (Signed)
Pt transported to CT via stretcher at this time.  

## 2021-02-03 NOTE — Consult Note (Signed)
CC: s/p fall  HPI:     Patient is a 80 y.o. female with dementia who presents after a fall with evidence of head trauma.  She has had numerous falls recently.  She currently has minimal headache and appears to be at her neurologic baseline, with significant confusion.  She was taking aspirin at home.    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   Corns and callus 07/27/2020   Chronic renal disease, stage 3, moderately decreased glomerular filtration rate (GFR) between 30-59 mL/min/1.73 square meter ([place]) 09/26/2018   Senile dementia without behavioral disturbance ([place]) 03/21/2017   Atherosclerosis of native artery of both lower extremities with intermittent claudication ([place]) 11/24/2015   Cervical stenosis of spinal canal 12/22/2014   Visit for screening mammogram 09/16/2014   Routine health maintenance 01/03/2011   Peripheral neuropathy ([place]) 08/23/2010   DM (diabetes mellitus) type II controlled, neurological manifestation ([place]) 07/21/2010   Carotid artery stenosis 12/15/2009   DEGENERATIVE [place] DISEASE, LUMBOSACRAL SPINE W/RADICULOPATHY 12/29/2008   Dyslipidemia, goal LDL below 70 02/16/2007   Gout 02/16/2007   Essential hypertension 02/16/2007   Past Medical History:  Diagnosis Date   Contact dermatitis and other eczema, due to unspecified cause    Displacement of lumbar intervertebral disc without myelopathy    FH: cataracts    Gout, unspecified    Memory loss    Occlusion and stenosis of carotid artery without mention of cerebral infarction    Other and unspecified hyperlipidemia    Pain in joint, shoulder region    Pain in limb    Routine general medical examination at a health care  facility    Sciatica    Type II or unspecified type diabetes mellitus without mention of complication, uncontrolled    Unspecified essential hypertension    Unspecified venous (peripheral) insufficiency     Past Surgical History:  Procedure Laterality Date   ANTERIOR CERVICAL DECOMP/DISCECTOMY FUSION N/A 12/22/2014   Procedure: Anterior Cervical Four-Five/Five-Six/Six-Seven Decompression/Diskectomy/ and Fsion;  Surgeon: Leeroy Cha, MD;  Location: [facility];  Service: Neurosurgery;  Laterality: N/A;  C4-5 C5-6 C6-7 Anterior cervical decompression/diskectomy/fusion   LAPAROSCOPIC APPENDECTOMY     LUMBAR LAMINECTOMY  07/2008   [place]   TUBAL LIGATION      (Not in a hospital admission)  Allergies  Allergen Reactions   Pneumococcal Vaccines Swelling    Per pt when received 2015   Wound Dressing Adhesive Other (See Comments)    Band-Aids will PULL OFF THE SKIN if left on for a decent amount of time   Ampicillin Hives   Neomycin-Bacitracin Zn-Polymyx Rash   Penicillins Hives    Social History   Tobacco Use   Smoking status: Former    Types: Cigarettes    Quit date: 05/23/1995    Years since quitting: 25.7   Smokeless tobacco: Never  Substance Use Topics   Alcohol use: No    Family History  Problem Relation Age of Onset   Alzheimer's disease Mother    Diabetes Mother    Coronary artery disease Father        CABG   Hypertension Father    Cancer Brother    Cancer Brother    Colon cancer Neg Hx  Breast cancer Neg Hx      Review of Systems Review of systems not obtained due to patient factors.  Objective:   Patient Vitals for the past 8 hrs:  BP Pulse Resp SpO2  02/03/21 1200 (!) 142/57 65 (!) 22 96 %  02/03/21 0937 (!) 134/51 72 19 99 %  02/03/21 0910 -- 77 18 99 %  02/03/21 0800 (!) 152/86 88 (!) 21 98 %  02/03/21 0712 120/87 86 (!) 23 96 %   No intake/output data recorded. No intake/output data recorded.    NAD Small abrasion on scalp Awake, alert,  oriented to person only.  Eyes open spontaneously FC x 4, no drift  Data ReviewCBC:  Lab Results  Component Value Date   WBC 11.4 (H) 02/03/2021   RBC 3.88 02/03/2021   BMP:  Lab Results  Component Value Date   GLUCOSE 141 (H) 02/03/2021   CO2 24 02/03/2021   BUN 14 02/03/2021   CREATININE 1.06 (H) 02/03/2021   CALCIUM 9.2 02/03/2021   Radiology review:   Initial CT reviewed and shows small falcine SDH.  Her repeat CT head performed this morning shows no change.  Assessment:   Principal Problem:   Subdural hematoma, post-traumatic (HCC) Active Problems:   Essential hypertension   DM (diabetes mellitus) type II controlled, neurological manifestation ([facility])   Senile dementia without behavioral disturbance ([facility])   Chronic renal disease, stage 3, moderately decreased glomerular filtration rate (GFR) between 30-59 mL/min/1.73 square meter (H[facility])   Recurrent falls   Laceration of occipital region of scalp   Subdural hematoma ([place])  This is a 80 year old woman with dementia and frequent falls who had a recent fall with a small falcine subdural hematoma which is stable on repeat imaging. Plan:   These falcine subdural hematomas rarely ever require surgical intervention.  As hers has demonstrated stability, no further imaging is required.  I would recommend holding aspirin for 1 week.  She can follow-up with her primary care physician.

## 2021-02-03 NOTE — Progress Notes (Signed)
Inpatient Rehab Admissions Coordinator :  Per noted diagnosis of SDH and good family support, patient was screened for CIR candidacy by Danne Baxter RN MSN.  At this time patient appears to be a potential candidate for CIR. I will place a rehab consult per protocol for full assessment. Please call me with any questions.  Danne Baxter RN MSN Admissions Coordinator [phone]

## 2021-02-03 NOTE — ED Notes (Signed)
PT assessing pt at this time.

## 2021-02-03 NOTE — ED Notes (Signed)
Linens changed. Brief and pads placed  under pt.

## 2021-02-03 NOTE — Evaluation (Signed)
Occupational Therapy Evaluation Patient Details Name: [name] MRN: [id number] DOB: 28-Nov-1940 Today's Date: 02/03/2021   History of Present Illness Pt is a 80yo female presenting to [facility] ED on 9/14 after a fall where she hit her head. CT revealed subdural hematoma on the R. She came to [facility] ED on 9/13 after a different fall and was discharged. PMH: HTN, DM, dementia, history of falling, cervical spine fusion C4-C7.   Clinical Impression   Patient was admitted for a fall at home with above resulting injury.  PTA, per the chart, patient struggles with baseline cognitive dysfunction, walks with a RW, noted recent falls, and most likely needs assist with ADLs at baseline.  Deficits are listed below.  Currently she is needing up to [place] for basic in room mobility, and up to Mod A for lower body ADL.  OT will follow in the acute setting to maximize her functional status.  Discharge disposition is really up to the family.  She could benefit from a short term rehab stay at a local SNF to address her recent falls, but if family can provide 24 hour assist as needed, [place] could be considered.        Recommendations for follow up therapy are one component of a multi-disciplinary discharge planning process, led by the attending physician.  Recommendations may be updated based on patient status, additional functional criteria and insurance authorization.   Follow Up Recommendations  SNF;Home health OT    Equipment Recommendations  None recommended by OT    Recommendations for Other Services       Precautions / Restrictions Precautions Precautions: Fall Precaution Comments: Multiple recent falls. Restrictions Weight Bearing Restrictions: No      Mobility Bed Mobility Overal bed mobility: Needs Assistance Bed Mobility: Supine to Sit;Sit to Supine     Supine to sit: Min assist;HOB elevated Sit to supine: Mod assist   General bed mobility comments: Pt required mod assist with HOB elevated for  trunk control to raise to sitting EOB, with additional assist provided to scoot hips forward. Pt required mod assist for sit to supine to bring LEs back to bed and lower trunk slow and controlled. Max multimodal cuing with simple commands. Patient Response: Cooperative  Transfers Overall transfer level: Needs assistance Equipment used: 1 person hand held assist Transfers: Sit to/from [facility] Sit to Stand: Min assist Stand pivot transfers: Min assist       General transfer comment: Pt required min assist for sit to stand transfer for steadying. Pt min guard for stand pivot transfer to [facility]. Max multimodal cuing for sequencing.    Balance Overall balance assessment: Needs assistance;History of Falls Sitting-balance support: Feet supported;Single extremity supported Sitting balance-Leahy Scale: Fair   Postural control: Posterior lean Standing balance support: Bilateral upper extremity supported;During functional activity Standing balance-Leahy Scale: Poor Standing balance comment: Pt reliant on BUE support on RW during static and dynamic functional tasks.                           ADL either performed or assessed with clinical judgement   ADL Overall ADL's : Needs assistance/impaired     Grooming: Wash/dry hands;Wash/dry face;Set up;Sitting   Upper Body Bathing: Supervision/ safety;Sitting   Lower Body Bathing: Moderate assistance;Sit to/from stand   Upper Body Dressing : Minimal assistance;Sitting   Lower Body Dressing: Moderate assistance;Sit to/from stand   Toilet Transfer: Minimal assistance;BSC;Ambulation Armed forces technical officer Details (indicate cue type  and reason): HHA         Functional mobility during ADLs: Minimal assistance       Vision Patient Visual Report: Blurring of vision;No change from baseline       Perception  NT   Praxis  NT    Pertinent Vitals/Pain Pain Assessment: Faces Faces Pain Scale: Hurts a little bit Pain  Location: Head Pain Descriptors / Indicators: Headache Pain Intervention(s): Monitored during session     Hand Dominance Right   Extremity/Trunk Assessment Upper Extremity Assessment Upper Extremity Assessment: Generalized weakness   Lower Extremity Assessment Lower Extremity Assessment: Defer to PT evaluation RLE Deficits / Details: BLE scaling noted on calves. Pt has limited ROM, likely secondary to generalized weakness. LLE Deficits / Details: BLE scaling noted on calves. Pt has limited ROM, likely secondary to generalized weakness.   Cervical / Trunk Assessment Cervical / Trunk Assessment: Kyphotic;Other exceptions Cervical / Trunk Exceptions: s/p C4-C7 fusion   Communication Communication Communication: No difficulties   Cognition Arousal/Alertness: Awake/alert Behavior During Therapy: Flat affect Overall Cognitive Status: History of cognitive impairments - at baseline                                   General Comments   VSS    Exercises     Shoulder Instructions      Home Living Family/patient expects to be discharged to:: Private residence Living Arrangements: Spouse/significant other Available Help at Discharge: Family;Available 24 hours/day Type of Home: House Home Access: Stairs to enter [employer] of Steps: 2 Entrance Stairs-Rails: None Home Layout: One level     Bathroom Shower/Tub: Teacher, early years/pre: Standard Bathroom Accessibility: No   Home Equipment: Environmental consultant - 2 wheels;Cane - single point;Shower seat          Prior Functioning/Environment Level of Independence: Needs assistance  Gait / Transfers Assistance Needed: chart suggests patient was supposed to walk with a RW ADL's / Homemaking Assistance Needed: Assume assist with home management, meds, meal prep, community mobility and ADL support            OT Problem List: Decreased strength;Decreased activity tolerance;Impaired balance (sitting  and/or standing);Decreased safety awareness;Decreased cognition;Pain      OT Treatment/Interventions: Self-care/ADL training;Therapeutic exercise;DME and/or AE instruction;Therapeutic activities    OT Goals(Current goals can be found in the care plan section) Acute Rehab OT Goals Patient Stated Goal: none stated OT Goal Formulation: Patient unable to participate in goal setting Time For Goal Achievement: 02/17/21  OT Frequency: Min 2X/week   Barriers to D/C:    none noted       Co-evaluation              AM-PAC OT "6 Clicks" Daily Activity     Outcome Measure Help from another person eating meals?: None Help from another person taking care of personal grooming?: None Help from another person toileting, which includes using toliet, bedpan, or urinal?: A Little Help from another person bathing (including washing, rinsing, drying)?: A Lot Help from another person to put on and taking off regular upper body clothing?: A Little Help from another person to put on and taking off regular lower body clothing?: A Lot 6 Click Score: 18   End of Session Equipment Utilized During Treatment: Gait belt Nurse Communication: Mobility status  Activity Tolerance: Patient tolerated treatment well Patient left: in bed;with call bell/phone within reach  OT Visit Diagnosis:  Unsteadiness on feet (R26.81);Muscle weakness (generalized) (M62.81);Pain                Time: 1015-1035 OT Time Calculation (min): 20 min Charges:  OT General Charges $OT Visit: 1 Visit OT Evaluation $OT Eval Moderate Complexity: 1 Mod  02/03/2021  RP, OTR/L  Acute Rehabilitation Services  Office:  [phone]   Metta Clines 02/03/2021, 10:46 AM

## 2021-02-03 NOTE — Progress Notes (Signed)
PROGRESS NOTE   [name]  [id number] DOB: 01-19-1941 DOA: 02/02/2021 PCP: Janith Lima, MD  Brief Narrative:  80 year old home dwelling white female-supposed to ambulate with walker known history of Alzheimer's dementia DM TY 2 HTN CKD 3B-- initial fall 9/13-came to ED-CT showed (scalp hematoma CT spine   [-] patient was started on cefdinir for suspected urinary tract infection Return 9/14 2/2 repeat fall hit back of head in addition Repeat CT head = 8 mm right parafalcine subdural hematoma left scalp hematoma 12 mm without fracture multilevel severe degenerative changes noted on CT neck  Hospital-Problem based course  Recurrent fall with 8 mm right parafalcine subdural hematoma, 12 mm left scalp hematoma without fracture Multiple falls indicate risk of further issues-neurosurgery is recommending holding aspirin for 1 week Follow repeat CT scan 9/15 to compare we will obtain therapy services input with regards to best plan for discharge and safety Await therapy eval She is also awaiting speech eval-failed swallow testing UTI from prior to admission Urine cultures 9/13 growing >100,000 g negative rods Continue ceftriaxone until results Hypertension Unclear if orthostasis played a role-would hold HCTZ 25, a Velcro 150 can continue propanolol 20 twice daily for now Moderately controlled DM TY 2 Resume metformin 500 twice daily a.m. 9/16 when able to eat--can resume Zocor Moderate to severe dementia Continue Namzaric (memantine/donepezil) 1 capsule at bedtime, continue Celexa 10 qd Is able to orient mainly to person cannot orient to place etc.    DVT prophylaxis: SCD Code Status: DNR Family Communication: called husband [phone]--no response.  Called daughter #, [name] [phone] and updated Disposition:  Status is: Observation  The patient will require care spanning > 2 midnights and should be moved to inpatient because: Hemodynamically unstable and Unsafe d/c  plan  Dispo: The patient is from: Home              Anticipated d/c is to:  To be discussed              Patient currently is not medically stable to d/c.   Difficult to place patient No  Consultants:  Neurosurgery was telephone consulted on 9/14 by the ED  Procedures:   Antimicrobials:   ceftriaxone  Subjective: Awake coherent no distress unable to tell me place time can tell me her name Has not eaten nursing reports she is still n.p.o. because she failed swallow  Objective: Vitals:   02/03/21 0030 02/03/21 0230 02/03/21 0400 02/03/21 0500  BP: (!) 146/55 (!) 156/68 (!) 158/60 (!) 153/60  Pulse: 96 95 87 83  Resp: (!) 22 19 (!) 21 (!) 21  Temp:      TempSrc:      SpO2: 98% 97% 96% 96%   No intake or output data in the 24 hours ending 02/03/21 0700 There were no vitals filed for this visit.  Examination:  Cachectic frail white female no distress EOMI NCAT very dry mucosa Smile symmetric Neck soft supple CTA B no rales no rhonchi S1-S2 no murmur no rub no gallop Abdomen soft no rebound no guarding ROM intact able to lift legs off bed and off bed power seems to be 5/5 I did not test sensory She has scaly/swollen lower extremities   Data Reviewed: personally reviewed   CBC    Component Value Date/Time   WBC 11.4 (H) 02/03/2021 0516   RBC 3.88 02/03/2021 0516   HGB 10.7 (L) 02/03/2021 0516   HCT 34.6 (L) 02/03/2021 0516   PLT 466 (H) 02/03/2021  0516   MCV 89.2 02/03/2021 0516   MCH 27.6 02/03/2021 0516   MCHC 30.9 02/03/2021 0516   RDW 16.2 (H) 02/03/2021 0516   LYMPHSABS 2.1 02/02/2021 2130   MONOABS 0.9 02/02/2021 2130   EOSABS 0.2 02/02/2021 2130   BASOSABS 0.0 02/02/2021 2130   CMP Latest Ref Rng & Units 02/03/2021 02/02/2021 02/01/2021  Glucose 70 - 99 mg/dL 141(H) 135(H) 154(H)  BUN 8 - 23 mg/dL '14 13 19  '$ Creatinine 0.44 - 1.00 mg/dL 1.06(H) 1.04(H) 1.03(H)  Sodium 135 - 145 mmol/L 137 136 135  Potassium 3.5 - 5.1 mmol/L 3.8 3.8 4.8  Chloride 98 -  111 mmol/L 101 102 102  CO2 22 - 32 mmol/L '24 25 24  '$ Calcium 8.9 - 10.3 mg/dL 9.2 9.1 8.8(L)  Total Protein 6.5 - 8.1 g/dL - 6.4(L) -  Total Bilirubin 0.3 - 1.2 mg/dL - 0.9 -  Alkaline Phos 38 - 126 U/L - 228(H) -  AST 15 - 41 U/L - 36 -  ALT 0 - 44 U/L - 43 -     Radiology Studies: CT HEAD WO CONTRAST (5MM)  Result Date: 02/02/2021 CLINICAL DATA:  Recurrent fall, new occipital laceration. EXAM: CT HEAD WITHOUT CONTRAST CT CERVICAL SPINE WITHOUT CONTRAST TECHNIQUE: Multidetector CT imaging of the head and cervical spine was performed following the standard protocol without intravenous contrast. Multiplanar CT image reconstructions of the cervical spine were also generated. COMPARISON:  CT head and cervical spine 02/01/2021 FINDINGS: CT HEAD FINDINGS BRAIN: BRAIN Patchy and confluent areas of decreased attenuation are noted throughout the deep and periventricular white matter of the cerebral hemispheres bilaterally, compatible with chronic microvascular ischemic disease. No evidence of large-territorial acute infarction. No parenchymal hemorrhage. No mass lesion. Interval development of a right parafalcine hyperdense extra-axial fluid extending along the right tentorium. On coronal this measures up to 8 mm along the falx cerebral (4:28). No mass effect or midline shift. No hydrocephalus. Basilar cisterns are patent. Vascular: No hyperdense vessel. Atherosclerotic calcifications are present within the cavernous internal carotid arteries. Skull: No acute fracture or focal lesion. Sinuses/Orbits: Paranasal sinuses and mastoid air cells are clear. The orbits are unremarkable. Other: Left occipital scalp 12 mm hematoma with associated subcutaneus soft tissue edema and emphysema. No retained radiopaque foreign body. CT CERVICAL SPINE FINDINGS Alignment: Grade 1 anterolisthesis of C3 on C4. Skull base and vertebrae: C4 through C7 anterior and interbody fusion. Multilevel severe degenerative changes of the  spine. Severe bilateral osseous neural foraminal stenosis at the C3-C4 level. No acute fracture. No aggressive appearing focal osseous lesion or focal pathologic process. Soft tissues and spinal canal: No prevertebral fluid or swelling. No visible canal hematoma. Upper chest: Unremarkable. Other: None. IMPRESSION: 1. An 8 mm right parafalcine subdural hemorrhage extending inferiorly along the right tentorium. 2. Left occipital scalp 12 mm hematoma with no underlying calvarial fracture. 3. No acute displaced fracture or traumatic listhesis of the cervical spine. 4. C4 - C7 anterior and interbody fusion. Multilevel severe degenerative changes of the spine with severe bilateral osseous neural foraminal stenosis at the C3-C4 level. These results were called by telephone at the time of interpretation on 02/02/2021 at 7:26 pm to provider [facility] , who verbally acknowledged these results. Electronically Signed   By: Iven Finn M.D.   On: 02/02/2021 19:29   CT Head Wo Contrast  Result Date: 02/01/2021 CLINICAL DATA:  Head and neck trauma. EXAM: CT HEAD WITHOUT CONTRAST CT CERVICAL SPINE WITHOUT CONTRAST TECHNIQUE: Multidetector CT imaging  of the head and cervical spine was performed following the standard protocol without intravenous contrast. Multiplanar CT image reconstructions of the cervical spine were also generated. COMPARISON:  December 16, 2020. FINDINGS: CT HEAD FINDINGS Brain: Mild chronic ischemic white matter disease is noted. No mass effect or midline shift is noted. Ventricular size is within normal limits. There is no evidence of mass lesion, hemorrhage or acute infarction. Vascular: No hyperdense vessel or unexpected calcification. Skull: Normal. Negative for fracture or focal lesion. Sinuses/Orbits: No acute finding. Other: Small left posterior scalp hematoma is noted. CT CERVICAL SPINE FINDINGS Alignment: Mild grade 1 anterolisthesis of C3-4 is noted secondary to posterior facet joint  hypertrophy. Skull base and vertebrae: No acute fracture. No primary bone lesion or focal pathologic process. Soft tissues and spinal canal: No prevertebral fluid or swelling. No visible canal hematoma. Disc levels: Status post surgical anterior fusion of C4-5, C5-6 and C6-7. Severe degenerative disc disease is noted at C3-4 and C7-T1. Upper chest: Negative. Other: None. IMPRESSION: Small left posterior scalp hematoma. No acute intracranial abnormality seen. Postsurgical and degenerative changes are noted in the cervical spine as described above. No acute abnormality is noted. Electronically Signed   By: Marijo Conception M.D.   On: 02/01/2021 15:00   CT Cervical Spine Wo Contrast  Result Date: 02/02/2021 CLINICAL DATA:  Recurrent fall, new occipital laceration. EXAM: CT HEAD WITHOUT CONTRAST CT CERVICAL SPINE WITHOUT CONTRAST TECHNIQUE: Multidetector CT imaging of the head and cervical spine was performed following the standard protocol without intravenous contrast. Multiplanar CT image reconstructions of the cervical spine were also generated. COMPARISON:  CT head and cervical spine 02/01/2021 FINDINGS: CT HEAD FINDINGS BRAIN: BRAIN Patchy and confluent areas of decreased attenuation are noted throughout the deep and periventricular white matter of the cerebral hemispheres bilaterally, compatible with chronic microvascular ischemic disease. No evidence of large-territorial acute infarction. No parenchymal hemorrhage. No mass lesion. Interval development of a right parafalcine hyperdense extra-axial fluid extending along the right tentorium. On coronal this measures up to 8 mm along the falx cerebral (4:28). No mass effect or midline shift. No hydrocephalus. Basilar cisterns are patent. Vascular: No hyperdense vessel. Atherosclerotic calcifications are present within the cavernous internal carotid arteries. Skull: No acute fracture or focal lesion. Sinuses/Orbits: Paranasal sinuses and mastoid air cells are  clear. The orbits are unremarkable. Other: Left occipital scalp 12 mm hematoma with associated subcutaneus soft tissue edema and emphysema. No retained radiopaque foreign body. CT CERVICAL SPINE FINDINGS Alignment: Grade 1 anterolisthesis of C3 on C4. Skull base and vertebrae: C4 through C7 anterior and interbody fusion. Multilevel severe degenerative changes of the spine. Severe bilateral osseous neural foraminal stenosis at the C3-C4 level. No acute fracture. No aggressive appearing focal osseous lesion or focal pathologic process. Soft tissues and spinal canal: No prevertebral fluid or swelling. No visible canal hematoma. Upper chest: Unremarkable. Other: None. IMPRESSION: 1. An 8 mm right parafalcine subdural hemorrhage extending inferiorly along the right tentorium. 2. Left occipital scalp 12 mm hematoma with no underlying calvarial fracture. 3. No acute displaced fracture or traumatic listhesis of the cervical spine. 4. C4 - C7 anterior and interbody fusion. Multilevel severe degenerative changes of the spine with severe bilateral osseous neural foraminal stenosis at the C3-C4 level. These results were called by telephone at the time of interpretation on 02/02/2021 at 7:26 pm to provider [facility] , who verbally acknowledged these results. Electronically Signed   By: Iven Finn M.D.   On: 02/02/2021 19:29  CT Cervical Spine Wo Contrast  Result Date: 02/01/2021 CLINICAL DATA:  Head and neck trauma. EXAM: CT HEAD WITHOUT CONTRAST CT CERVICAL SPINE WITHOUT CONTRAST TECHNIQUE: Multidetector CT imaging of the head and cervical spine was performed following the standard protocol without intravenous contrast. Multiplanar CT image reconstructions of the cervical spine were also generated. COMPARISON:  December 16, 2020. FINDINGS: CT HEAD FINDINGS Brain: Mild chronic ischemic white matter disease is noted. No mass effect or midline shift is noted. Ventricular size is within normal limits. There is no  evidence of mass lesion, hemorrhage or acute infarction. Vascular: No hyperdense vessel or unexpected calcification. Skull: Normal. Negative for fracture or focal lesion. Sinuses/Orbits: No acute finding. Other: Small left posterior scalp hematoma is noted. CT CERVICAL SPINE FINDINGS Alignment: Mild grade 1 anterolisthesis of C3-4 is noted secondary to posterior facet joint hypertrophy. Skull base and vertebrae: No acute fracture. No primary bone lesion or focal pathologic process. Soft tissues and spinal canal: No prevertebral fluid or swelling. No visible canal hematoma. Disc levels: Status post surgical anterior fusion of C4-5, C5-6 and C6-7. Severe degenerative disc disease is noted at C3-4 and C7-T1. Upper chest: Negative. Other: None. IMPRESSION: Small left posterior scalp hematoma. No acute intracranial abnormality seen. Postsurgical and degenerative changes are noted in the cervical spine as described above. No acute abnormality is noted. Electronically Signed   By: Marijo Conception M.D.   On: 02/01/2021 15:00     Scheduled Meds:  citalopram  10 mg Oral Daily   memantine  28 mg Oral QHS   And   donepezil  10 mg Oral QHS   hydrochlorothiazide  25 mg Oral Daily   propranolol  20 mg Oral BID   simvastatin  20 mg Oral QPM   Continuous Infusions:  cefTRIAXone (ROCEPHIN)  IV Stopped (02/03/21 0052)     LOS: 0 days   Time spent: [place], MD [facility] To contact the attending provider between 7A-7P or the covering provider during after hours 7P-7A, please log into the web site [email or web address] and access using universal [place] password for that web site. If you do not have the password, please call the hospital operator.  02/03/2021, 7:00 AM

## 2021-02-03 NOTE — Evaluation (Signed)
Clinical/Bedside Swallow Evaluation Patient Details  Name: [name] MRN: [id number] Date of Birth: Sep 17, 1940  Today's Date: 02/03/2021 Time: SLP Start Time (ACUTE ONLY): 63 SLP Stop Time (ACUTE ONLY): 1059 SLP Time Calculation (min) (ACUTE ONLY): 22 min  Past Medical History:  Past Medical History:  Diagnosis Date   Contact dermatitis and other eczema, due to unspecified cause    Displacement of lumbar intervertebral disc without myelopathy    FH: cataracts    Gout, unspecified    Memory loss    Occlusion and stenosis of carotid artery without mention of cerebral infarction    Other and unspecified hyperlipidemia    Pain in joint, shoulder region    Pain in limb    Routine general medical examination at a health care facility    Sciatica    Type II or unspecified type diabetes mellitus without mention of complication, uncontrolled    Unspecified essential hypertension    Unspecified venous (peripheral) insufficiency    Past Surgical History:  Past Surgical History:  Procedure Laterality Date   ANTERIOR CERVICAL DECOMP/DISCECTOMY FUSION N/A 12/22/2014   Procedure: Anterior Cervical Four-Five/Five-Six/Six-Seven Decompression/Diskectomy/ and Fsion;  Surgeon: Leeroy Cha, MD;  Location: [facility];  Service: Neurosurgery;  Laterality: N/A;  C4-5 C5-6 C6-7 Anterior cervical decompression/diskectomy/fusion   LAPAROSCOPIC APPENDECTOMY     LUMBAR LAMINECTOMY  07/2008   [place]   TUBAL LIGATION     HPI:  Pt is an 80 year old female with recurrent fall with 8 mm right parafalcine subdural hematoma, 12 mm left scalp hematoma without fracture  Multiple falls indicate risk of further issues-neurosurgery is recommending holding aspirin for 1 week  Follow repeat CT scan 9/15 to compare.  Therapy services input with regards to best plan for discharge and safety. Pt failed swallow screen.    Assessment / Plan / Recommendation  Clinical Impression  Pt demonstrates no signs of  aspiration, tolerates liquids well. Pt did have some cogntive impairment that required cueing and encouragement for her to accept POs trials, particularly solids. Pt would initially only take very small nibbles. Eventually she demonstrated slow, slighlty prolonged masticaiton. Recommend a Dys 2/thin liquid diet, will f/u to potentially upgrade as appropriate. SLP Visit Diagnosis: Dysphagia, unspecified (R13.10)    Aspiration Risk  Mild aspiration risk    Diet Recommendation Dysphagia 2 (Fine chop);Thin liquid   Liquid Administration via: Cup;Straw Medication Administration: Crushed with puree Supervision: Full supervision/cueing for compensatory strategies Compensations: Minimize environmental distractions;Slow rate;Small sips/bites Postural Changes: Seated upright at 90 degrees    Other  Recommendations      Recommendations for follow up therapy are one component of a multi-disciplinary discharge planning process, led by the attending physician.  Recommendations may be updated based on patient status, additional functional criteria and insurance authorization.  Follow up Recommendations Skilled Nursing facility      Frequency and Duration min 2x/week  2 weeks       Prognosis Prognosis for Safe Diet Advancement: Good Barriers to Reach Goals: Cognitive deficits      Swallow Study   General HPI: Pt is an 80 year old female with recurrent fall with 8 mm right parafalcine subdural hematoma, 12 mm left scalp hematoma without fracture  Multiple falls indicate risk of further issues-neurosurgery is recommending holding aspirin for 1 week  Follow repeat CT scan 9/15 to compare.  Therapy services input with regards to best plan for discharge and safety. Pt failed swallow screen. Type of Study: Bedside Swallow Evaluation Previous  Swallow Assessment: none Diet Prior to this Study: NPO Temperature Spikes Noted: No Respiratory Status: Room air History of Recent Intubation:  No Behavior/Cognition: Alert;Distractible;Requires cueing Oral Cavity Assessment:  (dry lips) Oral Care Completed by SLP: No Oral Cavity - Dentition: Adequate natural dentition Vision: Functional for self-feeding Self-Feeding Abilities: Needs assist Patient Positioning: Upright in bed Baseline Vocal Quality: Normal Volitional Cough: Cognitively unable to elicit    Oral/Motor/Sensory Function Overall Oral Motor/Sensory Function: Within functional limits   Ice Chips     Thin Liquid Thin Liquid: Within functional limits    Nectar Thick Nectar Thick Liquid: Not tested   Honey Thick Honey Thick Liquid: Not tested   Puree Puree: Within functional limits   Solid     Solid: Impaired Oral Phase Functional Implications: Prolonged oral transit      Veleta Yamamoto, Katherene Ponto 02/03/2021,12:11 PM

## 2021-02-03 NOTE — Progress Notes (Signed)
PHYSICAL THERAPY EVALUATION     02/03/21 0849  PT Visit Information  Last PT Received On 02/03/21  Assistance Needed +1  History of Present Illness Pt is a 80yo female presenting to [facility] ED on 9/14 after a fall where she hit her head. CT revealed R parafalcine subdural hematoma. She came to [facility] ED on 9/13 after a different fall and was discharged. PMH: HTN, DM, dementia, history of falling, cervical spine fusion C4-C7.  Precautions  Precautions Fall  Precaution Comments Multiple recent falls.  Restrictions  Weight Bearing Restrictions No  Home Living  Family/patient expects to be discharged to: Private residence  Living Arrangements Spouse/significant other  Available Help at Discharge Family;Available 24 hours/day (Daughter helps in the AM, Son in the PM, Husband covers night shift.)  Type of [place] to enter  [employer] of Steps 2  Entrance Stairs-Rails None  Home Layout One level  Bathroom Biomedical scientist No  Home Herbalist - 2 wheels;Cane - single point;Shower seat  Prior Function  Level of Independence Needs assistance  Gait / Transfers Assistance Needed Pt has cane and RW but is resistant to using them and has to be reminded.  ADL's / Homemaking Assistance Needed Family provides bathing and bathroom supervision, cooks food, and supervises dressing. Pt feeds herself.  Communication  Communication No difficulties  Pain Assessment  Pain Assessment Faces  Faces Pain Scale 4  Pain Location Head  Pain Descriptors / Indicators Headache  Pain Intervention(s) Monitored during session;Repositioned  Cognition  Arousal/Alertness Awake/alert  Behavior During Therapy Flat affect  Overall Cognitive Status No family/caregiver present to determine baseline cognitive functioning  General Comments Pt has history of dementia, oriented to self only. Pt able to follow basic/simple one step  commands, but inconsistently.  Trouble sequencing.  Upper Extremity Assessment  Upper Extremity Assessment Defer to OT evaluation  Lower Extremity Assessment  Lower Extremity Assessment RLE deficits/detail;LLE deficits/detail;Generalized weakness  RLE Deficits / Details BLE scaling noted on calves. Pt has limited ROM, likely secondary to generalized weakness.  LLE Deficits / Details BLE scaling noted on calves. Pt has limited ROM, likely secondary to generalized weakness.  Cervical / Trunk Assessment  Cervical / Trunk Assessment Kyphotic  Bed Mobility  Overal bed mobility Needs Assistance  Bed Mobility Supine to Sit;Sit to Supine  Supine to sit HOB elevated;Mod assist  Sit to supine Mod assist  General bed mobility comments Pt required mod assist with HOB elevated for trunk control to raise to sitting, with additional assist provided to scoot hips forward. Pt required mod assist for sit to supine to bring LEs back to bed and lower trunk slow and controlled. Max multimodal cuing.  Transfers  Overall transfer level Needs assistance  Equipment used Rolling walker (2 wheeled)  Transfers Sit to/from [facility]  Sit to Stand Min assist  Stand pivot transfers Min guard  General transfer comment Pt required min assist for sit to stand transfer for steadying. Pt min guard for stand pivot transfer to [facility]. Max multimodal cuing for sequencing. Cues for hand placement, however, pt continued to pull up from RW.  Ambulation/Gait  Ambulation/Gait assistance Min guard  Gait Distance (Feet) 2 Feet  Assistive device Rolling walker (2 wheeled)  Gait Pattern/deviations Step-to pattern;Decreased step length - right  General Gait Details Pt ambulated from [facility] to EOB and demonstrated markedly decreased step length on the right. No overt LOB; min guard for  safety and max cuing for sequencing.  Gait velocity decreased  Balance  Overall balance assessment Needs assistance;History of Falls   Sitting-balance support Feet supported;Single extremity supported  Sitting balance-Leahy Scale Fair  Standing balance support Bilateral upper extremity supported;During functional activity  Standing balance-Leahy Scale Poor  Standing balance comment Pt reliant on BUE support on RW during static and dynamic functional tasks.  PT - End of Session  Equipment Utilized During Treatment Gait belt  Activity Tolerance Patient limited by fatigue  Patient left in bed;with call bell/phone within reach (On bed in ED)  Nurse Communication Mobility status  PT Assessment  PT Recommendation/Assessment Patient needs continued PT services  PT Visit Diagnosis History of falling (Z91.81);Muscle weakness (generalized) (M62.81);Unsteadiness on feet (R26.81);Pain  Pain - part of body  (head)  PT Problem List Decreased strength;Decreased activity tolerance;Decreased range of motion;Decreased balance;Decreased mobility;Decreased coordination;Decreased cognition;Decreased knowledge of use of DME;Decreased safety awareness;Decreased knowledge of precautions;Pain  PT Plan  PT Frequency (ACUTE ONLY) Min 3X/week  PT Treatment/Interventions (ACUTE ONLY) DME instruction;Gait training;Functional mobility training;Therapeutic activities;Therapeutic exercise;Balance training;Neuromuscular re-education;Cognitive remediation;Patient/family education  AM-PAC PT "6 Clicks" Mobility Outcome Measure (Version 2)  Help needed turning from your back to your side while in a flat bed without using bedrails? 3  Help needed moving from lying on your back to sitting on the side of a flat bed without using bedrails? 2  Help needed moving to and from a bed to a chair (including a wheelchair)? 3  Help needed standing up from a chair using your arms (e.g., wheelchair or bedside chair)? 3  Help needed to walk in hospital room? 3  Help needed climbing 3-5 steps with a railing?  1  6 Click Score 15  Consider Recommendation of Discharge To:  CIR/SNF/LTACH  Progressive Mobility  What is the highest level of mobility based on the progressive mobility assessment? Level 4 (Walks with assist in room) - Balance while marching in place and cannot step forward and back - Complete  Mobility Out of bed for toileting;Out of bed to chair with meals  PT Recommendation  Follow Up Recommendations Other (comment) (SNF;  unless family can provide necessary 24/7 assist then HHPT can be considered.)  PT equipment 3in1 (PT)  Individuals Consulted  Consulted and Agree with Results and Recommendations Patient unable/family or caregiver not available  Acute Rehab PT Goals  Patient Stated Goal none stated  PT Goal Formulation Patient unable to participate in goal setting  Time For Goal Achievement 02/17/21  Potential to Achieve Goals Fair  PT Time Calculation  PT Start Time (ACUTE ONLY) 0845  PT Stop Time (ACUTE ONLY) 0910  PT Time Calculation (min) (ACUTE ONLY) 25 min  PT General Charges  $$ ACUTE PT VISIT 1 Visit  PT Evaluation  $PT Eval Moderate Complexity 1 Mod  PT Treatments  $Therapeutic Activity 8-22 mins   Pt presents with the impairments and problems listed above. Pt required mod assist for bed mobility, min guard to min assist for transfers and ambulation at EOB with RW. Pt oriented only to self, required max multimodal cuing. Family provides 24/7 supervision at baseline. Feel pt would benefit from SNF-level therapies, however, family may decide to take patient home with HHPT. If family takes pt home, will need to ensure appropriate 24/7 assistance. We will continue to follow the pt acutely to promote independence with functional mobility.  Dawayne Cirri, SPT

## 2021-02-04 ENCOUNTER — Other Ambulatory Visit: Payer: Medicare Other

## 2021-02-04 DIAGNOSIS — L8990 Pressure ulcer of unspecified site, unspecified stage: Secondary | ICD-10-CM | POA: Insufficient documentation

## 2021-02-04 LAB — COMPREHENSIVE METABOLIC PANEL
ALT: 30 U/L (ref 0–44)
AST: 26 U/L (ref 15–41)
Albumin: 2.6 g/dL — ABNORMAL LOW (ref 3.5–5.0)
Alkaline Phosphatase: 166 U/L — ABNORMAL HIGH (ref 38–126)
Anion gap: 13 (ref 5–15)
BUN: 20 mg/dL (ref 8–23)
CO2: 23 mmol/L (ref 22–32)
Calcium: 8.7 mg/dL — ABNORMAL LOW (ref 8.9–10.3)
Chloride: 100 mmol/L (ref 98–111)
Creatinine, Ser: 1.2 mg/dL — ABNORMAL HIGH (ref 0.44–1.00)
GFR, Estimated: 45 mL/min — ABNORMAL LOW (ref >60)
Glucose, Bld: 113 mg/dL — ABNORMAL HIGH (ref 70–99)
Potassium: 3.7 mmol/L (ref 3.5–5.1)
Sodium: 136 mmol/L (ref 135–145)
Total Bilirubin: 1.1 mg/dL (ref 0.3–1.2)
Total Protein: 6.1 g/dL — ABNORMAL LOW (ref 6.5–8.1)

## 2021-02-04 LAB — URINE CULTURE: Culture: >=100000 — AB

## 2021-02-04 LAB — CBC WITH DIFFERENTIAL/PLATELET
Abs Immature Granulocytes: 0.04 10*3/uL (ref 0.00–0.07)
Basophils Absolute: 0 10*3/uL (ref 0.0–0.1)
Basophils Relative: 0 %
Eosinophils Absolute: 0.3 10*3/uL (ref 0.0–0.5)
Eosinophils Relative: 3 %
HCT: 28.3 % — ABNORMAL LOW (ref 36.0–46.0)
Hemoglobin: 9.1 g/dL — ABNORMAL LOW (ref 12.0–15.0)
Immature Granulocytes: 0 %
Lymphocytes Relative: 25 %
Lymphs Abs: 2.5 10*3/uL (ref 0.7–4.0)
MCH: 28.2 pg (ref 26.0–34.0)
MCHC: 32.2 g/dL (ref 30.0–36.0)
MCV: 87.6 fL (ref 80.0–100.0)
Monocytes Absolute: 0.9 10*3/uL (ref 0.1–1.0)
Monocytes Relative: 10 %
Neutro Abs: 6.1 10*3/uL (ref 1.7–7.7)
Neutrophils Relative %: 62 %
Platelets: 418 10*3/uL — ABNORMAL HIGH (ref 150–400)
RBC: 3.23 MIL/uL — ABNORMAL LOW (ref 3.87–5.11)
RDW: 16.2 % — ABNORMAL HIGH (ref 11.5–15.5)
WBC: 9.8 10*3/uL (ref 4.0–10.5)
nRBC: 0 % (ref 0.0–0.2)

## 2021-02-04 MED ORDER — CEPHALEXIN 500 MG PO CAPS
500.0000 mg | ORAL_CAPSULE | Freq: Three times a day (TID) | ORAL | Status: DC
  Administered 2021-02-04 – 2021-02-06 (×6): 500 mg via ORAL
  Filled 2021-02-04 (×6): qty 1

## 2021-02-04 NOTE — Progress Notes (Signed)
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-- initial fall 9/13-came to ED-CT showed (scalp hematoma CT spine   [-] patient was started on cefdinir for suspected urinary tract infection Return 9/14 2/2 repeat fall hit back of head in addition Repeat CT head = 8 mm right parafalcine subdural hematoma left scalp hematoma 12 mm without fracture multilevel severe degenerative changes noted on CT neck  Hospital-Problem based course  Recurrent fall with 8 mm right parafalcine subdural hematoma, 12 mm left scalp hematoma without fracture Multiple falls indicate risk of further issues-neurosurgery is recommending holding aspirin for 1 week [May resume on 02/08/2021] Rpt scan 9/15 not concerning for significant worsening and neurosurgery have signed off UTI from prior to admission Urine cultures 9/13 growing >100,000 g Klebsiella Transitioned to Keflex tid to complete 3 days total ending on 9/17 Hypertension with possible orthostatic hypotension Orthostatics today to see if this played a role would hold HCTZ 25, avapro 150. continue propanolol 20 twice daily for now Moderately controlled DM TY 2 Resume metformin 500 twice daily a.m. 9/16 - resume Zocor Moderate to severe dementia Continue Namzaric (memantine/donepezil) 1 capsule at bedtime, continue Celexa 10 qd Is able to orient mainly to person cannot orient to place etc.    DVT prophylaxis: SCD Code Status: DNR Family Communication: discussed at bedside on 9/16 daughter #, [name] [phone]  Disposition:  Status is: Observation  The patient will require care spanning > 2 midnights and should be moved to inpatient because: Hemodynamically unstable and Unsafe d/c plan  Dispo: The patient is from: Home              Anticipated d/c is  to: CIR potentially if not accepted for rehab inpatient family wishes to take her home and will be able to supplement coverage if needed              Patient currently is medically stable to d/c.   Difficult to place patient No  Consultants:  Neurosurgery was telephone consulted on 9/14 by the ED  Procedures:   Antimicrobials:   ceftriaxone  Subjective:  Some confusion noted-improved slightly compared to yesterday Awake more so than yesterday and looks better overall  Objective: Vitals:   02/03/21 1955 02/03/21 2313 02/04/21 0327 02/04/21 0740  BP: (!) 135/57 (!) 156/68 (!) 144/66 (!) 181/77  Pulse: 91 83 73 79  Resp: 20 (!) '23 17 17  '$ Temp: 98.9 F (37.2 C) 98 F (36.7 C) 98 F (36.7 C) 98.4 F (36.9 C)  TempSrc: Oral Oral Oral Oral  SpO2: 97% 100% 98% 99%    Intake/Output Summary (Last 24 hours) at 02/04/2021 1102 Last data filed at 02/04/2021 0556 Gross per 24 hour  Intake 220 ml  Output --  Net 220 ml   There were no vitals filed for this visit.  Examination:  Awake-cannot tell me where she is and recognizes daughter EOMI NCAT-staples in the back of skull noted without any significant blood or bleeding Moving 4 limbs equally good power although limited by effort in lower extremities specifically S1-S2 no murmur Chest is clear no added sound no rales no rhonchi ROM is intact No lower extremity edema chronic venous stasis changes noted   Data Reviewed: personally reviewed   CBC    Component Value Date/Time   WBC  9.8 02/04/2021 0131   RBC 3.23 (L) 02/04/2021 0131   HGB 9.1 (L) 02/04/2021 0131   HCT 28.3 (L) 02/04/2021 0131   PLT 418 (H) 02/04/2021 0131   MCV 87.6 02/04/2021 0131   MCH 28.2 02/04/2021 0131   MCHC 32.2 02/04/2021 0131   RDW 16.2 (H) 02/04/2021 0131   LYMPHSABS 2.5 02/04/2021 0131   MONOABS 0.9 02/04/2021 0131   EOSABS 0.3 02/04/2021 0131   BASOSABS 0.0 02/04/2021 0131   CMP Latest Ref Rng & Units 02/04/2021 02/03/2021 02/02/2021   Glucose 70 - 99 mg/dL 113(H) 141(H) 135(H)  BUN 8 - 23 mg/dL '20 14 13  '$ Creatinine 0.44 - 1.00 mg/dL 1.20(H) 1.06(H) 1.04(H)  Sodium 135 - 145 mmol/L 136 137 136  Potassium 3.5 - 5.1 mmol/L 3.7 3.8 3.8  Chloride 98 - 111 mmol/L 100 101 102  CO2 22 - 32 mmol/L '23 24 25  '$ Calcium 8.9 - 10.3 mg/dL 8.7(L) 9.2 9.1  Total Protein 6.5 - 8.1 g/dL 6.1(L) - 6.4(L)  Total Bilirubin 0.3 - 1.2 mg/dL 1.1 - 0.9  Alkaline Phos 38 - 126 U/L 166(H) - 228(H)  AST 15 - 41 U/L 26 - 36  ALT 0 - 44 U/L 30 - 43     Radiology Studies: CT HEAD WO CONTRAST (5MM)  Result Date: 02/03/2021 CLINICAL DATA:  Subdural hematoma.  Follow-up. EXAM: CT HEAD WITHOUT CONTRAST TECHNIQUE: Contiguous axial images were obtained from the base of the skull through the vertex without intravenous contrast. COMPARISON:  CT head, 02/02/2021. FINDINGS: Brain: No evidence of acute infarction, hydrocephalus, or mass lesion/mass effect. Relatively similar size and appearance of parafalcine and tentorial extra-axial collection, accounting for mild redistribution, measuring up to 0.9 cm at the level of the falx cerebri (previously 0.9 cm when remeasured. Vascular: No hyperdense vessel or unexpected calcification. Skull: Normal. Negative for fracture or focal lesion. Incidental hyperostosis frontalis. Sinuses/Orbits: No acute finding. Mild paranasal sinus mucosal thickening. Other: Repaired LEFT posterior scalp laceration, with trace subgaleal hematoma. IMPRESSION: Since CT head dated 02/02/2021 at 1857 hours; 1. No new or enlarging intracranial hemorrhage. 2. Similar size and appearance of subdural hematoma along falx and tentorium, measuring up to 0.9 cm. 3. Repair of LEFT posterior scalp laceration, with trace subgaleal hematoma. Electronically Signed   By: [name] M.D.   On: 02/03/2021 09:51   CT HEAD WO CONTRAST (5MM)  Result Date: 02/02/2021 CLINICAL DATA:  Recurrent fall, new occipital laceration. EXAM: CT HEAD WITHOUT CONTRAST CT  CERVICAL SPINE WITHOUT CONTRAST TECHNIQUE: Multidetector CT imaging of the head and cervical spine was performed following the standard protocol without intravenous contrast. Multiplanar CT image reconstructions of the cervical spine were also generated. COMPARISON:  CT head and cervical spine 02/01/2021 FINDINGS: CT HEAD FINDINGS BRAIN: BRAIN Patchy and confluent areas of decreased attenuation are noted throughout the deep and periventricular white matter of the cerebral hemispheres bilaterally, compatible with chronic microvascular ischemic disease. No evidence of large-territorial acute infarction. No parenchymal hemorrhage. No mass lesion. Interval development of a right parafalcine hyperdense extra-axial fluid extending along the right tentorium. On coronal this measures up to 8 mm along the falx cerebral (4:28). No mass effect or midline shift. No hydrocephalus. Basilar cisterns are patent. Vascular: No hyperdense vessel. Atherosclerotic calcifications are present within the cavernous internal carotid arteries. Skull: No acute fracture or focal lesion. Sinuses/Orbits: Paranasal sinuses and mastoid air cells are clear. The orbits are unremarkable. Other: Left occipital scalp 12 mm hematoma with associated subcutaneus soft tissue edema  and emphysema. No retained radiopaque foreign body. CT CERVICAL SPINE FINDINGS Alignment: Grade 1 anterolisthesis of C3 on C4. Skull base and vertebrae: C4 through C7 anterior and interbody fusion. Multilevel severe degenerative changes of the spine. Severe bilateral osseous neural foraminal stenosis at the C3-C4 level. No acute fracture. No aggressive appearing focal osseous lesion or focal pathologic process. Soft tissues and spinal canal: No prevertebral fluid or swelling. No visible canal hematoma. Upper chest: Unremarkable. Other: None. IMPRESSION: 1. An 8 mm right parafalcine subdural hemorrhage extending inferiorly along the right tentorium. 2. Left occipital scalp 12 mm  hematoma with no underlying calvarial fracture. 3. No acute displaced fracture or traumatic listhesis of the cervical spine. 4. C4 - C7 anterior and interbody fusion. Multilevel severe degenerative changes of the spine with severe bilateral osseous neural foraminal stenosis at the C3-C4 level. These results were called by telephone at the time of interpretation on 02/02/2021 at 7:26 pm to provider [facility] , who verbally acknowledged these results. Electronically Signed   By: Iven Finn M.D.   On: 02/02/2021 19:29   CT Cervical Spine Wo Contrast  Result Date: 02/02/2021 CLINICAL DATA:  Recurrent fall, new occipital laceration. EXAM: CT HEAD WITHOUT CONTRAST CT CERVICAL SPINE WITHOUT CONTRAST TECHNIQUE: Multidetector CT imaging of the head and cervical spine was performed following the standard protocol without intravenous contrast. Multiplanar CT image reconstructions of the cervical spine were also generated. COMPARISON:  CT head and cervical spine 02/01/2021 FINDINGS: CT HEAD FINDINGS BRAIN: BRAIN Patchy and confluent areas of decreased attenuation are noted throughout the deep and periventricular white matter of the cerebral hemispheres bilaterally, compatible with chronic microvascular ischemic disease. No evidence of large-territorial acute infarction. No parenchymal hemorrhage. No mass lesion. Interval development of a right parafalcine hyperdense extra-axial fluid extending along the right tentorium. On coronal this measures up to 8 mm along the falx cerebral (4:28). No mass effect or midline shift. No hydrocephalus. Basilar cisterns are patent. Vascular: No hyperdense vessel. Atherosclerotic calcifications are present within the cavernous internal carotid arteries. Skull: No acute fracture or focal lesion. Sinuses/Orbits: Paranasal sinuses and mastoid air cells are clear. The orbits are unremarkable. Other: Left occipital scalp 12 mm hematoma with associated subcutaneus soft tissue edema  and emphysema. No retained radiopaque foreign body. CT CERVICAL SPINE FINDINGS Alignment: Grade 1 anterolisthesis of C3 on C4. Skull base and vertebrae: C4 through C7 anterior and interbody fusion. Multilevel severe degenerative changes of the spine. Severe bilateral osseous neural foraminal stenosis at the C3-C4 level. No acute fracture. No aggressive appearing focal osseous lesion or focal pathologic process. Soft tissues and spinal canal: No prevertebral fluid or swelling. No visible canal hematoma. Upper chest: Unremarkable. Other: None. IMPRESSION: 1. An 8 mm right parafalcine subdural hemorrhage extending inferiorly along the right tentorium. 2. Left occipital scalp 12 mm hematoma with no underlying calvarial fracture. 3. No acute displaced fracture or traumatic listhesis of the cervical spine. 4. C4 - C7 anterior and interbody fusion. Multilevel severe degenerative changes of the spine with severe bilateral osseous neural foraminal stenosis at the C3-C4 level. These results were called by telephone at the time of interpretation on 02/02/2021 at 7:26 pm to provider [facility] , who verbally acknowledged these results. Electronically Signed   By: Iven Finn M.D.   On: 02/02/2021 19:29     Scheduled Meds:  citalopram  10 mg Oral Daily   memantine  28 mg Oral QHS   And   donepezil  10 mg Oral QHS  mupirocin ointment   Nasal BID   propranolol  20 mg Oral BID   Continuous Infusions:  cefTRIAXone (ROCEPHIN)  IV 1 g (02/03/21 2153)     LOS: 1 day   Time spent: 25  Nita Sells, MD [facility] To contact the attending provider between 7A-7P or the covering provider during after hours 7P-7A, please log into the web site [email or web address] and access using universal [place] password for that web site. If you do not have the password, please call the hospital operator.  02/04/2021, 11:02 AM

## 2021-02-04 NOTE — TOC Initial Note (Signed)
Transition of Care (TOC) - Initial/Assessment Note  Marvetta Gibbons RN,BSN Transitions of Care Unit 4NP (non trauma) - RN Case Manager See Treatment Team for direct Phone #    Patient Details  Name: [name] MRN: [id number] Date of Birth: 10-Jun-1940  Transition of Care [facility]) CM/SW Contact:    Dawayne Patricia, RN Phone Number: 02/04/2021, 12:47 PM  Clinical Narrative:                 Pt from home w/ spouse, son and daughter assist as needed. Pt admitted s/p fall with SDH. CIR has been consulted- assessment pending.   CM in to speak with daughter and patient at bedside to discuss transition plans. Per daughter they would be very interested in [place] rehab then home or Home with [facility] - however do not desire to look at SNF. Daughter reports that patient has a LT care policy that has a 90 day wait period that they could use for in home care. They also are able to pay out of pocket for private duty assistance if needed per daughter. Daughter requesting info for private duty and verbal permission given for "goggle" search and will provide daughter with list of area agencies for a starting point in their search.  Per daughter pt has rollator at home, but they would like a regular RW, maybe a 3n1?-  discussed [place] services under Medicare vs private duty and the differences in services.   List provided for skilled [facility] services- Per CMS guidelines from medicare.gov website with star ratings (copy placed in shadow chart) , as well as list for private duty options as per "goggle" search request.   Will await CIR consult - family hopeful for CIR vs home with [facility]   Expected Discharge Plan: IP Rehab Facility Barriers to Discharge: Continued Medical Work up   Patient Goals and CMS Choice Patient states their goals for this hospitalization and ongoing recovery are:: INPT rehab then home CMS Medicare.gov Compare Post Acute Care list provided to:: Patient Represenative (must comment) (daughter) Choice  offered to / list presented to : Adult Children  Expected Discharge Plan and Services Expected Discharge Plan: Long   Discharge Planning Services: CM Consult Post Acute Care Choice: IP Rehab, Home Health Living arrangements for the past 2 months: Single Family Home                                      Prior Living Arrangements/Services Living arrangements for the past 2 months: Single Family Home Lives with:: Spouse Patient language and need for interpreter reviewed:: Yes Do you feel safe going back to the place where you live?: Yes      Need for Family Participation in Patient Care: Yes (Comment) Care giver support system in place?: Yes (comment) Current home services: DME (rollator) Criminal Activity/Legal Involvement Pertinent to Current Situation/Hospitalization: No - Comment as needed  Activities of Daily Living      Permission Sought/Granted Permission sought to share information with : Chartered certified accountant granted to share information with : Yes, Verbal Permission Granted              Emotional Assessment Appearance:: Appears stated age Attitude/Demeanor/Rapport: Engaged Affect (typically observed): Pleasant, Quiet Orientation: : Oriented to Self Alcohol / Substance Use: Not Applicable Psych Involvement: No (comment)  Admission diagnosis:  Subdural hematoma ([place]) [S06.5X9A] Subdural hematoma, post-traumatic ([place]) [S06.5X9A] Fall,  initial encounter [W19.XXXA] Laceration of scalp, initial encounter [S01.01XA] Patient Active Problem List   Diagnosis Date Noted   Pressure injury of skin 02/04/2021   Subdural hematoma (HCC) 02/03/2021   Subdural hematoma, post-traumatic (HCC) 02/02/2021   Recurrent falls 02/02/2021   Laceration of occipital region of scalp 02/02/2021   LFTs abnormal 12/23/2020   Deficiency anemia 07/27/2020   Insomnia, 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   Corns and callus 07/27/2020   Chronic renal  disease, stage 3, moderately decreased glomerular filtration rate (GFR) between 30-59 mL/min/1.73 square meter ([place]) 09/26/2018   Senile dementia without behavioral disturbance ([place]) 03/21/2017   Atherosclerosis of native artery of both lower extremities with intermittent claudication ([place]) 11/24/2015   Cervical stenosis of spinal canal 12/22/2014   Visit for screening mammogram 09/16/2014   Routine health maintenance 01/03/2011   Peripheral neuropathy ([place]) 08/23/2010    Class: Diagnosis of   DM (diabetes mellitus) type II controlled, neurological manifestation ([place]) 07/21/2010   Carotid artery stenosis 12/15/2009   DEGENERATIVE [place] DISEASE, LUMBOSACRAL SPINE W/RADICULOPATHY 12/29/2008   Dyslipidemia, goal LDL below 70 02/16/2007   Gout 02/16/2007   Essential hypertension 02/16/2007   PCP:  Janith Lima, MD Pharmacy:   [facility] [place] [place] [address] Phone: [phone] Fax: [phone]     Social Determinants of Health (SDOH) Interventions    Readmission Risk Interventions No flowsheet data found.

## 2021-02-04 NOTE — Progress Notes (Signed)
Occupational Therapy Treatment Patient Details Name: [name] MRN: [id number] DOB: Feb 26, 1941 Today's Date: 02/04/2021   History of present illness Pt is a 80yo female presenting to [facility] ED on 9/14 after a fall where she hit her head. CT revealed subdural hematoma on the R. She came to [facility] ED on 9/13 after a different fall and was discharged. PMH: HTN, DM, dementia, history of falling, cervical spine fusion C4-C7.   OT comments  Patient with fair gains toward goals.  Patient's daughter in the room, and able to describe home routine.  Patient does have a 4WRW at home, but tends to abandon it.  Daughter stating cognition is close to her baseline, and she does require occasional assist with showers for thoroughness.  Husband assists with medications, meals, family helps with home management and community mobility.  Spouse does have a part time job, so family provides supervision when he is out.  CIR has been recommended for intensive rehab prior to transitioning home.  Currently she is needing up to [place] [facility] for basic mobility, and up to Mod A for lower body ADL.  OT to continue to follow her in the acute setting to maximize her functional status.     Recommendations for follow up therapy are one component of a multi-disciplinary discharge planning process, led by the attending physician.  Recommendations may be updated based on patient status, additional functional criteria and insurance authorization.    Follow Up Recommendations  CIR    Equipment Recommendations  3 in 1 bedside commode;Tub/shower bench    Recommendations for Other Services      Precautions / Restrictions Precautions Precautions: Fall Precaution Comments: Multiple recent falls. Restrictions Weight Bearing Restrictions: No       Mobility Bed Mobility Overal bed mobility: Needs Assistance Bed Mobility: Supine to Sit     Supine to sit: Min assist;HOB elevated       Patient Response: Flat  affect  Transfers Overall transfer level: Needs assistance Equipment used: Rolling walker (2 wheeled) Transfers: Sit to/from [facility] Sit to Stand: Min assist;Mod assist Stand pivot transfers: Min assist       General transfer comment: Verbal cues for RW use,,, patient tends to abandon the RW at home.    Balance Overall balance assessment: Needs assistance;History of Falls Sitting-balance support: Feet supported;Single extremity supported Sitting balance-Leahy Scale: Fair   Postural control: Posterior lean Standing balance support: Bilateral upper extremity supported;During functional activity Standing balance-Leahy Scale: Poor Standing balance comment: Pt reliant on BUE support on RW during static and dynamic functional tasks.                           ADL either performed or assessed with clinical judgement   ADL       Grooming: Wash/dry hands;Min guard;Standing   Upper Body Bathing: Minimal assistance;Sitting Upper Body Bathing Details (indicate cue type and reason): throughness Lower Body Bathing: Moderate assistance;Sit to/from stand   Upper Body Dressing : Minimal assistance;Sitting   Lower Body Dressing: Moderate assistance;Sit to/from stand   Toilet Transfer: Minimal assistance;BSC;Ambulation;RW   Toileting- Water quality scientist and Hygiene: Min guard;Sitting/lateral lean       Functional mobility during ADLs: Minimal assistance       Vision Patient Visual Report: No change from baseline     Perception     Praxis      Cognition Arousal/Alertness: Awake/alert Behavior During Therapy: Flat affect Overall Cognitive Status: History  of cognitive impairments - at baseline                                 General Comments: Pt has history of dementia, oriented to self only. Pt able to follow basic/simple one step commands, but inconsistently.  Trouble sequencing.        Exercises     Shoulder Instructions        General Comments      Pertinent Vitals/ Pain       Pain Assessment: Faces Faces Pain Scale: No hurt Pain Intervention(s): Monitored during session                                                          Frequency  Min 2X/week        Progress Toward Goals  OT Goals(current goals can now be found in the care plan section)  Progress towards OT goals: Progressing toward goals  Acute Rehab OT Goals Patient Stated Goal: none stated OT Goal Formulation: Patient unable to participate in goal setting Time For Goal Achievement: 02/17/21 ADL Goals Pt Will Perform Grooming: with supervision;standing Pt Will Perform Upper Body Bathing: with set-up;standing;sitting Pt Will Perform Upper Body Dressing: with supervision;sitting;standing Pt Will Transfer to Toilet: with supervision;ambulating;regular height toilet Pt Will Perform Toileting - Clothing Manipulation and hygiene: Independently;sitting/lateral leans  Plan Discharge plan needs to be updated    Co-evaluation                 AM-PAC OT "6 Clicks" Daily Activity     Outcome Measure   Help from another person eating meals?: A Little Help from another person taking care of personal grooming?: A Little Help from another person toileting, which includes using toliet, bedpan, or urinal?: A Little Help from another person bathing (including washing, rinsing, drying)?: A Lot Help from another person to put on and taking off regular upper body clothing?: A Little Help from another person to put on and taking off regular lower body clothing?: A Lot 6 Click Score: 16    End of Session Equipment Utilized During Treatment: Gait belt;Rolling walker  OT Visit Diagnosis: Unsteadiness on feet (R26.81);Muscle weakness (generalized) (M62.81);Pain   Activity Tolerance Patient tolerated treatment well   Patient Left in chair;with call bell/phone within reach;with chair alarm set   Nurse  Communication Mobility status        Time: 1110-1144 OT Time Calculation (min): 34 min  Charges: OT General Charges $OT Visit: 1 Visit OT Treatments $Self Care/Home Management : 23-37 mins  02/04/2021  RP, OTR/L  Acute Rehabilitation Services  Office:  [phone]   Metta Clines 02/04/2021, 11:49 AM

## 2021-02-05 LAB — BASIC METABOLIC PANEL
Anion gap: 11 (ref 5–15)
BUN: 22 mg/dL (ref 8–23)
CO2: 22 mmol/L (ref 22–32)
Calcium: 8.9 mg/dL (ref 8.9–10.3)
Chloride: 103 mmol/L (ref 98–111)
Creatinine, Ser: 1.09 mg/dL — ABNORMAL HIGH (ref 0.44–1.00)
GFR, Estimated: 51 mL/min — ABNORMAL LOW (ref >60)
Glucose, Bld: 109 mg/dL — ABNORMAL HIGH (ref 70–99)
Potassium: 3.7 mmol/L (ref 3.5–5.1)
Sodium: 136 mmol/L (ref 135–145)

## 2021-02-05 NOTE — Progress Notes (Signed)
Inpatient Rehab Admissions:  Inpatient Rehab Consult received.  I met with patient at the bedside for rehabilitation assessment and to discuss goals and expectations of an inpatient rehab admission.  Pt unable to understand information regarding CIR and unable to answer questions accurately. Pt gave permission to contact daughter, [name].  Spoke with [name] on the telephone. Explained CIR goals and expectations. [name] acknowledged understanding and is interested in pt pursuing CIR.  Will continue to follow.  Signed: Gayland Curry, [place], [place] Admissions Coordinator [phone]

## 2021-02-05 NOTE — Progress Notes (Signed)
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-- initial fall 9/13-came to ED-CT showed (scalp hematoma CT spine   [-] patient was started on cefdinir for suspected urinary tract infection Return 9/14 2/2 repeat fall hit back of head in addition Repeat CT head = 8 mm right parafalcine subdural hematoma left scalp hematoma 12 mm without fracture multilevel severe degenerative changes noted on CT neck  Hospital-Problem based course  Recurrent fall with 8 mm right parafalcine subdural hematoma, 12 mm left scalp hematoma without fracture neurosurgery is recommending holding aspirin for 1 week [May resume on 02/08/2021] Rpt scan 9/15 not concerning for significant worsening and neurosurgery have signed off UTI from prior to admission Urine cultures 9/13 growing >100,000 g Klebsiella Transitioned to Keflex tid to complete 3 days total ending on 9/17 Hypertension with possible orthostatic hypotension Orthostatics today to see if this played a role hold HCTZ 25, Avapro continue propanolol 20 twice daily for now Moderately controlled DM TY 2 Resume metformin 500 twice daily a.m. 9/16 - resume Zocor Moderate to severe dementia Continue Namzaric (memantine/donepezil) 1 capsule at bedtime, continue Celexa 10 qd Is able to orient mainly to person cannot orient to place etc.    DVT prophylaxis: SCD Code Status: DNR Family Communication: discussed at bedside on 9/16 daughter #, [name] [phone]  Disposition:  Status is: Observation  The patient will require care spanning > 2 midnights and should be moved to inpatient because: Hemodynamically unstable and Unsafe d/c plan  Dispo: The patient is from: Home              Anticipated d/c is to: CIR potentially if not accepted for rehab inpatient  family wishes to take her home and will be able to supplement coverage if needed              Patient currently is medically stable to d/c.   Difficult to place patient No  Consultants:  Neurosurgery was telephone consulted on 9/14 by the ED  Procedures:   Antimicrobials:   ceftriaxone  Subjective:  Awake but somewhat confused not completely coherent Sitting up in chair does not remember where he she is currently does not recall if he had breakfast --no other complaints   Objective: Vitals:   02/04/21 1954 02/04/21 2313 02/05/21 0400 02/05/21 0731  BP: (!) 156/67 (!) 158/59 (!) 153/69 (!) 158/74  Pulse: 82 72 67 76  Resp: (!) '22 17 19 18  '$ Temp: 98.2 F (36.8 C) 97.6 F (36.4 C) 98.4 F (36.9 C) 98.5 F (36.9 C)  TempSrc: Oral Oral Oral Oral  SpO2: 98% 94% 92% 97%    Intake/Output Summary (Last 24 hours) at 02/05/2021 1111 Last data filed at 02/05/2021 0930 Gross per 24 hour  Intake 360 ml  Output 150 ml  Net 210 ml    There were no vitals filed for this visit.  Examination:  Frail white female no distress No icterus no pallor Chest clear no rales rhonchi Abdomen soft Lower extremity has hyperkeratosis and stasis dermatitis   Data Reviewed: personally reviewed   CBC    Component Value Date/Time   WBC 9.8 02/04/2021 0131   RBC 3.23 (L) 02/04/2021 0131   HGB 9.1 (L) 02/04/2021 0131   HCT 28.3 (L) 02/04/2021 0131   PLT 418 (H) 02/04/2021 0131  MCV 87.6 02/04/2021 0131   MCH 28.2 02/04/2021 0131   MCHC 32.2 02/04/2021 0131   RDW 16.2 (H) 02/04/2021 0131   LYMPHSABS 2.5 02/04/2021 0131   MONOABS 0.9 02/04/2021 0131   EOSABS 0.3 02/04/2021 0131   BASOSABS 0.0 02/04/2021 0131   CMP Latest Ref Rng & Units 02/05/2021 02/04/2021 02/03/2021  Glucose 70 - 99 mg/dL 109(H) 113(H) 141(H)  BUN 8 - 23 mg/dL '22 20 14  '$ Creatinine 0.44 - 1.00 mg/dL 1.09(H) 1.20(H) 1.06(H)  Sodium 135 - 145 mmol/L 136 136 137  Potassium 3.5 - 5.1 mmol/L 3.7 3.7 3.8  Chloride 98 - 111  mmol/L 103 100 101  CO2 22 - 32 mmol/L '22 23 24  '$ Calcium 8.9 - 10.3 mg/dL 8.9 8.7(L) 9.2  Total Protein 6.5 - 8.1 g/dL - 6.1(L) -  Total Bilirubin 0.3 - 1.2 mg/dL - 1.1 -  Alkaline Phos 38 - 126 U/L - 166(H) -  AST 15 - 41 U/L - 26 -  ALT 0 - 44 U/L - 30 -     Radiology Studies: No results found.   Scheduled Meds:  cephALEXin  500 mg Oral Q8H   citalopram  10 mg Oral Daily   memantine  28 mg Oral QHS   And   donepezil  10 mg Oral QHS   mupirocin ointment   Nasal BID   propranolol  20 mg Oral BID   Continuous Infusions:     LOS: 2 days   Time spent: 20  Nita Sells, MD [facility] To contact the attending provider between 7A-7P or the covering provider during after hours 7P-7A, please log into the web site [email or web address] and access using universal [place] password for that web site. If you do not have the password, please call the hospital operator.  02/05/2021, 11:11 AM

## 2021-02-05 NOTE — PMR Pre-admission (Signed)
PMR Admission Coordinator Pre-Admission Assessment  Patient: [name] is an 80 y.o., female MRN: [id number] DOB: 1940/11/09 Height:   Weight:    Insurance Information HMO:     PPO:      PCP:      IPA:      80/20: yes     OTHER:  PRIMARY: Medicare A & B      Policy#: [id number]      Subscriber: patient CM Name:       Phone#:      Fax#:  Pre-Cert#:       Employer:  Benefits:  Phone #: verified eligibility online via [place] on 02/05/21     Name:  Eff. Date: Part A & B effective 10/20/04     Deduct: $1,556      Out of Pocket Max: NA      Life Max: NA CIR: 100% coverage      SNF: 100% days 1-20, 80% days 21-100 Outpatient: 80%     Co-Pay: 20% Home Health: 100%      Co-Pay:  DME: 80%     Co-Pay: 20% Providers: pt's choice SECONDARY: BCBS Supplement      Policy#: [id number]     Phone#: [phone]  Financial Counselor:       Phone#:   The "Data Collection Information Summary" for patients in Inpatient Rehabilitation Facilities with attached "Privacy Act [place] Records" was provided and verbally reviewed with: Patient and Family  Emergency Contact Information Contact Information     Name Relation Home Work Mobile   [name] Spouse [phone]     [name] Daughter   [phone]   [name]   [phone]       Current Medical History  Patient Admitting Diagnosis: subdural hematoma  History of Present Illness:  80 year old right-handed female with history of memory loss maintained on Namenda/Aricept, diabetes mellitus, hypertension, CKD stage III, ACDF 12/22/2014, quit smoking 25 years ago.    Patient was using a rolling walker prior to admission but needing prompting as well as noted falls.  Husband assist with ADLs.  Presented 02/02/2021 with recurrent falls.  Spouse does have a part-time job and family provide supervision when he is out.  She was found to have an abrasion on the back of her head that did require 5 staples in the ED cranial CT  scan showed a left occipital scalp 12 mm hematoma with no underlying calvarial fracture as well as 8 mm right parafalcine subdural hemorrhage extending inferiorly along the right tentorium.  CT cervical spine negative.  Admission chemistries unremarkable except glucose 154, creatinine 1.03, hemoglobin 10.5, urine culture greater 100,000 Klebsiella and placed on Keflex completing course of antibiotic.  Follow-up neurosurgery Dr. Duffy Rhody in regards to traumatic SDH advised conservative care with latest cranial CT scan showing no new or enlarging intracranial hemorrhage.  Patient was on low-dose aspirin 81 mg daily prior to admission advised to hold x1 week.  Bouts of orthostasis HCTZ was held.  Findings of elevated LFTs 02/07/2021 and monitored with ultrasound right upper quad showing an 8 mm GB polyp.  Her hep C antibody was positive.  Patient asymptomatic and reviewed with gastroenterology Dr. Carlean Purl and felt elevated LFTs related to recent antibiotic therapy for UTI although alk phos appears to been elevated since March 2021.. .  Consideration for HIDA scan if remains elevated..  Patient's LFTs continue trending downward with improvement.  She is currently on a dysphagia #2 thin liquid diet.    Patient's  medical record from [facility] has been reviewed by the rehabilitation admission coordinator and physician.  Past Medical History  Past Medical History:  Diagnosis Date   Contact dermatitis and other eczema, due to unspecified cause    Displacement of lumbar intervertebral disc without myelopathy    FH: cataracts    Gout, unspecified    Memory loss    Occlusion and stenosis of carotid artery without mention of cerebral infarction    Other and unspecified hyperlipidemia    Pain in joint, shoulder region    Pain in limb    Routine general medical examination at a health care facility    Sciatica    Type II or unspecified type diabetes mellitus without mention of complication,  uncontrolled    Unspecified essential hypertension    Unspecified venous (peripheral) insufficiency    Has the patient had major surgery during 100 days prior to admission? No  Family History   family history includes Alzheimer's disease in her mother; Cancer in her brother and brother; Coronary artery disease in her father; Diabetes in her mother; Hypertension in her father.  Current Medications  Current Facility-Administered Medications:    amLODipine (NORVASC) tablet 5 mg, 5 mg, Oral, Daily, Samtani, Jai-Gurmukh, MD, 5 mg at 02/10/21 1030   citalopram (CELEXA) tablet 10 mg, 10 mg, Oral, Daily, Posey Pronto, Vishal R, MD, 10 mg at 02/10/21 1030   memantine (NAMENDA XR) 24 hr capsule 28 mg, 28 mg, Oral, QHS, 28 mg at 02/09/21 2142 **AND** donepezil (ARICEPT) tablet 10 mg, 10 mg, Oral, QHS, Patel, Vishal R, MD, 10 mg at 02/09/21 2142   mupirocin ointment (BACTROBAN) 2 %, , Nasal, BID, Nita Sells, MD, Given at 02/09/21 2142   ondansetron (ZOFRAN) tablet 4 mg, 4 mg, Oral, Q6H PRN **OR** ondansetron (ZOFRAN) injection 4 mg, 4 mg, Intravenous, Q6H PRN, Lenore Cordia, MD   propranolol (INDERAL) tablet 20 mg, 20 mg, Oral, BID, Zada Finders R, MD, 20 mg at 02/10/21 1030  Patients Current Diet:  Diet Order             Diet - low sodium heart healthy           Diet regular Room service appropriate? No; Fluid consistency: Thin  Diet effective now                  Precautions / Restrictions Precautions Precautions: Fall Precaution Comments: Multiple recent falls. Restrictions Weight Bearing Restrictions: No   Has the patient had 2 or more falls or a fall with injury in the past year? Yes  Prior Activity Level Limited Community (1-2x/wk): gets out of house ~1day/week  Prior Functional Level Self Care: Did the patient need help bathing, dressing, using the toilet or eating? Needed some asisstance  Indoor Mobility: Did the patient need assistance with walking from room to room  (with or without device)? Independent  Stairs: Did the patient need assistance with internal or external stairs (with or without device)? Independent  Functional Cognition: Did the patient need help planning regular tasks such as shopping or remembering to take medications? Dependent  Patient Information Are you of Hispanic, Latino/a,or Spanish origin?: X. Patient unable to respond, A. No, not of Hispanic, Latino/a, or Spanish origin What is your race?: X. Patient unable to respond, A. White Do you need or want an interpreter to communicate with a doctor or health care staff?: 9. Unable to respond  Patient's Response To:  Health Literacy and Transportation Is the patient able  to respond to health literacy and transportation needs?: No  Home Assistive Devices / Equipment Home Equipment: Environmental consultant - 2 wheels, [place] - single point, Shower seat  Prior Device Use: Indicate devices/aids used by the patient prior to current illness, exacerbation or injury? Walker and cane  Current Functional Level Cognition  Arousal/Alertness: Awake/alert Overall Cognitive Status: History of cognitive impairments - at baseline Orientation Level: Oriented to person General Comments: Pt with history of dementia.  Oriented person only.  Not sure why at hospital.  Has difficulty following directional commands. Attention: Focused Focused Attention: Impaired Focused Attention Impairment: Verbal basic, Functional basic Memory: Impaired Memory Impairment: Decreased long term memory, Decreased short term memory Awareness: Impaired Awareness Impairment: Intellectual impairment, Emergent impairment, Anticipatory impairment Problem Solving: Impaired Problem Solving Impairment: Verbal basic, Functional basic Safety/Judgment: Impaired    Extremity Assessment (includes Sensation/Coordination)  Upper Extremity Assessment: Overall WFL for tasks assessed  Lower Extremity Assessment: Defer to PT evaluation RLE Deficits /  Details: BLE scaling noted on calves. Pt has limited ROM, likely secondary to generalized weakness. LLE Deficits / Details: BLE scaling noted on calves. Pt has limited ROM, likely secondary to generalized weakness.    ADLs  Overall ADL's : Needs assistance/impaired Grooming: Wash/dry hands, Min guard, Standing Grooming Details (indicate cue type and reason): stood at sink for 3 minutes. Required cues for where to turn on water, where to get soap and where to get paper towels even when cued to attempt to find these things. Upper Body Bathing: Minimal assistance, Sitting Upper Body Bathing Details (indicate cue type and reason): throughness Lower Body Bathing: Moderate assistance, Sit to/from stand Upper Body Dressing : Minimal assistance, Sitting Lower Body Dressing: Moderate assistance, Sit to/from stand Toilet Transfer: Minimal assistance, Ambulation, Regular Toilet, Grab bars, RW Toilet Transfer Details (indicate cue type and reason): Pt walked to bathroom with walker and min assist. Pt could not find toilet in the bathroom without max cues.  Pt was wearning glasses. Toileting- Clothing Manipulation and Hygiene: Moderate assistance, Sitting/lateral lean, Sit to/from stand Toileting - Clothing Manipulation Details (indicate cue type and reason): Pt unable to figure out what to do with toilet paper when placed in her hand.  Family stated this has become increasingly difficult at home. Functional mobility during ADLs: Minimal assistance General ADL Comments: Pt with sigificant cognitive deficits that affect ability to be safe with adls.    Mobility  Overal bed mobility: Needs Assistance Bed Mobility: Supine to Sit Supine to sit: Min assist, HOB elevated Sit to supine: Mod assist General bed mobility comments: in recliner    Transfers  Overall transfer level: Needs assistance Equipment used: Rolling walker (2 wheeled) Transfers: Sit to/from Stand Sit to Stand: Mod assist Stand pivot  transfers: Min assist General transfer comment: lifting help to stand from recliner, pt placing hands on walker    Ambulation / Gait / Stairs / Wheelchair Mobility  Ambulation/Gait Ambulation/Gait assistance: Herbalist (Feet): 100 Feet Assistive device: Rolling walker (2 wheeled) Gait Pattern/deviations: Step-to pattern, Decreased stride length, Shuffle General Gait Details: cues for proximity to walker, assist and increased time for turning walker, pt with short shuffling steps Gait velocity: decr    Posture / Balance Balance Overall balance assessment: Needs assistance Sitting-balance support: Feet supported, Single extremity supported Sitting balance-Leahy Scale: Fair Postural control: Posterior lean Standing balance support: Single extremity supported, Bilateral upper extremity supported, During functional activity Standing balance-Leahy Scale: Poor Standing balance comment: standing looking at coloring page on the wall  pointing out colors with 1 UE support working on upright posture, walker proximity, etc    Special needs/care consideration Fall precautions Decreased safety awareness   Previous Home Environment  Living Arrangements: Spouse/significant other  Lives With: Significant other Available Help at Discharge: Family, Available 24 hours/day Type of Home: House Home Layout: One level Home Access: Stairs to enter Entrance Stairs-Rails: Left Entrance Stairs-Number of Steps: 2 Bathroom Shower/Tub: Government social research officer Accessibility: No (daughter  unsure if a walker would fit inside bathroom) [place]: No  Discharge Living Setting Plans for Discharge Living Setting: Patient's home Type of Home at Discharge: House Discharge Home Layout: One level Discharge Home Access: Stairs to enter Entrance Stairs-Rails: Left Entrance Stairs-Number of Steps: 2 Discharge Bathroom Shower/Tub: [place] unit Discharge Bathroom  Toilet: Standard Discharge Bathroom Accessibility: No (daughter unsure if a walker will fit inside bathroom) Does the patient have any problems obtaining your medications?: No  Social/Family/Support Systems Anticipated Caregiver: Casimer Lanius, daughter, Adylynn Hertenstein, husband, and hired help Anticipated Ambulance person Information: [name]: [phone]; Laverna Peace: [phone] Caregiver Availability: 24/7 Discharge Plan Discussed with Primary Caregiver: Yes Is Caregiver In Agreement with Plan?: Yes Does Caregiver/Family have Issues with Lodging/Transportation while Pt is in Rehab?: No  Goals Patient/Family Goal for Rehab: Supervision: PT/OT/ST Expected length of stay: 7-10 days Pt/Family Agrees to Admission and willing to participate: Yes Program Orientation Provided & Reviewed with Pt/Caregiver Including Roles  & Responsibilities: Yes  Decrease burden of Care through IP rehab admission: NA  Possible need for SNF placement upon discharge: Not anticipated  Patient Condition: I have reviewed medical records from [facility], spoken with CM, and patient and daughter. I met with patient at the bedside and discussed via phone for inpatient rehabilitation assessment.  Patient will benefit from ongoing PT, OT, and SLP, can actively participate in 3 hours of therapy a day 5 days of the week, and can make measurable gains during the admission.  Patient will also benefit from the coordinated team approach during an Inpatient Acute Rehabilitation admission.  The patient will receive intensive therapy as well as Rehabilitation physician, nursing, social worker, and care management interventions.  Due to safety, skin/wound care, disease management, medication administration, pain management, and patient education the patient requires 24 hour a day rehabilitation nursing.  The patient is currently overall min to mod assist with mobility and basic ADLs.  Discharge setting and therapy post discharge at  home with home health is anticipated.  Patient has agreed to participate in the Acute Inpatient Rehabilitation Program and will admit today.  Preadmission Screen Completed By:  Bethel Born, 02/10/2021 10:52 AM ______________________________________________________________________   Discussed status with Dr. Naaman Plummer on  02/10/2021 at  1055 and received approval for admission today.  Admission Coordinator:  Bethel Born, CCC-SLP, time  1055 Date 02/10/2021   Assessment/Plan: Diagnosis: right parafalcine SDH after fall Does the need for close, 24 hr/day Medical supervision in concert with the patient's rehab needs make it unreasonable for this patient to be served in a less intensive setting? Yes Co-Morbidities requiring supervision/potential complications: CKD III, hep c with elevated LFT's, pain mgt Due to bladder management, bowel management, safety, skin/wound care, disease management, medication administration, pain management, and patient education, does the patient require 24 hr/day rehab nursing? Yes Does the patient require coordinated care of a physician, rehab nurse, PT, OT, and SLP to address physical and functional deficits in the context of the above medical diagnosis(es)? Yes Addressing deficits  in the following areas: balance, endurance, locomotion, strength, transferring, bowel/bladder control, bathing, dressing, feeding, grooming, toileting, cognition, and psychosocial support Can the patient actively participate in an intensive therapy program of at least 3 hrs of therapy 5 days a week? Yes The potential for patient to make measurable gains while on inpatient rehab is excellent Anticipated functional outcomes upon discharge from inpatient rehab: supervision PT, supervision OT, supervision SLP Estimated rehab length of stay to reach the above functional goals is: 7-10 days Anticipated discharge destination: Home 10. Overall Rehab/Functional Prognosis:  excellent   MD Signature: [name], MD, [facility] 02/10/2021

## 2021-02-06 ENCOUNTER — Telehealth: Payer: Self-pay | Admitting: Emergency Medicine

## 2021-02-06 LAB — BASIC METABOLIC PANEL
Anion gap: 8 (ref 5–15)
BUN: 27 mg/dL — ABNORMAL HIGH (ref 8–23)
CO2: 24 mmol/L (ref 22–32)
Calcium: 8.7 mg/dL — ABNORMAL LOW (ref 8.9–10.3)
Chloride: 103 mmol/L (ref 98–111)
Creatinine, Ser: 1.08 mg/dL — ABNORMAL HIGH (ref 0.44–1.00)
GFR, Estimated: 52 mL/min — ABNORMAL LOW (ref >60)
Glucose, Bld: 133 mg/dL — ABNORMAL HIGH (ref 70–99)
Potassium: 3.6 mmol/L (ref 3.5–5.1)
Sodium: 135 mmol/L (ref 135–145)

## 2021-02-06 MED ORDER — SODIUM CHLORIDE 0.9 % IV SOLN: INTRAVENOUS | Status: DC

## 2021-02-06 NOTE — Telephone Encounter (Signed)
Post ED Visit - Positive Culture Follow-up  Culture report reviewed by antimicrobial stewardship pharmacist: [place] Team '[]'$  Elenor Quinones, Pharm.D. '[]'$  Heide Guile, Pharm.D., BCPS AQ-ID '[]'$  Parks Neptune, Pharm.D., BCPS '[]'$  Alycia Rossetti, Pharm.D., BCPS '[]'$  Sargent, Pharm.D., BCPS, AAHIVP '[]'$  Legrand Como, Pharm.D., BCPS, AAHIVP '[x]'$  Salome Arnt, PharmD, BCPS '[]'$  Johnnette Gourd, PharmD, BCPS '[]'$  Hughes Better, PharmD, BCPS '[]'$  Leeroy Cha, PharmD '[]'$  Laqueta Linden, PharmD, BCPS '[]'$  Albertina Parr, PharmD  [place] Team '[]'$  Leodis Sias, PharmD '[]'$  Lindell Spar, PharmD '[]'$  Royetta Asal, PharmD '[]'$  Graylin Shiver, Rph '[]'$  Rema Fendt) Glennon Mac, PharmD '[]'$  Arlyn Dunning, PharmD '[]'$  Netta Cedars, PharmD '[]'$  Dia Sitter, PharmD '[]'$  Leone Haven, PharmD '[]'$  Gretta Arab, PharmD '[]'$  Theodis Shove, PharmD '[]'$  Peggyann Juba, PharmD '[]'$  Reuel Boom, PharmD   Positive urine culture Treated with Cefdinir, organism sensitive to the same and no further patient follow-up is required at this time.  Sandi Raveling Shrita Thien 02/06/2021, 11:49 AM

## 2021-02-06 NOTE — Progress Notes (Signed)
PROGRESS NOTE   [name]  [id number] DOB: [birth year] DOA: 02/02/2021 PCP: Janith Lima, MD  Brief Narrative:  80 year old home dwelling white female-supposed to ambulate with walker known history of Alzheimer's dementia DM TY 2 HTN CKD 3B-- initial fall 9/13-came to ED-CT showed (scalp hematoma CT spine   [-] patient was started on cefdinir for suspected urinary tract infection Return 9/14 2/2 repeat fall hit back of head in addition Repeat CT head = 8 mm right parafalcine subdural hematoma left scalp hematoma 12 mm without fracture multilevel severe degenerative changes noted on CT neck  Hospital-Problem based course  Recurrent fall with 8 mm right parafalcine subdural hematoma, 12 mm left scalp hematoma without fracture neurosurgery is recommending holding aspirin for 1 week [May resume on 02/08/2021] Rpt scan 9/15 not concerning for significant worsening and neurosurgery have signed off UTI from prior to admission Urine cultures 9/13 growing >100,000 g Klebsiella Transitioned to Keflex tid to complete 3 days total ending on 9/17 Hypertension with possible orthostatic hypotension Orthostatics unable to be accomplished hold HCTZ 25, Avapro  continue propanolol 20 twice daily for now Mild azotemia Likely 2/2 poor po. Start saline 50 cc/h-rpt labs am   Moderately controlled DM TY 2 Resume metformin 500 twice daily a.m. 9/16 - resume Zocor Moderate to severe dementia Continue Namzaric (memantine/donepezil) 1 capsule at bedtime, continue Celexa 10 qd Is able to orient mainly to person cannot orient to place etc.    DVT prophylaxis: SCD Code Status: DNR Family Communication: discussed at bedside on 9/16 daughter #, [name] [phone]  Disposition:  Status is: Observation  The patient will require care spanning > 2 midnights and should be moved to inpatient because: Hemodynamically unstable and Unsafe d/c plan  Dispo: The patient is from: Home              Anticipated  d/c is to: CIR potentially if not accepted for rehab inpatient family wishes to take her home and will be able to supplement coverage if needed              Patient currently is medically stable to d/c.   Difficult to place patient No  Consultants:  Neurosurgery was telephone consulted on 9/14 by the ED  Procedures:   Antimicrobials:   ceftriaxone  Subjective:  Sleeping today o my assessment-I didn't disturb her No new other issues per RN  Objective: Vitals:   02/05/21 1943 02/05/21 2353 02/06/21 0316 02/06/21 0745  BP: (!) 167/93 (!) 146/65  (!) 157/68  Pulse: 81   (!) 59  Resp: '19 20 15 19  '$ Temp: 99.5 F (37.5 C) 97.9 F (36.6 C) 97.6 F (36.4 C) 98.5 F (36.9 C)  TempSrc: Oral Oral Oral Oral  SpO2:    97%    Intake/Output Summary (Last 24 hours) at 02/06/2021 1110 Last data filed at 02/05/2021 1245 Gross per 24 hour  Intake 360 ml  Output --  Net 360 ml    There were no vitals filed for this visit.  Examination:  Frail white female no distress No icterus no pallor Chest clear no rales rhonchi Abdomen soft   Data Reviewed: personally reviewed   CBC    Component Value Date/Time   WBC 9.8 02/04/2021 0131   RBC 3.23 (L) 02/04/2021 0131   HGB 9.1 (L) 02/04/2021 0131   HCT 28.3 (L) 02/04/2021 0131   PLT 418 (H) 02/04/2021 0131   MCV 87.6 02/04/2021 0131   MCH 28.2 02/04/2021 0131  MCHC 32.2 02/04/2021 0131   RDW 16.2 (H) 02/04/2021 0131   LYMPHSABS 2.5 02/04/2021 0131   MONOABS 0.9 02/04/2021 0131   EOSABS 0.3 02/04/2021 0131   BASOSABS 0.0 02/04/2021 0131   CMP Latest Ref Rng & Units 02/06/2021 02/05/2021 02/04/2021  Glucose 70 - 99 mg/dL 133(H) 109(H) 113(H)  BUN 8 - 23 mg/dL 27(H) 22 20  Creatinine 0.44 - 1.00 mg/dL 1.08(H) 1.09(H) 1.20(H)  Sodium 135 - 145 mmol/L 135 136 136  Potassium 3.5 - 5.1 mmol/L 3.6 3.7 3.7  Chloride 98 - 111 mmol/L 103 103 100  CO2 22 - 32 mmol/L '24 22 23  '$ Calcium 8.9 - 10.3 mg/dL 8.7(L) 8.9 8.7(L)  Total Protein 6.5  - 8.1 g/dL - - 6.1(L)  Total Bilirubin 0.3 - 1.2 mg/dL - - 1.1  Alkaline Phos 38 - 126 U/L - - 166(H)  AST 15 - 41 U/L - - 26  ALT 0 - 44 U/L - - 30     Radiology Studies: No results found.   Scheduled Meds:  cephALEXin  500 mg Oral Q8H   citalopram  10 mg Oral Daily   memantine  28 mg Oral QHS   And   donepezil  10 mg Oral QHS   mupirocin ointment   Nasal BID   propranolol  20 mg Oral BID   Continuous Infusions:  sodium chloride 50 mL/hr at 02/06/21 0953      LOS: 3 days   Time spent: [place], MD [facility] To contact the attending provider between 7A-7P or the covering provider during after hours 7P-7A, please log into the web site [email or web address] and access using universal [place] password for that web site. If you do not have the password, please call the hospital operator.  02/06/2021, 11:10 AM

## 2021-02-06 NOTE — Progress Notes (Signed)
Doctor addressed to me today that she can be discharged or transferred to rehab tomorrow.

## 2021-02-07 ENCOUNTER — Inpatient Hospital Stay (HOSPITAL_COMMUNITY): Payer: Medicare Other

## 2021-02-07 LAB — COMPREHENSIVE METABOLIC PANEL
ALT: 219 U/L — ABNORMAL HIGH (ref 0–44)
AST: 364 U/L — ABNORMAL HIGH (ref 15–41)
Albumin: 2.4 g/dL — ABNORMAL LOW (ref 3.5–5.0)
Alkaline Phosphatase: 386 U/L — ABNORMAL HIGH (ref 38–126)
Anion gap: 11 (ref 5–15)
BUN: 27 mg/dL — ABNORMAL HIGH (ref 8–23)
CO2: 22 mmol/L (ref 22–32)
Calcium: 8.4 mg/dL — ABNORMAL LOW (ref 8.9–10.3)
Chloride: 102 mmol/L (ref 98–111)
Creatinine, Ser: 1.14 mg/dL — ABNORMAL HIGH (ref 0.44–1.00)
GFR, Estimated: 48 mL/min — ABNORMAL LOW (ref >60)
Glucose, Bld: 120 mg/dL — ABNORMAL HIGH (ref 70–99)
Potassium: 3.8 mmol/L (ref 3.5–5.1)
Sodium: 135 mmol/L (ref 135–145)
Total Bilirubin: 1.2 mg/dL (ref 0.3–1.2)
Total Protein: 6 g/dL — ABNORMAL LOW (ref 6.5–8.1)

## 2021-02-07 LAB — IRON AND TIBC
Iron: 33 ug/dL (ref 28–170)
Saturation Ratios: 10 % — ABNORMAL LOW (ref 10.4–31.8)
TIBC: 323 ug/dL (ref 250–450)
UIBC: 290 ug/dL

## 2021-02-07 LAB — FERRITIN: Ferritin: 39 ng/mL (ref 11–307)

## 2021-02-07 LAB — HEPATITIS PANEL, ACUTE
HCV Ab: REACTIVE — AB
Hep A IgM: NONREACTIVE
Hep B C IgM: NONREACTIVE
Hepatitis B Surface Ag: NONREACTIVE

## 2021-02-07 LAB — HEPATITIS B SURFACE ANTIGEN: Hepatitis B Surface Ag: NONREACTIVE

## 2021-02-07 NOTE — Progress Notes (Signed)
PROGRESS NOTE   [name]  [id number] DOB: 05/08/1941 DOA: 02/02/2021 PCP: Janith Lima, MD  Brief Narrative:  80 year old home dwelling white female-supposed to ambulate with walker known history of Alzheimer's dementia DM TY 2 HTN CKD 3B-- initial fall 9/13-came to ED-CT showed (scalp hematoma CT spine   [-] patient was started on cefdinir for suspected urinary tract infection Return 9/14 2/2 repeat fall hit back of head in addition Repeat CT head = 8 mm right parafalcine subdural hematoma left scalp hematoma 12 mm without fracture multilevel severe degenerative changes noted on CT neck Also treated for Klebsiella  urinary infection this admission  Hospital-Problem based course  Recurrent fall with 8 mm right parafalcine subdural hematoma, 12 mm left scalp hematoma without fracture Neurosurgery rec holding aspirin for 1 week resume on 02/08/2021] Rpt scan 9/15 not concerning for significant worsening and neurosurgery have signed off UTI from prior to admission Urine cultures 9/13 growing >100,000 g Klebsiella Transitioned to Keflex tid to complete 3 days total ending on 9/17 Elevated transaminases No prior blood transfusion noted per daughter-ultrasound RUQ shows a 8 mm GB polyp -- Patient asymptomatic This is possibly secondary to either ceftriaxone or Keflex which has been discontinued Recheck labs in a.m.--if still elevated will get dedicated HIDA scan to rule out cholecystitis although she is asymptomatic At this time would hold Zocor Hypertension with possible orthostatic hypotension Orthostatics unable to be accomplished hold HCTZ 25, Avapro now continue propanolol 20 twice daily for now May need to add amlodipine depending on renal function Mild azotemia Likely 2/2 poor po.  Continue saline 50 cc/h-rpt labs am   Moderately controlled DM TY 2 Metformin 500 bid held--only eating 50% meals  Am sugars reasonable Moderate to severe dementia Continue Namzaric  (memantine/donepezil) 1 capsule at bedtime, continue Celexa 10 qd Orientable    DVT prophylaxis: SCD Code Status: DNR Family Communication: discussed at bedside on 9/19  daughter #, [name] [phone]  Disposition:  Status is: Observation  The patient will require care spanning > 2 midnights and should be moved to inpatient because: Hemodynamically unstable and Unsafe d/c plan  Dispo: The patient is from: Home              Anticipated d/c is to: CIR potentially if not accepted for rehab inpatient family wishes to take her home and will be able to supplement coverage if needed              Patient currently is medically stable to d/c.   Difficult to place patient No  Consultants:  Neurosurgery was telephone consulted on 9/14 by the ED  Procedures:   Antimicrobials:   ceftriaxone  Subjective:  Sleeping today o my assessment-I didn't disturb her No new other issues per RN  Objective: Vitals:   02/06/21 2200 02/06/21 2345 02/07/21 0334 02/07/21 0802  BP:  (!) 157/58  (!) 156/64  Pulse:    61  Resp: 16 (!) '21 20 19  '$ Temp:  98 F (36.7 C) 98 F (36.7 C) 97.9 F (36.6 C)  TempSrc:  Oral Oral Oral  SpO2:    96%    Intake/Output Summary (Last 24 hours) at 02/07/2021 1046 Last data filed at 02/07/2021 0500 Gross per 24 hour  Intake --  Output 500 ml  Net -500 ml    There were no vitals filed for this visit.  Examination:  Cachectic frail pleasant no distress S1-S2 no murmur Chest is clear no added sound Abdomen soft cannot appreciate  any thyromegaly any splenomegaly No lower extremity edema  Data Reviewed: personally reviewed   CBC    Component Value Date/Time   WBC 9.8 02/04/2021 0131   RBC 3.23 (L) 02/04/2021 0131   HGB 9.1 (L) 02/04/2021 0131   HCT 28.3 (L) 02/04/2021 0131   PLT 418 (H) 02/04/2021 0131   MCV 87.6 02/04/2021 0131   MCH 28.2 02/04/2021 0131   MCHC 32.2 02/04/2021 0131   RDW 16.2 (H) 02/04/2021 0131   LYMPHSABS 2.5 02/04/2021 0131    MONOABS 0.9 02/04/2021 0131   EOSABS 0.3 02/04/2021 0131   BASOSABS 0.0 02/04/2021 0131   CMP Latest Ref Rng & Units 02/07/2021 02/06/2021 02/05/2021  Glucose 70 - 99 mg/dL 120(H) 133(H) 109(H)  BUN 8 - 23 mg/dL 27(H) 27(H) 22  Creatinine 0.44 - 1.00 mg/dL 1.14(H) 1.08(H) 1.09(H)  Sodium 135 - 145 mmol/L 135 135 136  Potassium 3.5 - 5.1 mmol/L 3.8 3.6 3.7  Chloride 98 - 111 mmol/L 102 103 103  CO2 22 - 32 mmol/L '22 24 22  '$ Calcium 8.9 - 10.3 mg/dL 8.4(L) 8.7(L) 8.9  Total Protein 6.5 - 8.1 g/dL 6.0(L) - -  Total Bilirubin 0.3 - 1.2 mg/dL 1.2 - -  Alkaline Phos 38 - 126 U/L 386(H) - -  AST 15 - 41 U/L 364(H) - -  ALT 0 - 44 U/L 219(H) - -     Radiology Studies: US Abdomen Limited RUQ (LIVER/GB)  Result Date: 02/07/2021 CLINICAL DATA:  Elevated liver function tests. EXAM: ULTRASOUND ABDOMEN LIMITED RIGHT UPPER QUADRANT COMPARISON:  None. FINDINGS: Gallbladder: No gallbladder wall thickening or shadowing gallstone. Small echogenic focus along the posterior wall noted on image 28 could be a sessile polyp or some sludge. The significance is doubtful. Common bile duct: Diameter: 1.5 mm.  Normal. Liver: No focal lesion identified. Within normal limits in parenchymal echogenicity. Portal vein is patent on color Doppler imaging with normal direction of blood flow towards the liver. Other: None. IMPRESSION: Normal study with exception of an 8 mm echogenic focus along the posterior wall of the gallbladder image 28. This could be some sludge or a sessile polyp. The significance is doubtful. Electronically Signed   By: Nelson Chimes M.D.   On: 02/07/2021 09:54     Scheduled Meds:  citalopram  10 mg Oral Daily   memantine  28 mg Oral QHS   And   donepezil  10 mg Oral QHS   mupirocin ointment   Nasal BID   propranolol  20 mg Oral BID   Continuous Infusions:  sodium chloride 50 mL/hr at 02/06/21 0953      LOS: 4 days   Time spent: [place], MD [facility] To contact the  attending provider between 7A-7P or the covering provider during after hours 7P-7A, please log into the web site [email or web address] and access using universal [place] password for that web site. If you do not have the password, please call the hospital operator.  02/07/2021, 10:46 AM

## 2021-02-07 NOTE — Progress Notes (Signed)
Inpatient Rehab Admissions Coordinator:  Saw pt and daughter, [name] at bedside. Informed them that per attending, pt not medically ready to be admitted today. Will continue to follow.   Gayland Curry, [place], Frannie Admissions Coordinator [phone]

## 2021-02-07 NOTE — Progress Notes (Signed)
Physical Therapy Treatment Patient Details Name: [name] MRN: [id number] DOB: 01-24-1941 Today's Date: 02/07/2021   History of Present Illness Pt is a 80yo female presenting to [facility] ED on 9/14 after a fall where she hit her head. CT revealed subdural hematoma on the R. She came to [facility] ED on 9/13 after a different fall and was discharged. PMH: HTN, DM, dementia, history of falling, cervical spine fusion C4-C7.    PT Comments    Pt up in chair upon PT arrival to room, requiring encouragement to participate in gait training. Pt ambulatory for room distance with HHA and light steadying assist, pt refused to use RW appropriately even with repeated attempts and cuing. Pt repeated over and over during session "I want to go home!". Will need post-acute rehab at this point, will continue to follow acutely.    Recommendations for follow up therapy are one component of a multi-disciplinary discharge planning process, led by the attending physician.  Recommendations may be updated based on patient status, additional functional criteria and insurance authorization.  Follow Up Recommendations  CIR;SNF (CIR vs SNF, awaiting decision from CIR)     Equipment Recommendations  3in1 (PT)    Recommendations for Other Services       Precautions / Restrictions Precautions Precautions: Fall Precaution Comments: Multiple recent falls. Restrictions Weight Bearing Restrictions: No     Mobility  Bed Mobility Overal bed mobility: Needs Assistance             General bed mobility comments: up in recliner upon PT arrival to room, in recliner at PT exit.    Transfers Overall transfer level: Needs assistance Equipment used: 1 person hand held assist Transfers: Sit to/from Stand Sit to Stand: Min assist         General transfer comment: min assist for power up and steadying, attempted to use RW but pt abandoned HHA mid-stand requiring PT to intervene and provide  HHA.  Ambulation/Gait Ambulation/Gait assistance: Min assist Gait Distance (Feet): 30 Feet Assistive device: 1 person hand held assist Gait Pattern/deviations: Step-through pattern;Decreased stride length;Shuffle;Narrow base of support Gait velocity: decr   General Gait Details: light assist to steady, guide pt trajectory. PT attempted to have pt use RW, but pt kept pushing it away even with cuing.   Stairs             Wheelchair Mobility    Modified Rankin (Stroke Patients Only)       Balance Overall balance assessment: Needs assistance;History of Falls Sitting-balance support: Feet supported;Single extremity supported Sitting balance-Leahy Scale: Fair     Standing balance support: During functional activity;Single extremity supported Standing balance-Leahy Scale: Poor Standing balance comment: reliant on PT assist                            Cognition Arousal/Alertness: Awake/alert Behavior During Therapy: Flat affect Overall Cognitive Status: History of cognitive impairments - at baseline                                 General Comments: Pt has history of dementia, oriented to self only. Pt increasingly irritable during session, stating "I want to go home!" over and over at end of session.      Exercises General Exercises - Lower Extremity Ankle Circles/Pumps: AROM;Both;15 reps;Seated Long Arc Quad: AAROM;Both;15 reps;Seated Hip ABduction/ADduction: AROM;Both;15 reps;Seated    General Comments  Pertinent Vitals/Pain Pain Assessment: Faces Faces Pain Scale: No hurt Pain Intervention(s): Monitored during session    Home Living                      Prior Function            PT Goals (current goals can now be found in the care plan section) Acute Rehab PT Goals Patient Stated Goal: go home PT Goal Formulation: With patient Time For Goal Achievement: 02/17/21 Potential to Achieve Goals: Fair Progress  towards PT goals: Progressing toward goals    Frequency    Min 3X/week      PT Plan Current plan remains appropriate    Co-evaluation              AM-PAC PT "6 Clicks" Mobility   Outcome Measure  Help needed turning from your back to your side while in a flat bed without using bedrails?: A Little Help needed moving from lying on your back to sitting on the side of a flat bed without using bedrails?: A Lot Help needed moving to and from a bed to a chair (including a wheelchair)?: A Lot Help needed standing up from a chair using your arms (e.g., wheelchair or bedside chair)?: A Little Help needed to walk in hospital room?: A Little Help needed climbing 3-5 steps with a railing? : A Lot 6 Click Score: 15    End of Session Equipment Utilized During Treatment: Gait belt Activity Tolerance: Patient tolerated treatment well Patient left: in chair;with call bell/phone within reach;with chair alarm set;with family/visitor present (posey waist alarm in recliner) Nurse Communication: Mobility status PT Visit Diagnosis: History of falling (Z91.81);Muscle weakness (generalized) (M62.81);Unsteadiness on feet (R26.81);Pain     Time: 1213-1229 PT Time Calculation (min) (ACUTE ONLY): 16 min  Charges:  $Gait Training: 8-22 mins                     Stacie Glaze, PT DPT Acute Rehabilitation Services Pager [phone]  Office [phone]    [place] 02/07/2021, 1:29 PM

## 2021-02-07 NOTE — Care Management Important Message (Signed)
Important Message  Patient Details  Name: [name] MRN: [id number] Date of Birth: 11-18-1940   Medicare Important Message Given:  Yes     Orbie Pyo 02/07/2021, 3:33 PM

## 2021-02-07 NOTE — H&P (Signed)
Physical Medicine and Rehabilitation Admission H&P    Chief Complaint  Patient presents with   Fall  : HPI: [name] is a 80 year old right-handed female with history of memory loss maintained on Namenda/Aricept, diabetes mellitus, hypertension, CKD stage III, ACDF 12/22/2014, quit smoking 25 years ago.  Per chart review patient lives with spouse.  1 level home 2 steps to entry.  Patient was using a rolling walker prior to admission but needing prompting as well as noted falls.  Husband assist with ADLs.  Presented 02/02/2021 with recurrent falls.  Spouse does have a part-time job and family provide supervision when he is out.  She was found to have an abrasion on the back of her head that did require 5 staples in the ED cranial CT scan showed a left occipital scalp 12 mm hematoma with no underlying calvarial fracture as well as 8 mm right parafalcine subdural hemorrhage extending inferiorly along the right tentorium.  CT cervical spine negative.  Admission chemistries unremarkable except glucose 154, creatinine 1.03, hemoglobin 10.5, urine culture greater 100,000 Klebsiella and placed on Keflex completing course of antibiotic.  Follow-up neurosurgery Dr. Duffy Rhody in regards to traumatic SDH advised conservative care with latest cranial CT scan showing no new or enlarging intracranial hemorrhage.  Patient was on low-dose aspirin 81 mg daily prior to admission advised to hold x1 week.  Bouts of orthostasis HCTZ was held.  Findings of elevated LFTs 02/07/2021 and monitored with ultrasound right upper quad showing an 8 mm GB polyp.  Her hep C antibody was positive.  Patient asymptomatic and reviewed with gastroenterology Dr. Carlean Purl and felt elevated LFTs related to recent antibiotic therapy for UTI although alk phos appears to been elevated since March 2021.. .  Consideration for HIDA scan if remains elevated..  Patient's LFTs continue trending downward with improvement.  She is currently on a  dysphagia #2 thin liquid diet.  Therapy evaluations completed due to patient decreased functional mobility cognitive deficits was admitted for a comprehensive rehab program.    Review of Systems  Constitutional:  Negative for chills and fever.  HENT:  Negative for hearing loss.   Eyes:  Negative for blurred vision and double vision.  Respiratory:  Negative for cough and shortness of breath.   Cardiovascular:  Negative for chest pain, palpitations and leg swelling.  Gastrointestinal:  Positive for constipation. Negative for heartburn, nausea and vomiting.  Genitourinary:  Negative for dysuria, flank pain and hematuria.  Musculoskeletal:  Positive for falls, joint pain and myalgias.  Skin:  Negative for rash.  Psychiatric/Behavioral:  Positive for depression and memory loss.   All other systems reviewed and are negative. Past Medical History:  Diagnosis Date   Contact dermatitis and other eczema, due to unspecified cause    Displacement of lumbar intervertebral disc without myelopathy    FH: cataracts    Gout, unspecified    Memory loss    Occlusion and stenosis of carotid artery without mention of cerebral infarction    Other and unspecified hyperlipidemia    Pain in joint, shoulder region    Pain in limb    Routine general medical examination at a health care facility    Sciatica    Type II or unspecified type diabetes mellitus without mention of complication, uncontrolled    Unspecified essential hypertension    Unspecified venous (peripheral) insufficiency    Past Surgical History:  Procedure Laterality Date   ANTERIOR CERVICAL DECOMP/DISCECTOMY FUSION N/A 12/22/2014   Procedure:  Anterior Cervical Four-Five/Five-Six/Six-Seven Decompression/Diskectomy/ and Fsion;  Surgeon: Leeroy Cha, MD;  Location: [facility];  Service: Neurosurgery;  Laterality: N/A;  C4-5 C5-6 C6-7 Anterior cervical decompression/diskectomy/fusion   LAPAROSCOPIC APPENDECTOMY     LUMBAR LAMINECTOMY  07/2008    [place]   TUBAL LIGATION     Family History  Problem Relation Age of Onset   Alzheimer's disease Mother    Diabetes Mother    Coronary artery disease Father        CABG   Hypertension Father    Cancer Brother    Cancer Brother    Colon cancer Neg Hx    Breast cancer Neg Hx    Social History:  reports that she quit smoking about 25 years ago. Her smoking use included cigarettes. She has never used smokeless tobacco. She reports that she does not drink alcohol and does not use drugs. Allergies:  Allergies  Allergen Reactions   Pneumococcal Vaccines Swelling    Per pt when received 2015   Wound Dressing Adhesive Other (See Comments)    Band-Aids will PULL OFF THE SKIN if left on for a decent amount of time   Ampicillin Hives   Neomycin-Bacitracin Zn-Polymyx Rash   Penicillins Hives   Medications Prior to Admission  Medication Sig Dispense Refill   aspirin 81 MG EC tablet Take 1 tablet (81 mg total) by mouth daily. Swallow whole. 30 tablet 12   cefdinir (OMNICEF) 300 MG capsule Take 1 capsule (300 mg total) by mouth 2 (two) times daily. (Patient taking differently: Take 300 mg by mouth 2 (two) times daily. Start date: 02/02/21) 14 capsule 0   citalopram (CELEXA) 20 MG tablet Take 1 tablet (20 mg total) by mouth daily. (Patient taking differently: Take 10 mg by mouth daily.) 30 tablet 5   hydrochlorothiazide (HYDRODIURIL) 25 MG tablet TAKE 1 TABLET(25 MG) BY MOUTH DAILY (Patient taking differently: Take 25 mg by mouth in the morning.) 90 tablet 1   irbesartan (AVAPRO) 150 MG tablet Take 150 mg by mouth daily.     levocetirizine (XYZAL) 5 MG tablet TAKE 1 TABLET(5 MG) BY MOUTH EVERY EVENING (Patient taking differently: Take 5 mg by mouth every evening.) 90 tablet 1   metFORMIN (GLUCOPHAGE) 500 MG tablet TAKE 1 TABLET(500 MG) BY MOUTH TWICE DAILY WITH A MEAL (Patient taking differently: Take 500 mg by mouth 2 (two) times daily with a meal.) 180 tablet 1   propranolol (INDERAL) 20 MG  tablet TAKE 1 TABLET(20 MG) BY MOUTH TWICE DAILY (Patient taking differently: Take 20 mg by mouth 2 (two) times daily.) 180 tablet 0   simvastatin (ZOCOR) 20 MG tablet TAKE 1 TABLET(20 MG) BY MOUTH DAILY AT 6 PM (Patient taking differently: Take 20 mg by mouth every evening.) 90 tablet 1   Memantine HCl-Donepezil HCl (NAMZARIC) 28-10 MG CP24 Take 1 capsule at bedtime (Patient not taking: No sig reported) 90 capsule 1    Drug Regimen Review Drug regimen was reviewed and remains appropriate with no significant issues identified  Home: Home Living Family/patient expects to be discharged to:: Private residence Living Arrangements: Spouse/significant other Available Help at Discharge: Family, Available 24 hours/day Type of Home: House Home Access: Stairs to enter [employer] of Steps: 2 Entrance Stairs-Rails: Left Home Layout: One level Bathroom Shower/Tub: Chiropodist: Standard Bathroom Accessibility: No (daughter  unsure if a walker would fit inside bathroom) Home Equipment: Environmental consultant - 2 wheels, Cane - single point, Civil engineer, contracting  Lives With: Significant other  Functional History: Prior Function Level of Independence: Needs assistance Gait / Transfers Assistance Needed: chart suggests patient was supposed to walk with a RW ADL's / Homemaking Assistance Needed: Assume assist with home management, meds, meal prep, community mobility and ADL support  Functional Status:  Mobility: Bed Mobility Overal bed mobility: Needs Assistance Bed Mobility: Supine to Sit Supine to sit: Min assist, HOB elevated Sit to supine: Mod assist General bed mobility comments: in recliner Transfers Overall transfer level: Needs assistance Equipment used: Rolling walker (2 wheeled) Transfers: Sit to/from Stand Sit to Stand: Mod assist Stand pivot transfers: Min assist General transfer comment: lifting help to stand from recliner, pt placing hands on  walker Ambulation/Gait Ambulation/Gait assistance: Min assist Gait Distance (Feet): 100 Feet Assistive device: Rolling walker (2 wheeled) Gait Pattern/deviations: Step-to pattern, Decreased stride length, Shuffle General Gait Details: cues for proximity to walker, assist and increased time for turning walker, pt with short shuffling steps Gait velocity: decr    ADL: ADL Overall ADL's : Needs assistance/impaired Grooming: Wash/dry hands, Min guard, Standing Grooming Details (indicate cue type and reason): stood at sink for 3 minutes. Required cues for where to turn on water, where to get soap and where to get paper towels even when cued to attempt to find these things. Upper Body Bathing: Minimal assistance, Sitting Upper Body Bathing Details (indicate cue type and reason): throughness Lower Body Bathing: Moderate assistance, Sit to/from stand Upper Body Dressing : Minimal assistance, Sitting Lower Body Dressing: Moderate assistance, Sit to/from stand Toilet Transfer: Minimal assistance, Ambulation, Regular Toilet, Grab bars, RW Toilet Transfer Details (indicate cue type and reason): Pt walked to bathroom with walker and min assist. Pt could not find toilet in the bathroom without max cues.  Pt was wearning glasses. Toileting- Clothing Manipulation and Hygiene: Moderate assistance, Sitting/lateral lean, Sit to/from stand Toileting - Clothing Manipulation Details (indicate cue type and reason): Pt unable to figure out what to do with toilet paper when placed in her hand.  Family stated this has become increasingly difficult at home. Functional mobility during ADLs: Minimal assistance General ADL Comments: Pt with sigificant cognitive deficits that affect ability to be safe with adls.  Cognition: Cognition Overall Cognitive Status: History of cognitive impairments - at baseline Arousal/Alertness: Awake/alert Orientation Level: Oriented to person Attention: Focused Focused Attention:  Impaired Focused Attention Impairment: Verbal basic, Functional basic Memory: Impaired Memory Impairment: Decreased long term memory, Decreased short term memory Awareness: Impaired Awareness Impairment: Intellectual impairment, Emergent impairment, Anticipatory impairment Problem Solving: Impaired Problem Solving Impairment: Verbal basic, Functional basic Safety/Judgment: Impaired Cognition Arousal/Alertness: Awake/alert Behavior During Therapy: Flat affect Overall Cognitive Status: History of cognitive impairments - at baseline General Comments: Pt with history of dementia.  Oriented person only.  Not sure why at hospital.  Has difficulty following directional commands.  Physical Exam: Blood pressure 128/82, pulse 69, temperature 98.5 F (36.9 C), temperature source Oral, resp. rate 18, SpO2 95 %. Physical Exam Constitutional:      Appearance: Normal appearance.  HENT:     Head: Normocephalic.     Comments: Scalp incision clean and dry    Right Ear: External ear normal.     Left Ear: External ear normal.     Nose: Nose normal.     Mouth/Throat:     Mouth: Mucous membranes are dry.  Eyes:     Extraocular Movements: Extraocular movements intact.     Pupils: Pupils are equal, round, and reactive to light.  Cardiovascular:  Rate and Rhythm: Normal rate and regular rhythm.     Heart sounds: No murmur heard. Pulmonary:     Effort: Pulmonary effort is normal. No respiratory distress.     Breath sounds: No wheezing.  Abdominal:     General: Bowel sounds are normal. There is no distension.     Palpations: Abdomen is soft.  Musculoskeletal:     Cervical back: Normal range of motion.     Comments: Right metarsal area and 1st MTP red,extremely tender to touch, swollen.  Skin:    General: Skin is warm.     Comments: Skin of both lower extremities and feet very dry.  Neurological:     Mental Status: She is alert.     Comments: Patient is alert and made eye contact with  examiner.  Follows simple commands.  Provides her name only.  Patient is pleasantly confused and very tearful at times. Oriented to person only. Normal language. UE 4/5. LE 3-4/5 prox to 4/5 distal with RLE limited by pain in right foot. Sensation intact to PP and LT  Psychiatric:     Comments: Flat but generally cooperate    Results for orders placed or performed during the hospital encounter of 02/02/21 (from the past 48 hour(s))  Comprehensive metabolic panel     Status: Abnormal   Collection Time: 02/09/21  3:40 AM  Result Value Ref Range   Sodium 135 135 - 145 mmol/L   Potassium 3.5 3.5 - 5.1 mmol/L   Chloride 101 98 - 111 mmol/L   CO2 24 22 - 32 mmol/L   Glucose, Bld 138 (H) 70 - 99 mg/dL    Comment: Glucose reference range applies only to samples taken after fasting for at least 8 hours.   BUN 21 8 - 23 mg/dL   Creatinine, Ser 1.03 (H) 0.44 - 1.00 mg/dL   Calcium 8.9 8.9 - 10.3 mg/dL   Total Protein 6.9 6.5 - 8.1 g/dL   Albumin 2.8 (L) 3.5 - 5.0 g/dL   AST 127 (H) 15 - 41 U/L   ALT 156 (H) 0 - 44 U/L   Alkaline Phosphatase 447 (H) 38 - 126 U/L   Total Bilirubin 1.0 0.3 - 1.2 mg/dL   GFR, Estimated 55 (L) >60 mL/min    Comment: (NOTE) Calculated using the CKD-EPI Creatinine Equation (2021)    Anion gap 10 5 - 15    Comment: Performed at [facility], [place] [address] [place], [place] [address]  CBC with Differential/Platelet     Status: Abnormal   Collection Time: 02/09/21  3:40 AM  Result Value Ref Range   WBC 13.3 (H) 4.0 - 10.5 K/uL   RBC 3.82 (L) 3.87 - 5.11 MIL/uL   Hemoglobin 10.8 (L) 12.0 - 15.0 g/dL   HCT 33.1 (L) 36.0 - 46.0 %   MCV 86.6 80.0 - 100.0 fL   MCH 28.3 26.0 - 34.0 pg   MCHC 32.6 30.0 - 36.0 g/dL   RDW 16.0 (H) 11.5 - 15.5 %   Platelets 478 (H) 150 - 400 K/uL   nRBC 0.0 0.0 - 0.2 %   Neutrophils Relative % 69 %   Neutro Abs 9.3 (H) 1.7 - 7.7 K/uL   Lymphocytes Relative 17 %   Lymphs Abs 2.2 0.7 - 4.0 K/uL   Monocytes Relative 9 %    Monocytes Absolute 1.2 (H) 0.1 - 1.0 K/uL   Eosinophils Relative 4 %   Eosinophils Absolute 0.5 0.0 - 0.5 K/uL  Basophils Relative 0 %   Basophils Absolute 0.1 0.0 - 0.1 K/uL   Immature Granulocytes 1 %   Abs Immature Granulocytes 0.06 0.00 - 0.07 K/uL    Comment: Performed at [facility], [place] [address], [place], [place] [address]  Comprehensive metabolic panel     Status: Abnormal   Collection Time: 02/10/21  3:50 AM  Result Value Ref Range   Sodium 136 135 - 145 mmol/L   Potassium 3.4 (L) 3.5 - 5.1 mmol/L   Chloride 102 98 - 111 mmol/L   CO2 24 22 - 32 mmol/L   Glucose, Bld 118 (H) 70 - 99 mg/dL    Comment: Glucose reference range applies only to samples taken after fasting for at least 8 hours.   BUN 25 (H) 8 - 23 mg/dL   Creatinine, Ser 1.03 (H) 0.44 - 1.00 mg/dL   Calcium 9.0 8.9 - 10.3 mg/dL   Total Protein 6.7 6.5 - 8.1 g/dL   Albumin 2.8 (L) 3.5 - 5.0 g/dL   AST 74 (H) 15 - 41 U/L   ALT 114 (H) 0 - 44 U/L   Alkaline Phosphatase 397 (H) 38 - 126 U/L   Total Bilirubin 1.2 0.3 - 1.2 mg/dL   GFR, Estimated 55 (L) >60 mL/min    Comment: (NOTE) Calculated using the CKD-EPI Creatinine Equation (2021)    Anion gap 10 5 - 15    Comment: Performed at [facility], [place] [address] [place], [place] [address]  CBC with Differential/Platelet     Status: Abnormal   Collection Time: 02/10/21  3:50 AM  Result Value Ref Range   WBC 14.1 (H) 4.0 - 10.5 K/uL   RBC 3.60 (L) 3.87 - 5.11 MIL/uL   Hemoglobin 10.0 (L) 12.0 - 15.0 g/dL   HCT 31.3 (L) 36.0 - 46.0 %   MCV 86.9 80.0 - 100.0 fL   MCH 27.8 26.0 - 34.0 pg   MCHC 31.9 30.0 - 36.0 g/dL   RDW 15.9 (H) 11.5 - 15.5 %   Platelets 496 (H) 150 - 400 K/uL   nRBC 0.0 0.0 - 0.2 %   Neutrophils Relative % 67 %   Neutro Abs 9.5 (H) 1.7 - 7.7 K/uL   Lymphocytes Relative 20 %   Lymphs Abs 2.8 0.7 - 4.0 K/uL   Monocytes Relative 9 %   Monocytes Absolute 1.3 (H) 0.1 - 1.0 K/uL   Eosinophils Relative 3 %   Eosinophils  Absolute 0.4 0.0 - 0.5 K/uL   Basophils Relative 0 %   Basophils Absolute 0.1 0.0 - 0.1 K/uL   Immature Granulocytes 1 %   Abs Immature Granulocytes 0.07 0.00 - 0.07 K/uL    Comment: Performed at [facility], [place] [address], [place], [place] [address]   No results found.     Medical Problem List and Plan: 1.  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  Conservative care per neurosurgery.Remove Scalp staples in 7-14 days.  -patient may  shower  -ELOS/Goals: 7-10 days, supervision goals 2.  Antithrombotics: -DVT/anticoagulation:  Mechanical: Sequential compression devices, below knee Bilateral lower extremities  -antiplatelet therapy: Aspirin 81 mg daily on hold x1 week 3. Pain Management/Gout flare right foot: Tylenol as needed  -prednisone taper, avoid tight fitting shoes and socks 4. Mood: Celexa 10 mg daily, Namenda 28 mg nightly, Aricept 10 mg daily  -antipsychotic agents: N/A 5. Neuropsych: This patient is  not capable of making decisions on her own behalf. 6. Skin/Wound Care: Routine skin checks  -eucerin cream BLE 7. Fluids/Electrolytes/Nutrition: Routine in and outs with follow-up chemistries 8.  CKD.  Creatinine baseline 1.03-1.39.  Follow-up chemistries 9.  Diabetes mellitus.  Hemoglobin A1c 6.2.  Metformin 500 mg twice daily prior to admission and resume as needed. 10.  Klebsiella UTI.  Keflex completed. 11.  Hyperlipidemia.  Hold Zocor 20 mg daily secondary to elevated LFTs 12.  Orthostasis.  Currently on Inderal 20 mg twice daily as well as low-dose Norvasc 5 mg daily initiated 02/08/2021.   13.Elevated Transaminases.  Ultrasound right upper quadrant showed 8 mm GB polyp.  Felt possibly due to recent antibiotic therapy for UTI.  Consider HIDA scan if remains elevated.  Patient asymptomatic at present. Has good appetite 14.  Dysphagia.  Dysphagia #2 thin liquids.  Speech therapy follow-up     Cathlyn Parsons, PA-C 02/10/2021

## 2021-02-07 NOTE — Progress Notes (Signed)
SLP Cancellation Note  Patient Details Name: [name] MRN: [id number] DOB: 06/07/1940   Cancelled treatment:        Reason eval/treat not completed: Pt refused. SLP will follow up tomorrow.   Dewitt Rota, SLP-Student    Dewitt Rota 02/07/2021, 1:59 PM

## 2021-02-07 NOTE — Care Management Important Message (Signed)
Important Message  Patient Details  Name: [name] MRN: [id number] Date of Birth: [birth year]   Medicare Important Message Given:  Yes     Memory Argue 02/07/2021, 12:44 PM

## 2021-02-07 NOTE — Progress Notes (Signed)
Attempted to see pt. Pt at test this am. Will check back as schedule allows.  Jinger Neighbors, [place] [phone]

## 2021-02-08 DIAGNOSIS — R768 Other specified abnormal immunological findings in serum: Secondary | ICD-10-CM | POA: Diagnosis not present

## 2021-02-08 DIAGNOSIS — R748 Abnormal levels of other serum enzymes: Secondary | ICD-10-CM

## 2021-02-08 LAB — CK: Total CK: 44 U/L (ref 38–234)

## 2021-02-08 LAB — CBC WITH DIFFERENTIAL/PLATELET
Abs Immature Granulocytes: 0.04 10*3/uL (ref 0.00–0.07)
Basophils Absolute: 0.1 10*3/uL (ref 0.0–0.1)
Basophils Relative: 1 %
Eosinophils Absolute: 0.4 10*3/uL (ref 0.0–0.5)
Eosinophils Relative: 4 %
HCT: 29.1 % — ABNORMAL LOW (ref 36.0–46.0)
Hemoglobin: 9.7 g/dL — ABNORMAL LOW (ref 12.0–15.0)
Immature Granulocytes: 0 %
Lymphocytes Relative: 26 %
Lymphs Abs: 2.6 10*3/uL (ref 0.7–4.0)
MCH: 28.8 pg (ref 26.0–34.0)
MCHC: 33.3 g/dL (ref 30.0–36.0)
MCV: 86.4 fL (ref 80.0–100.0)
Monocytes Absolute: 0.9 10*3/uL (ref 0.1–1.0)
Monocytes Relative: 9 %
Neutro Abs: 6 10*3/uL (ref 1.7–7.7)
Neutrophils Relative %: 60 %
Platelets: 416 10*3/uL — ABNORMAL HIGH (ref 150–400)
RBC: 3.37 MIL/uL — ABNORMAL LOW (ref 3.87–5.11)
RDW: 16.3 % — ABNORMAL HIGH (ref 11.5–15.5)
WBC: 9.9 10*3/uL (ref 4.0–10.5)
nRBC: 0 % (ref 0.0–0.2)

## 2021-02-08 LAB — COMPREHENSIVE METABOLIC PANEL
ALT: 175 U/L — ABNORMAL HIGH (ref 0–44)
AST: 183 U/L — ABNORMAL HIGH (ref 15–41)
Albumin: 2.6 g/dL — ABNORMAL LOW (ref 3.5–5.0)
Alkaline Phosphatase: 355 U/L — ABNORMAL HIGH (ref 38–126)
Anion gap: 8 (ref 5–15)
BUN: 21 mg/dL (ref 8–23)
CO2: 22 mmol/L (ref 22–32)
Calcium: 8.6 mg/dL — ABNORMAL LOW (ref 8.9–10.3)
Chloride: 105 mmol/L (ref 98–111)
Creatinine, Ser: 0.91 mg/dL (ref 0.44–1.00)
GFR, Estimated: >60 mL/min (ref >60)
Glucose, Bld: 101 mg/dL — ABNORMAL HIGH (ref 70–99)
Potassium: 3.6 mmol/L (ref 3.5–5.1)
Sodium: 135 mmol/L (ref 135–145)
Total Bilirubin: 1 mg/dL (ref 0.3–1.2)
Total Protein: 6.2 g/dL — ABNORMAL LOW (ref 6.5–8.1)

## 2021-02-08 MED ORDER — AMLODIPINE BESYLATE 5 MG PO TABS
5.0000 mg | ORAL_TABLET | Freq: Every day | ORAL | Status: DC
  Administered 2021-02-08 – 2021-02-10 (×3): 5 mg via ORAL
  Filled 2021-02-08 (×3): qty 1

## 2021-02-08 NOTE — Progress Notes (Signed)
Inpatient Rehabilitation Admissions Coordinator   I met with patient , daughter, and spouse at bedside. I await medical work up completion to admit her to CIR.  Danne Baxter, RN, MSN Rehab Admissions Coordinator [phone] 02/08/2021 1:05 PM

## 2021-02-08 NOTE — Progress Notes (Signed)
PROGRESS NOTE   [name]  ZOX:[id number] DOB: 06-Jul-1940 DOA: 02/02/2021 PCP: Janith Lima, MD  Brief Narrative:  80 year old home dwelling white female-supposed to ambulate with walker known history of Alzheimer's dementia DM TY 2 HTN CKD 3B-- initial fall 9/13-came to ED-CT showed (scalp hematoma CT spine   [-] patient was started on cefdinir for suspected urinary tract infection Return 9/14 2/2 repeat fall hit back of head in addition Repeat CT head = 8 mm right parafalcine subdural hematoma left scalp hematoma 12 mm without fracture multilevel severe degenerative changes noted on CT neck Also treated for Klebsiella  urinary infection this admission  Hospital-Problem based course  Recurrent fall with 8 mm right parafalcine subdural hematoma, 12 mm left scalp hematoma without fracture Neurosurgery rec holding aspirin for 1 week resume on 02/08/2021] Rpt scan 9/15 not concerning for significant worsening and neurosurgery have signed off UTI from prior to admission Urine cultures 9/13 growing >100,000 g Klebsiella Ceftriaxone 9/14--Keflex 9/16 tid completed  9/18 Elevated transaminases-appreciate gastroenterology input No prior blood transfusion noted per daughter-ultrasound RUQ shows a 8 mm GB polyp No abdominal pain or nausea vomiting Differential diagnosis with hep C positive RNA is either hep C or drug-induced from antibiotics Keflex--further work-up alk phos per GI as outpatient If continues to trend down can DC to CIR in a.m.  Hypertension with possible orthostatic hypotension Orthostatics unable to be accomplished hold HCTZ 25, Avapro now continue propanolol 20 bid, add amlodipine 5 Mild azotemia Likely 2/2 poor po.  Saline lock IV 9/20 Moderately controlled DM TY 2 Metformin 500 bid held--only eating 50% meals  Am sugars reasonable Moderate to severe dementia Continue Namzaric (memantine/donepezil) 1 capsule at bedtime, continue Celexa 10 qd Orientable    DVT  prophylaxis: SCD Code Status: DNR Family Communication: discussed at bedside on 9/20  daughter #, [name] [phone]  Disposition:  Status is: Observation  The patient will require care spanning > 2 midnights and should be moved to inpatient because: Hemodynamically unstable and Unsafe d/c plan  Dispo: The patient is from: Home              Anticipated d/c is to: CIR potentially on 9/21 on 9/22 depending on LFT findings etc.              Patient currently is medically stable to d/c.   Difficult to place patient No  Consultants:  Neurosurgery was telephone consulted on 9/14 by the ED  Procedures:   Antimicrobials:   ceftriaxone  Subjective: Awake slightly more coherent but still confused overall Pleasant No pain no fever he did have breakfast this morning without any issue  Objective: Vitals:   02/08/21 0348 02/08/21 0757 02/08/21 1119 02/08/21 1501  BP: (!) 130/52 (!) 174/77 (!) 165/56 (!) 171/66  Pulse: (!) 59 60 60 60  Resp: '19 14 18 18  ' Temp: 97.6 F (36.4 C) 98.1 F (36.7 C) 98.3 F (36.8 C) 98.2 F (36.8 C)  TempSrc: Axillary Oral Oral Oral  SpO2: 98% 97% 98% 99%    Intake/Output Summary (Last 24 hours) at 02/08/2021 1503 Last data filed at 02/08/2021 1300 Gross per 24 hour  Intake 690 ml  Output --  Net 690 ml    There were no vitals filed for this visit.  Examination:  Pleasant cachectic frail white female no distress S1-S2 no murmur Chest is clinically clear no added sound no rales rhonchi Abdomen soft no rebound no guarding ROM intact but does not follow commands consecutively  each time   Data Reviewed: personally reviewed   CBC    Component Value Date/Time   WBC 9.9 02/08/2021 0016   RBC 3.37 (L) 02/08/2021 0016   HGB 9.7 (L) 02/08/2021 0016   HCT 29.1 (L) 02/08/2021 0016   PLT 416 (H) 02/08/2021 0016   MCV 86.4 02/08/2021 0016   MCH 28.8 02/08/2021 0016   MCHC 33.3 02/08/2021 0016   RDW 16.3 (H) 02/08/2021 0016   LYMPHSABS 2.6  02/08/2021 0016   MONOABS 0.9 02/08/2021 0016   EOSABS 0.4 02/08/2021 0016   BASOSABS 0.1 02/08/2021 0016   CMP Latest Ref Rng & Units 02/08/2021 02/07/2021 02/06/2021  Glucose 70 - 99 mg/dL 101(H) 120(H) 133(H)  BUN 8 - 23 mg/dL 21 27(H) 27(H)  Creatinine 0.44 - 1.00 mg/dL 0.91 1.14(H) 1.08(H)  Sodium 135 - 145 mmol/L 135 135 135  Potassium 3.5 - 5.1 mmol/L 3.6 3.8 3.6  Chloride 98 - 111 mmol/L 105 102 103  CO2 22 - 32 mmol/L '22 22 24  ' Calcium 8.9 - 10.3 mg/dL 8.6(L) 8.4(L) 8.7(L)  Total Protein 6.5 - 8.1 g/dL 6.2(L) 6.0(L) -  Total Bilirubin 0.3 - 1.2 mg/dL 1.0 1.2 -  Alkaline Phos 38 - 126 U/L 355(H) 386(H) -  AST 15 - 41 U/L 183(H) 364(H) -  ALT 0 - 44 U/L 175(H) 219(H) -     Radiology Studies: US Abdomen Limited RUQ (LIVER/GB)  Result Date: 02/07/2021 CLINICAL DATA:  Elevated liver function tests. EXAM: ULTRASOUND ABDOMEN LIMITED RIGHT UPPER QUADRANT COMPARISON:  None. FINDINGS: Gallbladder: No gallbladder wall thickening or shadowing gallstone. Small echogenic focus along the posterior wall noted on image 28 could be a sessile polyp or some sludge. The significance is doubtful. Common bile duct: Diameter: 1.5 mm.  Normal. Liver: No focal lesion identified. Within normal limits in parenchymal echogenicity. Portal vein is patent on color Doppler imaging with normal direction of blood flow towards the liver. Other: None. IMPRESSION: Normal study with exception of an 8 mm echogenic focus along the posterior wall of the gallbladder image 28. This could be some sludge or a sessile polyp. The significance is doubtful. Electronically Signed   By: Nelson Chimes M.D.   On: 02/07/2021 09:54     Scheduled Meds:  citalopram  10 mg Oral Daily   memantine  28 mg Oral QHS   And   donepezil  10 mg Oral QHS   mupirocin ointment   Nasal BID   propranolol  20 mg Oral BID   Continuous Infusions:  sodium chloride 50 mL/hr at 02/06/21 0953      LOS: 5 days   Time spent: [place], MD [facility] To contact the attending provider between 7A-7P or the covering provider during after hours 7P-7A, please log into the web site [email or web address] and access using universal [place] password for that web site. If you do not have the password, please call the hospital operator.  02/08/2021, 3:03 PM

## 2021-02-08 NOTE — Consult Note (Addendum)
Consultation  Referring Provider:    Dr. Verlon Au Primary Care Physician:  Janith Lima, MD Primary Gastroenterologist: Previously Dr. Olevia Perches       Reason for Consultation:    Elevated LFTs         HPI:   [name] is a 80 y.o. female with a past medical history of Alzheimer's dementia, type 2 diabetes, hypertension, CKD stage III, who presented to the ED initially on 02/02/2021 after recurrent fall at home.    When patient came in patient was unable to provide history due to her dementia, history was garnered from daughter at bedside and chart review.  Apparently she had come in the day before after a fall and was found to have an abrasion on the back of her head not requiring no repair, CT showed a small left posterior scalp hematoma without acute intracranial abnormality, CT spine showed postsurgical degenerative changes without acute abnormality, started on oral cefdinir for suspected UTI.  She returned to the hospital on the 14th due to repeat fall.  At that time daughter described the patient had notable increased confusion over the last week and a half.  Also accelerated decline in her cognitive abilities.    Hospital course: Neurosurgery recommended holding aspirin for a week, resume 02/08/2021, repeat CT 9/15 was not concerning for significant worsening and neurosurgery signed off.  Patient has had a UTI which has been treated with Keflex.  Noted to have elevated transaminases thought possibly secondary to either ceftriaxone or Keflex which had been discontinued.  Ultrasound of the right upper quadrant showed an 8 mm gallbladder polyp.    We are consulted now in regards to LFTs which have been increasing since hospitalization.    Today, patient was found with her daughter by her side who does assist with history.  She tells me that she is not aware that her mother has ever been told that she had elevated liver enzymes.  Does ask about the positive hep C antibody.  Tells me patient may  have had blood transfusions when she gave birth to all 3 of her children because they were "blue babies", and that was "handled differently back then".  Also explains that she has had 2 back surgeries and she is unsure if she had blood transfusions then.  Denies any other known family history of liver disease.    Denies fever, chills, abdominal pain, nausea, vomiting, jaundice, previous transaminase elevation or symptoms that awaken her from sleep.  ED course: BP 198/84, pulse 93, CT head without contrast showed an 8 mm right parafalcine subdural hematoma hemorrhage extending inferiorly along the right tentorium, CT spine was negative.  Hospital course: Hep C antibody positive (hep C antibody reflex to quantitative pending), iron studies with an iron decreased at 33, percent saturation 10, normal TIBC, ferritin low normal at 39  GI history: 01/21/2008 colonoscopy with Dr. 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, diverticulosis from the descending colon to the sigmoid colon, repeat recommended in 10 years   Past Medical History:  Diagnosis Date   Contact dermatitis and other eczema, due to unspecified cause    Displacement of lumbar intervertebral disc without myelopathy    FH: cataracts    Gout, unspecified    Memory loss    Occlusion and stenosis of carotid artery without mention of cerebral infarction    Other and unspecified hyperlipidemia    Pain in joint, shoulder region    Pain  in limb    Routine general medical examination at a health care facility    Sciatica    Type II or unspecified type diabetes mellitus without mention of complication, uncontrolled    Unspecified essential hypertension    Unspecified venous (peripheral) insufficiency     Past Surgical History:  Procedure Laterality Date   ANTERIOR CERVICAL DECOMP/DISCECTOMY FUSION N/A 12/22/2014   Procedure: Anterior Cervical Four-Five/Five-Six/Six-Seven Decompression/Diskectomy/ and Fsion;  Surgeon:  Leeroy Cha, MD;  Location: [facility];  Service: Neurosurgery;  Laterality: N/A;  C4-5 C5-6 C6-7 Anterior cervical decompression/diskectomy/fusion   LAPAROSCOPIC APPENDECTOMY     LUMBAR LAMINECTOMY  07/2008   [place]   TUBAL LIGATION      Family History  Problem Relation Age of Onset   Alzheimer's disease Mother    Diabetes Mother    Coronary artery disease Father        CABG   Hypertension Father    Cancer Brother    Cancer Brother    Colon cancer Neg Hx    Breast cancer Neg Hx     Social History   Tobacco Use   Smoking status: Former    Types: Cigarettes    Quit date: 05/23/1995    Years since quitting: 25.7   Smokeless tobacco: Never  Substance Use Topics   Alcohol use: No   Drug use: No    Prior to Admission medications   Medication Sig Start Date End Date Taking? Authorizing Provider  aspirin 81 MG EC tablet Take 1 tablet (81 mg total) by mouth daily. Swallow whole. 04/21/15  Yes Janith Lima, MD  cefdinir (OMNICEF) 300 MG capsule Take 1 capsule (300 mg total) by mouth 2 (two) times daily. Patient taking differently: Take 300 mg by mouth 2 (two) times daily. Start date: 02/02/21 02/01/21  Yes Hayden Rasmussen, MD  citalopram (CELEXA) 20 MG tablet Take 1 tablet (20 mg total) by mouth daily. Patient taking differently: Take 10 mg by mouth daily. 06/29/20  Yes Suzzanne Cloud, NP  hydrochlorothiazide (HYDRODIURIL) 25 MG tablet TAKE 1 TABLET(25 MG) BY MOUTH DAILY Patient taking differently: Take 25 mg by mouth in the morning. 09/01/20  Yes Janith Lima, MD  irbesartan (AVAPRO) 150 MG tablet Take 150 mg by mouth daily.   Yes [provider]  levocetirizine (XYZAL) 5 MG tablet TAKE 1 TABLET(5 MG) BY MOUTH EVERY EVENING Patient taking differently: Take 5 mg by mouth every evening. 08/27/20  Yes Janith Lima, MD  metFORMIN (GLUCOPHAGE) 500 MG tablet TAKE 1 TABLET(500 MG) BY MOUTH TWICE DAILY WITH A MEAL Patient taking differently: Take 500 mg by mouth 2 (two)  times daily with a meal. 10/03/20  Yes Janith Lima, MD  propranolol (INDERAL) 20 MG tablet TAKE 1 TABLET(20 MG) BY MOUTH TWICE DAILY Patient taking differently: Take 20 mg by mouth 2 (two) times daily. 11/19/20  Yes Janith Lima, MD  simvastatin (ZOCOR) 20 MG tablet TAKE 1 TABLET(20 MG) BY MOUTH DAILY AT 6 PM Patient taking differently: Take 20 mg by mouth every evening. 09/27/20  Yes Janith Lima, MD  Memantine HCl-Donepezil HCl [facility]) 28-10 MG CP24 Take 1 capsule at bedtime Patient not taking: No sig reported 01/21/21   Kathrynn Ducking, MD    Current Facility-Administered Medications  Medication Dose Route Frequency Provider Last Rate Last Admin   0.9 %  sodium chloride infusion   Intravenous Continuous Nita Sells, MD 50 mL/hr at 02/06/21 0953 New Bag at 02/06/21  4585   citalopram (CELEXA) tablet 10 mg  10 mg Oral Daily Lenore Cordia, MD   10 mg at 02/08/21 0910   memantine (NAMENDA XR) 24 hr capsule 28 mg  28 mg Oral QHS Lenore Cordia, MD   28 mg at 02/07/21 2106   And   donepezil (ARICEPT) tablet 10 mg  10 mg Oral QHS Lenore Cordia, MD   10 mg at 02/07/21 2106   mupirocin ointment (BACTROBAN) 2 %   Nasal BID Nita Sells, MD   Given at 02/08/21 0910   ondansetron (ZOFRAN) tablet 4 mg  4 mg Oral Q6H PRN Lenore Cordia, MD       Or   ondansetron [facility]) injection 4 mg  4 mg Intravenous Q6H PRN Lenore Cordia, MD       propranolol (INDERAL) tablet 20 mg  20 mg Oral BID Lenore Cordia, MD   20 mg at 02/08/21 9292    Allergies as of 02/02/2021 - Review Complete 02/02/2021  Allergen Reaction Noted   Pneumococcal vaccines Swelling 04/21/2015   Wound dressing adhesive Other (See Comments) 12/16/2020   Ampicillin Hives    Neomycin-bacitracin zn-polymyx Rash    Penicillins Hives      Review of Systems:    Constitutional: No weight loss, fever or chills Skin: No rash  Cardiovascular: No chest pain Respiratory: No SOB Gastrointestinal: See HPI and  otherwise negative Genitourinary: No dysuria  Neurological: No headache, dizziness or syncope Musculoskeletal: No new muscle or joint pain Hematologic: No bleeding  Psychiatric: No history of depression or anxiety    Physical Exam:  Vital signs in last 24 hours: Temp:  [97.6 F (36.4 C)-98.8 F (37.1 C)] 98.1 F (36.7 C) (09/20 0757) Pulse Rate:  [59-63] 60 (09/20 0757) Resp:  [14-20] 14 (09/20 0757) BP: (130-174)/(52-77) 174/77 (09/20 0757) SpO2:  [94 %-100 %] 97 % (09/20 0757) Last BM Date: 02/01/21 General:   Pleasantly demented Elderly Caucasian female appears to be in NAD, Well developed, Well nourished, alert and cooperative Head:  Normocephalic and atraumatic. Eyes:   PEERL, EOMI. No icterus. Conjunctiva pink. Ears:  Normal auditory acuity. Neck:  Supple Throat: Oral cavity and pharynx without inflammation, swelling or lesion.  Lungs: Respirations even and unlabored. Lungs clear to auscultation bilaterally.   No wheezes, crackles, or rhonchi.  Heart: Normal S1, S2. No MRG. Regular rate and rhythm. No peripheral edema, cyanosis or pallor.  Abdomen:  Soft, nondistended, nontender. No rebound or guarding. Normal bowel sounds. No appreciable masses or hepatomegaly. Rectal:  Not performed.  Msk:  Symmetrical without gross deformities. Peripheral pulses intact.  Extremities:  Without edema, no deformity or joint abnormality.  Neurologic:  Alert and  oriented x2 Skin:   Dry and intact without significant lesions or rashes. Psychiatric: Impaired memory, inapropriate questioning   LAB RESULTS: Recent Labs    02/08/21 0016  WBC 9.9  HGB 9.7*  HCT 29.1*  PLT 416*   BMET Recent Labs    02/06/21 0255 02/07/21 0248 02/08/21 0016  NA 135 135 135  K 3.6 3.8 3.6  CL 103 102 105  CO2 _0 GLUCOSE 133* 120* 101*  BUN 27* 27* 21  CREATININE 1.08* 1.14* 0.91  CALCIUM 8.7* 8.4* 8.6*   Hepatic Function Latest Ref Rng & Units 02/08/2021 02/07/2021 02/04/2021  Total  Protein 6.5 - 8.1 g/dL 6.2(L) 6.0(L) 6.1(L)  Albumin 3.5 - 5.0 g/dL 2.6(L) 2.4(L) 2.6(L)  AST 15 - 41 U/L 183(H)  364(H) 26  ALT 0 - 44 U/L 175(H) 219(H) 30  Alk Phosphatase 38 - 126 U/L 355(H) 386(H) 166(H)  Total Bilirubin 0.3 - 1.2 mg/dL 1.0 1.2 1.1  Bilirubin, Direct 0.0 - 0.3 mg/dL - - -      STUDIES: US Abdomen Limited RUQ (LIVER/GB)  Result Date: 02/07/2021 CLINICAL DATA:  Elevated liver function tests. EXAM: ULTRASOUND ABDOMEN LIMITED RIGHT UPPER QUADRANT COMPARISON:  None. FINDINGS: Gallbladder: No gallbladder wall thickening or shadowing gallstone. Small echogenic focus along the posterior wall noted on image 28 could be a sessile polyp or some sludge. The significance is doubtful. Common bile duct: Diameter: 1.5 mm.  Normal. Liver: No focal lesion identified. Within normal limits in parenchymal echogenicity. Portal vein is patent on color Doppler imaging with normal direction of blood flow towards the liver. Other: None. IMPRESSION: Normal study with exception of an 8 mm echogenic focus along the posterior wall of the gallbladder image 28. This could be some sludge or a sessile polyp. The significance is doubtful. Electronically Signed   By: Nelson Chimes M.D.   On: 02/07/2021 09:54      Impression / Plan:   Impression: 1.  Elevated transaminases: See trend and labs above, these were normal on 9/16--> 9/19 AST 364, ALT 219, alk phos 386--> 9/20 AST 183, ALT 175, alk phos 355, patient was given 2 doses of ceftriaxone, 1 on 9/14 and another on 915, this is then switched to Keflex on 916 and she has since finished her course, hep C antibody positive, ultrasound with gallbladder polyp versus sludge but patient with no symptoms, LFTs are actually trending downward today, alk phos appears to have been elevated since March 2021 (labs before this 09/26/2017 normal); consider most likely DILI versus gallbladder etiology versus hep C 2.  Subdural hematoma: Recurrent fall with 8 mm right parafalcine  subdural hematoma, neurology have signed off 3.  UTI: Treated with Keflex started on 9/14, finished 9/17 4.  Moderate to severe dementia  Plan: 1.  Agree with further testing for HCV  2.  Patient has no abdominal pain on exam or symptoms of cholelithiasis/gallbladder dysfunction, I do not feel that HIDA scan would be very revealing 3.  With liver enzymes trending down today, likely these can just be followed, if they were to increase again or patient develops symptoms then further evaluation would be necessary 4.  Could consider further differentiation of alk phos given persistent elevation in what looks like the past year  Thank you for your kind consultation, we will continue to follow.  Lavone Nian Lemmon  02/08/2021, 10:40 AM      GI Attending   I have taken an interval history, reviewed the chart and examined the patient. I agree with the Advanced Practitioner's note, impression and recommendations.  Majority the medical decision-making in the formulation of the assessment and plan were performed by me.   She has abnormal transaminases of unclear etiology, they are improving.  She is hepatitis C antibody positive question old infection versus active infection further testing pending.  I am suspicious this could be related to soft tissue injury related to fall,?.  Medication related transaminase elevation also possible.  As Ms. Koren Bound has pointed out enzymes are trending down and we would not embark on much more of a work-up than is done at this point though I did check a total CK.  Once we know the hep C results, then we can consider additional work-up as it has been pointed out  that her alk phos has been elevated for some time and she has had some minor transaminase elevations in the past as well.  Fortunately ultrasound does not reveal any significant liver disease pathology.  Gatha Mayer, MD, [facility] 02/08/2021 4:46 PM

## 2021-02-08 NOTE — Progress Notes (Signed)
Occupational Therapy Treatment Patient Details Name: [name] MRN: [id number] DOB: [birth year] Today's Date: 02/08/2021   History of present illness Pt is a 80yo female presenting to [facility] ED on 9/14 after a fall where she hit her head. CT revealed subdural hematoma on the R. She came to [facility] ED on 9/13 after a different fall and was discharged. PMH: HTN, DM, dementia, history of falling, cervical spine fusion C4-C7.   OT comments  Pt is making slow progress toward being steadier on her feet during adls and adl transfers.  Pt is most limited by decreased cognition.  Pt has history of cognitive impairments but physically is not close to baseline.  Feel rehab may be best option at this point.    Recommendations for follow up therapy are one component of a multi-disciplinary discharge planning process, led by the attending physician.  Recommendations may be updated based on patient status, additional functional criteria and insurance authorization.    Follow Up Recommendations  CIR    Equipment Recommendations  3 in 1 bedside commode;Tub/shower bench    Recommendations for Other Services      Precautions / Restrictions Precautions Precautions: Fall Precaution Comments: Multiple recent falls. Restrictions Weight Bearing Restrictions: No       Mobility Bed Mobility Overal bed mobility: Needs Assistance Bed Mobility: Supine to Sit     Supine to sit: Min assist;HOB elevated     General bed mobility comments: min assist to get to edge of bed.    Transfers Overall transfer level: Needs assistance Equipment used: Rolling walker (2 wheeled) Transfers: Sit to/from Stand Sit to Stand: Min assist         General transfer comment: min assist to power up.  Pt with great difficulty pushing up from the bed instead of pulling on the walker.    Balance Overall balance assessment: Needs assistance;History of Falls Sitting-balance support: Feet supported;Single extremity  supported Sitting balance-Leahy Scale: Fair     Standing balance support: During functional activity;Single extremity supported Standing balance-Leahy Scale: Poor Standing balance comment: Pt reliant on walker or another person in standing.                           ADL either performed or assessed with clinical judgement   ADL Overall ADL's : Needs assistance/impaired     Grooming: Wash/dry hands;Min guard;Standing Grooming Details (indicate cue type and reason): stood at sink for 3 minutes. Required cues for where to turn on water, where to get soap and where to get paper towels even when cued to attempt to find these things.                 Toilet Transfer: Minimal assistance;Ambulation;Regular Toilet;Grab bars;RW Armed forces technical officer Details (indicate cue type and reason): Pt walked to bathroom with walker and min assist. Pt could not find toilet in the bathroom without max cues.  Pt was wearning glasses. Toileting- Clothing Manipulation and Hygiene: Moderate assistance;Sitting/lateral lean;Sit to/from stand Toileting - Clothing Manipulation Details (indicate cue type and reason): Pt unable to figure out what to do with toilet paper when placed in her hand.  Family stated this has become increasingly difficult at home.     Functional mobility during ADLs: Minimal assistance General ADL Comments: Pt with sigificant cognitive deficits that affect ability to be safe with adls.     Vision   Vision Assessment?: Vision impaired- to be further tested in functional context Additional Comments: unsure  about pt's vision and will look closer on next visit. Pt could not locate toilet in the bathroom or some items on sink but it could be because they do not look like her items at home and with dementia, pt could not find them.   Perception     Praxis      Cognition Arousal/Alertness: Awake/alert Behavior During Therapy: Flat affect Overall Cognitive Status: History of  cognitive impairments - at baseline                                 General Comments: Pt with history of dementia.  Oriented person only.  Not sure why at hospital.  Has difficulty following directional commands.        Exercises     Shoulder Instructions       General Comments Pt most limited by cognition at this point but is off of her baseline functionally so feel rehab may help her reach baseline.    Pertinent Vitals/ Pain       Pain Assessment: Faces Faces Pain Scale: Hurts little more Pain Location: lower L leg Pain Descriptors / Indicators: Aching;Discomfort Pain Intervention(s): Limited activity within patient's tolerance;Monitored during session;Repositioned  Home Living                                          Prior Functioning/Environment              Frequency  Min 2X/week        Progress Toward Goals  OT Goals(current goals can now be found in the care plan section)  Progress towards OT goals: Progressing toward goals  Acute Rehab OT Goals Patient Stated Goal: go home OT Goal Formulation: Patient unable to participate in goal setting Time For Goal Achievement: 02/17/21 ADL Goals Pt Will Perform Grooming: with supervision;standing Pt Will Perform Upper Body Bathing: with set-up;standing;sitting Pt Will Perform Upper Body Dressing: with supervision;sitting;standing Pt Will Transfer to Toilet: with supervision;ambulating;regular height toilet Pt Will Perform Toileting - Clothing Manipulation and hygiene: Independently;sitting/lateral leans  Plan Discharge plan remains appropriate    Co-evaluation                 AM-PAC OT "6 Clicks" Daily Activity     Outcome Measure   Help from another person eating meals?: A Little Help from another person taking care of personal grooming?: A Little Help from another person toileting, which includes using toliet, bedpan, or urinal?: A Little Help from another person  bathing (including washing, rinsing, drying)?: A Lot Help from another person to put on and taking off regular upper body clothing?: A Little Help from another person to put on and taking off regular lower body clothing?: A Lot 6 Click Score: 16    End of Session Equipment Utilized During Treatment: Rolling walker  OT Visit Diagnosis: Unsteadiness on feet (R26.81);Muscle weakness (generalized) (M62.81);Pain   Activity Tolerance Patient tolerated treatment well   Patient Left in chair;with call bell/phone within reach;with chair alarm set   Nurse Communication Mobility status        Time: 1009-1057 OT Time Calculation (min): 48 min  Charges: OT General Charges $OT Visit: 1 Visit OT Treatments $Self Care/Home Management : 38-52 mins   Glenford Peers 02/08/2021, 11:52 AM

## 2021-02-09 DIAGNOSIS — S065X0A Traumatic subdural hemorrhage without loss of consciousness, initial encounter: Principal | ICD-10-CM

## 2021-02-09 DIAGNOSIS — R768 Other specified abnormal immunological findings in serum: Secondary | ICD-10-CM | POA: Diagnosis not present

## 2021-02-09 DIAGNOSIS — R748 Abnormal levels of other serum enzymes: Secondary | ICD-10-CM | POA: Diagnosis not present

## 2021-02-09 LAB — CBC WITH DIFFERENTIAL/PLATELET
Abs Immature Granulocytes: 0.06 10*3/uL (ref 0.00–0.07)
Basophils Absolute: 0.1 10*3/uL (ref 0.0–0.1)
Basophils Relative: 0 %
Eosinophils Absolute: 0.5 10*3/uL (ref 0.0–0.5)
Eosinophils Relative: 4 %
HCT: 33.1 % — ABNORMAL LOW (ref 36.0–46.0)
Hemoglobin: 10.8 g/dL — ABNORMAL LOW (ref 12.0–15.0)
Immature Granulocytes: 1 %
Lymphocytes Relative: 17 %
Lymphs Abs: 2.2 10*3/uL (ref 0.7–4.0)
MCH: 28.3 pg (ref 26.0–34.0)
MCHC: 32.6 g/dL (ref 30.0–36.0)
MCV: 86.6 fL (ref 80.0–100.0)
Monocytes Absolute: 1.2 10*3/uL — ABNORMAL HIGH (ref 0.1–1.0)
Monocytes Relative: 9 %
Neutro Abs: 9.3 10*3/uL — ABNORMAL HIGH (ref 1.7–7.7)
Neutrophils Relative %: 69 %
Platelets: 478 10*3/uL — ABNORMAL HIGH (ref 150–400)
RBC: 3.82 MIL/uL — ABNORMAL LOW (ref 3.87–5.11)
RDW: 16 % — ABNORMAL HIGH (ref 11.5–15.5)
WBC: 13.3 10*3/uL — ABNORMAL HIGH (ref 4.0–10.5)
nRBC: 0 % (ref 0.0–0.2)

## 2021-02-09 LAB — COMPREHENSIVE METABOLIC PANEL
ALT: 156 U/L — ABNORMAL HIGH (ref 0–44)
AST: 127 U/L — ABNORMAL HIGH (ref 15–41)
Albumin: 2.8 g/dL — ABNORMAL LOW (ref 3.5–5.0)
Alkaline Phosphatase: 447 U/L — ABNORMAL HIGH (ref 38–126)
Anion gap: 10 (ref 5–15)
BUN: 21 mg/dL (ref 8–23)
CO2: 24 mmol/L (ref 22–32)
Calcium: 8.9 mg/dL (ref 8.9–10.3)
Chloride: 101 mmol/L (ref 98–111)
Creatinine, Ser: 1.03 mg/dL — ABNORMAL HIGH (ref 0.44–1.00)
GFR, Estimated: 55 mL/min — ABNORMAL LOW (ref >60)
Glucose, Bld: 138 mg/dL — ABNORMAL HIGH (ref 70–99)
Potassium: 3.5 mmol/L (ref 3.5–5.1)
Sodium: 135 mmol/L (ref 135–145)
Total Bilirubin: 1 mg/dL (ref 0.3–1.2)
Total Protein: 6.9 g/dL (ref 6.5–8.1)

## 2021-02-09 NOTE — Progress Notes (Signed)
Physical Therapy Treatment Patient Details Name: [name] MRN: [id number] DOB: 03/10/1941 Today's Date: 02/09/2021   History of Present Illness Pt is a 80yo female presenting to [facility] ED on 9/14 after a fall where she hit her head. CT revealed subdural hematoma on the R. She came to [facility] ED on 9/13 after a different fall and was discharged. PMH: HTN, DM, dementia, history of falling, cervical spine fusion C4-C7.    PT Comments    Patient progressing with gait distance and able to utilize RW today with cues for proximity and for turns.  She continues to exhibit high fall risk with shuffling feet and poor safety awareness.  Continue to recommend CIR for rehab prior to d/c home.     Recommendations for follow up therapy are one component of a multi-disciplinary discharge planning process, led by the attending physician.  Recommendations may be updated based on patient status, additional functional criteria and insurance authorization.  Follow Up Recommendations  CIR     Equipment Recommendations  3in1 (PT)    Recommendations for Other Services       Precautions / Restrictions Precautions Precautions: Fall Precaution Comments: Multiple recent falls.     Mobility  Bed Mobility               General bed mobility comments: in recliner    Transfers Overall transfer level: Needs assistance Equipment used: Rolling walker (2 wheeled) Transfers: Sit to/from Stand Sit to Stand: Mod assist         General transfer comment: lifting help to stand from recliner, pt placing hands on walker  Ambulation/Gait Ambulation/Gait assistance: Min assist Gait Distance (Feet): 100 Feet Assistive device: Rolling walker (2 wheeled) Gait Pattern/deviations: Step-to pattern;Decreased stride length;Shuffle     General Gait Details: cues for proximity to walker, assist and increased time for turning walker, pt with short shuffling steps   Stairs             Wheelchair Mobility     Modified Rankin (Stroke Patients Only)       Balance Overall balance assessment: Needs assistance   Sitting balance-Leahy Scale: Fair     Standing balance support: Single extremity supported;Bilateral upper extremity supported;During functional activity Standing balance-Leahy Scale: Poor Standing balance comment: standing looking at coloring page on the wall pointing out colors with 1 UE support working on upright posture, walker proximity, etc                            Cognition Arousal/Alertness: Awake/alert Behavior During Therapy: Flat affect Overall Cognitive Status: History of cognitive impairments - at baseline                                        Exercises      General Comments        Pertinent Vitals/Pain Pain Assessment: Faces Faces Pain Scale: No hurt    Home Living                      Prior Function            PT Goals (current goals can now be found in the care plan section) Progress towards PT goals: Progressing toward goals    Frequency    Min 3X/week      PT Plan Current plan remains appropriate  Co-evaluation              AM-PAC PT "6 Clicks" Mobility   Outcome Measure  Help needed turning from your back to your side while in a flat bed without using bedrails?: A Little Help needed moving from lying on your back to sitting on the side of a flat bed without using bedrails?: A Lot Help needed moving to and from a bed to a chair (including a wheelchair)?: A Little Help needed standing up from a chair using your arms (e.g., wheelchair or bedside chair)?: A Lot Help needed to walk in hospital room?: A Little Help needed climbing 3-5 steps with a railing? : A Lot 6 Click Score: 15    End of Session Equipment Utilized During Treatment: Gait belt Activity Tolerance: Patient tolerated treatment well Patient left: in chair;with call bell/phone within reach;with chair alarm set   PT Visit  Diagnosis: History of falling (Z91.81);Pain;Other abnormalities of gait and mobility (R26.89);Muscle weakness (generalized) (M62.81)     Time: 8592-9244 PT Time Calculation (min) (ACUTE ONLY): 24 min  Charges:  $Gait Training: 8-22 mins $Neuromuscular Re-education: 8-22 mins                     Magda Kiel, PT Acute Rehabilitation Services [phone] Office:[phone] 02/09/2021     [name] 02/09/2021, 5:33 PM

## 2021-02-09 NOTE — Progress Notes (Signed)
Inpatient Rehabilitation Admissions Coordinator   I notified patient and family at bedside that I have no CIR bed available to admit her today. Hopeful for tomorrow.  Danne Baxter, RN, MSN Rehab Admissions Coordinator [phone] 02/09/2021 1:12 PM

## 2021-02-09 NOTE — Progress Notes (Signed)
   Patient Name: [name] Date of Encounter: 02/09/2021, 12:33 PM    Subjective  No new events   Objective  BP (!) 181/64 (BP Location: Left Arm)   Pulse 64   Temp 98.3 F (36.8 C) (Oral)   Resp 16   SpO2 96%  Resting in chair NAD  Recent Labs  Lab 02/02/21 2130 02/04/21 0131 02/07/21 0248 02/08/21 0016 02/09/21 0340  AST 36 26 364* 183* 127*  ALT 43 30 219* 175* 156*  ALKPHOS 228* 166* 386* 355* 447*  BILITOT 0.9 1.1 1.2 1.0 1.0  PROT 6.4* 6.1* 6.0* 6.2* 6.9  ALBUMIN 2.9* 2.6* 2.4* 2.6* 2.8*  INR 1.1  --   --   --   --     Lab Results  Component Value Date   CKTOTAL 44 02/08/2021   HCV RNA pending  [place] Abd ok ? Gallbladder polyp vs sludge  Assessment and Plan  Abnl transaminases and alkaline phosphatise - alk phos up TA's down + HCV Ab  CK ok so that theory not correct  Await HCV tests Will check chart and f/u in person as needed Medication side effects possible also  I don't think you need to do daily LFT  Gatha Mayer, MD, [facility] 02/09/2021 12:33 PM

## 2021-02-09 NOTE — Progress Notes (Signed)
PROGRESS NOTE    [name]  NWG:[id number] DOB: 01/31/1941 DOA: 02/02/2021 PCP: Cynthia Lima, MD   Chief Complaint  Patient presents with   Fall   Brief Narrative:  80 yo with hx alzheimers, T2DM, HTN, CKD IIIb who presented after [name] mechanical fall at home with head CT findings showing [name] subdural hematoma.      She's also been treated for klebsiella UTI.   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)   Chronic renal disease, stage 3, moderately decreased glomerular filtration rate (GFR) between 30-59 mL/min/1.73 square meter (HCC)   Recurrent falls   Laceration of occipital region of scalp   Subdural hematoma (HCC)   Pressure injury of skin   Recurrent fall  8 mm right parafalcine subdural hemorrhage  12 mm left scalp hematoma without fracture - fall initially 9/13, then came back to ED on 9/14 with repeat fall - imaging notable for 8 mm right parafalcine subdural hemorrhage, L occipital scalp 12 mm hematoma without calvarial fx - repeat head CT 9/15 stable - neurosurgery recommended holding aspirin x1 week  - therapy recommending CIR - staples need to come out after 7-14 days  UTI from prior to admission Urine cultures 9/13 growing >100,000 g Klebsiella S/p 5 days abx  Elevated transaminases-appreciate gastroenterology input No abdominal pain or nausea vomiting Hemodynamically mediated? Drug induced. RUQ with 8 mm echogenic focus along posterior wall of gallbladder - doubtful significance Hep C positive, pending PCR GI planning to follow  Hypertension with possible orthostatic hypotension Orthostatics unable to be accomplished - try tomorrow if possible hold HCTZ 25, Avapro now continue propanolol 20 bid, add amlodipine 5  Moderately controlled  DM TY 2 Metformin 500 bid held--only eating 50% meals  Am sugars reasonable  Moderate to severe dementia Continue Namzaric (memantine/donepezil) 1 capsule at bedtime, continue Celexa 10 qd Orientable  DVT prophylaxis: SCD Code Status: DNR Family Communication: daughter at bedside Disposition:   Status is: Inpatient  Remains inpatient appropriate because:Inpatient level of care appropriate due to severity of illness  Dispo: The patient is from: Home              Anticipated d/c is to: Home              Patient currently is not medically stable to d/c.   Difficult to place patient No       Consultants:  GI  Procedures:  Staples x4 9/14 to L scalp  Antimicrobials:  Anti-infectives (From admission, onward)    Start     Dose/Rate Route Frequency Ordered Stop   02/04/21 1400  cephALEXin (KEFLEX) capsule 500 mg  Status:  Discontinued        500 mg Oral Every 8 hours 02/04/21 1130 02/06/21 1609   02/02/21 2230  cefTRIAXone (ROCEPHIN) 1 g in sodium chloride 0.9 % 100 mL IVPB  Status:  Discontinued        1 g 200 mL/hr over 30 Minutes Intravenous Every 24 hours 02/02/21 2158 02/04/21 1130          Subjective: No new complaints  Objective: Vitals:   02/09/21 1244 02/09/21 1308 02/09/21 1644 02/09/21 1916  BP: (!) 166/59 (!) 142/57 (!) 142/66 (!) 154/62  Pulse:  62 68 67  Resp:  19 15 16   Temp:  98.5 F (36.9 C) 98.3 F (36.8 C)  TempSrc:   Oral Oral  SpO2:  97% 98%     Intake/Output Summary (Last 24 hours) at 02/09/2021 2025 Last data filed at 02/09/2021 0900 Gross per 24 hour  Intake 120 ml  Output 250 ml  Net -130 ml   There were no vitals filed for this visit.  Examination:  General exam: Appears calm and comfortable  Respiratory system: unlabored Cardiovascular system: RRR Gastrointestinal system: Abdomen is nondistended, soft and nontender.  Central nervous system: Alert and oriented. No focal neurological deficits. Extremities: no LEE Skin:  staples L posterior scalp, 4 Psychiatry: Judgement and insight appear normal. Mood & affect appropriate.     Data Reviewed: I have personally reviewed following labs and imaging studies  CBC: Recent Labs  Lab 02/02/21 2130 02/03/21 0516 02/04/21 0131 02/08/21 0016 02/09/21 0340  WBC 10.5 11.4* 9.8 9.9 13.3*  NEUTROABS 7.3  --  6.1 6.0 9.3*  HGB 10.1* 10.7* 9.1* 9.7* 10.8*  HCT 31.3* 34.6* 28.3* 29.1* 33.1*  MCV 87.2 89.2 87.6 86.4 86.6  PLT 421* 466* 418* 416* 478*    Basic Metabolic Panel: Recent Labs  Lab 02/05/21 0256 02/06/21 0255 02/07/21 0248 02/08/21 0016 02/09/21 0340  NA 136 135 135 135 135  K 3.7 3.6 3.8 3.6 3.5  CL 103 103 102 105 101  CO2 22 24 22 22 24   GLUCOSE 109* 133* 120* 101* 138*  BUN 22 27* 27* 21 21  CREATININE 1.09* 1.08* 1.14* 0.91 1.03*  CALCIUM 8.9 8.7* 8.4* 8.6* 8.9    GFR: CrCl cannot be calculated (Unknown ideal weight.).  Liver Function Tests: Recent Labs  Lab 02/02/21 2130 02/04/21 0131 02/07/21 0248 02/08/21 0016 02/09/21 0340  AST 36 26 364* 183* 127*  ALT 43 30 219* 175* 156*  ALKPHOS 228* 166* 386* 355* 447*  BILITOT 0.9 1.1 1.2 1.0 1.0  PROT 6.4* 6.1* 6.0* 6.2* 6.9  ALBUMIN 2.9* 2.6* 2.4* 2.6* 2.8*    CBG: No results for input(s): GLUCAP in the last 168 hours.   Recent Results (from the past 240 hour(s))  Urine Culture     Status: Abnormal   Collection Time: 02/01/21  2:10 PM   Specimen: Urine, Clean Catch  Result Value Ref Range Status   Specimen Description URINE, CLEAN CATCH  Final   Special Requests   Final    NONE Performed at [facility], [address], [place], [place] [address]    Culture >=100,000 COLONIES/mL KLEBSIELLA PNEUMONIAE ([name])  Final   Report Status 02/04/2021 FINAL  Final   Organism ID, Bacteria KLEBSIELLA PNEUMONIAE ([name])  Final      Susceptibility   Klebsiella pneumoniae - MIC*    AMPICILLIN RESISTANT Resistant     CEFAZOLIN <=4 SENSITIVE Sensitive     CEFEPIME <=0.12  SENSITIVE Sensitive     CEFTRIAXONE <=0.25 SENSITIVE Sensitive     CIPROFLOXACIN <=0.25 SENSITIVE Sensitive     GENTAMICIN <=1 SENSITIVE Sensitive     IMIPENEM 1 SENSITIVE Sensitive     NITROFURANTOIN 32 SENSITIVE Sensitive     TRIMETH/SULFA <=20 SENSITIVE Sensitive     AMPICILLIN/SULBACTAM 4 SENSITIVE Sensitive     PIP/TAZO <=4 SENSITIVE Sensitive     * >=100,000 COLONIES/mL KLEBSIELLA PNEUMONIAE  Resp Panel by RT-PCR (Flu Seiya Silsby&B, Covid) Nasopharyngeal Swab     Status: None   Collection Time: 02/02/21  7:36 PM   Specimen: Nasopharyngeal Swab; Nasopharyngeal(NP) swabs in vial transport medium  Result Value  Ref Range Status   SARS Coronavirus 2 by RT PCR NEGATIVE NEGATIVE Final    Comment: (NOTE) SARS-CoV-2 target nucleic acids are NOT DETECTED.  The SARS-CoV-2 RNA is generally detectable in upper respiratory specimens during the acute phase of infection. The lowest concentration of SARS-CoV-2 viral copies this assay can detect is 138 copies/mL. Glendale Youngblood negative result does not preclude SARS-Cov-2 infection and should not be used as the sole basis for treatment or other patient management decisions. Sakib Noguez negative result may occur with  improper specimen collection/handling, submission of specimen other than nasopharyngeal swab, presence of viral mutation(s) within the areas targeted by this assay, and inadequate number of viral copies(<138 copies/mL). Jess Toney negative result must be combined with clinical observations, patient history, and epidemiological information. The expected result is Negative.  Fact Sheet for Patients:  EntrepreneurPulse.com.au  Fact Sheet for Healthcare Providers:  IncredibleEmployment.be  This test is no t yet approved or cleared by the [place] FDA and  has been authorized for detection and/or diagnosis of SARS-CoV-2 by FDA under an Emergency Use Authorization (EUA). This EUA will remain  in effect (meaning this test can be used)  for the duration of the COVID-19 declaration under Section 564(b)(1) of the Act, 21 U.S.C.section 360bbb-3(b)(1), unless the authorization is terminated  or revoked sooner.       Influenza Brianna Bennett by PCR NEGATIVE NEGATIVE Final   Influenza B by PCR NEGATIVE NEGATIVE Final    Comment: (NOTE) The Xpert Xpress SARS-CoV-2/FLU/RSV plus assay is intended as an aid in the diagnosis of influenza from Nasopharyngeal swab specimens and should not be used as Maddisen Vought sole basis for treatment. Nasal washings and aspirates are unacceptable for Xpert Xpress SARS-CoV-2/FLU/RSV testing.  Fact Sheet for Patients: EntrepreneurPulse.com.au  Fact Sheet for Healthcare Providers: IncredibleEmployment.be  This test is not yet approved or cleared by the [place] FDA and has been authorized for detection and/or diagnosis of SARS-CoV-2 by FDA under an Emergency Use Authorization (EUA). This EUA will remain in effect (meaning this test can be used) for the duration of the COVID-19 declaration under Section 564(b)(1) of the Act, 21 U.S.C. section 360bbb-3(b)(1), unless the authorization is terminated or revoked.  Performed at [facility], [place] [address] [place], [place] [address]   MRSA Next Gen by PCR, Nasal     Status: Abnormal   Collection Time: 02/03/21  3:24 PM   Specimen: Nasal Mucosa; Nasal Swab  Result Value Ref Range Status   MRSA by PCR Next Gen DETECTED (Lilie Vezina) NOT DETECTED Final    Comment: RESULT CALLED TO, READ BACK BY AND VERIFIED WITH: Alyson Locket RN [id number] 02/03/21 Hubbard Seldon BROWNING (NOTE) The GeneXpert MRSA Assay (FDA approved for NASAL specimens only), is one component of [facility] comprehensive MRSA colonization surveillance program. It is not intended to diagnose MRSA infection nor to guide or monitor treatment for MRSA infections. Test performance is not FDA approved in patients less than 55 years old. Performed at [facility], [place] [address] [place],  [place] [address]          Radiology Studies: No results found.      Scheduled Meds:  amLODipine  5 mg Oral Daily   citalopram  10 mg Oral Daily   memantine  28 mg Oral QHS   And   donepezil  10 mg Oral QHS   mupirocin ointment   Nasal BID   propranolol  20 mg Oral BID   Continuous Infusions:   LOS: 6 days  Time spent: over 30 min    Fayrene Helper, MD [facility]   To contact the attending provider between 7A-7P or the covering provider during after hours 7P-7A, please log into the web site [email or web address] and access using universal [place] password for that web site. If you do not have the password, please call the hospital operator.  02/09/2021, 8:25 PM

## 2021-02-09 NOTE — Progress Notes (Signed)
Speech Language Pathology Treatment: Dysphagia  Patient Details Name: [name] MRN: [id number] DOB: 04-06-1941 Today's Date: 02/09/2021 Time: 7225-7505 SLP Time Calculation (min) (ACUTE ONLY): 19 min  Assessment / Plan / Recommendation Clinical Impression  Pt was seen for skilled observation of diet toleration and upgraded PO trials. Pt awake and alert with daughter at bedside. Daughter reports pt showed no signs of trouble with eating previous meal trays during stay but sometimes has trouble swallowing pills whole with thin liquid. SLP observed pt with Dys2/thin liquid breakfast tray and trialed regular solids. Pt exhibited prolonged mastication across all solid consistencies which daughter reports is present at baseline. Pt showed no overt s/s of penetration or aspiration. SLP recommend regular/thin liquid diet. Pt has met all swallowing goals with no further needs identified for current venue of care. Pt may benefit from cognitive therapy from SLP on CIR depending prior level of cognitive function with dementia.   HPI HPI: Pt is an 80 year old female with recurrent fall with 8 mm right parafalcine subdural hematoma, 12 mm left scalp hematoma without fracture  Multiple falls indicate risk of further issues-neurosurgery is recommending holding aspirin for 1 week  Follow repeat CT scan 9/15 to compare. Pt failed swallow screen.      SLP Plan  All goals met      Recommendations for follow up therapy are one component of a multi-disciplinary discharge planning process, led by the attending physician.  Recommendations may be updated based on patient status, additional functional criteria and insurance authorization.    Recommendations  Diet recommendations: Regular;Thin liquid Liquids provided via: Straw;Cup Medication Administration: Crushed with puree Supervision: Patient able to self feed Compensations: Minimize environmental distractions Postural Changes and/or Swallow Maneuvers:  Seated upright 90 degrees;Upright 30-60 min after meal                Oral Care Recommendations: Oral care BID Follow up Recommendations: Inpatient Rehab SLP Visit Diagnosis: Dysphagia, unspecified (R13.10) Plan: All goals met       GO              Dewitt Rota, SLP-Student   Dewitt Rota  02/09/2021, 12:52 PM

## 2021-02-10 ENCOUNTER — Other Ambulatory Visit: Payer: Self-pay

## 2021-02-10 ENCOUNTER — Inpatient Hospital Stay (HOSPITAL_COMMUNITY)
Admission: RE | Admit: 2021-02-10 | Discharge: 2021-03-01 | DRG: 945 | Disposition: A | Discharge status: Home Health | Payer: Medicare Other | Source: Intra-hospital | Attending: Physical Medicine & Rehabilitation | Admitting: Physical Medicine & Rehabilitation

## 2021-02-10 ENCOUNTER — Encounter (HOSPITAL_COMMUNITY): Payer: Self-pay | Admitting: Physical Medicine & Rehabilitation

## 2021-02-10 DIAGNOSIS — Z820 Family history of epilepsy and other diseases of the nervous system: Secondary | ICD-10-CM

## 2021-02-10 DIAGNOSIS — R296 Repeated falls: Secondary | ICD-10-CM | POA: Diagnosis present

## 2021-02-10 DIAGNOSIS — S065X2D Traumatic subdural hemorrhage with loss of consciousness of 31 minutes to 59 minutes, subsequent encounter: Secondary | ICD-10-CM | POA: Diagnosis not present

## 2021-02-10 DIAGNOSIS — M10071 Idiopathic gout, right ankle and foot: Secondary | ICD-10-CM | POA: Diagnosis not present

## 2021-02-10 DIAGNOSIS — S065X2S Traumatic subdural hemorrhage with loss of consciousness of 31 minutes to 59 minutes, sequela: Secondary | ICD-10-CM | POA: Diagnosis not present

## 2021-02-10 DIAGNOSIS — F0390 Unspecified dementia without behavioral disturbance: Secondary | ICD-10-CM | POA: Diagnosis present

## 2021-02-10 DIAGNOSIS — S065X9D Traumatic subdural hemorrhage with loss of consciousness of unspecified duration, subsequent encounter: Secondary | ICD-10-CM | POA: Diagnosis not present

## 2021-02-10 DIAGNOSIS — Z809 Family history of malignant neoplasm, unspecified: Secondary | ICD-10-CM

## 2021-02-10 DIAGNOSIS — E669 Obesity, unspecified: Secondary | ICD-10-CM

## 2021-02-10 DIAGNOSIS — S065X0S Traumatic subdural hemorrhage without loss of consciousness, sequela: Secondary | ICD-10-CM | POA: Diagnosis not present

## 2021-02-10 DIAGNOSIS — K5901 Slow transit constipation: Secondary | ICD-10-CM | POA: Diagnosis present

## 2021-02-10 DIAGNOSIS — R7401 Elevation of levels of liver transaminase levels: Secondary | ICD-10-CM

## 2021-02-10 DIAGNOSIS — Z981 Arthrodesis status: Secondary | ICD-10-CM | POA: Diagnosis not present

## 2021-02-10 DIAGNOSIS — B961 Klebsiella pneumoniae [K. pneumoniae] as the cause of diseases classified elsewhere: Secondary | ICD-10-CM | POA: Diagnosis present

## 2021-02-10 DIAGNOSIS — Z888 Allergy status to other drugs, medicaments and biological substances status: Secondary | ICD-10-CM | POA: Diagnosis not present

## 2021-02-10 DIAGNOSIS — N390 Urinary tract infection, site not specified: Secondary | ICD-10-CM | POA: Diagnosis present

## 2021-02-10 DIAGNOSIS — N1831 Chronic kidney disease, stage 3a: Secondary | ICD-10-CM | POA: Diagnosis present

## 2021-02-10 DIAGNOSIS — E119 Type 2 diabetes mellitus without complications: Secondary | ICD-10-CM | POA: Diagnosis not present

## 2021-02-10 DIAGNOSIS — R7989 Other specified abnormal findings of blood chemistry: Secondary | ICD-10-CM

## 2021-02-10 DIAGNOSIS — S065X0A Traumatic subdural hemorrhage without loss of consciousness, initial encounter: Secondary | ICD-10-CM

## 2021-02-10 DIAGNOSIS — R4189 Other symptoms and signs involving cognitive functions and awareness: Secondary | ICD-10-CM | POA: Diagnosis present

## 2021-02-10 DIAGNOSIS — Z887 Allergy status to serum and vaccine status: Secondary | ICD-10-CM

## 2021-02-10 DIAGNOSIS — Z8249 Family history of ischemic heart disease and other diseases of the circulatory system: Secondary | ICD-10-CM

## 2021-02-10 DIAGNOSIS — Z881 Allergy status to other antibiotic agents status: Secondary | ICD-10-CM

## 2021-02-10 DIAGNOSIS — R1310 Dysphagia, unspecified: Secondary | ICD-10-CM | POA: Diagnosis present

## 2021-02-10 DIAGNOSIS — M109 Gout, unspecified: Secondary | ICD-10-CM | POA: Diagnosis present

## 2021-02-10 DIAGNOSIS — S065X9A Traumatic subdural hemorrhage with loss of consciousness of unspecified duration, initial encounter: Principal | ICD-10-CM | POA: Diagnosis present

## 2021-02-10 DIAGNOSIS — I129 Hypertensive chronic kidney disease with stage 1 through stage 4 chronic kidney disease, or unspecified chronic kidney disease: Secondary | ICD-10-CM | POA: Diagnosis present

## 2021-02-10 DIAGNOSIS — Z833 Family history of diabetes mellitus: Secondary | ICD-10-CM | POA: Diagnosis not present

## 2021-02-10 DIAGNOSIS — Z880 Allergy status to penicillin: Secondary | ICD-10-CM

## 2021-02-10 DIAGNOSIS — I951 Orthostatic hypotension: Secondary | ICD-10-CM | POA: Diagnosis not present

## 2021-02-10 DIAGNOSIS — E1122 Type 2 diabetes mellitus with diabetic chronic kidney disease: Secondary | ICD-10-CM | POA: Diagnosis present

## 2021-02-10 DIAGNOSIS — Z7982 Long term (current) use of aspirin: Secondary | ICD-10-CM | POA: Diagnosis not present

## 2021-02-10 DIAGNOSIS — R1312 Dysphagia, oropharyngeal phase: Secondary | ICD-10-CM | POA: Diagnosis not present

## 2021-02-10 DIAGNOSIS — I6200 Nontraumatic subdural hemorrhage, unspecified: Secondary | ICD-10-CM | POA: Diagnosis not present

## 2021-02-10 DIAGNOSIS — Z87891 Personal history of nicotine dependence: Secondary | ICD-10-CM

## 2021-02-10 DIAGNOSIS — K719 Toxic liver disease, unspecified: Secondary | ICD-10-CM | POA: Diagnosis not present

## 2021-02-10 DIAGNOSIS — S065XAA Traumatic subdural hemorrhage with loss of consciousness status unknown, initial encounter: Secondary | ICD-10-CM | POA: Diagnosis present

## 2021-02-10 DIAGNOSIS — E785 Hyperlipidemia, unspecified: Secondary | ICD-10-CM | POA: Diagnosis present

## 2021-02-10 DIAGNOSIS — I10 Essential (primary) hypertension: Secondary | ICD-10-CM

## 2021-02-10 DIAGNOSIS — Z7984 Long term (current) use of oral hypoglycemic drugs: Secondary | ICD-10-CM | POA: Diagnosis not present

## 2021-02-10 DIAGNOSIS — Z79899 Other long term (current) drug therapy: Secondary | ICD-10-CM | POA: Diagnosis not present

## 2021-02-10 DIAGNOSIS — E1169 Type 2 diabetes mellitus with other specified complication: Secondary | ICD-10-CM

## 2021-02-10 LAB — COMPREHENSIVE METABOLIC PANEL
ALT: 114 U/L — ABNORMAL HIGH (ref 0–44)
AST: 74 U/L — ABNORMAL HIGH (ref 15–41)
Albumin: 2.8 g/dL — ABNORMAL LOW (ref 3.5–5.0)
Alkaline Phosphatase: 397 U/L — ABNORMAL HIGH (ref 38–126)
Anion gap: 10 (ref 5–15)
BUN: 25 mg/dL — ABNORMAL HIGH (ref 8–23)
CO2: 24 mmol/L (ref 22–32)
Calcium: 9 mg/dL (ref 8.9–10.3)
Chloride: 102 mmol/L (ref 98–111)
Creatinine, Ser: 1.03 mg/dL — ABNORMAL HIGH (ref 0.44–1.00)
GFR, Estimated: 55 mL/min — ABNORMAL LOW (ref >60)
Glucose, Bld: 118 mg/dL — ABNORMAL HIGH (ref 70–99)
Potassium: 3.4 mmol/L — ABNORMAL LOW (ref 3.5–5.1)
Sodium: 136 mmol/L (ref 135–145)
Total Bilirubin: 1.2 mg/dL (ref 0.3–1.2)
Total Protein: 6.7 g/dL (ref 6.5–8.1)

## 2021-02-10 LAB — CBC WITH DIFFERENTIAL/PLATELET
Abs Immature Granulocytes: 0.07 10*3/uL (ref 0.00–0.07)
Basophils Absolute: 0.1 10*3/uL (ref 0.0–0.1)
Basophils Relative: 0 %
Eosinophils Absolute: 0.4 10*3/uL (ref 0.0–0.5)
Eosinophils Relative: 3 %
HCT: 31.3 % — ABNORMAL LOW (ref 36.0–46.0)
Hemoglobin: 10 g/dL — ABNORMAL LOW (ref 12.0–15.0)
Immature Granulocytes: 1 %
Lymphocytes Relative: 20 %
Lymphs Abs: 2.8 10*3/uL (ref 0.7–4.0)
MCH: 27.8 pg (ref 26.0–34.0)
MCHC: 31.9 g/dL (ref 30.0–36.0)
MCV: 86.9 fL (ref 80.0–100.0)
Monocytes Absolute: 1.3 10*3/uL — ABNORMAL HIGH (ref 0.1–1.0)
Monocytes Relative: 9 %
Neutro Abs: 9.5 10*3/uL — ABNORMAL HIGH (ref 1.7–7.7)
Neutrophils Relative %: 67 %
Platelets: 496 10*3/uL — ABNORMAL HIGH (ref 150–400)
RBC: 3.6 MIL/uL — ABNORMAL LOW (ref 3.87–5.11)
RDW: 15.9 % — ABNORMAL HIGH (ref 11.5–15.5)
WBC: 14.1 10*3/uL — ABNORMAL HIGH (ref 4.0–10.5)
nRBC: 0 % (ref 0.0–0.2)

## 2021-02-10 MED ORDER — ONDANSETRON HCL 4 MG PO TABS: 4.0000 mg | ORAL_TABLET | Freq: Four times a day (QID) | ORAL | Status: DC | PRN

## 2021-02-10 MED ORDER — PREDNISONE 20 MG PO TABS
20.0000 mg | ORAL_TABLET | Freq: Four times a day (QID) | ORAL | Status: AC
  Administered 2021-02-10 (×2): 20 mg via ORAL
  Filled 2021-02-10 (×2): qty 1

## 2021-02-10 MED ORDER — ONDANSETRON HCL 4 MG/2ML IJ SOLN: 4.0000 mg | Freq: Four times a day (QID) | INTRAMUSCULAR | Status: DC | PRN

## 2021-02-10 MED ORDER — MEMANTINE HCL ER 28 MG PO CP24
28.0000 mg | ORAL_CAPSULE | Freq: Every day | ORAL | Status: DC
  Administered 2021-02-10 – 2021-02-28 (×19): 28 mg via ORAL
  Filled 2021-02-10 (×20): qty 1

## 2021-02-10 MED ORDER — PROPRANOLOL HCL 20 MG PO TABS
20.0000 mg | ORAL_TABLET | Freq: Two times a day (BID) | ORAL | Status: DC
  Administered 2021-02-10 – 2021-03-01 (×38): 20 mg via ORAL
  Filled 2021-02-10 (×38): qty 1

## 2021-02-10 MED ORDER — AMLODIPINE BESYLATE 5 MG PO TABS: 5.0000 mg | ORAL_TABLET | Freq: Every day | ORAL | 0 refills | Status: DC

## 2021-02-10 MED ORDER — CITALOPRAM HYDROBROMIDE 10 MG PO TABS
10.0000 mg | ORAL_TABLET | Freq: Every day | ORAL | Status: DC
  Administered 2021-02-11 – 2021-03-01 (×19): 10 mg via ORAL
  Filled 2021-02-10 (×19): qty 1

## 2021-02-10 MED ORDER — MUPIROCIN 2 % EX OINT
1.0000 "application " | TOPICAL_OINTMENT | Freq: Two times a day (BID) | CUTANEOUS | Status: DC
  Administered 2021-02-10 – 2021-02-28 (×35): 1 via NASAL
  Filled 2021-02-10: qty 22

## 2021-02-10 MED ORDER — AMLODIPINE BESYLATE 5 MG PO TABS
5.0000 mg | ORAL_TABLET | Freq: Every day | ORAL | Status: DC
  Administered 2021-02-11 – 2021-03-01 (×19): 5 mg via ORAL
  Filled 2021-02-10: qty 2
  Filled 2021-02-10 (×18): qty 1

## 2021-02-10 MED ORDER — DONEPEZIL HCL 10 MG PO TABS
10.0000 mg | ORAL_TABLET | Freq: Every day | ORAL | Status: DC
  Administered 2021-02-10 – 2021-02-28 (×19): 10 mg via ORAL
  Filled 2021-02-10 (×19): qty 1

## 2021-02-10 NOTE — Progress Notes (Signed)
Cynthia Staggers, MD   Physician  Physical Medicine and Rehabilitation  PMR Pre-admission     Signed  Date of Service:  02/05/2021 12:55 PM       Related encounter: ED to Hosp-Admission (Discharged) from 02/02/2021 in [facility]      Show:Clear all '[x]' Written'[x]' Templated'[x]' Copied  Added by: '[x]' Cristina Gong, RN'[x]' Lind Covert, Lauren Mamie Nick, CCC-SLP'[x]' Cynthia Staggers, MD  '[]' Hover for details                                                                                                                                                                                                                                                                                                                                                                                                                                                    PMR Admission Coordinator Pre-Admission Assessment   Patient: [name] is an 80 y.o., female MRN: [id number] DOB: August 07, 1940 Height:   Weight:     Insurance Information HMO:     PPO:      PCP:      IPA:      80/20: yes     OTHER:  PRIMARY: Medicare A & B      Policy#: [id number]      Subscriber: patient CM Name:  Phone#:      Fax#:  Pre-Cert#:       Employer:  Benefits:  Phone #: verified eligibility online via [employer] on 02/05/21     Name:  Eff. Date: Part A & B effective 10/20/04     Deduct: $1,556      Out of Pocket Max: NA      Life Max: NA CIR: 100% coverage      SNF: 100% days 1-20, 80% days 21-100 Outpatient: 80%     Co-Pay: 20% Home Health: 100%      Co-Pay:  DME: 80%     Co-Pay: 20% Providers: pt's choice SECONDARY: BCBS Supplement      Policy#: [id number]     Phone#: [phone]   Financial Counselor:       Phone#:    The "Data Collection Information  Summary" for patients in Inpatient Rehabilitation Facilities with attached "Privacy Act [place] Records" was provided and verbally reviewed with: Patient and Family   Emergency Contact Information Contact Information       Name Relation Home Work Mobile    Corvera,[name] Spouse [phone]        [name],[name] Daughter     [phone]    [name], [name]     [phone]           Current Medical History  Patient Admitting Diagnosis: subdural hematoma   History of Present Illness:  80 year old right-handed female with history of memory loss maintained on Namenda/Aricept, diabetes mellitus, hypertension, CKD stage III, ACDF 12/22/2014, quit smoking 25 years ago.    Patient was using a rolling walker prior to admission but needing prompting as well as noted falls.  Husband assist with ADLs.  Presented 02/02/2021 with recurrent falls.  Spouse does have a part-time job and family provide supervision when he is out.  She was found to have an abrasion on the back of her head that did require 5 staples in the ED cranial CT scan showed a left occipital scalp 12 mm hematoma with no underlying calvarial fracture as well as 8 mm right parafalcine subdural hemorrhage extending inferiorly along the right tentorium.  CT cervical spine negative.  Admission chemistries unremarkable except glucose 154, creatinine 1.03, hemoglobin 10.5, urine culture greater 100,000 Klebsiella and placed on Keflex completing course of antibiotic.  Follow-up neurosurgery Dr. Duffy Rhody in regards to traumatic SDH advised conservative care with latest cranial CT scan showing no new or enlarging intracranial hemorrhage.  Patient was on low-dose aspirin 81 mg daily prior to admission advised to hold x1 week.  Bouts of orthostasis HCTZ was held.  Findings of elevated LFTs 02/07/2021 and monitored with ultrasound right upper quad showing an 8 mm GB polyp.  Her hep C antibody was positive.  Patient asymptomatic and reviewed  with gastroenterology Dr. Carlean Purl and felt elevated LFTs related to recent antibiotic therapy for UTI although alk phos appears to been elevated since March 2021.. .  Consideration for HIDA scan if remains elevated..  Patient's LFTs continue trending downward with improvement.  She is currently on a dysphagia #2 thin liquid diet.     Patient's medical record from [facility] has been reviewed by the rehabilitation admission coordinator and physician.   Past Medical History      Past Medical History:  Diagnosis Date   Contact dermatitis and other eczema, due to unspecified cause     Displacement of lumbar intervertebral disc without myelopathy     FH: cataracts     Gout,  unspecified     Memory loss     Occlusion and stenosis of carotid artery without mention of cerebral infarction     Other and unspecified hyperlipidemia     Pain in joint, shoulder region     Pain in limb     Routine general medical examination at a health care facility     Sciatica     Type II or unspecified type diabetes mellitus without mention of complication, uncontrolled     Unspecified essential hypertension     Unspecified venous (peripheral) insufficiency      Has the patient had major surgery during 100 days prior to admission? No   Family History   family history includes Alzheimer's disease in her mother; Cancer in her brother and brother; Coronary artery disease in her father; Diabetes in her mother; Hypertension in her father.   Current Medications   Current Facility-Administered Medications:    amLODipine (NORVASC) tablet 5 mg, 5 mg, Oral, Daily, Samtani, Jai-Gurmukh, MD, 5 mg at 02/10/21 1030   citalopram (CELEXA) tablet 10 mg, 10 mg, Oral, Daily, Posey Pronto, Vishal R, MD, 10 mg at 02/10/21 1030   memantine (NAMENDA XR) 24 hr capsule 28 mg, 28 mg, Oral, QHS, 28 mg at 02/09/21 2142 **AND** donepezil (ARICEPT) tablet 10 mg, 10 mg, Oral, QHS, Patel, Vishal R, MD, 10 mg at 02/09/21 2142   mupirocin  ointment (BACTROBAN) 2 %, , Nasal, BID, Nita Sells, MD, Given at 02/09/21 2142   ondansetron (ZOFRAN) tablet 4 mg, 4 mg, Oral, Q6H PRN **OR** ondansetron (ZOFRAN) injection 4 mg, 4 mg, Intravenous, Q6H PRN, Lenore Cordia, MD   propranolol (INDERAL) tablet 20 mg, 20 mg, Oral, BID, Zada Finders R, MD, 20 mg at 02/10/21 1030   Patients Current Diet:  Diet Order                  Diet - low sodium heart healthy             Diet regular Room service appropriate? No; Fluid consistency: Thin  Diet effective now                       Precautions / Restrictions Precautions Precautions: Fall Precaution Comments: Multiple recent falls. Restrictions Weight Bearing Restrictions: No    Has the patient had 2 or more falls or a fall with injury in the past year? Yes   Prior Activity Level Limited Community (1-2x/wk): gets out of house ~1day/week   Prior Functional Level Self Care: Did the patient need help bathing, dressing, using the toilet or eating? Needed some asisstance   Indoor Mobility: Did the patient need assistance with walking from room to room (with or without device)? Independent   Stairs: Did the patient need assistance with internal or external stairs (with or without device)? Independent   Functional Cognition: Did the patient need help planning regular tasks such as shopping or remembering to take medications? Dependent   Patient Information Are you of Hispanic, Latino/a,or Spanish origin?: X. Patient unable to respond, A. No, not of Hispanic, Latino/a, or Spanish origin What is your race?: X. Patient unable to respond, A. White Do you need or want an interpreter to communicate with a doctor or health care staff?: 9. Unable to respond   Patient's Response To:  Health Literacy and Transportation Is the patient able to respond to health literacy and transportation needs?: No   Home Assistive Devices / Equipment Home Equipment: Environmental consultant - 2 wheels,  Cane -  single point, Shower seat   Prior Device Use: Indicate devices/aids used by the patient prior to current illness, exacerbation or injury? Walker and cane   Current Functional Level Cognition   Arousal/Alertness: Awake/alert Overall Cognitive Status: History of cognitive impairments - at baseline Orientation Level: Oriented to person General Comments: Pt with history of dementia.  Oriented person only.  Not sure why at hospital.  Has difficulty following directional commands. Attention: Focused Focused Attention: Impaired Focused Attention Impairment: Verbal basic, Functional basic Memory: Impaired Memory Impairment: Decreased long term memory, Decreased short term memory Awareness: Impaired Awareness Impairment: Intellectual impairment, Emergent impairment, Anticipatory impairment Problem Solving: Impaired Problem Solving Impairment: Verbal basic, Functional basic Safety/Judgment: Impaired    Extremity Assessment (includes Sensation/Coordination)   Upper Extremity Assessment: Overall WFL for tasks assessed  Lower Extremity Assessment: Defer to PT evaluation RLE Deficits / Details: BLE scaling noted on calves. Pt has limited ROM, likely secondary to generalized weakness. LLE Deficits / Details: BLE scaling noted on calves. Pt has limited ROM, likely secondary to generalized weakness.     ADLs   Overall ADL's : Needs assistance/impaired Grooming: Wash/dry hands, Min guard, Standing Grooming Details (indicate cue type and reason): stood at sink for 3 minutes. Required cues for where to turn on water, where to get soap and where to get paper towels even when cued to attempt to find these things. Upper Body Bathing: Minimal assistance, Sitting Upper Body Bathing Details (indicate cue type and reason): throughness Lower Body Bathing: Moderate assistance, Sit to/from stand Upper Body Dressing : Minimal assistance, Sitting Lower Body Dressing: Moderate assistance, Sit to/from  stand Toilet Transfer: Minimal assistance, Ambulation, Regular Toilet, Grab bars, RW Toilet Transfer Details (indicate cue type and reason): Pt walked to bathroom with walker and min assist. Pt could not find toilet in the bathroom without max cues.  Pt was wearning glasses. Toileting- Clothing Manipulation and Hygiene: Moderate assistance, Sitting/lateral lean, Sit to/from stand Toileting - Clothing Manipulation Details (indicate cue type and reason): Pt unable to figure out what to do with toilet paper when placed in her hand.  Family stated this has become increasingly difficult at home. Functional mobility during ADLs: Minimal assistance General ADL Comments: Pt with sigificant cognitive deficits that affect ability to be safe with adls.     Mobility   Overal bed mobility: Needs Assistance Bed Mobility: Supine to Sit Supine to sit: Min assist, HOB elevated Sit to supine: Mod assist General bed mobility comments: in recliner     Transfers   Overall transfer level: Needs assistance Equipment used: Rolling walker (2 wheeled) Transfers: Sit to/from Stand Sit to Stand: Mod assist Stand pivot transfers: Min assist General transfer comment: lifting help to stand from recliner, pt placing hands on walker     Ambulation / Gait / Stairs / Wheelchair Mobility   Ambulation/Gait Ambulation/Gait assistance: Herbalist (Feet): 100 Feet Assistive device: Rolling walker (2 wheeled) Gait Pattern/deviations: Step-to pattern, Decreased stride length, Shuffle General Gait Details: cues for proximity to walker, assist and increased time for turning walker, pt with short shuffling steps Gait velocity: decr     Posture / Balance Balance Overall balance assessment: Needs assistance Sitting-balance support: Feet supported, Single extremity supported Sitting balance-Leahy Scale: Fair Postural control: Posterior lean Standing balance support: Single extremity supported, Bilateral upper  extremity supported, During functional activity Standing balance-Leahy Scale: Poor Standing balance comment: standing looking at coloring page on the wall pointing out colors with 1 UE support  working on upright posture, walker proximity, etc     Special needs/care consideration Fall precautions Decreased safety awareness    Previous Home Environment  Living Arrangements: Spouse/significant other  Lives With: Significant other Available Help at Discharge: Family, Available 24 hours/day Type of Home: House Home Layout: One level Home Access: Stairs to enter Entrance Stairs-Rails: Left Entrance Stairs-Number of Steps: 2 Bathroom Shower/Tub: Government social research officer Accessibility: No (daughter  unsure if a walker would fit inside bathroom) Morrison: No   Discharge Living Setting Plans for Discharge Living Setting: Patient's home Type of Home at Discharge: House Discharge Home Layout: One level Discharge Home Access: Stairs to enter Entrance Stairs-Rails: Left Entrance Stairs-Number of Steps: 2 Discharge Bathroom Shower/Tub: [place] unit Discharge Bathroom Toilet: Standard Discharge Bathroom Accessibility: No (daughter unsure if a walker will fit inside bathroom) Does the patient have any problems obtaining your medications?: No   Social/Family/Support Systems Anticipated Caregiver: Casimer Lanius, daughter, Neftali Thurow, husband, and hired help Anticipated Ambulance person Information: [name]: [phone]; Laverna Peace: [phone] Caregiver Availability: 24/7 Discharge Plan Discussed with Primary Caregiver: Yes Is Caregiver In Agreement with Plan?: Yes Does Caregiver/Family have Issues with Lodging/Transportation while Pt is in Rehab?: No   Goals Patient/Family Goal for Rehab: Supervision: PT/OT/ST Expected length of stay: 7-10 days Pt/Family Agrees to Admission and willing to participate: Yes Program Orientation Provided & Reviewed with  Pt/Caregiver Including Roles  & Responsibilities: Yes   Decrease burden of Care through IP rehab admission: NA   Possible need for SNF placement upon discharge: Not anticipated   Patient Condition: I have reviewed medical records from [facility], spoken with CM, and patient and daughter. I met with patient at the bedside and discussed via phone for inpatient rehabilitation assessment.  Patient will benefit from ongoing PT, OT, and SLP, can actively participate in 3 hours of therapy a day 5 days of the week, and can make measurable gains during the admission.  Patient will also benefit from the coordinated team approach during an Inpatient Acute Rehabilitation admission.  The patient will receive intensive therapy as well as Rehabilitation physician, nursing, social worker, and care management interventions.  Due to safety, skin/wound care, disease management, medication administration, pain management, and patient education the patient requires 24 hour a day rehabilitation nursing.  The patient is currently overall min to mod assist with mobility and basic ADLs.  Discharge setting and therapy post discharge at home with home health is anticipated.  Patient has agreed to participate in the Acute Inpatient Rehabilitation Program and will admit today.   Preadmission Screen Completed By:  Bethel Born, 02/10/2021 10:52 AM ______________________________________________________________________   Discussed status with Dr. Naaman Plummer on  02/10/2021 at  1055 and received approval for admission today.   Admission Coordinator:  Bethel Born, CCC-SLP, time  1055 Date 02/10/2021    Assessment/Plan: Diagnosis: right parafalcine SDH after fall Does the need for close, 24 hr/day Medical supervision in concert with the patient's rehab needs make it unreasonable for this patient to be served in a less intensive setting? Yes Co-Morbidities requiring supervision/potential complications: CKD III,  hep c with elevated LFT's, pain mgt Due to bladder management, bowel management, safety, skin/wound care, disease management, medication administration, pain management, and patient education, does the patient require 24 hr/day rehab nursing? Yes Does the patient require coordinated care of a physician, rehab nurse, PT, OT, and SLP to address physical and functional deficits in the context of the above medical  diagnosis(es)? Yes Addressing deficits in the following areas: balance, endurance, locomotion, strength, transferring, bowel/bladder control, bathing, dressing, feeding, grooming, toileting, cognition, and psychosocial support Can the patient actively participate in an intensive therapy program of at least 3 hrs of therapy 5 days a week? Yes The potential for patient to make measurable gains while on inpatient rehab is excellent Anticipated functional outcomes upon discharge from inpatient rehab: supervision PT, supervision OT, supervision SLP Estimated rehab length of stay to reach the above functional goals is: 7-10 days Anticipated discharge destination: Home 10. Overall Rehab/Functional Prognosis: excellent     MD Signature: Cynthia Staggers, MD, [place] [facility] 02/10/2021          Revision History                          Note Details  Author Cynthia Staggers, MD File Time 02/10/2021 11:11 AM  Author Type Physician Status Signed  Last Editor Cynthia Staggers, MD Service Physical Medicine and [place] # [id number] Admit Date 02/10/2021

## 2021-02-10 NOTE — TOC Transition Note (Signed)
Transition of Care (TOC) - CM/SW Discharge Note Marvetta Gibbons RN,BSN Transitions of Care Unit 4NP (Non Trauma)- RN Case Manager See Treatment Team for direct Phone #    Patient Details  Name: [name] MRN: [id number] Date of Birth: August 24, 1940  Transition of Care [facility]) CM/SW Contact:  Dawayne Patricia, RN Phone Number: 02/10/2021, 1:40 PM   Clinical Narrative:    Notified this am by Lavera Guise with [facility] IN[facility] rehab that pt has a bed for admission to rehab today. MD agreeable and has cleared pt for discharge to [facility] rehab.  INPT rehab will notify unit when bed available this afternoon.    Final next level of care: IP Rehab Facility Barriers to Discharge: Barriers Resolved   Patient Goals and CMS Choice Patient states their goals for this hospitalization and ongoing recovery are:: INPT rehab then home CMS Medicare.gov Compare Post Acute Care list provided to:: Patient Represenative (must comment) (daughter) Choice offered to / list presented to : Adult Children  Discharge Placement               [facility]        Discharge Plan and Services   Discharge Planning Services: CM Consult Post Acute Care Choice: IP Rehab, Home Health                               Social Determinants of Health (SDOH) Interventions     Readmission Risk Interventions Readmission Risk Prevention Plan 02/10/2021  Transportation Screening Complete  PCP or Specialist Appt within 5-7 Days Not Complete  Not Complete comments pt going to [place] Screening Complete  Medication Review (RN CM) Complete  Some recent data might be hidden

## 2021-02-10 NOTE — Progress Notes (Signed)
Patient ID: [name], female   DOB: February 05, 1941, 80 y.o.   MRN: [id number] INPATIENT REHABILITATION ADMISSION NOTE   Arrival Method: via bed      Mental Orientation: Alert and oriented x1   Assessment: per flowsheet    Skin: small incision/ non pressure wound on occiput and blanchable abrasion on Left buttock    IV'S: 20G x1 in left forearm    Pain: none    Tubes and Drains: none   Safety Measures: floor mats and siderailsx3   Vital Signs: per flowsheet VSS   Height and Weight: in chart    Rehab Orientation: completed    Family:Jimmy Doyle Askew notified    Notes:

## 2021-02-10 NOTE — Discharge Instructions (Addendum)
Inpatient Rehab Discharge Instructions  [name] Discharge date and time: No discharge date for patient encounter.   Activities/Precautions/ Functional Status: Activity: activity as tolerated Diet: Regular Wound Care: Routine skin checks Functional status:  ___ No restrictions     ___ Walk up steps independently ___ 24/7 supervision/assistance   ___ Walk up steps with assistance ___ Intermittent supervision/assistance  ___ Bathe/dress independently ___ Walk with walker     __x_ Bathe/dress with assistance ___ Walk Independently    ___ Shower independently ___ Walk with assistance    ___ Shower with assistance ___ No alcohol     ___ Return to work/school ________  COMMUNITY REFERRALS UPON DISCHARGE:    Home Health:   PT     OT     ST                   Agency: Phone:    Medical Equipment/Items Ordered: Wheelchair                                                 Agency/Supplier: [employer]   Special Instructions: No driving smoking or alcohol   My questions have been answered and I understand these instructions. I will adhere to these goals and the provided educational materials after my discharge from the hospital.  Patient/Caregiver Signature _______________________________ Date __________  Clinician Signature _______________________________________ Date __________  Please bring this form and your medication list with you to all your follow-up doctor's appointments.

## 2021-02-10 NOTE — Discharge Summary (Signed)
Physician Discharge Summary  [name] LFY:[id number] DOB: [birth year] DOA: 02/02/2021  PCP: Janith Lima, MD  Admit date: 02/02/2021 Discharge date: 02/10/2021  Time spent: 40 minutes  Recommendations for Outpatient Follow-up:  Follow outpatient CBC/CMP Staples need to be removed in next few days (7-14 days, placed on 9/14, remove between 9/21-28) Simvastatin and metformin currently on hold with liver function Follow pending hep c PCR BP meds changed - started on amlodipine - HCTZ and avapro d/c'd Aspirin restarted, continue for carotid artery disease while benefits outweigh risks - continue to discuss Follow elevated LFT's outpatient  Leukocytosis, no fevers, asx, follow  Discharge Diagnoses:  Principal Problem:   Subdural hematoma, post-traumatic (HCC) Active Problems:   Essential hypertension   DM (diabetes mellitus) type II controlled, neurological manifestation ([place])   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  History of present illness:  80 yo with hx alzheimers, T2DM, HTN, CKD IIIb who presented after recurrent suspected mechanical falls at home with head CT findings showing Fin Hupp subdural hematoma.      She was seen by neurosurgery.  Repeat imaging was stable.  Neurosurgery recommended holding aspirin for 1 week and outpatient follow up with PCP.   She's also been treated for klebsiella UTI.  See below for additional details  Hospital Course:  Recurrent fall  8 mm right parafalcine subdural hemorrhage  12 mm left scalp hematoma without fracture - fall initially 9/13, then came back to ED on 9/14  with repeat fall - imaging notable for 8 mm right parafalcine subdural hemorrhage, L occipital scalp 12 mm hematoma without calvarial fx - repeat head CT 9/15 stable with no new or enlarging intracranial hemorrhage, similar size and appearance of subdural hematoma, repair of L posterior scalp laceration, with trace subgaleal hematoma - neurosurgery recommended holding aspirin x1 week  - therapy recommending CIR - staples need to come out after 7-14 days (9/21-9/28)   40-59% bilateral ICA stenosis 03/2015 - resuming aspirin - hold statin with LFT's  UTI from prior to admission Urine cultures 9/13 growing >100,000 g Klebsiella S/p 5 days abx    Elevated transaminases-appreciate gastroenterology input No abdominal pain or nausea vomiting Hemodynamically mediated? Drug induced. RUQ with 8 mm echogenic focus along posterior wall of gallbladder - doubtful significance Hep C positive, pending PCR GI planning to follow Improving Hold statin and metformin for now   Hypertension with possible orthostatic hypotension Orthostatics unable to be accomplished - try tomorrow if possible continue propanolol 20 bid, add amlodipine 5 Avapro and HCTZ d/c'd - follow BP   Moderately controlled DM TY 2 Metformin 500 bid held--only eating 50% meals  Am sugars reasonable Hold metformin with elevated LFT's   Moderate to severe dementia Continue Namzaric (memantine/donepezil) 1 capsule at bedtime, continue Celexa 10 qd Orientable  Leukocytosis No obvious infection - afebrile Follow   Procedures: Laceration repair 9/14  Consultations: Neurosurgery GI  Discharge Exam: Vitals:   02/10/21 0831 02/10/21 1110  BP: (!) 163/50 (!) 113/54  Pulse: 71 64  Resp: 16 20  Temp: 98.2 F (36.8 C) 98.1 F (36.7 C)  SpO2: 98% 97%   Daughter over phone -  discussed d/c recommendations  No complaints from [name]  General: No acute distress. Staples to L scalp Cardiovascular: RRR Lungs: Clear to  auscultation bilaterally Abdomen: Soft, nontender, nondistended Neurological: Alert and pleasantly confused. Moves all extremities 4 . Cranial nerves II through XII grossly intact. Skin: Warm and dry. No rashes or lesions. Extremities: No clubbing or cyanosis. No edema.    Discharge Instructions   Discharge Instructions     Call MD for:  difficulty breathing, headache or visual disturbances   Complete by: As directed    Call MD for:  extreme fatigue   Complete by: As directed    Call MD for:  hives   Complete by: As directed    Call MD for:  persistant dizziness or light-headedness   Complete by: As directed    Call MD for:  persistant nausea and vomiting   Complete by: As directed    Call MD for:  redness, tenderness, or signs of infection (pain, swelling, redness, odor or green/yellow discharge around incision site)   Complete by: As directed    Call MD for:  severe uncontrolled pain   Complete by: As directed    Call MD for:  temperature >100.4   Complete by: As directed    Diet - low sodium heart healthy   Complete by: As directed    Discharge instructions   Complete by: As directed    You were seen for Kamalani Mastro brain bleed.  This was stable on repeat imaging.  You have staples that should be removed after 7-14 days (between 9/21 and 9/28).  Would recommend PM&R doctors reevaluate to determine when to remove.  We're going to resume your aspirin at this time with your carotid artery disease.  Your statin is on hold at this time due to your abnormal liver function.  We're holding your metformin as well.  Your blood pressure medicines have been adjusted.  HCTZ and avapro have been discontinued.  We've added amlodipine and propanolol.    Return for new, recurrent, or worsening symptoms.  Please ask your PCP to request records from this hospitalization so they know what was done and what the next steps will be.   Discharge wound care:   Complete by: As directed    Remove staples  between 9/21 and 9/28  Continue local wound care for stage 2 decub   Increase activity slowly   Complete by: As directed       Allergies as of 02/10/2021       Reactions   Pneumococcal Vaccines Swelling   Per pt when received 2015   Wound Dressing Adhesive Other (See Comments)   Band-Aids will PULL OFF THE SKIN if left on for Jonthan Leite decent amount of time   Ampicillin Hives   Neomycin-bacitracin Zn-polymyx Rash   Penicillins Hives        Medication List     STOP taking these medications    cefdinir 300 MG capsule Commonly known as: OMNICEF   hydrochlorothiazide 25 MG tablet Commonly known as: HYDRODIURIL   irbesartan 150 MG tablet Commonly known as: AVAPRO   metFORMIN 500 MG tablet Commonly known as: GLUCOPHAGE   simvastatin 20 MG tablet Commonly known as: ZOCOR       TAKE these medications    amLODipine 5 MG tablet Commonly known as: NORVASC Take 1 tablet (5 mg total) by mouth daily.   aspirin 81 MG EC tablet Take 1 tablet (81 mg total) by mouth daily. Swallow whole.  citalopram 20 MG tablet Commonly known as: CeleXA Take 1 tablet (20 mg total) by mouth daily. What changed: how much to take   levocetirizine 5 MG tablet Commonly known as: XYZAL TAKE 1 TABLET(5 MG) BY MOUTH EVERY EVENING What changed: See the new instructions.   Namzaric 28-10 MG Cp24 Generic drug: Memantine HCl-Donepezil HCl Take 1 capsule at bedtime   propranolol 20 MG tablet Commonly known as: INDERAL TAKE 1 TABLET(20 MG) BY MOUTH TWICE DAILY What changed: See the new instructions.               Discharge Care Instructions  (From admission, onward)           Start     Ordered   02/10/21 0000  Discharge wound care:       Comments: Remove staples between 9/21 and 9/28  Continue local wound care for stage 2 decub   02/10/21 1035           Allergies  Allergen Reactions   Pneumococcal Vaccines Swelling    Per pt when received 2015   Wound Dressing Adhesive  Other (See Comments)    Band-Aids will PULL OFF THE SKIN if left on for Avo Schlachter decent amount of time   Ampicillin Hives   Neomycin-Bacitracin Zn-Polymyx Rash   Penicillins Hives      The results of significant diagnostics from this hospitalization (including imaging, microbiology, ancillary and laboratory) are listed below for reference.    Significant Diagnostic Studies: CT HEAD WO CONTRAST (5MM)  Result Date: 02/03/2021 CLINICAL DATA:  Subdural hematoma.  Follow-up. EXAM: CT HEAD WITHOUT CONTRAST TECHNIQUE: Contiguous axial images were obtained from the base of the skull through the vertex without intravenous contrast. COMPARISON:  CT head, 02/02/2021. FINDINGS: Brain: No evidence of acute infarction, hydrocephalus, or mass lesion/mass effect. Relatively similar size and appearance of parafalcine and tentorial extra-axial collection, accounting for mild redistribution, measuring up to 0.9 cm at the level of the falx cerebri (previously 0.9 cm when remeasured. Vascular: No hyperdense vessel or unexpected calcification. Skull: Normal. Negative for fracture or focal lesion. Incidental hyperostosis frontalis. Sinuses/Orbits: No acute finding. Mild paranasal sinus mucosal thickening. Other: Repaired LEFT posterior scalp laceration, with trace subgaleal hematoma. IMPRESSION: Since CT head dated 02/02/2021 at 1857 hours; 1. No new or enlarging intracranial hemorrhage. 2. Similar size and appearance of subdural hematoma along falx and tentorium, measuring up to 0.9 cm. 3. Repair of LEFT posterior scalp laceration, with trace subgaleal hematoma. Electronically Signed   By: [name] M.D.   On: 02/03/2021 09:51   CT HEAD WO CONTRAST (5MM)  Result Date: 02/02/2021 CLINICAL DATA:  Recurrent fall, new occipital laceration. EXAM: CT HEAD WITHOUT CONTRAST CT CERVICAL SPINE WITHOUT CONTRAST TECHNIQUE: Multidetector CT imaging of the head and cervical spine was performed following the standard protocol without  intravenous contrast. Multiplanar CT image reconstructions of the cervical spine were also generated. COMPARISON:  CT head and cervical spine 02/01/2021 FINDINGS: CT HEAD FINDINGS BRAIN: BRAIN Patchy and confluent areas of decreased attenuation are noted throughout the deep and periventricular white matter of the cerebral hemispheres bilaterally, compatible with chronic microvascular ischemic disease. No evidence of large-territorial acute infarction. No parenchymal hemorrhage. No mass lesion. Interval development of Maikel Neisler right parafalcine hyperdense extra-axial fluid extending along the right tentorium. On coronal this measures up to 8 mm along the falx cerebral (4:28). No mass effect or midline shift. No hydrocephalus. Basilar cisterns are patent. Vascular: No hyperdense vessel. Atherosclerotic calcifications are present within the  cavernous internal carotid arteries. Skull: No acute fracture or focal lesion. Sinuses/Orbits: Paranasal sinuses and mastoid air cells are clear. The orbits are unremarkable. Other: Left occipital scalp 12 mm hematoma with associated subcutaneus soft tissue edema and emphysema. No retained radiopaque foreign body. CT CERVICAL SPINE FINDINGS Alignment: Grade 1 anterolisthesis of C3 on C4. Skull base and vertebrae: C4 through C7 anterior and interbody fusion. Multilevel severe degenerative changes of the spine. Severe bilateral osseous neural foraminal stenosis at the C3-C4 level. No acute fracture. No aggressive appearing focal osseous lesion or focal pathologic process. Soft tissues and spinal canal: No prevertebral fluid or swelling. No visible canal hematoma. Upper chest: Unremarkable. Other: None. IMPRESSION: 1. An 8 mm right parafalcine subdural hemorrhage extending inferiorly along the right tentorium. 2. Left occipital scalp 12 mm hematoma with no underlying calvarial fracture. 3. No acute displaced fracture or traumatic listhesis of the cervical spine. 4. C4 - C7 anterior and  interbody fusion. Multilevel severe degenerative changes of the spine with severe bilateral osseous neural foraminal stenosis at the C3-C4 level. These results were called by telephone at the time of interpretation on 02/02/2021 at 7:26 pm to provider [facility] , who verbally acknowledged these results. Electronically Signed   By: Iven Finn M.D.   On: 02/02/2021 19:29   CT Head Wo Contrast  Result Date: 02/01/2021 CLINICAL DATA:  Head and neck trauma. EXAM: CT HEAD WITHOUT CONTRAST CT CERVICAL SPINE WITHOUT CONTRAST TECHNIQUE: Multidetector CT imaging of the head and cervical spine was performed following the standard protocol without intravenous contrast. Multiplanar CT image reconstructions of the cervical spine were also generated. COMPARISON:  December 16, 2020. FINDINGS: CT HEAD FINDINGS Brain: Mild chronic ischemic white matter disease is noted. No mass effect or midline shift is noted. Ventricular size is within normal limits. There is no evidence of mass lesion, hemorrhage or acute infarction. Vascular: No hyperdense vessel or unexpected calcification. Skull: Normal. Negative for fracture or focal lesion. Sinuses/Orbits: No acute finding. Other: Small left posterior scalp hematoma is noted. CT CERVICAL SPINE FINDINGS Alignment: Mild grade 1 anterolisthesis of C3-4 is noted secondary to posterior facet joint hypertrophy. Skull base and vertebrae: No acute fracture. No primary bone lesion or focal pathologic process. Soft tissues and spinal canal: No prevertebral fluid or swelling. No visible canal hematoma. Disc levels: Status post surgical anterior fusion of C4-5, C5-6 and C6-7. Severe degenerative disc disease is noted at C3-4 and C7-T1. Upper chest: Negative. Other: None. IMPRESSION: Small left posterior scalp hematoma. No acute intracranial abnormality seen. Postsurgical and degenerative changes are noted in the cervical spine as described above. No acute abnormality is noted.  Electronically Signed   By: Marijo Conception M.D.   On: 02/01/2021 15:00   CT Cervical Spine Wo Contrast  Result Date: 02/02/2021 CLINICAL DATA:  Recurrent fall, new occipital laceration. EXAM: CT HEAD WITHOUT CONTRAST CT CERVICAL SPINE WITHOUT CONTRAST TECHNIQUE: Multidetector CT imaging of the head and cervical spine was performed following the standard protocol without intravenous contrast. Multiplanar CT image reconstructions of the cervical spine were also generated. COMPARISON:  CT head and cervical spine 02/01/2021 FINDINGS: CT HEAD FINDINGS BRAIN: BRAIN Patchy and confluent areas of decreased attenuation are noted throughout the deep and periventricular white matter of the cerebral hemispheres bilaterally, compatible with chronic microvascular ischemic disease. No evidence of large-territorial acute infarction. No parenchymal hemorrhage. No mass lesion. Interval development of Jann Milkovich right parafalcine hyperdense extra-axial fluid extending along the right tentorium. On coronal this measures up  to 8 mm along the falx cerebral (4:28). No mass effect or midline shift. No hydrocephalus. Basilar cisterns are patent. Vascular: No hyperdense vessel. Atherosclerotic calcifications are present within the cavernous internal carotid arteries. Skull: No acute fracture or focal lesion. Sinuses/Orbits: Paranasal sinuses and mastoid air cells are clear. The orbits are unremarkable. Other: Left occipital scalp 12 mm hematoma with associated subcutaneus soft tissue edema and emphysema. No retained radiopaque foreign body. CT CERVICAL SPINE FINDINGS Alignment: Grade 1 anterolisthesis of C3 on C4. Skull base and vertebrae: C4 through C7 anterior and interbody fusion. Multilevel severe degenerative changes of the spine. Severe bilateral osseous neural foraminal stenosis at the C3-C4 level. No acute fracture. No aggressive appearing focal osseous lesion or focal pathologic process. Soft tissues and spinal canal: No prevertebral  fluid or swelling. No visible canal hematoma. Upper chest: Unremarkable. Other: None. IMPRESSION: 1. An 8 mm right parafalcine subdural hemorrhage extending inferiorly along the right tentorium. 2. Left occipital scalp 12 mm hematoma with no underlying calvarial fracture. 3. No acute displaced fracture or traumatic listhesis of the cervical spine. 4. C4 - C7 anterior and interbody fusion. Multilevel severe degenerative changes of the spine with severe bilateral osseous neural foraminal stenosis at the C3-C4 level. These results were called by telephone at the time of interpretation on 02/02/2021 at 7:26 pm to provider [facility] , who verbally acknowledged these results. Electronically Signed   By: Iven Finn M.D.   On: 02/02/2021 19:29   CT Cervical Spine Wo Contrast  Result Date: 02/01/2021 CLINICAL DATA:  Head and neck trauma. EXAM: CT HEAD WITHOUT CONTRAST CT CERVICAL SPINE WITHOUT CONTRAST TECHNIQUE: Multidetector CT imaging of the head and cervical spine was performed following the standard protocol without intravenous contrast. Multiplanar CT image reconstructions of the cervical spine were also generated. COMPARISON:  December 16, 2020. FINDINGS: CT HEAD FINDINGS Brain: Mild chronic ischemic white matter disease is noted. No mass effect or midline shift is noted. Ventricular size is within normal limits. There is no evidence of mass lesion, hemorrhage or acute infarction. Vascular: No hyperdense vessel or unexpected calcification. Skull: Normal. Negative for fracture or focal lesion. Sinuses/Orbits: No acute finding. Other: Small left posterior scalp hematoma is noted. CT CERVICAL SPINE FINDINGS Alignment: Mild grade 1 anterolisthesis of C3-4 is noted secondary to posterior facet joint hypertrophy. Skull base and vertebrae: No acute fracture. No primary bone lesion or focal pathologic process. Soft tissues and spinal canal: No prevertebral fluid or swelling. No visible canal hematoma. Disc  levels: Status post surgical anterior fusion of C4-5, C5-6 and C6-7. Severe degenerative disc disease is noted at C3-4 and C7-T1. Upper chest: Negative. Other: None. IMPRESSION: Small left posterior scalp hematoma. No acute intracranial abnormality seen. Postsurgical and degenerative changes are noted in the cervical spine as described above. No acute abnormality is noted. Electronically Signed   By: Marijo Conception M.D.   On: 02/01/2021 15:00   US Abdomen Limited RUQ (LIVER/GB)  Result Date: 02/07/2021 CLINICAL DATA:  Elevated liver function tests. EXAM: ULTRASOUND ABDOMEN LIMITED RIGHT UPPER QUADRANT COMPARISON:  None. FINDINGS: Gallbladder: No gallbladder wall thickening or shadowing gallstone. Small echogenic focus along the posterior wall noted on image 28 could be Sencere Symonette sessile polyp or some sludge. The significance is doubtful. Common bile duct: Diameter: 1.5 mm.  Normal. Liver: No focal lesion identified. Within normal limits in parenchymal echogenicity. Portal vein is patent on color Doppler imaging with normal direction of blood flow towards the liver. Other: None. IMPRESSION: Normal  study with exception of an 8 mm echogenic focus along the posterior wall of the gallbladder image 28. This could be some sludge or Adreona Brand sessile polyp. The significance is doubtful. Electronically Signed   By: Nelson Chimes M.D.   On: 02/07/2021 09:54    Microbiology: Recent Results (from the past 240 hour(s))  Urine Culture     Status: Abnormal   Collection Time: 02/01/21  2:10 PM   Specimen: Urine, Clean Catch  Result Value Ref Range Status   Specimen Description URINE, CLEAN CATCH  Final   Special Requests   Final    NONE Performed at [facility], [place] [address] [place], [place] [address]    Culture >=100,000 COLONIES/mL KLEBSIELLA PNEUMONIAE (Prithvi Kooi)  Final   Report Status 02/04/2021 FINAL  Final   Organism ID, Bacteria KLEBSIELLA PNEUMONIAE (Amariah Kierstead)  Final      Susceptibility   Klebsiella pneumoniae - MIC*     AMPICILLIN RESISTANT Resistant     CEFAZOLIN <=4 SENSITIVE Sensitive     CEFEPIME <=0.12 SENSITIVE Sensitive     CEFTRIAXONE <=0.25 SENSITIVE Sensitive     CIPROFLOXACIN <=0.25 SENSITIVE Sensitive     GENTAMICIN <=1 SENSITIVE Sensitive     IMIPENEM 1 SENSITIVE Sensitive     NITROFURANTOIN 32 SENSITIVE Sensitive     TRIMETH/SULFA <=20 SENSITIVE Sensitive     AMPICILLIN/SULBACTAM 4 SENSITIVE Sensitive     PIP/TAZO <=4 SENSITIVE Sensitive     * >=100,000 COLONIES/mL KLEBSIELLA PNEUMONIAE  Resp Panel by RT-PCR (Flu Daiveon Markman&B, Covid) Nasopharyngeal Swab     Status: None   Collection Time: 02/02/21  7:36 PM   Specimen: Nasopharyngeal Swab; Nasopharyngeal(NP) swabs in vial transport medium  Result Value Ref Range Status   SARS Coronavirus 2 by RT PCR NEGATIVE NEGATIVE Final    Comment: (NOTE) SARS-CoV-2 target nucleic acids are NOT DETECTED.  The SARS-CoV-2 RNA is generally detectable in upper respiratory specimens during the acute phase of infection. The lowest concentration of SARS-CoV-2 viral copies this assay can detect is 138 copies/mL. Aster Eckrich negative result does not preclude SARS-Cov-2 infection and should not be used as the sole basis for treatment or other patient management decisions. Allexis Bordenave negative result may occur with  improper specimen collection/handling, submission of specimen other than nasopharyngeal swab, presence of viral mutation(s) within the areas targeted by this assay, and inadequate number of viral copies(<138 copies/mL). Breiona Couvillon negative result must be combined with clinical observations, patient history, and epidemiological information. The expected result is Negative.  Fact Sheet for Patients:  EntrepreneurPulse.com.au  Fact Sheet for Healthcare Providers:  IncredibleEmployment.be  This test is no t yet approved or cleared by the [place] FDA and  has been authorized for detection and/or diagnosis of SARS-CoV-2 by FDA under an  Emergency Use Authorization (EUA). This EUA will remain  in effect (meaning this test can be used) for the duration of the COVID-19 declaration under Section 564(b)(1) of the Act, 21 U.S.C.section 360bbb-3(b)(1), unless the authorization is terminated  or revoked sooner.       Influenza Abbigayle Toole by PCR NEGATIVE NEGATIVE Final   Influenza B by PCR NEGATIVE NEGATIVE Final    Comment: (NOTE) The Xpert Xpress SARS-CoV-2/FLU/RSV plus assay is intended as an aid in the diagnosis of influenza from Nasopharyngeal swab specimens and should not be used as Britnie Colville sole basis for treatment. Nasal washings and aspirates are unacceptable for Xpert Xpress SARS-CoV-2/FLU/RSV testing.  Fact Sheet for Patients: EntrepreneurPulse.com.au  Fact Sheet for Healthcare Providers: IncredibleEmployment.be  This  test is not yet approved or cleared by the [place] and has been authorized for detection and/or diagnosis of SARS-CoV-2 by FDA under an Emergency Use Authorization (EUA). This EUA will remain in effect (meaning this test can be used) for the duration of the COVID-19 declaration under Section 564(b)(1) of the Act, 21 U.S.C. section 360bbb-3(b)(1), unless the authorization is terminated or revoked.  Performed at [facility], [place] [address], [place], [place] [address]   MRSA Next Gen by PCR, Nasal     Status: Abnormal   Collection Time: 02/03/21  3:24 PM   Specimen: Nasal Mucosa; Nasal Swab  Result Value Ref Range Status   MRSA by PCR Next Gen DETECTED (Laureen Frederic) NOT DETECTED Final    Comment: RESULT CALLED TO, READ BACK BY AND VERIFIED WITH: Alyson Locket RN [id number] 02/03/21 Wai Litt BROWNING (NOTE) The GeneXpert MRSA Assay (FDA approved for NASAL specimens only), is one component of Odelia Graciano comprehensive MRSA colonization surveillance program. It is not intended to diagnose MRSA infection nor to guide or monitor treatment for MRSA infections. Test performance is not FDA approved in  patients less than 81 years old. Performed at [facility], [place] [address] [place], [place] [address]      Labs: Basic Metabolic Panel: Recent Labs  Lab 02/06/21 0255 02/07/21 0248 02/08/21 0016 02/09/21 0340 02/10/21 0350  NA 135 135 135 135 136  K 3.6 3.8 3.6 3.5 3.4*  CL 103 102 105 101 102  CO2 24 22 22 24 24   GLUCOSE 133* 120* 101* 138* 118*  BUN 27* 27* 21 21 25*  CREATININE 1.08* 1.14* 0.91 1.03* 1.03*  CALCIUM 8.7* 8.4* 8.6* 8.9 9.0   Liver Function Tests: Recent Labs  Lab 02/04/21 0131 02/07/21 0248 02/08/21 0016 02/09/21 0340 02/10/21 0350  AST 26 364* 183* 127* 74*  ALT 30 219* 175* 156* 114*  ALKPHOS 166* 386* 355* 447* 397*  BILITOT 1.1 1.2 1.0 1.0 1.2  PROT 6.1* 6.0* 6.2* 6.9 6.7  ALBUMIN 2.6* 2.4* 2.6* 2.8* 2.8*   No results for input(s): LIPASE, AMYLASE in the last 168 hours. No results for input(s): AMMONIA in the last 168 hours. CBC: Recent Labs  Lab 02/04/21 0131 02/08/21 0016 02/09/21 0340 02/10/21 0350  WBC 9.8 9.9 13.3* 14.1*  NEUTROABS 6.1 6.0 9.3* 9.5*  HGB 9.1* 9.7* 10.8* 10.0*  HCT 28.3* 29.1* 33.1* 31.3*  MCV 87.6 86.4 86.6 86.9  PLT 418* 416* 478* 496*   Cardiac Enzymes: Recent Labs  Lab 02/08/21 0016  CKTOTAL 44   BNP: BNP (last 3 results) No results for input(s): BNP in the last 8760 hours.  ProBNP (last 3 results) No results for input(s): PROBNP in the last 8760 hours.  CBG: No results for input(s): GLUCAP in the last 168 hours.     Signed:  Fayrene Helper MD.  [facility] 02/10/2021, 11:16 AM

## 2021-02-10 NOTE — H&P (Signed)
Physical Medicine and Rehabilitation Admission H&P        Chief Complaint  Patient presents with   Fall  : HPI: [name] is a 80 year old right-handed female with history of memory loss maintained on Namenda/Aricept, diabetes mellitus, hypertension, CKD stage III, ACDF 12/22/2014, quit smoking 25 years ago.  Per chart review patient lives with spouse.  1 level home 2 steps to entry.  Patient was using a rolling walker prior to admission but needing prompting as well as noted falls.  Husband assist with ADLs.  Presented 02/02/2021 with recurrent falls.  Spouse does have a part-time job and family provide supervision when he is out.  She was found to have an abrasion on the back of her head that did require 5 staples in the ED cranial CT scan showed a left occipital scalp 12 mm hematoma with no underlying calvarial fracture as well as 8 mm right parafalcine subdural hemorrhage extending inferiorly along the right tentorium.  CT cervical spine negative.  Admission chemistries unremarkable except glucose 154, creatinine 1.03, hemoglobin 10.5, urine culture greater 100,000 Klebsiella and placed on Keflex completing course of antibiotic.  Follow-up neurosurgery Dr. Duffy Rhody in regards to traumatic SDH advised conservative care with latest cranial CT scan showing no new or enlarging intracranial hemorrhage.  Patient was on low-dose aspirin 81 mg daily prior to admission advised to hold x1 week.  Bouts of orthostasis HCTZ was held.  Findings of elevated LFTs 02/07/2021 and monitored with ultrasound right upper quad showing an 8 mm GB polyp.  Her hep C antibody was positive.  Patient asymptomatic and reviewed with gastroenterology Dr. Carlean Purl and felt elevated LFTs related to recent antibiotic therapy for UTI although alk phos appears to been elevated since March 2021.. .  Consideration for HIDA scan if remains elevated..  Patient's LFTs continue trending downward with improvement.  She is currently on  a dysphagia #2 thin liquid diet.  Therapy evaluations completed due to patient decreased functional mobility cognitive deficits was admitted for a comprehensive rehab program.     Review of Systems  Constitutional:  Negative for chills and fever.  HENT:  Negative for hearing loss.   Eyes:  Negative for blurred vision and double vision.  Respiratory:  Negative for cough and shortness of breath.   Cardiovascular:  Negative for chest pain, palpitations and leg swelling.  Gastrointestinal:  Positive for constipation. Negative for heartburn, nausea and vomiting.  Genitourinary:  Negative for dysuria, flank pain and hematuria.  Musculoskeletal:  Positive for falls, joint pain and myalgias.  Skin:  Negative for rash.  Psychiatric/Behavioral:  Positive for depression and memory loss.   All other systems reviewed and are negative.     Past Medical History:  Diagnosis Date   Contact dermatitis and other eczema, due to unspecified cause     Displacement of lumbar intervertebral disc without myelopathy     FH: cataracts     Gout, unspecified     Memory loss     Occlusion and stenosis of carotid artery without mention of cerebral infarction     Other and unspecified hyperlipidemia     Pain in joint, shoulder region     Pain in limb     Routine general medical examination at a health care facility     Sciatica     Type II or unspecified type diabetes mellitus without mention of complication, uncontrolled     Unspecified essential hypertension     Unspecified venous (peripheral)  insufficiency           Past Surgical History:  Procedure Laterality Date   ANTERIOR CERVICAL DECOMP/DISCECTOMY FUSION N/A 12/22/2014    Procedure: Anterior Cervical Four-Five/Five-Six/Six-Seven Decompression/Diskectomy/ and Fsion;  Surgeon: Leeroy Cha, MD;  Location: [facility];  Service: Neurosurgery;  Laterality: N/A;  C4-5 C5-6 C6-7 Anterior cervical decompression/diskectomy/fusion   LAPAROSCOPIC APPENDECTOMY        LUMBAR LAMINECTOMY   07/2008    [place]   TUBAL LIGATION             Family History  Problem Relation Age of Onset   Alzheimer's disease Mother     Diabetes Mother     Coronary artery disease Father          CABG   Hypertension Father     Cancer Brother     Cancer Brother     Colon cancer Neg Hx     Breast cancer Neg Hx      Social History:  reports that she quit smoking about 25 years ago. Her smoking use included cigarettes. She has never used smokeless tobacco. She reports that she does not drink alcohol and does not use drugs. Allergies:       Allergies  Allergen Reactions   Pneumococcal Vaccines Swelling      Per pt when received 2015   Wound Dressing Adhesive Other (See Comments)      Band-Aids will PULL OFF THE SKIN if left on for a decent amount of time   Ampicillin Hives   Neomycin-Bacitracin Zn-Polymyx Rash   Penicillins Hives          Medications Prior to Admission  Medication Sig Dispense Refill   aspirin 81 MG EC tablet Take 1 tablet (81 mg total) by mouth daily. Swallow whole. 30 tablet 12   cefdinir (OMNICEF) 300 MG capsule Take 1 capsule (300 mg total) by mouth 2 (two) times daily. (Patient taking differently: Take 300 mg by mouth 2 (two) times daily. Start date: 02/02/21) 14 capsule 0   citalopram (CELEXA) 20 MG tablet Take 1 tablet (20 mg total) by mouth daily. (Patient taking differently: Take 10 mg by mouth daily.) 30 tablet 5   hydrochlorothiazide (HYDRODIURIL) 25 MG tablet TAKE 1 TABLET(25 MG) BY MOUTH DAILY (Patient taking differently: Take 25 mg by mouth in the morning.) 90 tablet 1   irbesartan (AVAPRO) 150 MG tablet Take 150 mg by mouth daily.       levocetirizine (XYZAL) 5 MG tablet TAKE 1 TABLET(5 MG) BY MOUTH EVERY EVENING (Patient taking differently: Take 5 mg by mouth every evening.) 90 tablet 1   metFORMIN (GLUCOPHAGE) 500 MG tablet TAKE 1 TABLET(500 MG) BY MOUTH TWICE DAILY WITH A MEAL (Patient taking differently: Take 500 mg by mouth 2 (two)  times daily with a meal.) 180 tablet 1   propranolol (INDERAL) 20 MG tablet TAKE 1 TABLET(20 MG) BY MOUTH TWICE DAILY (Patient taking differently: Take 20 mg by mouth 2 (two) times daily.) 180 tablet 0   simvastatin (ZOCOR) 20 MG tablet TAKE 1 TABLET(20 MG) BY MOUTH DAILY AT 6 PM (Patient taking differently: Take 20 mg by mouth every evening.) 90 tablet 1   Memantine HCl-Donepezil HCl (NAMZARIC) 28-10 MG CP24 Take 1 capsule at bedtime (Patient not taking: No sig reported) 90 capsule 1      Drug Regimen Review Drug regimen was reviewed and remains appropriate with no significant issues identified   Home: Home Living Family/patient expects to be discharged to::  Private residence Living Arrangements: Spouse/significant other Available Help at Discharge: Family, Available 24 hours/day Type of Home: House Home Access: Stairs to enter [employer] of Steps: 2 Entrance Stairs-Rails: Left Home Layout: One level Bathroom Shower/Tub: Chiropodist: Standard Bathroom Accessibility: No (daughter  unsure if a walker would fit inside bathroom) Home Equipment: Environmental consultant - 2 wheels, [employer] - single point, Civil engineer, contracting  Lives With: Significant other   Functional History: Prior Function Level of Independence: Needs assistance Gait / Transfers Assistance Needed: chart suggests patient was supposed to walk with a RW ADL's / Homemaking Assistance Needed: Assume assist with home management, meds, meal prep, community mobility and ADL support   Functional Status:  Mobility: Bed Mobility Overal bed mobility: Needs Assistance Bed Mobility: Supine to Sit Supine to sit: Min assist, HOB elevated Sit to supine: Mod assist General bed mobility comments: in recliner Transfers Overall transfer level: Needs assistance Equipment used: Rolling walker (2 wheeled) Transfers: Sit to/from Stand Sit to Stand: Mod assist Stand pivot transfers: Min assist General transfer comment: lifting  help to stand from recliner, pt placing hands on walker Ambulation/Gait Ambulation/Gait assistance: Min assist Gait Distance (Feet): 100 Feet Assistive device: Rolling walker (2 wheeled) Gait Pattern/deviations: Step-to pattern, Decreased stride length, Shuffle General Gait Details: cues for proximity to walker, assist and increased time for turning walker, pt with short shuffling steps Gait velocity: decr   ADL: ADL Overall ADL's : Needs assistance/impaired Grooming: Wash/dry hands, Min guard, Standing Grooming Details (indicate cue type and reason): stood at sink for 3 minutes. Required cues for where to turn on water, where to get soap and where to get paper towels even when cued to attempt to find these things. Upper Body Bathing: Minimal assistance, Sitting Upper Body Bathing Details (indicate cue type and reason): throughness Lower Body Bathing: Moderate assistance, Sit to/from stand Upper Body Dressing : Minimal assistance, Sitting Lower Body Dressing: Moderate assistance, Sit to/from stand Toilet Transfer: Minimal assistance, Ambulation, Regular Toilet, Grab bars, RW Toilet Transfer Details (indicate cue type and reason): Pt walked to bathroom with walker and min assist. Pt could not find toilet in the bathroom without max cues.  Pt was wearning glasses. Toileting- Clothing Manipulation and Hygiene: Moderate assistance, Sitting/lateral lean, Sit to/from stand Toileting - Clothing Manipulation Details (indicate cue type and reason): Pt unable to figure out what to do with toilet paper when placed in her hand.  Family stated this has become increasingly difficult at home. Functional mobility during ADLs: Minimal assistance General ADL Comments: Pt with sigificant cognitive deficits that affect ability to be safe with adls.   Cognition: Cognition Overall Cognitive Status: History of cognitive impairments - at baseline Arousal/Alertness: Awake/alert Orientation Level: Oriented to  person Attention: Focused Focused Attention: Impaired Focused Attention Impairment: Verbal basic, Functional basic Memory: Impaired Memory Impairment: Decreased long term memory, Decreased short term memory Awareness: Impaired Awareness Impairment: Intellectual impairment, Emergent impairment, Anticipatory impairment Problem Solving: Impaired Problem Solving Impairment: Verbal basic, Functional basic Safety/Judgment: Impaired Cognition Arousal/Alertness: Awake/alert Behavior During Therapy: Flat affect Overall Cognitive Status: History of cognitive impairments - at baseline General Comments: Pt with history of dementia.  Oriented person only.  Not sure why at hospital.  Has difficulty following directional commands.   Physical Exam: Blood pressure 128/82, pulse 69, temperature 98.5 F (36.9 C), temperature source Oral, resp. rate 18, SpO2 95 %. Physical Exam Constitutional:      Appearance: Normal appearance.  HENT:     Head: Normocephalic.  Comments: Scalp incision clean and dry    Right Ear: External ear normal.     Left Ear: External ear normal.     Nose: Nose normal.     Mouth/Throat:     Mouth: Mucous membranes are dry.  Eyes:     Extraocular Movements: Extraocular movements intact.     Pupils: Pupils are equal, round, and reactive to light.  Cardiovascular:     Rate and Rhythm: Normal rate and regular rhythm.     Heart sounds: No murmur heard. Pulmonary:     Effort: Pulmonary effort is normal. No respiratory distress.     Breath sounds: No wheezing.  Abdominal:     General: Bowel sounds are normal. There is no distension.     Palpations: Abdomen is soft.  Musculoskeletal:     Cervical back: Normal range of motion.     Comments: Right metarsal area and 1st MTP red,extremely tender to touch, swollen.  Skin:    General: Skin is warm.     Comments: Skin of both lower extremities and feet very dry.  Neurological:     Mental Status: She is alert.     Comments:  Patient is alert and made eye contact with examiner.  Follows simple commands.  Provides her name only.  Patient is pleasantly confused and very tearful at times. Oriented to person only. Normal language. UE 4/5. LE 3-4/5 prox to 4/5 distal with RLE limited by pain in right foot. Sensation intact to PP and LT  Psychiatric:     Comments: Flat but generally cooperate      Lab Results Last 48 Hours        Results for orders placed or performed during the hospital encounter of 02/02/21 (from the past 48 hour(s))  Comprehensive metabolic panel     Status: Abnormal    Collection Time: 02/09/21  3:40 AM  Result Value Ref Range    Sodium 135 135 - 145 mmol/L    Potassium 3.5 3.5 - 5.1 mmol/L    Chloride 101 98 - 111 mmol/L    CO2 24 22 - 32 mmol/L    Glucose, Bld 138 (H) 70 - 99 mg/dL      Comment: Glucose reference range applies only to samples taken after fasting for at least 8 hours.    BUN 21 8 - 23 mg/dL    Creatinine, Ser 1.03 (H) 0.44 - 1.00 mg/dL    Calcium 8.9 8.9 - 10.3 mg/dL    Total Protein 6.9 6.5 - 8.1 g/dL    Albumin 2.8 (L) 3.5 - 5.0 g/dL    AST 127 (H) 15 - 41 U/L    ALT 156 (H) 0 - 44 U/L    Alkaline Phosphatase 447 (H) 38 - 126 U/L    Total Bilirubin 1.0 0.3 - 1.2 mg/dL    GFR, Estimated 55 (L) >60 mL/min      Comment: (NOTE) Calculated using the CKD-EPI Creatinine Equation (2021)      Anion gap 10 5 - 15      Comment: Performed at [facility], [place] [address], [place], [place] [address]  CBC with Differential/Platelet     Status: Abnormal    Collection Time: 02/09/21  3:40 AM  Result Value Ref Range    WBC 13.3 (H) 4.0 - 10.5 K/uL    RBC 3.82 (L) 3.87 - 5.11 MIL/uL    Hemoglobin 10.8 (L) 12.0 - 15.0 g/dL    HCT 33.1 (L) 36.0 -  46.0 %    MCV 86.6 80.0 - 100.0 fL    MCH 28.3 26.0 - 34.0 pg    MCHC 32.6 30.0 - 36.0 g/dL    RDW 16.0 (H) 11.5 - 15.5 %    Platelets 478 (H) 150 - 400 K/uL    nRBC 0.0 0.0 - 0.2 %    Neutrophils Relative % 69 %    Neutro Abs 9.3  (H) 1.7 - 7.7 K/uL    Lymphocytes Relative 17 %    Lymphs Abs 2.2 0.7 - 4.0 K/uL    Monocytes Relative 9 %    Monocytes Absolute 1.2 (H) 0.1 - 1.0 K/uL    Eosinophils Relative 4 %    Eosinophils Absolute 0.5 0.0 - 0.5 K/uL    Basophils Relative 0 %    Basophils Absolute 0.1 0.0 - 0.1 K/uL    Immature Granulocytes 1 %    Abs Immature Granulocytes 0.06 0.00 - 0.07 K/uL      Comment: Performed at [facility] [address] [place], [place] [address]  Comprehensive metabolic panel     Status: Abnormal    Collection Time: 02/10/21  3:50 AM  Result Value Ref Range    Sodium 136 135 - 145 mmol/L    Potassium 3.4 (L) 3.5 - 5.1 mmol/L    Chloride 102 98 - 111 mmol/L    CO2 24 22 - 32 mmol/L    Glucose, Bld 118 (H) 70 - 99 mg/dL      Comment: Glucose reference range applies only to samples taken after fasting for at least 8 hours.    BUN 25 (H) 8 - 23 mg/dL    Creatinine, Ser 1.03 (H) 0.44 - 1.00 mg/dL    Calcium 9.0 8.9 - 10.3 mg/dL    Total Protein 6.7 6.5 - 8.1 g/dL    Albumin 2.8 (L) 3.5 - 5.0 g/dL    AST 74 (H) 15 - 41 U/L    ALT 114 (H) 0 - 44 U/L    Alkaline Phosphatase 397 (H) 38 - 126 U/L    Total Bilirubin 1.2 0.3 - 1.2 mg/dL    GFR, Estimated 55 (L) >60 mL/min      Comment: (NOTE) Calculated using the CKD-EPI Creatinine Equation (2021)      Anion gap 10 5 - 15      Comment: Performed at [facility], [place] [address], [place], [place] [address]  CBC with Differential/Platelet     Status: Abnormal    Collection Time: 02/10/21  3:50 AM  Result Value Ref Range    WBC 14.1 (H) 4.0 - 10.5 K/uL    RBC 3.60 (L) 3.87 - 5.11 MIL/uL    Hemoglobin 10.0 (L) 12.0 - 15.0 g/dL    HCT 31.3 (L) 36.0 - 46.0 %    MCV 86.9 80.0 - 100.0 fL    MCH 27.8 26.0 - 34.0 pg    MCHC 31.9 30.0 - 36.0 g/dL    RDW 15.9 (H) 11.5 - 15.5 %    Platelets 496 (H) 150 - 400 K/uL    nRBC 0.0 0.0 - 0.2 %    Neutrophils Relative % 67 %    Neutro Abs 9.5 (H) 1.7 - 7.7 K/uL    Lymphocytes  Relative 20 %    Lymphs Abs 2.8 0.7 - 4.0 K/uL    Monocytes Relative 9 %    Monocytes Absolute 1.3 (H) 0.1 - 1.0 K/uL    Eosinophils  Relative 3 %    Eosinophils Absolute 0.4 0.0 - 0.5 K/uL    Basophils Relative 0 %    Basophils Absolute 0.1 0.0 - 0.1 K/uL    Immature Granulocytes 1 %    Abs Immature Granulocytes 0.07 0.00 - 0.07 K/uL      Comment: Performed at [facility] [address], [place], [place] [address]      Imaging Results (Last 48 hours)  No results found.           Medical Problem List and Plan: 1.  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  Conservative care per neurosurgery.Remove Scalp staples in 7-14 days.             -patient may  shower             -ELOS/Goals: 7-10 days, supervision goals 2.  Antithrombotics: -DVT/anticoagulation:  Mechanical: Sequential compression devices, below knee Bilateral lower extremities             -antiplatelet therapy: Aspirin 81 mg daily on hold x1 week 3. Pain Management/Gout flare right foot: Tylenol as needed             -prednisone taper, avoid tight fitting shoes and socks 4. Mood: Celexa 10 mg daily, Namenda 28 mg nightly, Aricept 10 mg daily             -antipsychotic agents: N/A 5. Neuropsych: This patient is not capable of making decisions on her own behalf. 6. Skin/Wound Care: Routine skin checks             -eucerin cream BLE 7. Fluids/Electrolytes/Nutrition: Routine in and outs with follow-up chemistries 8.  CKD.  Creatinine baseline 1.03-1.39.  Follow-up chemistries 9.  Diabetes mellitus.  Hemoglobin A1c 6.2.  Metformin 500 mg twice daily prior to admission and resume as needed. 10.  Klebsiella UTI.  Keflex completed. 11.  Hyperlipidemia.  Hold Zocor 20 mg daily secondary to elevated LFTs 12.  Orthostasis.  Currently on Inderal 20 mg twice daily as well as low-dose Norvasc 5 mg daily initiated 02/08/2021.   13.Elevated Transaminases.   Ultrasound right upper quadrant showed 8 mm GB polyp.  Felt possibly due to recent antibiotic therapy for UTI.  Consider HIDA scan if remains elevated.  Patient asymptomatic at present. Has good appetite 14.  Dysphagia.  Dysphagia #2 thin liquids.  Speech therapy follow-up      Cathlyn Parsons, PA-C 02/10/2021   I have personally performed a face to face diagnostic evaluation of this patient and formulated the key components of the plan.  Additionally, I have personally reviewed laboratory data, imaging studies, as well as relevant notes and concur with the physician assistant's documentation above.  The patient's status has not changed from the original H&P.  Any changes in documentation from the acute care chart have been noted above.  [name], MD, [facility]

## 2021-02-10 NOTE — Progress Notes (Signed)
Inpatient Rehabilitation Medication Review by a Pharmacist  A complete drug regimen review was completed for this patient to identify any potential clinically significant medication issues.  High Risk Drug Classes Is patient taking? Indication by Medication  Antipsychotic No   Anticoagulant No   Antibiotic No   Opioid No   Antiplatelet No   Hypoglycemics/insulin No   Vasoactive Medication Yes Amlodipine, propranolol for BP  Chemotherapy No   Other No      Type of Medication Issue Identified Description of Issue Recommendation(s)  Drug Interaction(s) (clinically significant)     Duplicate Therapy     Allergy     No Medication Administration End Date     Incorrect Dose     Additional Drug Therapy Needed     Significant med changes from prior encounter (inform family/care partners about these prior to discharge). Aspirin stopped for now   Other       Clinically significant medication issues were identified that warrant physician communication and completion of prescribed/recommended actions by midnight of the next day:  No   Pharmacist comments: None   Time spent performing this drug regimen review (minutes): 20 minutes  Thank you Anette Guarneri, PharmD 02/10/2021 4:48 PM

## 2021-02-10 NOTE — Progress Notes (Signed)
Inpatient Rehabilitation Admissions Coordinator   I have CIR bed to admit her today. I contacted Dr Florene Glen and he will d/c. I met at bedside with patient and her daughter. They are in agreement. Acute team and TOC made aware.  Danne Baxter, RN, MSN Rehab Admissions Coordinator [phone] 02/10/2021 10:33 AM

## 2021-02-11 DIAGNOSIS — S065X2D Traumatic subdural hemorrhage with loss of consciousness of 31 minutes to 59 minutes, subsequent encounter: Secondary | ICD-10-CM

## 2021-02-11 DIAGNOSIS — R7401 Elevation of levels of liver transaminase levels: Secondary | ICD-10-CM

## 2021-02-11 DIAGNOSIS — M10071 Idiopathic gout, right ankle and foot: Secondary | ICD-10-CM

## 2021-02-11 DIAGNOSIS — N1831 Chronic kidney disease, stage 3a: Secondary | ICD-10-CM

## 2021-02-11 LAB — CBC WITH DIFFERENTIAL/PLATELET
Abs Immature Granulocytes: 0.04 10*3/uL (ref 0.00–0.07)
Basophils Absolute: 0 10*3/uL (ref 0.0–0.1)
Basophils Relative: 0 %
Eosinophils Absolute: 0 10*3/uL (ref 0.0–0.5)
Eosinophils Relative: 0 %
HCT: 33 % — ABNORMAL LOW (ref 36.0–46.0)
Hemoglobin: 10.5 g/dL — ABNORMAL LOW (ref 12.0–15.0)
Immature Granulocytes: 0 %
Lymphocytes Relative: 10 %
Lymphs Abs: 1.2 10*3/uL (ref 0.7–4.0)
MCH: 27.9 pg (ref 26.0–34.0)
MCHC: 31.8 g/dL (ref 30.0–36.0)
MCV: 87.8 fL (ref 80.0–100.0)
Monocytes Absolute: 0.1 10*3/uL (ref 0.1–1.0)
Monocytes Relative: 1 %
Neutro Abs: 11.3 10*3/uL — ABNORMAL HIGH (ref 1.7–7.7)
Neutrophils Relative %: 89 %
Platelets: 516 10*3/uL — ABNORMAL HIGH (ref 150–400)
RBC: 3.76 MIL/uL — ABNORMAL LOW (ref 3.87–5.11)
RDW: 15.9 % — ABNORMAL HIGH (ref 11.5–15.5)
WBC: 12.8 10*3/uL — ABNORMAL HIGH (ref 4.0–10.5)
nRBC: 0 % (ref 0.0–0.2)

## 2021-02-11 LAB — COMPREHENSIVE METABOLIC PANEL
ALT: 86 U/L — ABNORMAL HIGH (ref 0–44)
AST: 45 U/L — ABNORMAL HIGH (ref 15–41)
Albumin: 2.6 g/dL — ABNORMAL LOW (ref 3.5–5.0)
Alkaline Phosphatase: 384 U/L — ABNORMAL HIGH (ref 38–126)
Anion gap: 9 (ref 5–15)
BUN: 31 mg/dL — ABNORMAL HIGH (ref 8–23)
CO2: 24 mmol/L (ref 22–32)
Calcium: 9.1 mg/dL (ref 8.9–10.3)
Chloride: 103 mmol/L (ref 98–111)
Creatinine, Ser: 1.15 mg/dL — ABNORMAL HIGH (ref 0.44–1.00)
GFR, Estimated: 48 mL/min — ABNORMAL LOW (ref >60)
Glucose, Bld: 215 mg/dL — ABNORMAL HIGH (ref 70–99)
Potassium: 3.8 mmol/L (ref 3.5–5.1)
Sodium: 136 mmol/L (ref 135–145)
Total Bilirubin: 1.2 mg/dL (ref 0.3–1.2)
Total Protein: 6.9 g/dL (ref 6.5–8.1)

## 2021-02-11 NOTE — Progress Notes (Signed)
[facility] PHYSICAL MEDICINE & REHABILITATION PROGRESS NOTE  Subjective/Complaints: Patient seen sitting up in her chair this morning.  She states she slept well overnight.  She states she is ready begin therapies today.  ROS: Denies CP, SOB, N/V/D  Objective: Vital Signs: Blood pressure (!) 143/60, pulse (!) 58, temperature 98 F (36.7 C), resp. rate 15, height 5' 2.01" (1.575 m), weight 57.8 kg, SpO2 97 %. No results found. Recent Labs    02/10/21 0350 02/11/21 0545  WBC 14.1* 12.8*  HGB 10.0* 10.5*  HCT 31.3* 33.0*  PLT 496* 516*   Recent Labs    02/10/21 0350 02/11/21 0545  NA 136 136  K 3.4* 3.8  CL 102 103  CO2 24 24  GLUCOSE 118* 215*  BUN 25* 31*  CREATININE 1.03* 1.15*  CALCIUM 9.0 9.1    Intake/Output Summary (Last 24 hours) at 02/11/2021 1419 Last data filed at 02/10/2021 1758 Gross per 24 hour  Intake 0 ml  Output --  Net 0 ml        Physical Exam: BP (!) 143/60 (BP Location: Left Arm)   Pulse (!) 58   Temp 98 F (36.7 C)   Resp 15   Ht 5' 2.01" (1.575 m)   Wt 57.8 kg   SpO2 97%   BMI 23.30 kg/m  Constitutional: No distress . Vital signs reviewed. HENT: Normocephalic.  Scalp incision CDI Eyes: EOMI. No discharge. Cardiovascular: No JVD.  RRR. Respiratory: Normal effort.  No stridor.  Bilateral clear to auscultation. GI: Non-distended.  BS +. Skin: Warm and dry.  Intact. Psych: Flat..  Slowed. Musc: Right metarsal area and 1st MTP red, improved tenderness and edema.  Neuro: Alert Follows simple commands.   Motor: UE 4-/5.  RLE: Hip flexion, knee extension 3+/5, ankle dorsiflexion 4/5 LLE: 3 -/5 proximal distal  Assessment/Plan: 1. Functional deficits which require 3+ hours per day of interdisciplinary therapy in a comprehensive inpatient rehab setting. Physiatrist is providing close team supervision and 24 hour management of active medical problems listed below. Physiatrist and rehab team continue to assess barriers to  discharge/monitor patient progress toward functional and medical goals   Care Tool:  Bathing    Body parts bathed by patient: Right arm, Left arm, Chest, Abdomen, Face, Left lower leg, Right lower leg, Left upper leg, Right upper leg     Body parts n/a: Buttocks, Front perineal area   Bathing assist Assist Level: Moderate Assistance - Patient 50 - 74%     Upper Body Dressing/Undressing Upper body dressing   What is the patient wearing?: Pull over shirt    Upper body assist Assist Level: Maximal Assistance - Patient 25 - 49%    Lower Body Dressing/Undressing Lower body dressing      What is the patient wearing?: Pants, Incontinence brief     Lower body assist Assist for lower body dressing: Total Assistance - Patient < 25%     Toileting Toileting    Toileting assist Assist for toileting: Maximal Assistance - Patient 25 - 49%     Transfers Chair/bed transfer  Transfers assist           Locomotion Ambulation   Ambulation assist              Walk 10 feet activity   Assist           Walk 50 feet activity   Assist           Walk 150 feet activity  Assist           Walk 10 feet on uneven surface  activity   Assist           Wheelchair     Assist               Wheelchair 50 feet with 2 turns activity    Assist            Wheelchair 150 feet activity     Assist           Medical Problem List and Plan: 1.  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  Conservative care per neurosurgery.Remove Scalp staples in 7-14 days.  Begin CIR evaluations 2.  Antithrombotics: -DVT/anticoagulation:  Mechanical: Sequential compression devices, below knee Bilateral lower extremities             -antiplatelet therapy: Aspirin 81 mg daily on hold x1 week 3. Pain Management/Gout flare right foot: Tylenol as needed             -prednisone  taper, avoid tight fitting shoes and socks  Improving 4. Mood: Celexa 10 mg daily, Namenda 28 mg nightly, Aricept 10 mg daily             -antipsychotic agents: N/A 5. Neuropsych: This patient is not capable of making decisions on her own behalf. 6. Skin/Wound Care: Routine skin checks             -eucerin cream BLE 7. Fluids/Electrolytes/Nutrition: Routine in and outs 8.  CKD.  Creatinine baseline 1.03-1.39.    Creatinine 1.15 on 9/23  Encourage fluids 9.  Diabetes mellitus.  Hemoglobin A1c 6.2.  Metformin 500 mg twice daily prior to admission and resume as needed.  Monitor his increase mobility 10.  Klebsiella UTI.  Keflex completed. 11.  Hyperlipidemia.  Hold Zocor 20 mg daily secondary to elevated LFTs 12.  Orthostasis.  Currently on Inderal 20 mg twice daily as well as low-dose Norvasc 5 mg daily initiated 02/08/2021.    Monitor with increased activity 13. Elevated Transaminases.  Ultrasound right upper quadrant showed 8 mm GB polyp.  Felt possibly due to recent antibiotic therapy for UTI.  Consider HIDA scan if remains elevated.  Patient asymptomatic at present. 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   Advanced to D3 thins, continue to advance as tolerated  LOS: 1 days A FACE TO FACE EVALUATION WAS PERFORMED  Lucian Baswell Lorie Phenix 02/11/2021, 2:19 PM

## 2021-02-11 NOTE — Progress Notes (Signed)
Inpatient Rehabilitation Care Coordinator Assessment and Plan Patient Details  Name: [name] MRN: [id number] Date of Birth: 02/04/1941  Today's Date: 02/11/2021  Hospital Problems: Principal Problem:   Traumatic subdural hematoma [facility])  Past Medical History:  Past Medical History:  Diagnosis Date   Contact dermatitis and other eczema, due to unspecified cause    Displacement of lumbar intervertebral disc without myelopathy    FH: cataracts    Gout, unspecified    Memory loss    Occlusion and stenosis of carotid artery without mention of cerebral infarction    Other and unspecified hyperlipidemia    Pain in joint, shoulder region    Pain in limb    Routine general medical examination at a health care facility    Sciatica    Type II or unspecified type diabetes mellitus without mention of complication, uncontrolled    Unspecified essential hypertension    Unspecified venous (peripheral) insufficiency    Past Surgical History:  Past Surgical History:  Procedure Laterality Date   ANTERIOR CERVICAL DECOMP/DISCECTOMY FUSION N/A 12/22/2014   Procedure: Anterior Cervical Four-Five/Five-Six/Six-Seven Decompression/Diskectomy/ and Fsion;  Surgeon: Leeroy Cha, MD;  Location: [facility];  Service: Neurosurgery;  Laterality: N/A;  C4-5 C5-6 C6-7 Anterior cervical decompression/diskectomy/fusion   LAPAROSCOPIC APPENDECTOMY     LUMBAR LAMINECTOMY  07/2008   [place]   TUBAL LIGATION     Social History:  reports that she quit smoking about 25 years ago. Her smoking use included cigarettes. She has never used smokeless tobacco. She reports that she does not drink alcohol and does not use drugs.  Family / Support Systems Patient Roles: Spouse Spouse/Significant Other: Be[place] May Children: [name] and Laverna Peace Anticipated Caregiver: Daughter, spouse and hired assistance Ability/Limitations of Caregiver: n/a Caregiver Availability: 24/7 Family Dynamics: spouse works PT, family provides  supervision  Social History Preferred language: English Religion: [employer] - How often do you need to have someone help you when you read instructions, pamphlets, or other written material from your doctor or pharmacy?: Always Writes: Yes Employment Status: Retired   Abuse/Neglect Abuse/Neglect Assessment Can Be Completed: Unable to assess, patient is non-responsive or altered mental status Physical Abuse: Denies Verbal Abuse: Denies Sexual Abuse: Denies Exploitation of patient/patient's resources: Denies Self-Neglect: Denies  Patient response to: Social Isolation - How often do you feel lonely or isolated from those around you?: Rarely  Emotional Status Pt's affect, behavior and adjustment status: memory loss Recent Psychosocial Issues: n/a Psychiatric History: n/a Substance Abuse History: hx of smoking (25 years ago)  Patient / Family Perceptions, Expectations & Goals Premorbid pt/family roles/activities: spouse assist with ADLS, Anticipated changes in roles/activities/participation: spouse able to cont to provide assistance Pt/family expectations/goals: Warden/ranger Agencies: None Premorbid Home Care/DME Agencies: Other (Comment) ([employer], Single [employer], Civil engineer, contracting) Transportation available at discharge: spouse able to transport Is the patient able to respond to transportation needs?: No  Discharge Planning Living Arrangements: Spouse/significant other Support Systems: Spouse/significant other, Children Type of Residence: Private residence Insurance Resources: [employer], Multimedia programmer (specify) Nurse, mental health Supplement) Financial Resources: Family Support Financial Screen Referred: No Living Expenses: Lives with family Money Management: Spouse Does the patient have any problems obtaining your medications?: No Home Management: Spouse provides assistance Patient/Family Preliminary Plans: Able to continue Care  Coordinator Barriers to Discharge: Decreased caregiver support Care Coordinator Anticipated Follow Up Needs: HH/OP Expected length of stay: 7-10 Days  Clinical Impression Sw met with pt and daughter at bedside. Introduced self, explained role  and addressed questions and concerns. Pt will d/c back home with spouse, daughter and hired assistance. Information already has been provided. No additional questions or concern, sw will continue to follow up  Dyanne Iha 02/11/2021, 1:18 PM

## 2021-02-11 NOTE — Progress Notes (Signed)
Patient ID: [name], female   DOB: 23-Feb-1941, 80 y.o.   MRN: [id number] Met with the patient and daughter to introduce self and role. Discussed medications and secondary risks including HTN, HLD (Trig 250) w transaminitis, and CKD. Discussed dietary modifications recommended to manage risks. Handouts given on DM, HTN, DASH diet, liver  and kidney friendly foods. Daughter notes patient appears more fidgity today than normal and is used to having the TV on more for noise and pictures than anything and the TV in her room is not working at present. Work order placed for maintenance to check the TV and nurse notified of status for prn. Patient denies discomfort. Continue to follow along to discharge to address educational needs and collaborate with the team to facilitate preparation for discharge. Margarito Liner

## 2021-02-11 NOTE — IPOC Note (Signed)
Individualized overall Plan of Care [facility]) Patient Details Name: [name] MRN: [id number] DOB: July 09, 1940  Admitting Diagnosis: Traumatic subdural hematoma [facility])  Hospital Problems: Principal Problem:   Traumatic subdural hematoma ([place]) Active Problems:   Transaminitis     Functional Problem List: Nursing Bladder, Bowel, Medication Management, Safety, Skin Integrity, Endurance  PT Balance, Behavior, Edema, Endurance, Motor, Nutrition, Pain, Perception, Safety, Skin Integrity, Sensory  OT Balance, Behavior, Cognition, Edema, Endurance, Motor, Perception, Safety, Vision  SLP Cognition, Linguistic  TR         Basic ADL's: OT Eating, Grooming, Bathing, Dressing, Toileting     Advanced  ADL's: OT Simple Meal Preparation, Light Housekeeping, Laundry, Full Meal Preparation     Transfers: PT Bed Mobility, Bed to Chair, Car, Furniture, Editor, commissioning, Agricultural engineer: PT Ambulation, Emergency planning/management officer, Stairs     Additional Impairments: OT    SLP Communication, Social Cognition, Swallowing expression, comprehension Memory, Attention, Awareness, Problem Solving  TR      Anticipated Outcomes Item Anticipated Outcome  Self Feeding Supervision  Swallowing  sup A   Basic self-care  min LB; S UB  Toileting  min   Bathroom Transfers MIN  Bowel/Bladder  Manage bowel and bladder with mod I assist  Transfers  CGA with LRAD  Locomotion  ambulatory with LRAD and CGA  Communication  mod A  Cognition  mod A  Pain  n/a  Safety/Judgment  manage w cues   Therapy Plan: PT Intensity: Minimum of 1-2 x/day ,45 to 90 minutes PT Frequency: 5 out of 7 days PT Duration Estimated Length of Stay: 18-21 days OT Intensity: Minimum of 1-2 x/day, 45 to 90 minutes OT Frequency: 5 out of 7 days OT Duration/Estimated Length of Stay: 18-21 days SLP Intensity: Minumum of 1-2 x/day, 30 to 90 minutes SLP Frequency: 3 to 5 out of 7 days SLP Duration/Estimated Length of  Stay: 18-21 days    Team Interventions: Nursing Interventions Disease Management/Prevention, Bladder Management, Bowel Management, Patient/Family Education, Medication Management, Discharge Planning, Skin Care/Wound Management  PT interventions Ambulation/gait training, Balance/vestibular training, Cognitive remediation/compensation, Disease management/prevention, Discharge planning, Community reintegration, DME/adaptive equipment instruction, Functional electrical stimulation, Functional mobility training, Patient/family education, Pain management, Neuromuscular re-education, Psychosocial support, Skin care/wound management, Splinting/orthotics, Therapeutic Exercise, Therapeutic Activities, Stair training, UE/LE Strength taining/ROM, UE/LE Coordination activities, Visual/perceptual remediation/compensation, Wheelchair propulsion/positioning  OT Interventions Training and development officer, Cognitive remediation/compensation, Discharge planning, Disease mangement/prevention, DME/adaptive equipment instruction, Functional mobility training, Pain management, Patient/family education, Psychosocial support, Self Care/advanced ADL retraining, Therapeutic Activities, Therapeutic Exercise, UE/LE Coordination activities, Visual/perceptual remediation/compensation  SLP Interventions Environmental controls, Internal/external aids, Cognitive remediation/compensation, Cueing hierarchy, Dysphagia/aspiration precaution training, Functional tasks, Patient/family education  TR Interventions    SW/CM Interventions Discharge Planning, Psychosocial Support, Patient/Family Education, Disease Management/Prevention   Barriers to Discharge MD  Medical stability and Behavior  Nursing Decreased caregiver support, Home environment access/layout, Wound Care 1 level 2 ste left rail w spouse  PT Inaccessible home environment, Home environment access/layout, Behavior, Decreased caregiver support, Lack of/limited family support,  Medication compliance, Incontinence    OT  (cognition)    SLP      SW Decreased caregiver support     Team Discharge Planning: Destination: PT-Home ,OT- Home , SLP-Home Projected Follow-up: PT-Home health PT, OT-  Home health OT, SLP-24 hour supervision/assistance, Home Health SLP Projected Equipment Needs: PT-To be determined, Rolling walker with 5" wheels, Wheelchair (measurements), Wheelchair cushion (measurements), OT- To be determined, SLP-None recommended by SLP Equipment Details:  PT- , OT-  Patient/family involved in discharge planning: PT- Patient,  OT-Patient unable/family or caregiver not available, SLP-Family member/caregiver  MD ELOS: 17-20 days. Medical Rehab Prognosis:  Good Assessment: 80 year old right-handed female with history of memory loss maintained on Namenda/Aricept, diabetes mellitus, hypertension, CKD stage III, ACDF 12/22/2014, quit smoking 25 years ago.  1 level home 2 steps to entry.  Patient was using a rolling walker prior to admission but needing prompting as well as noted falls.  Husband assist with ADLs.  Presented 02/02/2021 with recurrent falls.  Spouse does have a part-time job and family provide supervision when he is out.  She was found to have an abrasion on the back of her head that did require 5 staples in the ED cranial CT scan showed a left occipital scalp 12 mm hematoma with no underlying calvarial fracture as well as 8 mm right parafalcine subdural hemorrhage extending inferiorly along the right tentorium.  CT cervical spine negative.  Admission chemistries unremarkable except glucose 154, creatinine 1.03, hemoglobin 10.5, urine culture greater 100,000 Klebsiella and placed on Keflex completing course of antibiotic.  Follow-up neurosurgery Dr. Duffy Rhody in regards to traumatic SDH advised conservative care with latest cranial CT scan showing no new or enlarging intracranial hemorrhage.  Patient was on low-dose aspirin 81 mg daily prior to admission  advised to hold x1 week.  Bouts of orthostasis HCTZ was held.  Findings of elevated LFTs 02/07/2021 and monitored with ultrasound right upper quad showing an 8 mm GB polyp.  Her hep C antibody was positive.  Patient asymptomatic and reviewed with gastroenterology Dr. Carlean Purl and felt elevated LFTs related to recent antibiotic therapy for UTI although alk phos appears to been elevated since March 2021.  Consideration for HIDA scan if remains elevated. Patient's LFTs continue trending downward with improvement.  She is currently on a dysphagia #3 thin liquid diet.  Therapy evaluations completed due to patient decreased functional mobility cognitive, swallowing deficits was admitted for a comprehensive rehab program.  We will set goals for Min A with PT/OT/SLP.  Due to the current state of emergency, patients may not be receiving their 3-hours of Medicare-mandated therapy.  See Team Conference Notes for weekly updates to the plan of care

## 2021-02-11 NOTE — Progress Notes (Signed)
Inpatient Rehabilitation  Patient information reviewed and entered into eRehab system by Jaidah Lomax Alinda Egolf, OTR/L.   Information including medical coding, functional ability and quality indicators will be reviewed and updated through discharge.    

## 2021-02-11 NOTE — Plan of Care (Signed)
  Problem: RH Cognition - SLP Goal: RH LTG Patient will demonstrate orientation with cues Description:  LTG:  Patient will demonstrate orientation to person/place/time/situation with cues (SLP)   Flowsheets (Taken 02/11/2021 1712) LTG Patient will demonstrate orientation to:  Person  Place  Situation LTG: Patient will demonstrate orientation using cueing (SLP): Moderate Assistance - Patient 50 - 74%   Problem: RH Comprehension Communication Goal: LTG Patient will comprehend basic/complex auditory (SLP) Description: LTG: Patient will comprehend basic/complex auditory information with cues (SLP). Flowsheets (Taken 02/11/2021 1712) LTG: Patient will comprehend: Basic auditory information LTG: Patient will comprehend auditory information with cueing (SLP): Moderate Assistance - Patient 50 - 74%   Problem: RH Expression Communication Goal: LTG Patient will verbally express basic/complex needs(SLP) Description: LTG:  Patient will verbally express basic/complex needs, wants or ideas with cues  (SLP) Flowsheets (Taken 02/11/2021 1712) LTG: Patient will verbally express basic/complex needs, wants or ideas (SLP): Moderate Assistance - Patient 50 - 74%   Problem: RH Attention Goal: LTG Patient will demonstrate this level of attention during functional activites (SLP) Description: LTG:  Patient will will demonstrate this level of attention during functional activites (SLP) Flowsheets (Taken 02/11/2021 1712) Patient will demonstrate during cognitive/linguistic activities the attention type of: Focused Patient will demonstrate this level of attention during cognitive/linguistic activities in: Controlled LTG: Patient will demonstrate this level of attention during cognitive/linguistic activities with assistance of (SLP): Moderate Assistance - Patient 50 - 74%

## 2021-02-11 NOTE — Progress Notes (Signed)
[facility] Individual Statement of Services  Patient Name:  [name]  Date:  02/11/2021  Welcome to the [facility].  Our goal is to provide you with an individualized program based on your diagnosis and situation, designed to meet your specific needs.  With this comprehensive rehabilitation program, you will be expected to participate in at least 3 hours of rehabilitation therapies Monday-Friday, with modified therapy programming on the weekends.  Your rehabilitation program will include the following services:  Physical Therapy (PT), Occupational Therapy (OT), Speech Therapy (ST), 24 hour per day rehabilitation nursing, Therapeutic Recreaction (TR), Neuropsychology, Care Coordinator, Rehabilitation Medicine, Nutrition Services, Pharmacy Services, and Other  Weekly team conferences will be held on Wednesdays to discuss your progress.  Your Inpatient Rehabilitation Care Coordinator will talk with you frequently to get your input and to update you on team discussions.  Team conferences with you and your family in attendance may also be held.  Expected length of stay:  7-10 Days  Overall anticipated outcome:  Supervision  Depending on your progress and recovery, your program may change. Your Inpatient Rehabilitation Care Coordinator will coordinate services and will keep you informed of any changes. Your Inpatient Rehabilitation Care Coordinator's name and contact numbers are listed  below.  The following services may also be recommended but are not provided by the [facility]:   [place] will be made to provide these services after discharge if needed.  Arrangements include referral to agencies that provide these services.  Your insurance has been verified to be:   Medicare A & B Your primary doctor is:   Scarlette Calico, MD  Pertinent information will  be shared with your doctor and your insurance company.  Inpatient Rehabilitation Care Coordinator:  Erlene Quan, [place] or [phone]  Information discussed with and copy given to patient by: Dyanne Iha, 02/11/2021, 11:34 AM

## 2021-02-11 NOTE — Evaluation (Addendum)
Speech Language Pathology Assessment and Plan  Patient Details  Name: [name] MRN: [id number] Date of Birth: 1940-07-24  SLP Diagnosis: Cognitive Impairments;Other (comment) (Cognitive-communication)  Rehab Potential: Good ELOS: 18-21 days   Today's Date: 02/11/2021 SLP Individual Time: 1100-1200 SLP Individual Time Calculation (min): 60 min  Hospital Problem: Principal Problem:   Traumatic subdural hematoma (HCC) Active Problems:   Transaminitis  Past Medical History:  Past Medical History:  Diagnosis Date   Contact dermatitis and other eczema, due to unspecified cause    Displacement of lumbar intervertebral disc without myelopathy    FH: cataracts    Gout, unspecified    Memory loss    Occlusion and stenosis of carotid artery without mention of cerebral infarction    Other and unspecified hyperlipidemia    Pain in joint, shoulder region    Pain in limb    Routine general medical examination at a health care facility    Sciatica    Type II or unspecified type diabetes mellitus without mention of complication, uncontrolled    Unspecified essential hypertension    Unspecified venous (peripheral) insufficiency    Past Surgical History:  Past Surgical History:  Procedure Laterality Date   ANTERIOR CERVICAL DECOMP/DISCECTOMY FUSION N/A 12/22/2014   Procedure: Anterior Cervical Four-Five/Five-Six/Six-Seven Decompression/Diskectomy/ and Fsion;  Surgeon: Leeroy Cha, MD;  Location: [facility];  Service: Neurosurgery;  Laterality: N/A;  C4-5 C5-6 C6-7 Anterior cervical decompression/diskectomy/fusion   LAPAROSCOPIC APPENDECTOMY     LUMBAR LAMINECTOMY  07/2008   [place]   TUBAL LIGATION      Assessment / Plan / Recommendation Clinical Impression  HPI: [name] is a 80 year old right-handed female with history of memory loss maintained on Namenda/Aricept, diabetes mellitus, hypertension, CKD stage III, ACDF 12/22/2014, quit smoking 25 years ago.  Per chart review  patient lives with spouse.  1 level home 2 steps to entry.  Patient was using a rolling walker prior to admission but needing prompting as well as noted falls.  Husband assist with ADLs. Presented 02/02/2021 with recurrent falls.  Spouse does have a part-time job and family provide supervision when he is out.  She was found to have an abrasion on the back of her head that did require 5 staples in the ED cranial CT scan showed a left occipital scalp 12 mm hematoma with no underlying calvarial fracture as well as 8 mm right parafalcine subdural hemorrhage extending inferiorly along the right tentorium.  CT cervical spine negative. She is currently on a dysphagia #2 thin liquid diet.Therapy evaluations completed due to patient decreased functional mobility cognitive deficits was admitted for a comprehensive rehab program.    Pt's daughter was present this date and contributed to hx. Pt presents with severe cognitive-linguistic deficits attributed to baseline dementia and exhibits a functional decline since hospitalization. Pt displayed confusion, disorientation x4, diminished focused attention, impaired immediate and short-term recall. Comprehension appeared affected by attention, processing speed, and working memory. This impacted response to basic biographical and environmental questions, and following basic 1-step commands. Functional verbal expression characterized by impaired thought organization, language of confusion, and word finding difficulty which was apparently present at baseline but not to this extent. Pt unable to recall her own name and daughter's name but successful when given field of 2 choices. Pt consistently asked "where am I?" And exhibited minimal-to-no retention of novel information following 30 second delay. Daughter endorsed pt is much more confused, disoriented, and unsettled since transfer to CIR. Pt lives at home with  spouse and received intermittent supervision from family. Pt required  assist with ADLs and is not responsible for iADLs at PLOF.   Pt exhibited difficulty executing oral motor movements 2/2 difficulty following commands and benefited from visual cues. Clinical BSE revealed mild oral phase deficits characterized by reduced yet functional mastication and mildly prolonged AP transit with Dys 3 textures, adequate oral clearance, and no overt s/sx of aspiration with Dys 3 solids and thin liquid single/sequential cup sips. Recommend diet advancement to Dys 3 and thin liquids. Cont pills crushed in puree. Continue to follow to advance diet as appropriate.  Patient would benefit from skilled SLP intervention to maximize cognitive-linguistic deficits and functional participation in least restrictive environment prior to discharge. Daughter verbalized agreement and understanding.   Skilled Therapeutic Interventions          SLP completed informal cognitive-linguistic assessments and clinical BSE. Educated daughter on results and POC. Daughter verbalized understanding and agreement to cont speech therapy services.  SLP Assessment  Patient will need skilled Speech Lanaguage Pathology Services during CIR admission    Recommendations  SLP Diet Recommendations: Dysphagia 3 (Mech soft);Thin Liquid Administration via: Straw;Cup Medication Administration: Crushed with puree Supervision: Full supervision/cueing for compensatory strategies Compensations: Minimize environmental distractions Postural Changes and/or Swallow Maneuvers: Seated upright 90 degrees;Upright 30-60 min after meal Oral Care Recommendations: Oral care BID Patient destination: Home Follow up Recommendations: 24 hour supervision/assistance;Home Health SLP Equipment Recommended: None recommended by SLP    SLP Frequency 3 to 5 out of 7 days   SLP Duration  SLP Intensity  SLP Treatment/Interventions 18-21 days  Minumum of 1-2 x/day, 30 to 90 minutes  Environmental controls;Internal/external aids;Cognitive  remediation/compensation;Cueing hierarchy;Dysphagia/aspiration precaution training;Functional tasks;Patient/family education    Pain No pain behaviors displayed.  Prior Functioning Cognitive/Linguistic Baseline: Baseline deficits Baseline deficit details: dementia, memory impairment Type of Home: House  Lives With: Spouse Available Help at Discharge: Family;Available 24 hours/day  SLP Evaluation Cognition Overall Cognitive Status: History of cognitive impairments - at baseline Arousal/Alertness: Awake/alert Orientation Level: Disoriented X4 Year:  (pt did not answer) Month: January Day of Week: Incorrect Attention: Focused Focused Attention: Impaired Focused Attention Impairment: Verbal basic;Functional basic Memory: Impaired Memory Impairment: Storage deficit;Retrieval deficit;Decreased recall of new information;Decreased short term memory Decreased Short Term Memory: Functional basic;Verbal basic Immediate Memory Recall:  (pt could not recall any words) Awareness: Impaired Awareness Impairment: Intellectual impairment Problem Solving: Impaired Problem Solving Impairment: Verbal basic;Functional basic Executive Function: Initiating;Sequencing;Organizing;Self Monitoring;Decision Making Reasoning: Impaired Reasoning Impairment: Verbal basic;Functional basic Sequencing: Impaired Sequencing Impairment: Verbal basic;Functional basic Organizing: Impaired Organizing Impairment: Verbal basic;Functional basic Decision Making: Impaired Decision Making Impairment: Verbal basic;Functional basic Initiating: Impaired Initiating Impairment: Verbal basic;Functional basic Behaviors: Perseveration;Restless Safety/Judgment: Impaired Comments: oriented to person only [employer] Scales of Cognitive Functioning: Confused/appropriate  Comprehension Auditory Comprehension Overall Auditory Comprehension: Impaired Yes/No Questions: Impaired Basic Biographical Questions: 26-50%  accurate Basic Immediate Environment Questions: 0-24% accurate Complex Questions: 0-24% accurate Interfering Components: Attention;Processing speed;Working Field seismologist: Repetition Expression Expression Primary Mode of Expression: Verbal Verbal Expression Overall Verbal Expression: Impaired Initiation: No impairment Automatic Speech: Name;Social Response Level of Generative/Spontaneous Verbalization: Word Repetition: Impaired Level of Impairment: Phrase level Naming: Impairment Confrontation: Impaired Oral Motor Oral Motor/Sensory Function Overall Oral Motor/Sensory Function: Within functional limits Motor Speech Overall Motor Speech: Appears within functional limits for tasks assessed Resonance: Within functional limits Articulation: Within functional limitis Intelligibility: Intelligible Motor Planning: Witnin functional limits  Care Tool Care Tool Cognition Ability to hear (with hearing aid or hearing  appliances if normally used Ability to hear (with hearing aid or hearing appliances if normally used): 1. Minimal difficulty - difficulty in some environments (e.g. when person speaks softly or setting is noisy)   Expression of Ideas and Wants Expression of Ideas and Wants: 2. Frequent difficulty - frequently exhibits difficulty with expressing needs and ideas   Understanding Verbal and Non-Verbal Content Understanding Verbal and Non-Verbal Content: 2. Sometimes understands - understands only basic conversations or simple, direct phrases. Frequently requires cues to understand  Memory/Recall Ability Memory/Recall Ability : None of the above were recalled   Intelligibility: Intelligible  Bedside Swallowing Assessment General Date of Onset: 02/02/21 Previous Swallow Assessment: 02/03/21 Diet Prior to this Study: Dysphagia 2 (chopped);Thin liquids Temperature Spikes Noted: No Respiratory Status: Room air History of Recent Intubation: No Behavior/Cognition:  Alert;Confused;Distractible Oral Cavity - Dentition: Adequate natural dentition Self-Feeding Abilities: Needs assist Vision: Functional for self-feeding Patient Positioning: Upright in chair/Tumbleform Volitional Cough: Cognitively unable to elicit  Oral Care Assessment Does patient have any of the following "high(er) risk" factors?: None of the above Does patient have any of the following "at risk" factors?: None of the above Patient is LOW RISK: Follow universal precautions (see row information) Thin Liquid Thin Liquid: Within functional limits Presentation: Cup;Straw Puree Puree: Within functional limits Presentation: Self Fed Solid Solid: Impaired Oral Phase Functional Implications: Impaired mastication;Prolonged oral transit BSE Assessment Risk for Aspiration Impact on safety and function: Mild aspiration risk Other Related Risk Factors: Cognitive impairment  Short Term Goals: Week 1: SLP Short Term Goal 1 (Week 1): Patient will follow simple one step-directions utilizing compensatory strategies with max A multimodal cues SLP Short Term Goal 2 (Week 1): Patient will express wants/needs when presented with two verbal options with mod A verbal cues SLP Short Term Goal 3 (Week 1): Patient will attend to basic famliar tasks for 5 minute intervals with max A verbal cues for redirection SLP Short Term Goal 4 (Week 1): Patient will locate appropriate information on external aids to improve orientation to place/situation with max A multimodal cues  Refer to Care Plan for Long Term Goals  Recommendations for other services: None   Discharge Criteria: Patient will be discharged from SLP if patient refuses treatment 3 consecutive times without medical reason, if treatment goals not met, if there is a change in medical status, if patient makes no progress towards goals or if patient is discharged from hospital.  The above assessment, treatment plan, treatment alternatives and goals were  discussed and mutually agreed upon: by family  Patty Sermons 02/11/2021, 4:59 PM

## 2021-02-11 NOTE — Evaluation (Signed)
Physical Therapy Assessment and Plan  Patient Details  Name: [name] MRN: [id number] Date of Birth: 1941-04-20  PT Diagnosis: Abnormal posture, Abnormality of gait, Cognitive deficits, Coordination disorder, Difficulty walking, Impaired cognition, Impaired sensation, and Muscle spasms Rehab Potential: Good ELOS: 18-21 days   Today's Date: 02/11/2021 PT Individual Time: 1350-1450 PT Individual Time Calculation (min): 60 min    Hospital Problem: Principal Problem:   Traumatic subdural hematoma ([name]) Active Problems:   Transaminitis   Past Medical History:  Past Medical History:  Diagnosis Date   Contact dermatitis and other eczema, due to unspecified cause    Displacement of lumbar intervertebral disc without myelopathy    FH: cataracts    Gout, unspecified    Memory loss    Occlusion and stenosis of carotid artery without mention of cerebral infarction    Other and unspecified hyperlipidemia    Pain in joint, shoulder region    Pain in limb    Routine general medical examination at a health care facility    Sciatica    Type II or unspecified type diabetes mellitus without mention of complication, uncontrolled    Unspecified essential hypertension    Unspecified venous (peripheral) insufficiency    Past Surgical History:  Past Surgical History:  Procedure Laterality Date   ANTERIOR CERVICAL DECOMP/DISCECTOMY FUSION N/A 12/22/2014   Procedure: Anterior Cervical Four-Five/Five-Six/Six-Seven Decompression/Diskectomy/ and Fsion;  Surgeon: Cynthia Cha, MD;  Location: [facility];  Service: Neurosurgery;  Laterality: N/A;  C4-5 C5-6 C6-7 Anterior cervical decompression/diskectomy/fusion   LAPAROSCOPIC APPENDECTOMY     LUMBAR LAMINECTOMY  07/2008   [place]   TUBAL LIGATION      Assessment & Plan Clinical Impression: Patient is a 80 year old right-handed female with history of memory loss maintained on Namenda/Aricept, diabetes mellitus, hypertension, CKD stage III, ACDF  12/22/2014, quit smoking 25 years ago.  Per chart review patient lives with spouse.  1 level home 2 steps to entry.  Patient was using a rolling walker prior to admission but needing prompting as well as noted falls.  Husband assist with ADLs.  Presented 02/02/2021 with recurrent falls.  Spouse does have a part-time job and family provide supervision when he is out.  She was found to have an abrasion on the back of her head that did require 5 staples in the ED cranial CT scan showed a left occipital scalp 12 mm hematoma with no underlying calvarial fracture as well as 8 mm right parafalcine subdural hemorrhage extending inferiorly along the right tentorium.  CT cervical spine negative.  Admission chemistries unremarkable except glucose 154, creatinine 1.03, hemoglobin 10.5, urine culture greater 100,000 Klebsiella and placed on Keflex completing course of antibiotic.  Follow-up neurosurgery Dr. Duffy Robbins in regards to traumatic SDH advised conservative care with latest cranial CT scan showing no new or enlarging intracranial hemorrhage.  Patient was on low-dose aspirin 81 mg daily prior to admission advised to hold x1 week.  Bouts of orthostasis HCTZ was held.  Findings of elevated LFTs 02/07/2021 and monitored with ultrasound right upper quad showing an 8 mm GB polyp.  Her hep C antibody was positive.  Patient asymptomatic and reviewed with gastroenterology Dr. Carlean Robbins and felt elevated LFTs related to recent antibiotic therapy for UTI although alk phos appears to been elevated since March 2021.. .  Consideration for HIDA scan if remains elevated..  Patient's LFTs continue trending downward with improvement.  She is currently on a dysphagia #2 thin liquid diet.  Therapy evaluations completed due to  patient decreased functional mobility cognitive deficits was admitted for a comprehensive rehab program.    Patient transferred to CIR on 02/10/2021 .   Patient currently requires max with mobility secondary to muscle  weakness and muscle joint tightness, decreased cardiorespiratoy endurance, motor apraxia and decreased motor planning, decreased visual acuity and decreased visual perceptual skills, decreased attention to right, decreased initiation, decreased attention, decreased awareness, decreased problem solving, decreased safety awareness, decreased memory, and delayed processing, and decreased sitting balance, decreased standing balance, decreased postural control, and decreased balance strategies.  Prior to hospitalization, patient was modified independent  with mobility and lived with Significant other in a House home.  Home access is 2Stairs to enter.  Patient will benefit from skilled PT intervention to maximize safe functional mobility, minimize fall risk, and decrease caregiver burden for planned discharge home with 24 hour assist.  Anticipate patient will benefit from follow up [facility] at discharge.  PT - End of Session Activity Tolerance: Tolerates < 10 min activity, no significant change in vital signs Endurance Deficit: Yes PT Assessment Rehab Potential (ACUTE/IP ONLY): Good PT Barriers to Discharge: [place] home environment;Home environment access/layout;Behavior;Decreased caregiver support;Lack of/limited family support;Medication compliance;Incontinence PT Patient demonstrates impairments in the following area(s): Balance;Behavior;Edema;Endurance;Motor;Nutrition;Pain;Perception;Safety;Skin Integrity;Sensory PT Transfers Functional Problem(s): Bed Mobility;Bed to Chair;Car;Furniture;Floor PT Locomotion Functional Problem(s): Ambulation;Wheelchair Mobility;Stairs PT Plan PT Intensity: Minimum of 1-2 x/day ,45 to 90 minutes PT Frequency: 5 out of 7 days PT Duration Estimated Length of Stay: 18-21 days PT Treatment/Interventions: Ambulation/gait training;Balance/vestibular training;Cognitive remediation/compensation;Disease management/prevention;Discharge planning;Community  reintegration;DME/adaptive equipment instruction;Functional electrical stimulation;Functional mobility training;Patient/family education;Pain management;Neuromuscular re-education;Psychosocial support;Skin care/wound management;Splinting/orthotics;Therapeutic Exercise;Therapeutic Activities;Stair training;UE/LE Strength taining/ROM;UE/LE Coordination activities;Visual/perceptual remediation/compensation;Wheelchair propulsion/positioning PT Transfers Anticipated Outcome(s): CGA with LRAD PT Locomotion Anticipated Outcome(s): ambulatory with LRAD and CGA PT Recommendation Follow Up Recommendations: Home health PT Patient destination: Home Equipment Recommended: To be determined;Rolling walker with 5" wheels;Wheelchair (measurements);Wheelchair cushion (measurements)   PT Evaluation Precautions/Restrictions Precautions Precautions: Fall Precaution Comments: Multiple recent falls. Restrictions Weight Bearing Restrictions: No General PT Amount of Missed Time (min): 15 Minutes PT Missed Treatment Reason: Increased agitation;Patient fatigue Vital SignsTherapy Vitals Temp: 98 F (36.7 C) Pulse Rate: (!) 58 Resp: 15 BP: (!) 143/60 Patient Position (if appropriate): Sitting Oxygen Therapy SpO2: 97 % O2 Device: Room Air Pain   Pain Interference Pain Interference Pain Effect on Sleep: 8. Unable to answer Pain Interference with Therapy Activities: 8. Unable to answer Pain Interference with Day-to-Day Activities: 8. Unable to answer Home Living/Prior Functioning Home Living Living Arrangements: Spouse/significant other Available Help at Discharge: Family;Available 24 hours/day Type of Home: House Home Access: Stairs to enter [employer] of Steps: 2 Entrance Stairs-Rails: Left Home Layout: One level Bathroom Shower/Tub: Government social research officer Accessibility: No Additional Comments: pulled from previous encounter - pt unable to answer, need to  verify with family (pt unable to answer - need to verify with family)  Lives With: Significant other Prior Function Level of Independence: Needs assistance with ADLs;Needs assistance with homemaking;Requires assistive device for independence Vision/Perception  Vision - History Ability to See in Adequate Light: 1 Impaired Perception Perception: Within Functional Limits Praxis Praxis: Impaired Praxis Impairment Details: Ideation;Initiation  Cognition Overall Cognitive Status: History of cognitive impairments - at baseline Arousal/Alertness: Awake/alert Orientation Level: Oriented to person;Disoriented to place;Disoriented to time;Disoriented to situation Attention: Focused Focused Attention: Impaired Focused Attention Impairment: Verbal basic;Functional basic Memory: Impaired Memory Impairment: Retrieval deficit;Decreased recall of new information;Decreased long term memory;Decreased short term memory Decreased Short Term Memory: Functional basic;Verbal basic  Immediate Memory Recall:  (pt could not recall any words) Memory Recall Sock: Not able to recall Memory Recall Blue: Not able to recall Memory Recall Bed: Not able to recall Awareness: Impaired Awareness Impairment: Intellectual impairment;Emergent impairment;Anticipatory impairment Problem Solving: Impaired Problem Solving Impairment: Verbal basic;Functional basic Executive Function: Decision Making;Organizing;Sequencing;Reasoning;Initiating Reasoning: Impaired Reasoning Impairment: Verbal basic;Functional basic Sequencing: Impaired Sequencing Impairment: Verbal basic;Functional basic Organizing: Impaired Organizing Impairment: Verbal basic;Functional basic Decision Making: Impaired Decision Making Impairment: Functional basic;Verbal basic Initiating: Impaired Initiating Impairment: Functional basic;Verbal basic Behaviors: Perseveration Safety/Judgment: Impaired Comments: oriented to person only [employer]  Scales of Cognitive Functioning: Confused/appropriate Sensation Sensation Light Touch: Appears Intact Hot/Cold: Appears Intact Proprioception: Impaired by gross assessment Stereognosis: Not tested Coordination Gross Motor Movements are Fluid and Coordinated: No Fine Motor Movements are Fluid and Coordinated: No Coordination and Movement Description: ridged movements at time and mild inattention to the RUE Finger Nose Finger Test: impaired - pt difficulty command following and movements slow Heel Shin Test: unable to follow commands Motor  Motor Motor: Abnormal postural alignment and control;Motor perseverations;Motor impersistence;Motor apraxia Motor - Skilled Clinical Observations: ridged movements as well as significant perseverations with gait pattern   Trunk/Postural Assessment  Cervical Assessment Cervical Assessment: Exceptions to [facility] Thoracic Assessment Thoracic Assessment: Exceptions to [facility] (rounded shoulders) Lumbar Assessment Lumbar Assessment: Exceptions to WFL (decreased lordosis) Postural Control Postural Control: Deficits on evaluation Protective Responses: delayed  Balance Balance Balance Assessed: Yes Static Sitting Balance Static Sitting - Balance Support: No upper extremity supported Static Sitting - Level of Assistance: 5: Stand by assistance Dynamic Sitting Balance Dynamic Sitting - Balance Support: No upper extremity supported;During functional activity Dynamic Sitting - Level of Assistance: 4: Min Insurance risk surveyor Standing - Balance Support: Bilateral upper extremity supported Static Standing - Level of Assistance: 4: Min assist Dynamic Standing Balance Dynamic Standing - Balance Support: Bilateral upper extremity supported;During functional activity Dynamic Standing - Level of Assistance: 3: Mod assist Extremity Assessment  RUE Assessment RUE Assessment: Exceptions to [facility] Active Range of Motion (AROM) Comments: shoulder flex 95  deg LUE Assessment LUE Assessment: Exceptions to [facility] Active Range of Motion (AROM) Comments:  (shoulder flex 95 deg) RLE Assessment RLE Assessment: Exceptions to [facility] General Strength Comments: at least 4-/5 proximal to distal functionally. unable to perform formal MMT due to difficulties in command following LLE Assessment LLE Assessment: Exceptions to [facility] General Strength Comments: at least 4-/5 proximal to distal functionally. unable to perform formal MMT due to difficulties in command following  Care Tool Care Tool Bed Mobility Roll left and right activity   Roll left and right assist level: Maximal Assistance - Patient 25 - 49%    Sit to lying activity   Sit to lying assist level: Maximal Assistance - Patient 25 - 49%    Lying to sitting on side of bed activity   Lying to sitting on side of bed assist level: the ability to move from lying on the back to sitting on the side of the bed with no back support.: Moderate Assistance - Patient 50 - 74%     Care Tool Transfers Sit to stand transfer   Sit to stand assist level: Moderate Assistance - Patient 50 - 74%    Chair/bed transfer   Chair/bed transfer assist level: Moderate Assistance - Patient 50 - 74%     Physiological scientist transfer assist level: Maximal Assistance - Patient 25 -  49%      Care Tool Locomotion Ambulation   Assist level: Maximal Assistance - Patient 25 - 49% Assistive device: Hand held assist Max distance: 5 ft  Walk 10 feet activity Walk 10 feet activity did not occur: Safety/medical concerns       Walk 50 feet with 2 turns activity Walk 50 feet with 2 turns activity did not occur: Safety/medical concerns      Walk 150 feet activity Walk 150 feet activity did not occur: Safety/medical concerns      Walk 10 feet on uneven surfaces activity Walk 10 feet on uneven surfaces activity did not occur: Safety/medical concerns      Stairs Stair activity did not occur:  Safety/medical concerns        Walk up/down 1 step activity Walk up/down 1 step or curb (drop down) activity did not occur: Safety/medical concerns     Walk up/down 4 steps activity did not occuR: Safety/medical concerns  Walk up/down 4 steps activity      Walk up/down 12 steps activity Walk up/down 12 steps activity did not occur: Safety/medical concerns      Pick up small objects from floor Pick up small object from the floor (from standing position) activity did not occur: Safety/medical concerns      Wheelchair Is the patient using a wheelchair?: Yes Type of Wheelchair: Manual Wheelchair activity did not occur: Refused      Wheel 50 feet with 2 turns activity      Wheel 150 feet activity Wheelchair 150 feet activity did not occur: Refused      Refer to Care Plan for Long Term Goals  SHORT TERM GOAL WEEK 1 PT Short Term Goal 1 (Week 1): Pt will remain out of bed > 2 hours between therapies PT Short Term Goal 2 (Week 1): Pt will perform bed mobility with min assist PT Short Term Goal 3 (Week 1): Pt will ambulate 132ft with min assist and LRAD PT Short Term Goal 4 (Week 1): Pt will be able to attend to task for >5 minutes without redirection  Recommendations for other services: None   Skilled Therapeutic Intervention  Pt received supine in bed and agreeable to PT. Supine>sit transfer with max assist as listed. PT instructed patient in PT Evaluation and initiated treatment intervention; see above for results. PT educated patient in [place], rehab potential, rehab goals, and discharge recommendations along with recommendation for follow-up rehabilitation services. Gait training with HHA in room x 5 ft with max assist due to mild R LE weakness. Gait training with RW x 68ft with min-mod assist as listed with severe preservations and shuffle patttern. Stair management training with UE support required. Unable to perform without UE support. Car transfer rtraining with Mod assist overall  and max cues for attention to task. Pt noted to have multiple bouts of increased agitaiton but able to be redirected with verbal technique without increased escalation. Pt returned to room and performed stand pivot transfer to bed with mod assist and RW. Sit>supine completed with mod assist, and left supine in bed with call bell in reach and all needs met.      Mobility Bed Mobility Bed Mobility: Supine to Sit;Sitting - Scoot to Edge of Bed;Sit to Supine Rolling Right: Maximal Assistance - Patient 25-49% Supine to Sit: Maximal Assistance - Patient - Patient 25-49% Sitting - Scoot to Edge of Bed: Moderate Assistance - Patient 50-74% Sit to Supine: Moderate Assistance - Patient 50-74% Transfers Transfers: Stand Pivot Transfers;Stand  to Sit;Sit to Stand Sit to Stand: Moderate Assistance - Patient 50-74% Stand to Sit: Moderate Assistance - Patient 50-74% Stand Pivot Transfers: Moderate Assistance - Patient 50 - 74% Stand Pivot Transfer Details: Verbal cues for gait pattern;Verbal cues for safe use of DME/AE;Manual facilitation for weight shifting Transfer (Assistive device): Rolling walker Locomotion  Gait Ambulation: Yes Gait Assistance: Minimal Assistance - Patient > 75%;Moderate Assistance - Patient 50-74% Gait Distance (Feet): 55 Feet Assistive device: Rolling walker Gait Assistance Details: Verbal cues for gait pattern;Verbal cues for sequencing;Verbal cues for technique;Verbal cues for precautions/safety;Verbal cues for safe use of DME/AE;Manual facilitation for weight shifting Gait Gait: Yes Gait Pattern: Impaired Gait Pattern: Shuffle;Festinating;Narrow base of support Stairs / Additional Locomotion Stairs: Yes Stairs Assistance: Minimal Assistance - Patient > 75% Stair Management Technique: Two rails Number of Stairs: 8 Height of Stairs: 3 Wheelchair Mobility Wheelchair Mobility: Yes Wheelchair Assistance: Dependent - Patient 0% (unable to initiate with  instruction) Wheelchair Parts Management: Needs assistance Distance: 150   Discharge Criteria: Patient will be discharged from PT if patient refuses treatment 3 consecutive times without medical reason, if treatment goals not met, if there is a change in medical status, if patient makes no progress towards goals or if patient is discharged from hospital.  The above assessment, treatment plan, treatment alternatives and goals were discussed and mutually agreed upon: by patient  Lorie Phenix 02/11/2021, 3:08 PM

## 2021-02-11 NOTE — Evaluation (Addendum)
Occupational Therapy Assessment and Plan  Patient Details  Name: [name] MRN: [id number] Date of Birth: 05/28/1940  OT Diagnosis: abnormal posture, altered mental status, cognitive deficits, and muscle weakness (generalized) Rehab Potential: Rehab Potential (ACUTE ONLY): Fair ELOS:   18-21   Today's Date: 02/11/2021 OT Individual Time: 0800-0900 OT Individual Time Calculation (min): 60 min     Hospital Problem: Principal Problem:   Traumatic subdural hematoma ([place])   Past Medical History:  Past Medical History:  Diagnosis Date   Contact dermatitis and other eczema, due to unspecified cause    Displacement of lumbar intervertebral disc without myelopathy    FH: cataracts    Gout, unspecified    Memory loss    Occlusion and stenosis of carotid artery without mention of cerebral infarction    Other and unspecified hyperlipidemia    Pain in joint, shoulder region    Pain in limb    Routine general medical examination at a health care facility    Sciatica    Type II or unspecified type diabetes mellitus without mention of complication, uncontrolled    Unspecified essential hypertension    Unspecified venous (peripheral) insufficiency    Past Surgical History:  Past Surgical History:  Procedure Laterality Date   ANTERIOR CERVICAL DECOMP/DISCECTOMY FUSION N/A 12/22/2014   Procedure: Anterior Cervical Four-Five/Five-Six/Six-Seven Decompression/Diskectomy/ and Fsion;  Surgeon: Leeroy Cha, MD;  Location: [facility];  Service: Neurosurgery;  Laterality: N/A;  C4-5 C5-6 C6-7 Anterior cervical decompression/diskectomy/fusion   LAPAROSCOPIC APPENDECTOMY     LUMBAR LAMINECTOMY  07/2008   [place]   TUBAL LIGATION      Assessment & Plan Clinical Impression: Patient is a 80 y.o. year old female with recent admission to the hospital on 9/14 with SDH.  Patient transferred to CIR on 02/10/2021 .  Pt is a 80yo female presenting to [facility] ED on 9/14 after a fall where she hit her head. CT  revealed subdural hematoma on the R. She came to [facility] ED on 9/13 after a different fall and was discharged. PMH: HTN, DM, dementia, history of falling, cervical spine fusion C4-C7.   Patient currently requires mod with basic self-care skills and IADL secondary to muscle weakness, decreased cardiorespiratoy endurance, impaired timing and sequencing, decreased coordination, and decreased motor planning, decreased motor planning, decreased initiation, decreased attention, decreased awareness, decreased problem solving, decreased safety awareness, decreased memory, and delayed processing, and decreased sitting balance, decreased standing balance, decreased postural control, and decreased balance strategies.  Prior to hospitalization, patient could complete BADLs with supervision.  Patient will benefit from skilled intervention to decrease level of assist with basic self-care skills, increase independence with basic self-care skills, and increase level of independence with iADL prior to discharge home with care partner.  Anticipate patient will require 24 hour supervision and follow up home health.  OT - End of Session Activity Tolerance: Tolerates < 10 min activity, no significant change in vital signs Endurance Deficit: Yes OT Assessment Rehab Potential (ACUTE ONLY): Fair OT Barriers to Discharge:  (cognition) OT Plan OT Intensity: Minimum of 1-2 x/day, 45 to 90 minutes OT Frequency: 5 out of 7 days OT Duration/Estimated Length of Stay: 18-21 days OT Goals: S-MIN A  OT Treatment/Interventions: Balance/vestibular training;Cognitive remediation/compensation;Discharge planning;Disease mangement/prevention;DME/adaptive equipment instruction;Functional mobility training;Pain management;Patient/family education;Psychosocial support;Self Care/advanced ADL retraining;Therapeutic Activities;Therapeutic Exercise;UE/LE Coordination activities;Visual/perceptual remediation/compensation OT Recommendation Patient  destination: Home Follow Up Recommendations: Home health OT Equipment Recommended: To be determined   OT Evaluation Precautions/Restrictions  Precautions Precautions: Fall Precaution  Comments: Multiple recent falls.  Home Living/Prior Functioning Home Living Living Arrangements: Spouse/significant other Available Help at Discharge: Family, Available 24 hours/day Type of Home: House Home Access: Stairs to enter [employer] of Steps: 2 Entrance Stairs-Rails: Left Home Layout: One level Bathroom Shower/Tub: Government social research officer Accessibility: No Additional Comments: pulled from previous encounter - pt unable to answer, need to verify with family (pt unable to answer - need to verify with family)  Lives With: Significant other Prior Function Level of Independence: Needs assistance with ADLs, Needs assistance with homemaking, Requires assistive device for independence Vision Ability to See in Adequate Light: 1 Impaired Vision Assessment?: Vision impaired- to be further tested in functional context Perception  Perception: Not tested Praxis Praxis: Not tested Cognition Overall Cognitive Status: History of cognitive impairments - at baseline Arousal/Alertness: Awake/alert Orientation Level: Person Memory: Impaired Memory Impairment: Retrieval deficit;Decreased recall of new information;Decreased long term memory;Decreased short term memory Decreased Short Term Memory: Functional basic;Verbal basic Immediate Memory Recall:  (pt could not recall any words) Memory Recall Sock: Not able to recall Memory Recall Blue: Not able to recall Memory Recall Bed: Not able to recall Attention: Focused Focused Attention: Impaired Focused Attention Impairment: Verbal basic;Functional basic Awareness: Impaired Awareness Impairment: Intellectual impairment;Emergent impairment;Anticipatory impairment Problem Solving: Impaired Problem Solving Impairment:  Verbal basic;Functional basic Executive Function: Decision Making;Organizing;Sequencing;Reasoning;Initiating Reasoning: Impaired Reasoning Impairment: Verbal basic;Functional basic Sequencing: Impaired Sequencing Impairment: Verbal basic;Functional basic Organizing: Impaired Organizing Impairment: Verbal basic;Functional basic Decision Making: Impaired Decision Making Impairment: Functional basic;Verbal basic Initiating: Impaired Initiating Impairment: Functional basic;Verbal basic Behaviors: Perseveration Safety/Judgment: Impaired [employer] Scales of Cognitive Functioning: Confused/appropriate Sensation Sensation Light Touch: Appears Intact Hot/Cold: Appears Intact Proprioception: Not tested Stereognosis: Not tested Coordination Gross Motor Movements are Fluid and Coordinated: No Fine Motor Movements are Fluid and Coordinated: No Finger Nose Finger Test: impaired - pt difficulty command following and movements slow Motor  Motor Motor: Abnormal postural alignment and control Motor - Skilled Clinical Observations: increased time needed for tasks  Trunk/Postural Assessment  Cervical Assessment Cervical Assessment: Exceptions to [facility] Thoracic Assessment Thoracic Assessment:  (forward head)  Balance Balance Balance Assessed: Yes Dynamic Sitting Balance Dynamic Sitting - Level of Assistance: 4: Min assist Dynamic Standing Balance Dynamic Standing - Level of Assistance: 3: Mod assist Extremity/Trunk Assessment RUE Assessment RUE Assessment: Exceptions to [facility] Active Range of Motion (AROM) Comments: shoulder flex 95 deg LUE Assessment LUE Assessment: Exceptions to [facility] Active Range of Motion (AROM) Comments:  (shoulder flex 95 deg)  Care Tool Care Tool Self Care Eating   Eating Assist Level: Set up assist    Oral Care         Bathing   Body parts bathed by patient: Right arm;Left arm;Chest;Abdomen;Face;Left lower leg;Right lower leg;Left upper leg;Right upper  leg   Body parts n/a: Buttocks;Front perineal area Assist Level: Moderate Assistance - Patient 50 - 74%    Upper Body Dressing(including orthotics)   What is the patient wearing?: Pull over shirt   Assist Level: Maximal Assistance - Patient 25 - 49%    Lower Body Dressing (excluding footwear)   What is the patient wearing?: Pants;Incontinence brief Assist for lower body dressing: Total Assistance - Patient < 25%    Putting on/Taking off footwear   What is the patient wearing?: Non-skid slipper socks Assist for footwear: Maximal Assistance - Patient 25 - 49%       Care Tool Toileting Toileting activity   Assist for toileting: Maximal Assistance -  Patient 25 - 49%     Care Tool Bed Mobility Roll left and right activity        Sit to lying activity   Sit to lying assist level: Moderate Assistance - Patient 50 - 74%    Lying to sitting on side of bed activity   Lying to sitting on side of bed assist level: the ability to move from lying on the back to sitting on the side of the bed with no back support.: Moderate Assistance - Patient 50 - 74%     Care Tool Transfers Sit to stand transfer   Sit to stand assist level: Moderate Assistance - Patient 50 - 74%    Chair/bed transfer         Toilet transfer   Assist Level: Moderate Assistance - Patient 50 - 74%     Care Tool Cognition  Expression of Ideas and Wants Expression of Ideas and Wants: 2. Frequent difficulty - frequently exhibits difficulty with expressing needs and ideas  Understanding Verbal and Non-Verbal Content Understanding Verbal and Non-Verbal Content: 2. Sometimes understands - understands only basic conversations or simple, direct phrases. Frequently requires cues to understand   Memory/Recall Ability Memory/Recall Ability : None of the above were recalled   Refer to Care Plan for Long Term Goals  SHORT TERM GOAL WEEK 1 OT Short Term Goal 1 (Week 1): Pt will be able to don shirt with MIN A OT Short  Term Goal 2 (Week 1): Pt will complete sit > stand with LRAD and MIN A in prep for LB dressing OT Short Term Goal 3 (Week 1): Pt will complete UB bathing with no more than MIN cuing OT Short Term Goal 4 (Week 1): Pt will complete transfer to raised toilet with MOD A  Recommendations for other services: None    Skilled Therapeutic Intervention Pt recieved in bed eating breakfast, no c/o pain and presenting with disorientation to location. IV on floor when OT walked in - LPN made aware.  Slight swelling noted in BLE. Agreeable to OT. Pt continously disoriented to time, situation, and place. Constant gentle reorientation provided - easily redirectable. Pt currently requires MOD-MAX A for BADLs with RW for funtional mobility. OTS facilitated cuing for forward weight shift, hand placment with assistance for RW mgmt required. Bathing/dressing completed EOB - assist levels below. Need to confirm with family home setup and plans for d/c.   ADL ADL Eating: Set up Where Assessed-Eating: Chair Grooming: Moderate assistance;Maximal cueing Where Assessed-Grooming: Edge of bed Upper Body Bathing: Moderate assistance;Maximal cueing Where Assessed-Upper Body Bathing: Edge of bed Lower Body Bathing: Maximal cueing;Moderate assistance Where Assessed-Lower Body Bathing: Edge of bed Upper Body Dressing: Maximal assistance Where Assessed-Upper Body Dressing: Edge of bed Lower Body Dressing: Maximal assistance Where Assessed-Lower Body Dressing: Edge of bed Toileting: Maximal assistance Where Assessed-Toileting: Bedside Commode Toilet Transfer: Maximal assistance Toilet Transfer Method: Stand pivot Science writer: Bedside commode Tub/Shower Transfer: Maximal assistance Tub/Shower Transfer Method: Stand pivot Tub/Shower Equipment: Transfer tub bench Mobility  Bed Mobility Bed Mobility: Supine to Sit;Sitting - Scoot to Edge of Bed Supine to Sit: Moderate Assistance - Patient 50-74% Sitting -  Scoot to Edge of Bed: Moderate Assistance - Patient 50-74%   Discharge Criteria: Patient will be discharged from OT if patient refuses treatment 3 consecutive times without medical reason, if treatment goals not met, if there is a change in medical status, if patient makes no progress towards goals or if patient is discharged from hospital.  The above assessment, treatment plan, treatment alternatives and goals were discussed and mutually agreed upon: by patient  [facility] 02/11/2021, 12:45 PM

## 2021-02-12 DIAGNOSIS — R1312 Dysphagia, oropharyngeal phase: Secondary | ICD-10-CM

## 2021-02-12 DIAGNOSIS — E119 Type 2 diabetes mellitus without complications: Secondary | ICD-10-CM

## 2021-02-12 LAB — GLUCOSE, CAPILLARY: Glucose-Capillary: 169 mg/dL — ABNORMAL HIGH (ref 70–99)

## 2021-02-12 MED ORDER — BISACODYL 10 MG RE SUPP
10.0000 mg | Freq: Every day | RECTAL | Status: DC | PRN
  Administered 2021-02-12: 10 mg via RECTAL
  Filled 2021-02-12: qty 1

## 2021-02-12 MED ORDER — SENNOSIDES-DOCUSATE SODIUM 8.6-50 MG PO TABS
2.0000 | ORAL_TABLET | Freq: Every day | ORAL | Status: DC
  Administered 2021-02-12 – 2021-02-28 (×17): 2 via ORAL
  Filled 2021-02-12 (×16): qty 2

## 2021-02-12 MED ORDER — SODIUM CHLORIDE 0.45 % IV SOLN: INTRAVENOUS | Status: DC

## 2021-02-12 NOTE — Progress Notes (Signed)
Occupational Therapy TBI Note  Patient Details  Name: [name] MRN: [id number] Date of Birth: 04-Aug-1940  Today's Date: 02/12/2021 OT Individual Time: 0379-4446 OT Individual Time Calculation (min): 48 min    Short Term Goals: Week 1:  OT Short Term Goal 1 (Week 1): Pt will be able to don shirt with MIN A OT Short Term Goal 2 (Week 1): Pt will complete sit > stand with LRAD and MIN A in prep for LB dressing OT Short Term Goal 3 (Week 1): Pt will complete UB bathing with no more than MIN cuing OT Short Term Goal 4 (Week 1): Pt will complete transfer to raised toilet with MOD A  Skilled Therapeutic Interventions/Progress Updates:     Session 1: (1901-2224)  Pt in bed to start session with eyes closed.  She opened them to verbal stimuli but did not maintain throughout session with mod to max instructional cueing needed to sustain focused attention.  BP taken in supine at 132/53.  She was not oriented to place, time, or situation but was able to state her name with increased time when asked.  Max assist for transfer to the EOB from supine with pt moaning, but when asked if she was having pain, she would state "No".  She was able to maintain sitting with min guard assist with report of needing to "poop".  BP taken in sitting at 126/57.  Had her ambulate to the 3:1 over the toilet with mod assist using the RW.  Flexed head and posture with short step length noted.  Max assist to back up to the toilet and align herself up with the surface.  Total assist for completion of toilet clothing management and hygiene.  She sat on the toilet with decreased attention overall, continually trying to prop her arms on the walker.  Had her transfer to the wheelchair with max assist for return to the bed secondary to limited attention and need for nursing to start IV.  Session 2: (1146-4314)  Pt with no pain reported during session.  She was much more alert this session compared to the am,but still not oriented to  place, time, or situation.  She was able to transfer to the EOB with min assist and then to the wheelchair at mod assist level.  She demonstrates increased trunk flexion with short step length noted during transfer.  Mod assist for directing the walker and turning all the way around to line up with the surface.  Took her over to the sink where she was able to use the comb to work on combing her hair with setup, but still demonstrates  matted area of hair in the back next to her incision.  Next,therapist pushed her down to the dayroom where she engaged in FM coordination task at the high/low table.  She was instructed to place the pegs in the [employer] board first with the right hand and then with the left hand.  Increased time needed to complete simple task secondary to decreased sustained attention.  She needed 3 mins for completion of placing the pegs with each hand.   Therapist had to provide mod demonstrational cueing for direction as she would try to use the opposite hand or try to pick up the peg from the container and place it back into it,  instead of placing it into the peg hole.  Returned to the room after completion with transfer to the recliner at [facility] assist level.  Therapy Documentation Precautions:  Precautions Precautions: Fall Precaution Comments: lethargic, confused Restrictions Weight Bearing Restrictions: No    Pain:   Agitated Behavior Scale: TBI Observation Details Observation Environment: pts room Start of observation period - Date: 02/12/21 Start of observation period - Time: 1006 End of observation period - Date: 02/12/21 End of observation period - Time: 1047 Agitated Behavior Scale (DO NOT LEAVE BLANKS) Short attention span, easy distractibility, inability to concentrate: Present to a moderate degree Impulsive, impatient, low tolerance for pain or frustration: Absent Uncooperative, resistant to care, demanding: Absent Violent and/or threatening violence  toward people or property: Absent Explosive and/or unpredictable anger: Absent Rocking, rubbing, moaning, or other self-stimulating behavior: Present to an extreme degree Pulling at tubes, restraints, etc.: Absent Wandering from treatment areas: Absent Restlessness, pacing, excessive movement: Absent Repetitive behaviors, motor, and/or verbal: Present to an extreme degree Rapid, loud, or excessive talking: Absent Sudden changes of mood: Absent Easily initiated or excessive crying and/or laughter: Absent Self-abusiveness, physical and/or verbal: Absent Agitated behavior scale total score: 22  ADL: See Care Tool Section for some details   Therapy/Group: Individual Therapy  Jansel Vonstein OTR/L 02/12/2021, 4:42 PM

## 2021-02-12 NOTE — Progress Notes (Signed)
Physical Therapy Session Note  Patient Details  Name: [name] MRN: [id number] Date of Birth: 10-08-1940  Today's Date: 02/12/2021 PT Individual Time: 0810-0908 and 3267-1245 PT Individual Time Calculation (min): 58 min  and 79 min  Short Term Goals: Week 1:  PT Short Term Goal 1 (Week 1): Pt will remain out of bed > 2 hours between therapies PT Short Term Goal 2 (Week 1): Pt will perform bed mobility with min assist PT Short Term Goal 3 (Week 1): Pt will ambulate 171ft with min assist and LRAD PT Short Term Goal 4 (Week 1): Pt will be able to attend to task for >5 minutes without redirection  Skilled Therapeutic Interventions/Progress Updates:    Session 1: Pt received supine in bed with HOB maximally elevated and nursing staff present providing total assist for pt to consume breakfast. Pt agreeable to therapy session therefore therapist assumed care of patient. Supine>sitting R EOB, HOB maximally elevated and using bedrails, with supervision and increased time/effort with pt making small, inefficient scoots to get hips towards EOB. Donned tennis shoes with pt noted to have swelling in feet and lower legs along with sensitivity to touch when putting on shoes.  Sitting EOB with supervision for trunk control and full supervision for meal - pt demonstrates slow B UE movements with impaired motor planning, impaired awareness, and impaired dual-task abilities - pt requires cuing to motor plan to let go of the spoon with RUE prior to reaching to complete another task with that hand, also has difficulty motor planning how to pick up a cup and drink from the straw (in addition to difficulty finding her mouth with the straw).   Once meal complete, pt agreeable to attempt toileting. Sitting EOB forward scooting of hips with contiued small, inefficient scoots requiring increased time to get in position for transfer. Sit>stand EOB>RW with min assist for lifting - despite cuing to push up with UEs from bed  pt quickly transitions her hands to the RW.  Gait training ~75ft to [facility] over toilet using RW with min assist for balance - pt with reports of pain in R foot with weightbearing (nursing staff made aware) - pt with very small, short step lengths starting with step-to leading with R LE as pt not tolerating standing on R LE long enough to take reciprocal step with L LE - cuing for upright posture and safe AD management as pt pushing it too far forward.  Standing with light min assist for balance while pt doffed LB clothing. Continently voided bladder on toilet and pt requesting additional time to attempt BM reporting difficulty passing the stool.   Pt suddenly had whole body jerk resulting in trunk and knee extension associated with a blank stare - pt did not respond verbally to therapist despite loud voice and firm tactile stimulus (pt still breathing and with a pulse) therefore nursing staff urgently notified and assisted therapist to total assist transfer pt BSC>W/C>EOB where pt was positioned in supine. For ~ 2 minutes, pt continued to have limited verbal response and would only open eyes briefly to loud verbal stimulus and firm tactile stimulus prior to quickly closing eyes back while vital signs and blood sugar were assessed to be WNL. Notified MD of concern for possible seizure vs vasovagal event. Pt left in care of nursing and still appearing lethargic at that time.    Session 2: Confirmed with nurse that pt appropriate for therapy. Pt received sitting in recliner and agreeable to therapy session.  Pt appears tired throughout session often resting eyes closed but able to arouse and maintain alertness with min verbal stimulus. Sit>stand recliner>RW with pt continuing to require increased time to process, motor plan, and execute movements with small, inefficient forward scoots towards front of seat followed by max cuing to push up from armrests as opposed to pulling up on RW with min assist to lift to  stand. Pt noted to be incontinent of bladder.  Gait training ~33ft into bathroom using RW with min assist for balance - demos significantly small step lengths lacking forward advancement of limbs with slow speed of movements - requires cuing for AD management when turning or navigating up bathroom threshold. Pt demonstrates impaired motor planning when turning to sit on BSC over toilet requiring max verbal, tactile, and visual cuing to step backwards towards the seat and pt unable to understand cues to doff pants at this time requiring max assist. Pt continently voids bladder. Standing with B UE support on RW or grab bar with light min assist for balance during total assist peri-care for cleanliness.   Gait training ~26ft out of bathroom using RW with min assist for balance and pt continuing to demo significantly small step lengths with very slow speed of movements resulting in limited forward propulsion during gait.  Transported to/from gym in w/c for time management and energy conservation.    Therapist provided pt with smaller wheelchair and cushion for improved patient fit and improved pressure relief. Pt performed 3x sit<>stands w/c<>RW with light min assist for lifting to stand focusing on proper UE placement when going to stand and improved initiation and motor planning of transfers.  Transported back to room. Short distance ~49ft ambulatory transfer w/c>recliner using RW with min assist for balance - pt again demos impaired motor planning when going to sit in recliner requiring max verbal, tactile, and visual cuing to turn AD and step backwards towards the seat. Pt left seated in recliner with needs in reach, seat belt alarm on, B LEs elevated, and needs in reach.   Therapy Documentation Precautions:  Precautions Precautions: Fall Precaution Comments: Multiple recent falls. Restrictions Weight Bearing Restrictions: No   Pain:  Session 1: Sensitivity to touch in bilateral feet when donning  shoes and increased pain/discomfort with weightbearing on R foot when ambulating - nursing staff made aware.  Session 2: Continues to demo sensitivity of feet when donning/doffing shoes, but no signs of pain when WBing on R LE this session.   Therapy/Group: Individual Therapy  Tawana Scale , PT, DPT, NCS, CSRS 02/12/2021, 7:46 AM

## 2021-02-12 NOTE — Progress Notes (Addendum)
[facility] PHYSICAL MEDICINE & REHABILITATION PROGRESS NOTE  Subjective/Complaints: Uneventful night. Resting comfortably when I arrived. Apparently had a vasovagal episode on toilet after I rounded this morning. Pt quickly came too after being unresponsive briefly  ROS: Patient denies fever, rash, sore throat, blurred vision, nausea, vomiting, diarrhea, cough, shortness of breath or chest pain, joint or back pain, headache, or mood change.   Objective: Vital Signs: Blood pressure 125/61, pulse 71, temperature 98.2 F (36.8 C), temperature source Oral, resp. rate 16, height 5' 2.01" (1.575 m), weight 57.8 kg, SpO2 95 %. No results found. Recent Labs    02/10/21 0350 02/11/21 0545  WBC 14.1* 12.8*  HGB 10.0* 10.5*  HCT 31.3* 33.0*  PLT 496* 516*   Recent Labs    02/10/21 0350 02/11/21 0545  NA 136 136  K 3.4* 3.8  CL 102 103  CO2 24 24  GLUCOSE 118* 215*  BUN 25* 31*  CREATININE 1.03* 1.15*  CALCIUM 9.0 9.1    Intake/Output Summary (Last 24 hours) at 02/12/2021 0859 Last data filed at 02/12/2021 0600 Gross per 24 hour  Intake 300 ml  Output 1 ml  Net 299 ml        Physical Exam: BP 125/61 (BP Location: Left Arm)   Pulse 71   Temp 98.2 F (36.8 C) (Oral)   Resp 16   Ht 5' 2.01" (1.575 m)   Wt 57.8 kg   SpO2 95%   BMI 23.30 kg/m  Constitutional: No distress . Vital signs reviewed. HEENT: NCAT, EOMI, oral membranes moist Neck: supple Cardiovascular: RRR without murmur. No JVD    Respiratory/Chest: CTA Bilaterally without wheezes or rales. Normal effort    GI/Abdomen: BS +, non-tender, non-distended Ext: no clubbing, cyanosis, or edema Psych: flat, cooperative Musc: Right metarsal area and 1st MTP red, improved tenderness and edema.  Neuro: delayed processing. Impaired insight and awareness Follows simple commands.   Motor: UE 4-/5.  RLE: Hip flexion, knee extension 3+/5, ankle dorsiflexion 4/5 LLE: 3 -/5 proximal distal  Assessment/Plan: 1.  Functional deficits which require 3+ hours per day of interdisciplinary therapy in a comprehensive inpatient rehab setting. Physiatrist is providing close team supervision and 24 hour management of active medical problems listed below. Physiatrist and rehab team continue to assess barriers to discharge/monitor patient progress toward functional and medical goals   Care Tool:  Bathing    Body parts bathed by patient: Right arm, Left arm, Chest, Abdomen, Face, Left lower leg, Right lower leg, Left upper leg, Right upper leg     Body parts n/a: Buttocks, Front perineal area   Bathing assist Assist Level: Moderate Assistance - Patient 50 - 74%     Upper Body Dressing/Undressing Upper body dressing   What is the patient wearing?: Pull over shirt    Upper body assist Assist Level: Maximal Assistance - Patient 25 - 49%    Lower Body Dressing/Undressing Lower body dressing      What is the patient wearing?: Incontinence brief, Pants     Lower body assist Assist for lower body dressing: Total Assistance - Patient < 25%     Toileting Toileting    Toileting assist Assist for toileting: Maximal Assistance - Patient 25 - 49%     Transfers Chair/bed transfer  Transfers assist     Chair/bed transfer assist level: Moderate Assistance - Patient 50 - 74%     Locomotion Ambulation   Ambulation assist      Assist level: Maximal Assistance - Patient  25 - 49% Assistive device: Hand held assist Max distance: 5 ft   Walk 10 feet activity   Assist  Walk 10 feet activity did not occur: Safety/medical concerns        Walk 50 feet activity   Assist Walk 50 feet with 2 turns activity did not occur: Safety/medical concerns         Walk 150 feet activity   Assist Walk 150 feet activity did not occur: Safety/medical concerns         Walk 10 feet on uneven surface  activity   Assist Walk 10 feet on uneven surfaces activity did not occur: Safety/medical  concerns         Wheelchair     Assist Is the patient using a wheelchair?: Yes Type of Wheelchair: Manual Wheelchair activity did not occur: Refused         Wheelchair 50 feet with 2 turns activity    Assist            Wheelchair 150 feet activity     Assist  Wheelchair 150 feet activity did not occur: Refused        Medical Problem List and Plan: 1.  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  Conservative care per neurosurgery.Remove Scalp staples in 7-14 days.  -Continue CIR therapies including PT, OT, and SLP  2.  Antithrombotics: -DVT/anticoagulation:  Mechanical: Sequential compression devices, below knee Bilateral lower extremities             -antiplatelet therapy: Aspirin 81 mg daily on hold x1 week 3. Pain Management/Gout flare right foot: Tylenol as needed             -prednisone taper, avoid tight fitting shoes and socks  Improving 4. Mood: Celexa 10 mg daily, Namenda 28 mg nightly, Aricept 10 mg daily             -antipsychotic agents: N/A 5. Neuropsych: This patient is not capable of making decisions on her own behalf. 6. Skin/Wound Care: Routine skin checks             -eucerin cream BLE 7. Fluids/Electrolytes/Nutrition: Routine in and outs 8.  CKD.  Creatinine baseline 1.03-1.39.    Creatinine 1.15 on 9/23  9/24 BUN up to 31 yesterday, given this morning's episode probably a little dry. Will begin 1/2ns 75cc/hr and recheck labs in AM  9.  Diabetes mellitus.  Hemoglobin A1c 6.2.  Metformin 500 mg twice daily prior to admission and resume as needed.  Monitor his increase mobility 10.  Klebsiella UTI.  Keflex completed. 11.  Hyperlipidemia.  Hold Zocor 20 mg daily secondary to elevated LFTs 12.  Orthostasis.  Currently on Inderal 20 mg twice daily as well as low-dose Norvasc 5 mg daily initiated 02/08/2021.     -9/24 bp's have been reasonable despite vv episode this  morning  13. Elevated Transaminases.  Ultrasound right upper quadrant showed 8 mm GB polyp.  Felt possibly due to recent antibiotic therapy for UTI.  Consider HIDA scan if remains elevated.  Patient asymptomatic at present. 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   Advanced to D3 thins, continue to advance as tolerated 15. Slow transit constipation  -prn suppository  -senna-s at HS  LOS: 2 days A FACE TO FACE EVALUATION WAS PERFORMED  [name] 02/12/2021, 8:59 AM

## 2021-02-13 DIAGNOSIS — S065X0S Traumatic subdural hemorrhage without loss of consciousness, sequela: Secondary | ICD-10-CM

## 2021-02-13 LAB — BASIC METABOLIC PANEL
Anion gap: 10 (ref 5–15)
BUN: 28 mg/dL — ABNORMAL HIGH (ref 8–23)
CO2: 23 mmol/L (ref 22–32)
Calcium: 8.7 mg/dL — ABNORMAL LOW (ref 8.9–10.3)
Chloride: 103 mmol/L (ref 98–111)
Creatinine, Ser: 1.04 mg/dL — ABNORMAL HIGH (ref 0.44–1.00)
GFR, Estimated: 54 mL/min — ABNORMAL LOW (ref >60)
Glucose, Bld: 136 mg/dL — ABNORMAL HIGH (ref 70–99)
Potassium: 4 mmol/L (ref 3.5–5.1)
Sodium: 136 mmol/L (ref 135–145)

## 2021-02-13 LAB — HCV RT-PCR, QUANT (NON-GRAPH): Hepatitis C Quantitation: NOT DETECTED IU/mL

## 2021-02-13 LAB — HCV AB W REFLEX TO QUANT PCR: HCV Ab: 1.9 s/co ratio — ABNORMAL HIGH (ref 0.0–0.9)

## 2021-02-13 MED ORDER — ASPIRIN 81 MG PO CHEW
81.0000 mg | CHEWABLE_TABLET | Freq: Every day | ORAL | Status: DC
  Administered 2021-02-14 – 2021-03-01 (×16): 81 mg via ORAL
  Filled 2021-02-13 (×16): qty 1

## 2021-02-13 NOTE — Progress Notes (Signed)
[facility] PHYSICAL MEDICINE & REHABILITATION PROGRESS NOTE  Subjective/Complaints: No further vasovagal episodes recorded , discussed resuming aspirin with patient she was not aware that she was on it, remains confused  ROS: Patient denies CP, SOB, N/V/D  Objective: Vital Signs: Blood pressure (!) 153/65, pulse 75, temperature 98.7 F (37.1 C), resp. rate 16, height 5' 2.01" (1.575 m), weight 57.8 kg, SpO2 99 %. No results found. Recent Labs    02/11/21 0545  WBC 12.8*  HGB 10.5*  HCT 33.0*  PLT 516*    Recent Labs    02/11/21 0545 02/13/21 0643  NA 136 136  K 3.8 4.0  CL 103 103  CO2 24 23  GLUCOSE 215* 136*  BUN 31* 28*  CREATININE 1.15* 1.04*  CALCIUM 9.1 8.7*     Intake/Output Summary (Last 24 hours) at 02/13/2021 1145 Last data filed at 02/13/2021 0845 Gross per 24 hour  Intake 1680.13 ml  Output --  Net 1680.13 ml         Physical Exam: BP (!) 153/65 (BP Location: Left Arm)   Pulse 75   Temp 98.7 F (37.1 C)   Resp 16   Ht 5' 2.01" (1.575 m)   Wt 57.8 kg   SpO2 99%   BMI 23.30 kg/m   General: No acute distress Mood and affect are appropriate Heart: Regular rate and rhythm no rubs murmurs or extra sounds Lungs: Clear to auscultation, breathing unlabored, no rales or wheezes Abdomen: Positive bowel sounds, soft nontender to palpation, nondistended Extremities: No clubbing, cyanosis, or edema Skin: No evidence of breakdown, no evidence of rash   Neuro: delayed processing. Impaired insight and awareness Follows simple commands.   Motor: UE 4-/5.  RLE: Hip flexion, knee extension 3+/5, ankle dorsiflexion 4/5 LLE: 3 -/5 proximal distal  Assessment/Plan: 1. Functional deficits which require 3+ hours per day of interdisciplinary therapy in a comprehensive inpatient rehab setting. Physiatrist is providing close team supervision and 24 hour management of active medical problems listed below. Physiatrist and rehab team continue to assess  barriers to discharge/monitor patient progress toward functional and medical goals   Care Tool:  Bathing    Body parts bathed by patient: Right arm, Left arm, Chest, Abdomen, Face, Left lower leg, Right lower leg, Left upper leg, Right upper leg     Body parts n/a: Buttocks, Front perineal area   Bathing assist Assist Level: Moderate Assistance - Patient 50 - 74%     Upper Body Dressing/Undressing Upper body dressing   What is the patient wearing?: Pull over shirt    Upper body assist Assist Level: Maximal Assistance - Patient 25 - 49%    Lower Body Dressing/Undressing Lower body dressing      What is the patient wearing?: Incontinence brief, Pants     Lower body assist Assist for lower body dressing: Total Assistance - Patient < 25%     Toileting Toileting    Toileting assist Assist for toileting: Maximal Assistance - Patient 25 - 49%     Transfers Chair/bed transfer  Transfers assist     Chair/bed transfer assist level: Minimal Assistance - Patient > 75% Chair/bed transfer assistive device: Armrests, Programmer, multimedia   Ambulation assist      Assist level: Minimal Assistance - Patient > 75% Assistive device: Walker-rolling Max distance: 15'   Walk 10 feet activity   Assist  Walk 10 feet activity did not occur: Safety/medical concerns        Walk 50  feet activity   Assist Walk 50 feet with 2 turns activity did not occur: Safety/medical concerns         Walk 150 feet activity   Assist Walk 150 feet activity did not occur: Safety/medical concerns         Walk 10 feet on uneven surface  activity   Assist Walk 10 feet on uneven surfaces activity did not occur: Safety/medical concerns         Wheelchair     Assist Is the patient using a wheelchair?: Yes Type of Wheelchair: Manual Wheelchair activity did not occur: Refused         Wheelchair 50 feet with 2 turns activity    Assist             Wheelchair 150 feet activity     Assist  Wheelchair 150 feet activity did not occur: Refused        Medical Problem List and Plan: 1.  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  Conservative care per neurosurgery.Remove Scalp staples in 7-14 days.Will order staple removal in am   -Continue CIR therapies including PT, OT, and SLP  2.  Antithrombotics: -DVT/anticoagulation:  Mechanical: Sequential compression devices, below knee Bilateral lower extremities             -antiplatelet therapy: Aspirin 81 mg daily resume in am  3. Pain Management/Gout flare right foot: Tylenol as needed             -prednisone taper, avoid tight fitting shoes and socks  Improving 4. Mood: Celexa 10 mg daily, Namenda 28 mg nightly, Aricept 10 mg daily             -antipsychotic agents: N/A 5. Neuropsych: This patient is not capable of making decisions on her own behalf. 6. Skin/Wound Care: Routine skin checks             -eucerin cream BLE 7. Fluids/Electrolytes/Nutrition: Routine in and outs 8.  CKD.  Creatinine baseline 1.03-1.39.    Creatinine 1.15 on 9/23  9/24 BUN up to 31 yesterday, given this morning's episode probably a little dry. Will begin 1/2ns 75cc/hr and recheck labs  look about the same cont IVF since po intake remains minimal Labs look minimally better we will continue IV hydration 9.  Diabetes mellitus.  Hemoglobin A1c 6.2.  Metformin 500 mg twice daily prior to admission and resume as needed.  CBG (last 3)  Recent Labs    02/12/21 0901  GLUCAP 169*    10.  Klebsiella UTI.  Keflex completed. 11.  Hyperlipidemia.  Hold Zocor 20 mg daily secondary to elevated LFTs 12.  Orthostasis.  Currently on Inderal 20 mg twice daily as well as low-dose Norvasc 5 mg daily initiated 02/08/2021.     -9/24 bp's have been reasonable despite vv episode this morning  13. Elevated Transaminases.  Ultrasound right upper quadrant  showed 8 mm GB polyp.  Felt possibly due to recent antibiotic therapy for UTI.  Consider HIDA scan if remains elevated.  Patient asymptomatic at present. 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   Advanced to D3 thins, continue to advance as tolerated 15. Slow transit constipation  -prn suppository  -senna-s at HS  LOS: 3 days A FACE TO FACE EVALUATION WAS PERFORMED  Charlett Blake 02/13/2021, 11:45 AM

## 2021-02-13 NOTE — Progress Notes (Signed)
  Fortunately her hepatitis C RNA is negative.  I have gone ahead and checked mitochondrial antibodies, ANA and anti-smooth muscle antibody given chronic intermittent elevation of LFTs including alk phos.   We will follow-up on these labs but not necessarily round on the patient.  Do not suspect any significant liver dysfunction at this point.  Gatha Mayer, MD, [facility] 02/13/2021 3:10 PM

## 2021-02-14 LAB — COMPREHENSIVE METABOLIC PANEL
ALT: 50 U/L — ABNORMAL HIGH (ref 0–44)
AST: 35 U/L (ref 15–41)
Albumin: 2.5 g/dL — ABNORMAL LOW (ref 3.5–5.0)
Alkaline Phosphatase: 302 U/L — ABNORMAL HIGH (ref 38–126)
Anion gap: 8 (ref 5–15)
BUN: 17 mg/dL (ref 8–23)
CO2: 25 mmol/L (ref 22–32)
Calcium: 8.8 mg/dL — ABNORMAL LOW (ref 8.9–10.3)
Chloride: 102 mmol/L (ref 98–111)
Creatinine, Ser: 1 mg/dL (ref 0.44–1.00)
GFR, Estimated: 57 mL/min — ABNORMAL LOW (ref >60)
Glucose, Bld: 123 mg/dL — ABNORMAL HIGH (ref 70–99)
Potassium: 3.6 mmol/L (ref 3.5–5.1)
Sodium: 135 mmol/L (ref 135–145)
Total Bilirubin: 1.3 mg/dL — ABNORMAL HIGH (ref 0.3–1.2)
Total Protein: 6.2 g/dL — ABNORMAL LOW (ref 6.5–8.1)

## 2021-02-14 MED ORDER — SODIUM CHLORIDE 0.45 % IV SOLN: INTRAVENOUS | Status: DC

## 2021-02-14 NOTE — Progress Notes (Signed)
Speech Language Pathology Daily Session Note  Patient Details  Name: [name] MRN: [id number] Date of Birth: [birth year]  Today's Date: 02/14/2021 SLP Individual Time: 1430-1530 SLP Individual Time Calculation (min): 60 min  Short Term Goals: Week 1: SLP Short Term Goal 1 (Week 1): Patient will follow simple one step-directions utilizing compensatory strategies with max A multimodal cues SLP Short Term Goal 2 (Week 1): Patient will express wants/needs when presented with two verbal options with mod A verbal cues SLP Short Term Goal 3 (Week 1): Patient will attend to basic famliar tasks for 5 minute intervals with max A verbal cues for redirection SLP Short Term Goal 4 (Week 1): Patient will locate appropriate information on external aids to improve orientation to place/situation with max A multimodal cues  Skilled Therapeutic Interventions: Pt greeted while semi-reclined in recliner chair and agreeable to skilled ST intervention with focus on cognitive goals. Pt was accompanied by her sister-in-law who reported pt appeared more drowsy this date as compared to her encounter 2 days prior. SLP also endorsed drowsiness which interfered with participation this session. Pt exhibited sustained attention of approximately 3 minute duration prior to requiring max A verbal redirection. SLP facilitated session by providing max-to-total A to identify information on orientation aids. Pt exhibited impaired reading skills. Daughter provided pt with familiar item from home (doll), although pt did not appear to recognize item nor able to provide any information about it. SLP also facilitated session by providing max A verbal cues to follow 1-step directions during color matching task in field of 2. Eventually reduced to field of 1 in which patient continued to require max A. Patient was able to make her needs known by answering yes/no questions to identify drink preferences. Provided pt with water in which she  consumed via straw with no overt s/sx of aspiration. Patient was left in chair with alarm activated and immediate needs within reach at end of session. Continue per current plan of care.      Pain Pain Assessment Pain Scale: 0-10 Pain Score: 0-No pain  Therapy/Group: Individual Therapy  Patty Sermons 02/14/2021, 4:16 PM

## 2021-02-14 NOTE — Progress Notes (Signed)
Physical Therapy TBI Note  Patient Details  Name: [name] MRN: [id number] Date of Birth: 1941/01/26  Today's Date: 02/14/2021 PT Individual Time: 5374-8270; 7867-5449 PT Individual Time Calculation (min): 25 min  and 25 mins Today's Date: 02/14/2021 PT Missed Time:   Missed Time Reason:    Short Term Goals: Week 1:  PT Short Term Goal 1 (Week 1): Pt will remain out of bed > 2 hours between therapies PT Short Term Goal 2 (Week 1): Pt will perform bed mobility with min assist PT Short Term Goal 3 (Week 1): Pt will ambulate 174ft with min assist and LRAD PT Short Term Goal 4 (Week 1): Pt will be able to attend to task for >5 minutes without redirection  Skilled Therapeutic Interventions/Progress Updates:    Session 1: Patient received supine in bed, asleep. Difficult to rouse. Upon waking, patient appearing scared/startled and stating she doesn't know where she is. PT reorienting patient and offering wet washcloth to assist with waking up. Patient agreeable. In the time it took to wet a washcloth, patient fell back asleep. Difficult to wake again with multimodal cues. Upon waking, patient stating again that she didn't know where she was. PT reorienting patient again and encouraging patient to participate in OOB therapy. Patient continuously falling asleep and then waking up disoriented. Patient missed 15 mins of therapy due to fatigue/lethargy.   Session 2: Patient received sitting up in recliner, agreeable to PT. She remains very disoriented despite multiple attempts from PT to reorient. She denies pain, but appeared hesitant to weight bear through R LE. Patient requiring frequent reminders to attend to simple tasks. She was able to come to stand with ModA and extended time due to poor initiation and attention to task. In standing, strong posterior bias with weight braced against recliner despite PT attempts to have patient shift weight anteriorly and/or take small steps forward. Patient  completing dynamic task of weaving shoe lace through hole on her fidget blanket. She required Max cues to complete this task. Patient standing again with RW, but no significant difference in her performance. Patient remaining up in recliner, legs elevated, seatbelt alarm on, call light within reach.   Therapy Documentation Precautions:  Precautions Precautions: Fall Precaution Comments: lethargic, confused Restrictions Weight Bearing Restrictions: No  Agitated Behavior Scale: TBI Observation Details Observation Environment: patients room Start of observation period - Date: 02/14/21 Start of observation period - Time: 1500       Therapy/Group: Individual Therapy  Karoline Caldwell, PT, DPT, CBIS  02/14/2021, 4:01 PM

## 2021-02-14 NOTE — Progress Notes (Signed)
[facility]  Checked on patient's labs this morning.  Her LFTs continue to trend down.  AMA, ANA and ASMA as ordered by Dr. Carlean Purl yesterday are still pending.  We will continue to follow her labs, but will not see the patient in person today.  Ellouise Newer, PA-C

## 2021-02-14 NOTE — Progress Notes (Signed)
Occupational Therapy Session Note  Patient Details  Name: INETTE DOUBRAVA MRN: [id number] Date of Birth: 1941-05-13  Today's Date: 02/14/2021 OT Individual Time: 1000-1115 OT Individual Time Calculation (min): 75 min    Short Term Goals: Week 1:  OT Short Term Goal 1 (Week 1): Pt will be able to don shirt with MIN A OT Short Term Goal 2 (Week 1): Pt will complete sit > stand with LRAD and MIN A in prep for LB dressing OT Short Term Goal 3 (Week 1): Pt will complete UB bathing with no more than MIN cuing OT Short Term Goal 4 (Week 1): Pt will complete transfer to raised toilet with MOD A  Skilled Therapeutic Interventions/Progress Updates:    Patient in bed, alert with ongoing stimulation.  Incontinent of bowel and bladder and dependent to complete hygiene, change linens and complete lower body dressing.  She is quite sensitive bilateral lower legs - Dr Letta Pate aware and agrees with lotion and teds at this time as provided with increased time due to painful response.  She is able to roll right/left with min a and cues.   Side lying to sitting edge of bed with mod A.  She is able to sit with min A due to lateral lean to the right.  SPT bed to w/c mod A.  Assisted with hair washing after review of scalp with Dr Letta Pate - increased time due to matted hair.  OH shirt max A.  Oral care min a with increased time and min cues.  SPT to recliner with mod A, she remained seated in recliner at close of session, seat belt alarm set and call bell in hand.    Therapy Documentation Precautions:  Precautions Precautions: Fall Precaution Comments: lethargic, confused Restrictions Weight Bearing Restrictions: No   Therapy/Group: Individual Therapy  Carlos Levering 02/14/2021, 7:32 AM

## 2021-02-14 NOTE — Progress Notes (Signed)
[place] PHYSICAL MEDICINE & REHABILITATION PROGRESS NOTE  Subjective/Complaints: Labs reviewed BUN improved  Pt somnolent today per OT, was reportedly more alert in PT   ROS: Patient denies CP, SOB, N/V/D  Objective: Vital Signs: Blood pressure (!) 156/72, pulse 74, temperature 98.7 F (37.1 C), resp. rate 17, height 5' 2.01" (1.575 m), weight 57.8 kg, SpO2 94 %. No results found. No results for input(s): WBC, HGB, HCT, PLT in the last 72 hours.  Recent Labs    02/13/21 0643 02/14/21 0725  NA 136 135  K 4.0 3.6  CL 103 102  CO2 23 25  GLUCOSE 136* 123*  BUN 28* 17  CREATININE 1.04* 1.00  CALCIUM 8.7* 8.8*     Intake/Output Summary (Last 24 hours) at 02/14/2021 1028 Last data filed at 02/13/2021 1752 Gross per 24 hour  Intake 519.65 ml  Output --  Net 519.65 ml         Physical Exam: BP (!) 156/72 (BP Location: Left Arm)   Pulse 74   Temp 98.7 F (37.1 C)   Resp 17   Ht 5' 2.01" (1.575 m)   Wt 57.8 kg   SpO2 94%   BMI 23.30 kg/m    General: No acute distress Mood and affect are appropriate Heart: Regular rate and rhythm no rubs murmurs or extra sounds Lungs: Clear to auscultation, breathing unlabored, no rales or wheezes Abdomen: Positive bowel sounds, soft nontender to palpation, nondistended Extremities: No clubbing, cyanosis, or edema Skin: No evidence of breakdown, no evidence of rash, 3 thick scabs in occipital area, no staples identified   Neuro: delayed processing. Impaired insight and awareness Follows simple commands.   Motor: UE 4-/5.  RLE: Hip flexion, knee extension 3+/5, ankle dorsiflexion 4/5 LLE: 3 -/5 proximal distal  Assessment/Plan: 1. Functional deficits which require 3+ hours per day of interdisciplinary therapy in a comprehensive inpatient rehab setting. Physiatrist is providing close team supervision and 24 hour management of active medical problems listed below. Physiatrist and rehab team continue to assess barriers to  discharge/monitor patient progress toward functional and medical goals   Care Tool:  Bathing    Body parts bathed by patient: Right arm, Left arm, Chest, Abdomen, Face, Left lower leg, Right lower leg, Left upper leg, Right upper leg     Body parts n/a: Buttocks, Front perineal area   Bathing assist Assist Level: Moderate Assistance - Patient 50 - 74%     Upper Body Dressing/Undressing Upper body dressing   What is the patient wearing?: Dress    Upper body assist Assist Level: Maximal Assistance - Patient 25 - 49%    Lower Body Dressing/Undressing Lower body dressing      What is the patient wearing?: Incontinence brief     Lower body assist Assist for lower body dressing: Maximal Assistance - Patient 25 - 49%     Toileting Toileting    Toileting assist Assist for toileting: Maximal Assistance - Patient 25 - 49%     Transfers Chair/bed transfer  Transfers assist     Chair/bed transfer assist level: Minimal Assistance - Patient > 75% Chair/bed transfer assistive device: Armrests, Walker   Locomotion Ambulation   Ambulation assist      Assist level: Minimal Assistance - Patient > 75% Assistive device: Walker-rolling Max distance: 15'   Walk 10 feet activity   Assist  Walk 10 feet activity did not occur: Safety/medical concerns        Walk 50 feet activity   Assist  Walk 50 feet with 2 turns activity did not occur: Safety/medical concerns         Walk 150 feet activity   Assist Walk 150 feet activity did not occur: Safety/medical concerns         Walk 10 feet on uneven surface  activity   Assist Walk 10 feet on uneven surfaces activity did not occur: Safety/medical concerns         Wheelchair     Assist Is the patient using a wheelchair?: Yes Type of Wheelchair: Manual Wheelchair activity did not occur: Refused         Wheelchair 50 feet with 2 turns activity    Assist            Wheelchair 150 feet  activity     Assist  Wheelchair 150 feet activity did not occur: Refused        Medical Problem List and Plan: 1.  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  Conservative care per neurosurgery.Remove Scalp staples in 7-14 days.Will order staple removal in am after hair is washed a couple thick scabs may be obscuring staples   -Continue CIR therapies including PT, OT, and SLP  2.  Antithrombotics: -DVT/anticoagulation:  Mechanical: Sequential compression devices, below knee Bilateral lower extremities             -antiplatelet therapy: Aspirin 81 mg daily resume in am  3. Pain Management/Gout flare right foot: Tylenol as needed             -prednisone taper, avoid tight fitting shoes and socks  Improving 4. Mood: Celexa 10 mg daily, Namenda 28 mg nightly, Aricept 10 mg daily             -antipsychotic agents: N/A 5. Neuropsych: This patient is not capable of making decisions on her own behalf. 6. Skin/Wound Care: Routine skin checks             -eucerin cream BLE 7. Fluids/Electrolytes/Nutrition: Routine in and outs 8.  CKD.  Creatinine baseline 1.03-1.39.    Creatinine 1.15 on 9/23  9/24 BUN up to 31 yesterday, given this morning's episode probably a little dry. Will begin 1/2ns 75cc/hr and recheck labs  look about the same cont IVF since po intake remains minimal BUN improved but poor intake change IVF to nocturnal  9.  Diabetes mellitus.  Hemoglobin A1c 6.2.  Metformin 500 mg twice daily prior to admission and resume as needed.  CBG (last 3)  Recent Labs    02/12/21 0901  GLUCAP 169*     10.  Klebsiella UTI.  Keflex completed. 11.  Hyperlipidemia.  Hold Zocor 20 mg daily secondary to elevated LFTs 12.  Orthostasis.  Currently on Inderal 20 mg twice daily as well as low-dose Norvasc 5 mg daily initiated 02/08/2021.     Vitals:   02/13/21 2004 02/14/21 0406  BP: (!) 152/64 (!) 156/72  Pulse: 77 74   Resp: 18 17  Temp: 97.8 F (36.6 C) 98.7 F (37.1 C)  SpO2: 94% 94%    13. Elevated Transaminases.  Ultrasound right upper quadrant showed 8 mm GB polyp.  Felt possibly due to recent antibiotic therapy for UTI.  Consider HIDA scan if remains elevated.  Patient asymptomatic at present. 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   Advanced to D3 thins, continue to advance as tolerated 15. Slow transit constipation  -  prn suppository  -senna-s at HS  16.  Hx dementia on dual therapy , ? Baseline cognition  LOS: 4 days A FACE TO FACE EVALUATION WAS PERFORMED  Charlett Blake 02/14/2021, 10:28 AM

## 2021-02-15 ENCOUNTER — Inpatient Hospital Stay (HOSPITAL_COMMUNITY): Payer: Medicare Other

## 2021-02-15 DIAGNOSIS — K719 Toxic liver disease, unspecified: Secondary | ICD-10-CM

## 2021-02-15 LAB — URINALYSIS, ROUTINE W REFLEX MICROSCOPIC
Bilirubin Urine: NEGATIVE
Glucose, UA: NEGATIVE mg/dL
Hgb urine dipstick: NEGATIVE
Ketones, ur: NEGATIVE mg/dL
Leukocytes,Ua: NEGATIVE
Nitrite: NEGATIVE
Protein, ur: NEGATIVE mg/dL
Specific Gravity, Urine: 1.009 (ref 1.005–1.030)
pH: 6 (ref 5.0–8.0)

## 2021-02-15 LAB — MITOCHONDRIAL ANTIBODIES: Mitochondrial M2 Ab, IgG: <20 Units (ref 0.0–20.0)

## 2021-02-15 LAB — ANTI-SMOOTH MUSCLE ANTIBODY, IGG: F-Actin IgG: 4 Units (ref 0–19)

## 2021-02-15 NOTE — Progress Notes (Signed)
Physical Therapy Session Note  Patient Details  Name: [name] MRN: [id number] Date of Birth: Jul 24, 1940  Today's Date: 02/15/2021 PT Individual Time: 0940-1020 PT Individual Time Calculation (min): 40 min   and  Today's Date: 02/15/2021 PT Missed Time: 20 Minutes Missed Time Reason: Nursing care  Short Term Goals: Week 1:  PT Short Term Goal 1 (Week 1): Pt will remain out of bed > 2 hours between therapies PT Short Term Goal 2 (Week 1): Pt will perform bed mobility with min assist PT Short Term Goal 3 (Week 1): Pt will ambulate 120ft with min assist and LRAD PT Short Term Goal 4 (Week 1): Pt will be able to attend to task for >5 minutes without redirection  Skilled Therapeutic Interventions/Progress Updates:    Pt received supine in bed with her daughter, [name], present and pt agreeable to therapy session. Pt's daughter reports concern that pt has had a decline since Saturday. Pt awake during session but would fluctuate in her alertness and verbal response from being awake with eyes open and vocalizing a few word phrases clear to understand, compared to closing her eyes starting to doze off with unintelligible speech - would respond well to light verbal/tactile cuing to re-arouse but required this every few minutes.  Supine>sitting R EOB, HOB partially elevated and using bedrail, with min assist for B LE management and trunk upright - max multimodal cuing for sequencing and initiation but despite allowing increased time pt requiring assistance today.  Sitting EOB with close supervision for trunk control due to minor posterior lean, donned B LE TED hose, threaded pants, and donned shoes total assist. Sit>stand EOB>RW with mod assist for lifting to stand despite providing pt increased time to motor plan and process - continues to require max cuing not to pull up on RW. Pt noted to be incontinent therefore planned to ambulate to bathroom - initiated 2 small steps forward but pt unable to  continue and rather slowly starts to crouch down and lean posteriorly - returned to sitting EOB for safety.   Nurse arrived reporting need to collect urine sample. Sit>stand again as above and pt able to take ~8 very small steps R towards [facility] for improved positioning using RW and min assist for balance and AD management. Doffed pants in standing total assist with min assist for balance. Sit>supine mod assist for B LE management into bed. Pt left supine in bed in the care of nursing.  Linna Hoff, [place] and nurse aware of pt's current status and family concerns. Missed 20 minutes of skilled physical therapy.  Sitting Vitals: BP 135/56 (MAP 79), HR 59bpm, after a few minutes BP 124/54 (MAP 75), HR 57bpm   Therapy Documentation Precautions:  Precautions Precautions: Fall Precaution Comments: lethargic, confused Restrictions Weight Bearing Restrictions: No   Pain:  Continues to have sensitivity to touch in feet and lower legs. Requested daughter bring pt in some larger shoes as the current ones are snug on the tops of pt's feet.   Therapy/Group: Individual Therapy  Tawana Scale , PT, DPT, NCS, CSRS 02/15/2021, 7:50 AM

## 2021-02-15 NOTE — Progress Notes (Signed)
Occupational Therapy TBI Note  Patient Details  Name: [name] MRN: [id number] Date of Birth: 03-14-1941  Today's Date: 02/15/2021 OT Individual Time: 1300-1401 OT Individual Time Calculation (min): 61 min  Missed 14 minutes as pt meeting with MD   Short Term Goals: Week 1:  OT Short Term Goal 1 (Week 1): Pt will be able to don shirt with MIN A OT Short Term Goal 2 (Week 1): Pt will complete sit > stand with LRAD and MIN A in prep for LB dressing OT Short Term Goal 3 (Week 1): Pt will complete UB bathing with no more than MIN cuing OT Short Term Goal 4 (Week 1): Pt will complete transfer to raised toilet with MOD A  Skilled Therapeutic Interventions/Progress Updates:  Pt greeted supine in bed with PA and daughter present. PA reporting change in mental status but agreeable to OT intervention. Pt currently requires MOD A for bed mobility with pt initiating supine>sitting but ultimately needed assist to scoot hips fully to EOB as pt noted to present with impaired attention to task throughout session with pt becoming distracted by extraneous stimuli in bed environment. Pt required total A to don jeans from EOB however unable to snap jeans; daughter to bring sweat pants for tomorrows session. Session focus on functional transfers. Attempted sit<>stand multiple times from EOB with RW with pt needing up to MOD A at times with Rw as pt presents with flexed posture with difficulty shifting hips anteriorly into full stand. Removed AD with pt able to improve transition  from sitting>standing.  Able to stand with MINA at times; however pt stating "I can't I can't" when attempting to pivot to recliner. Pt returns to sitting and noted to grab her head. When asked if she felt dizzy pt states "yes", took BP from sitting 100/65; attempted to get pressure in standing however pt unable to stand long enough to obtain pressure. Able to pivot pt to recliner via face to face transfer with MAX A +1. pt left seated in  recliner with chair alarm activated, son present and all needs in reach.   Recommend nursing use stedy to return pt to bed.                  Therapy Documentation Precautions:  Precautions Precautions: Fall Precaution Comments: lethargic, confused Restrictions Weight Bearing Restrictions: No  Pain: No pain indicated during session  Agitated Behavior Scale: TBI Observation Details Observation Environment: patients room Start of observation period - Date: 02/15/21 Start of observation period - Time: 1300 End of observation period - Date: 02/15/21 End of observation period - Time: 1401 Agitated Behavior Scale (DO NOT LEAVE BLANKS) Short attention span, easy distractibility, inability to concentrate: Present to a moderate degree Impulsive, impatient, low tolerance for pain or frustration: Present to a slight degree Uncooperative, resistant to care, demanding: Absent Violent and/or threatening violence toward people or property: Absent Explosive and/or unpredictable anger: Absent Rocking, rubbing, moaning, or other self-stimulating behavior: Present to a slight degree Pulling at tubes, restraints, etc.: Absent Wandering from treatment areas: Absent Restlessness, pacing, excessive movement: Present to a slight degree Repetitive behaviors, motor, and/or verbal: Absent Rapid, loud, or excessive talking: Absent Sudden changes of mood: Absent Easily initiated or excessive crying and/or laughter: Absent Self-abusiveness, physical and/or verbal: Absent Agitated behavior scale total score: 19     Therapy/Group: Individual Therapy  Precious Haws 02/15/2021, 3:36 PM

## 2021-02-15 NOTE — Progress Notes (Signed)
Linna Hoff, PA aware of CT results.    Gerald Stabs, RN

## 2021-02-15 NOTE — Progress Notes (Signed)
Family reported concerns regarding patient's alertness since vasovagal episode over the weekend. Dr. Ella Bodo and Linna Hoff, [place] are aware. UA sent to lab. New order obtained for CT of head.   Gerald Stabs, RN

## 2021-02-15 NOTE — Progress Notes (Signed)
[place] PHYSICAL MEDICINE & REHABILITATION PROGRESS NOTE  Subjective/Complaints:  Pt with fluctuating level of alertness , low grade temp 99 yesterday  ROS: Patient denies CP, SOB, N/V/D  Objective: Vital Signs: Blood pressure (!) 165/64, pulse 63, temperature 99 F (37.2 C), resp. rate 14, height 5' 2.01" (1.575 m), weight 57.8 kg, SpO2 99 %. No results found. No results for input(s): WBC, HGB, HCT, PLT in the last 72 hours.  Recent Labs    02/13/21 0643 02/14/21 0725  NA 136 135  K 4.0 3.6  CL 103 102  CO2 23 25  GLUCOSE 136* 123*  BUN 28* 17  CREATININE 1.04* 1.00  CALCIUM 8.7* 8.8*     Intake/Output Summary (Last 24 hours) at 02/15/2021 0824 Last data filed at 02/15/2021 0500 Gross per 24 hour  Intake 757.43 ml  Output --  Net 757.43 ml         Physical Exam: BP (!) 165/64 (BP Location: Right Arm)   Pulse 63   Temp 99 F (37.2 C)   Resp 14   Ht 5' 2.01" (1.575 m)   Wt 57.8 kg   SpO2 99%   BMI 23.30 kg/m    General: No acute distress Mood and affect are appropriate Heart: Regular rate and rhythm no rubs murmurs or extra sounds Lungs: Clear to auscultation, breathing unlabored, no rales or wheezes Abdomen: Positive bowel sounds, soft nontender to palpation, nondistended Extremities: No clubbing, cyanosis, or edema Skin: No evidence of breakdown, no evidence of rash, 3 clean scabs in occipital area, no staples identified   Neuro: delayed processing. Impaired insight and awareness Follows simple commands.   Motor: UE 4-/5.  RLE: Hip flexion, knee extension 3+/5, ankle dorsiflexion 4/5 LLE: 3 -/5 proximal distal  Assessment/Plan: 1. Functional deficits which require 3+ hours per day of interdisciplinary therapy in a comprehensive inpatient rehab setting. Physiatrist is providing close team supervision and 24 hour management of active medical problems listed below. Physiatrist and rehab team continue to assess barriers to discharge/monitor patient  progress toward functional and medical goals   Care Tool:  Bathing    Body parts bathed by patient: Face, Right arm, Left arm, Chest, Abdomen   Body parts bathed by helper: Abdomen, Front perineal area, Buttocks, Right upper leg, Left upper leg, Right lower leg, Left lower leg Body parts n/a: Buttocks, Front perineal area   Bathing assist Assist Level: Maximal Assistance - Patient 24 - 49%     Upper Body Dressing/Undressing Upper body dressing   What is the patient wearing?: Pull over shirt    Upper body assist Assist Level: Maximal Assistance - Patient 25 - 49%    Lower Body Dressing/Undressing Lower body dressing      What is the patient wearing?: Pants, Incontinence brief     Lower body assist Assist for lower body dressing: Total Assistance - Patient < 25%     Toileting Toileting    Toileting assist Assist for toileting: Dependent - Patient 0%     Transfers Chair/bed transfer  Transfers assist     Chair/bed transfer assist level: Minimal Assistance - Patient > 75% Chair/bed transfer assistive device: Armrests, Programmer, multimedia   Ambulation assist      Assist level: Minimal Assistance - Patient > 75% Assistive device: Walker-rolling Max distance: 15'   Walk 10 feet activity   Assist  Walk 10 feet activity did not occur: Safety/medical concerns        Walk 50 feet activity  Assist Walk 50 feet with 2 turns activity did not occur: Safety/medical concerns         Walk 150 feet activity   Assist Walk 150 feet activity did not occur: Safety/medical concerns         Walk 10 feet on uneven surface  activity   Assist Walk 10 feet on uneven surfaces activity did not occur: Safety/medical concerns         Wheelchair     Assist Is the patient using a wheelchair?: Yes Type of Wheelchair: Manual Wheelchair activity did not occur: Refused         Wheelchair 50 feet with 2 turns activity    Assist             Wheelchair 150 feet activity     Assist  Wheelchair 150 feet activity did not occur: Refused        Medical Problem List and Plan: 1.  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  Conservative care per neurosurgery.Remove Scalp staples in 7-14 days.  -Continue CIR therapies including PT, OT, and SLP  2.  Antithrombotics: -DVT/anticoagulation:  Mechanical: Sequential compression devices, below knee Bilateral lower extremities             -antiplatelet therapy: Aspirin 81 mg daily resume in am  3. Pain Management/Gout flare right foot: Tylenol as needed             -prednisone taper, avoid tight fitting shoes and socks  Improving 4. Mood: Celexa 10 mg daily, Namenda 28 mg nightly, Aricept 10 mg daily             -antipsychotic agents: N/A 5. Neuropsych: This patient is not capable of making decisions on her own behalf. 6. Skin/Wound Care: Routine skin checks             -eucerin cream BLE 7. Fluids/Electrolytes/Nutrition: Routine in and outs 8.  CKD.  Creatinine baseline 1.03-1.39.    At baseline or better BUN improved but poor intake change IVF to nocturnal  9.  Diabetes mellitus.  Hemoglobin A1c 6.2.  Metformin 500 mg twice daily prior to admission and resume as needed.  CBG (last 3)  Recent Labs    02/12/21 0901  GLUCAP 169*     10.  Klebsiella UTI.  Keflex completed.- will recheck UA to see if infection cleared  11.  Hyperlipidemia.  Hold Zocor 20 mg daily secondary to elevated LFTs 12.  Orthostasis.  Currently on Inderal 20 mg twice daily as well as low-dose Norvasc 5 mg daily initiated 02/08/2021.     Vitals:   02/15/21 0434 02/15/21 0753  BP: (!) 171/62 (!) 165/64  Pulse: 66 63  Resp: 14   Temp: 99 F (37.2 C)   SpO2: 99% 99%   Sitting BPs running about 73mmHg lower than lying - will ask nursing to check in sitting position  13. Elevated Transaminases.  Ultrasound right upper  quadrant showed 8 mm GB polyp.  Felt possibly due to recent antibiotic therapy for UTI.  Consider HIDA scan if remains elevated.  Patient asymptomatic at present. 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   Advanced to D3 thins, continue to advance as tolerated 15. Slow transit constipation  -prn suppository  -senna-s at HS  16.  Hx dementia on dual therapy , ? Baseline cognition  LOS: 5 days A FACE TO FACE EVALUATION  WAS PERFORMED  Charlett Blake 02/15/2021, 8:24 AM

## 2021-02-15 NOTE — Progress Notes (Addendum)
Speech Language Pathology Daily Session Note  Patient Details  Name: [name] MRN: [id number] Date of Birth: Jan 12, 1941  Today's Date: 02/15/2021 SLP Individual Time: 1324-4010 SLP Individual Time Calculation (min): 50 min  Short Term Goals: Week 1: SLP Short Term Goal 1 (Week 1): Patient will follow simple one step-directions utilizing compensatory strategies with max A multimodal cues SLP Short Term Goal 2 (Week 1): Patient will express wants/needs when presented with two verbal options with mod A verbal cues SLP Short Term Goal 3 (Week 1): Patient will attend to basic famliar tasks for 5 minute intervals with max A verbal cues for redirection SLP Short Term Goal 4 (Week 1): Patient will locate appropriate information on external aids to improve orientation to place/situation with max A multimodal cues  Skilled Therapeutic Interventions: Pt greeted in bed at 90 degrees and consuming breakfast on arrival. She was accompanied by her daughter who was providing intermittent feeding assist. Pt consumed pureed and Dys 3 textures and thin liquids with functional oral phase and no overt s/sx of aspiration. Recommend continuation of Dys 3 textures and thin liquids. Trialed use of red foam to support grip with spoon, as pt exhibited difficulty grasping spoon for effective execution from plate to mouth. Red foam appeared to improve control and accuracy with self feeding. Pt required overall mod A for self feeding due to cognitive factors. Daughter endorsed difficulty self feeding is a new change. Notified OT. Daughter also reported concern for reduced alertness since vasovagal episode over the weekend. She also reported that pt's son visited yesterday and said pt exhibited possible hallucinations due to pt report of "bugs crawling on the nurse." Daughter had specific questions for nursing thus notified RN who arrived to speak with daughter at end of session. Per informal assessment this date, pt appeared  drowsy and exhibited decreased attention to tasks.  Consistent with yesterday's ST session, she required max A verbal/visual cues and extensive repetition for following basic 1-step commands and responding to simple biographical and environmental questions via yes/no question and given field of 2 diverse choices. Pt demonstrated decreased social engagement with daughter and took extended processing time to recognize who she was. Pt exhibited disorganized thought organization and mumbling impacting speech intelligibility. Suspect this attributed to lethargy however as pt was known to fall in and out of sleep during this encounter. ST communicated observations and daughter's concerns to pt's nurse and therapy team. Patient was left in bed with alarm activated and immediate needs within reach at end of session. Continue per current plan of care.      Pain Pain Assessment Pain Scale: 0-10 Pain Score: 0-No pain PAINAD (Pain Assessment in Advanced Dementia) Breathing: normal Negative Vocalization: none Facial Expression: smiling or inexpressive Body Language: relaxed Consolability: no need to console PAINAD Score: 0  Therapy/Group: Individual Therapy  Patty Sermons 02/15/2021, 12:26 PM

## 2021-02-15 NOTE — Progress Notes (Signed)
Progress Note   Subjective  No complaints No abd pain, nausea or vomiting Daughter at bedside    Objective  Vital signs in last 24 hours: Temp:  [98.2 F (36.8 C)-99 F (37.2 C)] 99 F (37.2 C) (09/27 0434) Pulse Rate:  [58-77] 63 (09/27 0753) Resp:  [14-18] 14 (09/27 0434) BP: (148-171)/(60-64) 165/64 (09/27 0753) SpO2:  [97 %-99 %] 99 % (09/27 0753) Last BM Date: 02/15/21  General: NAD Heart:  Regular rate and rhythm; no murmurs Chest: Clear to ascultation bilaterally Abdomen:  Soft, nontender and nondistended. Normal bowel sounds, without guarding, and without rebound.   Extremities:  Without edema. B/l compression hose below the knee   Intake/Output from previous day: 09/26 0701 - 09/27 0700 In: 757.4 [P.O.:120; I.V.:637.4] Out: -  Intake/Output this shift: Total I/O In: -  Out: 500 [Urine:500]  Lab Results: No results for input(s): WBC, HGB, HCT, PLT in the last 72 hours. BMET Recent Labs    02/13/21 0643 02/14/21 0725  NA 136 135  K 4.0 3.6  CL 103 102  CO2 23 25  GLUCOSE 136* 123*  BUN 28* 17  CREATININE 1.04* 1.00  CALCIUM 8.7* 8.8*   LFT Recent Labs    02/14/21 0725  PROT 6.2*  ALBUMIN 2.5*  AST 35  ALT 50*  ALKPHOS 302*  BILITOT 1.3*   PT/INR No results for input(s): LABPROT, INR in the last 72 hours. Hepatitis Panel No results for input(s): HEPBSAG, HCVAB, HEPAIGM, HEPBIGM in the last 72 hours.  Studies/Results: CT HEAD WO CONTRAST (5MM)  Result Date: 02/15/2021 CLINICAL DATA:  80 year old female with history of subdural hemorrhage, follow-up evaluation. EXAM: CT HEAD WITHOUT CONTRAST TECHNIQUE: Contiguous axial images were obtained from the base of the skull through the vertex without intravenous contrast. COMPARISON:  Multiple prior studies most recent from February 03, 2021. FINDINGS: Brain: Stable ventricular size, no signs of midline shift. Mixed attenuation and diminished volume of subdural hematoma along the falx. Small  extra-axial collection along the RIGHT parieto-occipital convexity predominantly intermediate density with some mixed attenuation seen posteriorly measuring approximately 7 mm. No additional new extra-axial collections. Vascular: No hyperdense vessel or unexpected calcification. Skull: Normal. Negative for fracture or focal lesion. Sinuses/Orbits: Visualized paranasal sinuses and orbits are unremarkable. Other: None IMPRESSION: Mixed attenuation and diminished volume of subdural hematoma along the falx. New subdural with predominantly low attenuation along the RIGHT parieto-occipital convexity measuring up to 0.7 cm, potentially redistribution of previously identified subdural hematoma along the falx given density and diminished volume of hematoma about the falx when compared to previous imaging. No signs of midline shift with stable ventricular size. These results will be called to the ordering clinician or representative by the Radiologist Assistant, and communication documented in the PACS or [employer]. Electronically Signed   By: Zetta Bills M.D.   On: 02/15/2021 12:01      Assessment & Recommendations  80 yo with traumatic subdural hematoma and we are called for elevated LFTs.  Elevated LFTs -- very likely to be DILI.  Now improving.  No evidence of advanced liver disease.  Several "liver labs" remains pending (ANA, AMA, ASMA), but I do not think this is autoimmune liver disease.  I expect LFTs will continue to improve and normalize.  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  She is immune to Hep C.  --Supportive care --Avoid hepatotoxins when possible --Call if questions, GI will sign off at this time  LOS: 5 days   Jerene Bears  02/15/2021, 12:36 PM See Shea Evans, [facility], to contact our on call provider

## 2021-02-15 NOTE — Progress Notes (Signed)
Pt's son at bedside. Pt's son is a little concerned that in the last few days pt has been talking less and interacting less.

## 2021-02-15 NOTE — Progress Notes (Signed)
Cranial CT scan without contrast completed due to decreased level of alertness.  Scan reviewed by Dr. Duffy Rhody and did not note any acute changes advised to continue low-dose aspirin.  Spoke at length with daughter on all issues in regards to patient's alertness history of memory deficits continue Namenda as well as Aricept.  Follow-up urinalysis study was negative nitrite.

## 2021-02-16 LAB — ANTINUCLEAR ANTIBODIES, IFA: ANA Ab, IFA: POSITIVE — AB

## 2021-02-16 LAB — FANA STAINING PATTERNS
CENTROMERE AB: 3 — AB
Nuclear Dot Pattern: 67 — AB

## 2021-02-16 NOTE — Progress Notes (Addendum)
[facility] PHYSICAL MEDICINE & REHABILITATION PROGRESS NOTE  Subjective/Complaints:  Reviewed CT head  Discussed prior functional status as well as current level of function with pt's daughter ROS: Patient denies CP, SOB, N/V/D  Objective: Vital Signs: Blood pressure (!) 167/62, pulse 78, temperature 97.8 F (36.6 C), resp. rate 18, height 5' 2.01" (1.575 m), weight 57.8 kg, SpO2 100 %. CT HEAD WO CONTRAST (5MM)  Result Date: 02/15/2021 CLINICAL DATA:  80 year old female with history of subdural hemorrhage, follow-up evaluation. EXAM: CT HEAD WITHOUT CONTRAST TECHNIQUE: Contiguous axial images were obtained from the base of the skull through the vertex without intravenous contrast. COMPARISON:  Multiple prior studies most recent from February 03, 2021. FINDINGS: Brain: Stable ventricular size, no signs of midline shift. Mixed attenuation and diminished volume of subdural hematoma along the falx. Small extra-axial collection along the RIGHT parieto-occipital convexity predominantly intermediate density with some mixed attenuation seen posteriorly measuring approximately 7 mm. No additional new extra-axial collections. Vascular: No hyperdense vessel or unexpected calcification. Skull: Normal. Negative for fracture or focal lesion. Sinuses/Orbits: Visualized paranasal sinuses and orbits are unremarkable. Other: None IMPRESSION: Mixed attenuation and diminished volume of subdural hematoma along the falx. New subdural with predominantly low attenuation along the RIGHT parieto-occipital convexity measuring up to 0.7 cm, potentially redistribution of previously identified subdural hematoma along the falx given density and diminished volume of hematoma about the falx when compared to previous imaging. No signs of midline shift with stable ventricular size. These results will be called to the ordering clinician or representative by the Radiologist Assistant, and communication documented in the PACS or [employer]. Electronically Signed   By: Zetta Bills M.D.   On: 02/15/2021 12:01   No results for input(s): WBC, HGB, HCT, PLT in the last 72 hours.  Recent Labs    02/14/21 0725  NA 135  K 3.6  CL 102  CO2 25  GLUCOSE 123*  BUN 17  CREATININE 1.00  CALCIUM 8.8*     Intake/Output Summary (Last 24 hours) at 02/16/2021 2585 Last data filed at 02/15/2021 1030 Gross per 24 hour  Intake --  Output 500 ml  Net -500 ml      Pressure Injury 02/15/21 Buttocks Left Stage 1 -  Intact skin with non-blanchable redness of a localized area usually over a bony prominence. (Active)  02/15/21 1544  Location: Buttocks  Location Orientation: Left  Staging: Stage 1 -  Intact skin with non-blanchable redness of a localized area usually over a bony prominence.  Wound Description (Comments):   Present on Admission:     Physical Exam: BP (!) 167/62 (BP Location: Right Arm)   Pulse 78   Temp 97.8 F (36.6 C)   Resp 18   Ht 5' 2.01" (1.575 m)   Wt 57.8 kg   SpO2 100%   BMI 23.30 kg/m    General: No acute distress Mood and affect are appropriate Heart: Regular rate and rhythm no rubs murmurs or extra sounds Lungs: Clear to auscultation, breathing unlabored, no rales or wheezes Abdomen: Positive bowel sounds, soft nontender to palpation, nondistended Extremities: No clubbing, cyanosis, or edema Skin: No evidence of breakdown, no evidence of rash, 3 clean scabs in occipital area, no staples identified   Neuro: delayed processing. Impaired insight and awareness Follows simple commands.   Motor: UE 4-/5.  RLE: Hip flexion, knee extension 3+/5, ankle dorsiflexion 4/5 LLE: 3 -/5 proximal distal  Assessment/Plan: 1. Functional deficits which require 3+ hours per day of interdisciplinary  therapy in a comprehensive inpatient rehab setting. Physiatrist is providing close team supervision and 24 hour management of active medical problems listed below. Physiatrist and rehab team continue to  assess barriers to discharge/monitor patient progress toward functional and medical goals   Care Tool:  Bathing    Body parts bathed by patient: Face, Right arm, Left arm, Chest, Abdomen   Body parts bathed by helper: Abdomen, Front perineal area, Buttocks, Right upper leg, Left upper leg, Right lower leg, Left lower leg Body parts n/a: Buttocks, Front perineal area   Bathing assist Assist Level: Maximal Assistance - Patient 24 - 49%     Upper Body Dressing/Undressing Upper body dressing   What is the patient wearing?: Pull over shirt    Upper body assist Assist Level: Maximal Assistance - Patient 25 - 49%    Lower Body Dressing/Undressing Lower body dressing      What is the patient wearing?: Pants     Lower body assist Assist for lower body dressing: Total Assistance - Patient < 25%     Toileting Toileting    Toileting assist Assist for toileting: Dependent - Patient 0%     Transfers Chair/bed transfer  Transfers assist     Chair/bed transfer assist level: Maximal Assistance - Patient 25 - 49% Chair/bed transfer assistive device: Armrests, Programmer, multimedia   Ambulation assist      Assist level: Minimal Assistance - Patient > 75% Assistive device: Walker-rolling Max distance: 15'   Walk 10 feet activity   Assist  Walk 10 feet activity did not occur: Safety/medical concerns        Walk 50 feet activity   Assist Walk 50 feet with 2 turns activity did not occur: Safety/medical concerns         Walk 150 feet activity   Assist Walk 150 feet activity did not occur: Safety/medical concerns         Walk 10 feet on uneven surface  activity   Assist Walk 10 feet on uneven surfaces activity did not occur: Safety/medical concerns         Wheelchair     Assist Is the patient using a wheelchair?: Yes Type of Wheelchair: Manual Wheelchair activity did not occur: Refused         Wheelchair 50 feet with 2 turns  activity    Assist            Wheelchair 150 feet activity     Assist  Wheelchair 150 feet activity did not occur: Refused        Medical Problem List and Plan: 1.  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  Conservative care per neurosurgery.  -Continue CIR therapies including PT, OT, and SLP  2.  Antithrombotics: -DVT/anticoagulation:  Mechanical: Sequential compression devices, below knee Bilateral lower extremities             -antiplatelet therapy: Aspirin 81 mg daily resumed 3. Pain Management/Gout flare right foot: Tylenol as needed             -prednisone taper, avoid tight fitting shoes and socks  Improving 4. Mood: Celexa 10 mg daily, Namenda 28 mg nightly, Aricept 10 mg daily             -antipsychotic agents: N/A 5. Neuropsych: This patient is not capable of making decisions on her own behalf. 6. Skin/Wound Care: Routine skin checks             -eucerin  cream BLE 7. Fluids/Electrolytes/Nutrition: Routine in and outs 8.  CKD.  Creatinine baseline 1.03-1.39.    At baseline or better BUN improved but poor intake change IVF to nocturnal  9.  Diabetes mellitus.  Hemoglobin A1c 6.2.  Metformin 500 mg twice daily prior to admission and resume as needed.  CBG (last 3)  No results for input(s): GLUCAP in the last 72 hours.   10.  Klebsiella UTI.  Keflex completed.- recheck UA neg  11.  Hyperlipidemia.  Hold Zocor 20 mg daily secondary to elevated LFTs 12.  Orthostasis.  Currently on Inderal 20 mg twice daily as well as low-dose Norvasc 5 mg daily initiated 02/08/2021.     Vitals:   02/15/21 1942 02/16/21 0535  BP: (!) 148/94 (!) 167/62  Pulse: 76 78  Resp: 18 18  Temp: 98.4 F (36.9 C) 97.8 F (36.6 C)  SpO2: 93% 100%   Sitting BPs running about 54mmHg lower than lying - will ask nursing to check in sitting position to avoid orthostatic episodes 13. Elevated Transaminases.   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   Advanced to D3 thins, continue to advance as tolerated 15. Slow transit constipation  -prn suppository  -senna-s at HS  16.  Hx dementia on dual therapy , Baseline cognition limited higher level (no pill management or financial decisions, occ incont, sundowning with mild agitation) LOS: 6 days A FACE TO FACE EVALUATION WAS PERFORMED  Charlett Blake 02/16/2021, 9:05 AM

## 2021-02-16 NOTE — Progress Notes (Signed)
Speech Language Pathology Daily Session Note  Patient Details  Name: [name] MRN: [id number] Date of Birth: 04/08/1941  Today's Date: 02/16/2021 SLP Individual Time: 0930-1000 SLP Individual Time Calculation (min): 30 min  Short Term Goals: Week 1: SLP Short Term Goal 1 (Week 1): Patient will follow simple one step-directions utilizing compensatory strategies with max A multimodal cues SLP Short Term Goal 2 (Week 1): Patient will express wants/needs when presented with two verbal options with mod A verbal cues SLP Short Term Goal 3 (Week 1): Patient will attend to basic famliar tasks for 5 minute intervals with max A verbal cues for redirection SLP Short Term Goal 4 (Week 1): Patient will locate appropriate information on external aids to improve orientation to place/situation with max A multimodal cues  Skilled Therapeutic Interventions: Pt greeted in wheelchair and accompanied by her daughter this date. Pt agreeable to skilled ST intervention with focus on cognitive goals. SLP facilitated session by providing max A verbal and visual cues for color matching in field of 2. Pt eventually required max-to-total A due to internal distractions and limited sustained duration with active participation approximately 2 minute intervals prior to requiring max verbal redirection. SLP facilitated identification of information on large print calendar including month, year, and days of the week. Pt completed task with max A verbal and visual cues. Patient benefited from extended processing time, repetition of instructions, and cues for visual scanning during all therapeutic tasks performed this date. LTGs were modified accordingly due to slight decline in function and slow progression with cognitive functions . Patient was left in wheelchair with alarm activated and immediate needs within reach at end of session. Continue per current plan of care.      Pain Pain Assessment Pain Scale: 0-10 Pain Score: 0-No  pain  Therapy/Group: Individual Therapy  Patty Sermons 02/16/2021, 12:18 PM

## 2021-02-16 NOTE — Patient Care Conference (Signed)
Inpatient RehabilitationTeam Conference and Plan of Care Update Date: 02/16/2021   Time: 11:08 AM    Patient Name: [name]      Medical Record Number: [id number]  Date of Birth: [birth year] Sex: Female         Room/Bed: 4W10C/4W10C-01 Payor Info: Payor: MEDICARE / Plan: MEDICARE PART A AND B / Product Type: *No Product type* /    Admit Date/Time:  02/10/2021  2:51 PM  Primary Diagnosis:  Traumatic subdural hematoma  Hospital Problems: Principal Problem:   Traumatic subdural hematoma ([place]) Active Problems:   Transaminitis   Drug-induced liver injury    Expected Discharge Date: Expected Discharge Date: 03/02/21  Team Members Present: Physician leading conference: Dr. Alysia Penna Social Worker Present: Erlene Quan, BSW Nurse Present: Dorien Chihuahua, RN PT Present: Page Spiro, PT OT Present: Clyda Greener, OT SLP Present: Sherren Kerns, SLP PPS Coordinator present : Gunnar Fusi, SLP     Current Status/Progress Goal Weekly Team Focus  Bowel/Bladder   Pt incontinent of B/B. LBM 02/15/2021  Regular BMs every3 days or less  Assess B/B every shift and PRN   Swallow/Nutrition/ Hydration   Advanced to Dys 3, thin liquids - sup for swallowing, mod A for self feeding due to cognitive factors  sup A  tolerance of current diet   ADL's             Mobility   min/mod assist bed mobility using bed features, mod assist sit<>stands and stand pivot using RW, min/mod assist gait up to 33ft using RW - significant fluctuations in arousal affecting participation in therapy and assistance required to perform functional mobility tasks  CGA overall at ambulatory level  activity tolerance, bed mobility training, transfer training, gait training, dynamic standing balance, pt/family education; motor planning and initiation of functional mobility tasks; sustained attention to mobility tasks   Communication   max A  mod A  following simple 1-step commands   Safety/Cognition/  Behavioral Observations  max A  mod A  focused attention, following basic commands, using external aids to improve orientation   Pain   No S/Sx of pain  pain scale <3/10  Assess pain every shift and PRN   Skin   BL LE dried & scaley  No breakdown  Assess skin every shift and PRN     Discharge Planning:  Discharging home with assistance from daughter. Dtr potentially hiring assistance   Team Discussion: Hx of dementia with SDH. Patient needed assistance with medication and money management PTA; not taking meds consistently with sundowning PTA and multi falls.  Vaso-vagal episode followed by altered alertness workup completed with CT scan and UA. No significant changes noted on CT per MD.   Patient on target to meet rehab goals: Progress limited by confusion and limited participation; patient shuts down after a few minutes of therapy. Function inconsistent and impaired due to slow initiation of tasks, inattention to tasks, needs cues for simple tasks and visual gestures and responds slowly. Recommend supervision with meals and needs mod - max assit for self feeding due to mastication issues. Goals for discharge set for CGA overall  *See Care Plan and progress notes for long and short-term goals.   Revisions to Treatment Plan:  Working on carry over, 1 step commands, use of external aides for orientation, and focused attention.   Teaching Needs: Safety, medications, secondary risk management, transfers, toileting, etc.  Current Barriers to Discharge: Decreased caregiver support, Home enviroment access/layout, Incontinence, and Behavior  Possible Resolutions  to Barriers: Timed toileting/prompted toileting Family education with spouse and daughter Private duty facility list provided to family [place] follow up services     Medical Summary               I attest that I was present, lead the team conference, and concur with the assessment and plan of the team.   Margarito Liner 02/16/2021, 3:48 PM

## 2021-02-16 NOTE — Progress Notes (Signed)
Patient ID: [name], female   DOB: May 19, 1941, 80 y.o.   MRN: [id number] Team Conference Report to Patient/Family  Team Conference discussion was reviewed with the patient and caregiver, including goals, any changes in plan of care and target discharge date.  Patient and caregiver express understanding and are in agreement.  The patient has a target discharge date of 03/02/21.  SW met with pt and daughter at bedside. Provided team conference updates. Daughter requesting [place] resources. SW will provide [facility] and HC resources. Family education scheduled on October 12 8-12 AM.   Dyanne Iha 02/16/2021, 1:40 PM

## 2021-02-16 NOTE — Progress Notes (Signed)
Occupational Therapy TBI Note  Patient Details  Name: [name] MRN: [id number] Date of Birth: Oct 12, 1940  Today's Date: 02/16/2021 OT Individual Time: 1350-1431 OT Individual Time Calculation (min): 41 min    Short Term Goals: Week 1:  OT Short Term Goal 1 (Week 1): Pt will be able to don shirt with MIN A OT Short Term Goal 2 (Week 1): Pt will complete sit > stand with LRAD and MIN A in prep for LB dressing OT Short Term Goal 3 (Week 1): Pt will complete UB bathing with no more than MIN cuing OT Short Term Goal 4 (Week 1): Pt will complete transfer to raised toilet with MOD A  Skilled Therapeutic Interventions/Progress Updates:    Pt sitting in recliner to start with her sister-in-law present and her daughter just walking out the door.  When asked who just left, she was unable to recall that it was her daughter.  When asked who was in the room with her, she was also unable to state.  She was not oriented to place, time, or situation, and most of the time her verbal output did not answer the questions she was asked. She needed max assist for transfers from recliner to wheelchair and for transfer back using the RW for support.  She was able to complete grooming task of brushing her hair at the sink but needed max instructional cueing with min assist for thoroughness.  Took her down to the dayroom via wheelchair where she sat at the high/low table and attempted money management task.  When presented with a $20 bill, she was unable to state that this was money and could not state how much.  Downgraded task to having her try and pick up large 2-3" wooden pegs and place them in a peg board.  She needed initial max assist to place the first one and then was able to place the next couple without assist, just verbal cueing.  She would then get internally distracted, yelling out for her husband "Laverna Peace", with therapist re-directing her that he will be coming to see her later.  Decreased sustained attention  to task with max assist to complete 10 pegs.  Finished session with return to the room and max assist stand pivot transfer to the recliner.  Short step length noted with flexed knees and trunk.  Pt was left with family and with the call button and phone in reach and safety belt in place.    Therapy Documentation Precautions:  Precautions Precautions: Fall Precaution Comments: confused Restrictions Weight Bearing Restrictions: No  Pain: Pain Assessment Pain Scale: Faces Pain Score: 0-No pain Agitated Behavior Scale: TBI Observation Details Observation Environment: pt's room and dayroom Start of observation period - Date: 02/16/21 Start of observation period - Time: 1350 End of observation period - Date: 02/16/21 End of observation period - Time: 1431 Agitated Behavior Scale (DO NOT LEAVE BLANKS) Short attention span, easy distractibility, inability to concentrate: Present to a moderate degree Impulsive, impatient, low tolerance for pain or frustration: Present to a slight degree Uncooperative, resistant to care, demanding: Absent Violent and/or threatening violence toward people or property: Absent Explosive and/or unpredictable anger: Absent Rocking, rubbing, moaning, or other self-stimulating behavior: Present to a slight degree Pulling at tubes, restraints, etc.: Absent Wandering from treatment areas: Absent Restlessness, pacing, excessive movement: Absent Repetitive behaviors, motor, and/or verbal: Absent Rapid, loud, or excessive talking: Absent Sudden changes of mood: Absent Easily initiated or excessive crying and/or laughter: Absent Self-abusiveness, physical and/or verbal:  Absent Agitated behavior scale total score: 18  ADL: See Care Tool Section for some details of mobility and selfcare   Therapy/Group: Individual Therapy  Revanth Neidig OTR/L 02/16/2021, 4:17 PM

## 2021-02-16 NOTE — Plan of Care (Signed)
  Problem: RH Swallowing Goal: LTG Patient will consume least restrictive diet using compensatory strategies with assistance (SLP) Description: LTG:  Patient will consume least restrictive diet using compensatory strategies with assistance (SLP) Flowsheets (Taken 02/16/2021 1128) LTG: Pt Patient will consume least restrictive diet using compensatory strategies with assistance of (SLP): Supervision

## 2021-02-16 NOTE — Plan of Care (Signed)
  Problem: RH Cognition - SLP Goal: RH LTG Patient will demonstrate orientation with cues Description:  LTG:  Patient will demonstrate orientation to person/place/time/situation with cues (SLP)   Flowsheets (Taken 02/16/2021 1121) LTG: Patient will demonstrate orientation using cueing (SLP): Maximal Assistance - Patient 25 - 49% Note: Goal downgraded due to slow and inconsistent progress   Problem: RH Comprehension Communication Goal: LTG Patient will comprehend basic/complex auditory (SLP) Description: LTG: Patient will comprehend basic/complex auditory information with cues (SLP). Flowsheets (Taken 02/16/2021 1121) LTG: Patient will comprehend auditory information with cueing (SLP):  Moderate Assistance - Patient 50 - 74%  Maximal Assistance - Patient 25 - 49% Note: Goal downgraded due to slow and inconsistent progress   Problem: RH Expression Communication Goal: LTG Patient will verbally express basic/complex needs(SLP) Description: LTG:  Patient will verbally express basic/complex needs, wants or ideas with cues  (SLP) Flowsheets (Taken 02/16/2021 1121) LTG: Patient will verbally express basic/complex needs, wants or ideas (SLP):  Moderate Assistance - Patient 50 - 74%  Maximal Assistance - Patient 25 - 49% Note: Goal downgraded due to slow and inconsistent progress   Problem: RH Attention Goal: LTG Patient will demonstrate this level of attention during functional activites (SLP) Description: LTG:  Patient will will demonstrate this level of attention during functional activites (SLP) Flowsheets (Taken 02/16/2021 1121) Patient will demonstrate during cognitive/linguistic activities the attention type of:  Sustained  Focused LTG: Patient will demonstrate this level of attention during cognitive/linguistic activities with assistance of (SLP): Maximal Assistance - Patient 25 - 49% Note: Goal downgraded due to slow and inconsistent progress

## 2021-02-16 NOTE — Plan of Care (Signed)
  Problem: RH BOWEL ELIMINATION Goal: RH STG MANAGE BOWEL WITH ASSISTANCE Description: STG Manage Bowel with Assistance. Outcome: Not Progressing; incontinence   Problem: RH BLADDER ELIMINATION Goal: RH STG MANAGE BLADDER WITH ASSISTANCE Description: STG Manage Bladder With moD I Assistance Outcome: Not Progressing; incontinence   Problem: RH KNOWLEDGE DEFICIT Goal: RH STG INCREASE KNOWLEDGE OF DIABETES Description: Patient's family will be able to manage  DM with medications and dietary modifications using handouts and educational resources independently Outcome: Not Progressing; confused

## 2021-02-16 NOTE — Progress Notes (Signed)
Physical Therapy Session Note  Patient Details  Name: [name] MRN: [id number] Date of Birth: September 16, 1940  Today's Date: 02/16/2021 PT Individual Time: [phone] and [id number] PT Individual Time Calculation (min): 83 min and 49 min   Short Term Goals: Week 1:  PT Short Term Goal 1 (Week 1): Pt will remain out of bed > 2 hours between therapies PT Short Term Goal 2 (Week 1): Pt will perform bed mobility with min assist PT Short Term Goal 3 (Week 1): Pt will ambulate 129ft with min assist and LRAD PT Short Term Goal 4 (Week 1): Pt will be able to attend to task for >5 minutes without redirection  Skilled Therapeutic Interventions/Progress Updates:    Session 1: Pt received supine in bed, HOB elevated, with nursing staff present assisting with breakfast. Pt agreeable to therapy session therefore therapist assumed care of patient. Supine>sitting R EOB, HOB maximally elevated and using bedrail, with min assist for trunk upright - requires max cuing for initiation, sustained attention, and sequencing of task.  Sitting EOB, scooting forwards with min assist via bed pad and pt continuing to demo small, inefficient scoots. Donned B LE TED hose and shoes.  Sitting EOB with supervision for trunk control participated in meal consumption with pt only requiring physical assistance to eat 2x otherwise responsive to cuing to self-feed. - utilized red foam on utensils; however, pt noted to hold spoon sideways rather than upright causing her food to dump out (the foam doesn't give pt tactile feedback on spoon orientation like the handle does), tried w/o the foam and pt with improved orientation of utensils  - pt unable to sustain attention to eating requiring max cuing for redirection to task  - requires hand-over-hand cuing to initiate L hand involvement in bimanual tasks otherwise pt lies it passively in her lap - pt with difficulty visually scanning to locate items on tray and once in her line of sight  unable to identify them  - continues to have difficulty locating mouth with her utensils - continues to have difficulty understanding how a straw works with pt attempting to spoon beverage into mouth with straw  Pt continues to demonstrate significantly impaired communication this morning with pt mumbling incoherent phrases >85% of the session in response to therapist's questions - is able to clearly vocalize a few sentences that are more automatic.  Sit>stand slightly elevated EOB>RW with mod assist for lifting to stand and max multimodal cuing to push up from bed (not pull up on RW) - sustains trunk/hip flexed posture in slight crouch. Gait training ~53ft towards bathroom using RW with min assist for balance and AD management - pt starts small marching in place lacking forward advancement of steps - despite attempts at cuing pt unable to step forward due to motor planning impairments therefore requires manual facilitation to shift weight and initiate swing.   Provided w/c and transported remainder of distance into bathroom. L stand pivot w/c>BSC over toilet using grab bars with mod assist for lifting to stand and to facilitate full turn (total assist LB clothing management). Continent of bowel and bladder - standing with RW and min assist for balance during total assist peri-care. R stand pivot BSC over toilet>w/c using RW with mod assist of 1 for AD management and balance - max multimodal cuing for stepping and sequencing to turn fully prior to sitting. Pt left seated in w/c with her daughter present - needs in reach and seat belt alarm on.   Session 2:  Pt received sitting in recliner with her friend visiting and pt agreeable to therapy session. Pt continues to demonstrate significant impairments in comprehension of 1-step cuing despite multimodal cues used; along with significantly impaired focused and sustained attention, initiation, awareness, and motor planning. Due to this, pt requires repeated  cuing, significantly increased time to process, motor plan, and execute motor tasks throughout session.   Sit>stand recliner>RW with mod assist for lifting to stand - max multimodal cuing to push up on armrests to come to stand (not to pull on RW) and to bring feet back under her BOS - facilitation for anterior trunk lean/weight shift.  Gait training ~39ft using RW with min assist - maintains trunk/hip flexed posture with slight crouch - pt starts small marching in place without forward advancement of feet, not responsive to verbal nor visual cuing therefore therapist providing manual facilitation for weight shifting onto stance limb and for initiation swing - continues to have significantly decreased foot clearance and step length along with slow movements (when facilitation stopped, pt unable to ambulate forward). Therapist provided pt with w/c for seated break then performed additional ~38ft of gait training.  L stand pivot w/c>recliner using RW with pt having more difficulty motor planning this task due to proximity of w/c to recliner and pt reaching for recliner seat as opposed to standing and turning hips towards the seat - requires manual facilitation to rotate hips and ensure landing safely in recliner. Pt fatigued at end of session starting to fall asleep - left in recliner with needs in reach, B LEs elevated, and seat belt alarm on.   Therapy Documentation Precautions:  Precautions Precautions: Fall Precaution Comments: lethargic, confused Restrictions Weight Bearing Restrictions: No   Pain:  Session 1: Pt moans throughout session indicating likely discomfort with pt continuing to report discomfort to touch in bilateral lower legs and feet. Pt unable to explain reason for moaning despite therapist's questions.  Session 2: Pt moans throughout session indicating likely discomfort but pt unable to clarify when asked questions; however, appears the discomfort is likely located in her feet.     Therapy/Group: Individual Therapy  Tawana Scale , PT, DPT, NCS, CSRS 02/16/2021, 7:55 AM

## 2021-02-17 MED ORDER — AMMONIUM LACTATE 12 % EX LOTN
TOPICAL_LOTION | CUTANEOUS | Status: DC | PRN
  Filled 2021-02-17: qty 225

## 2021-02-17 NOTE — Progress Notes (Signed)
Occupational Therapy TBI Note  Patient Details  Name: [name] MRN: [id number] Date of Birth: 1941-02-12  Today's Date: 02/17/2021 OT Individual Time: 0321-2248 OT Individual Time Calculation (min): 55 min    Short Term Goals: Week 1:  OT Short Term Goal 1 (Week 1): Pt will be able to don shirt with MIN A OT Short Term Goal 2 (Week 1): Pt will complete sit > stand with LRAD and MIN A in prep for LB dressing OT Short Term Goal 3 (Week 1): Pt will complete UB bathing with no more than MIN cuing OT Short Term Goal 4 (Week 1): Pt will complete transfer to raised toilet with MOD A  Skilled Therapeutic Interventions/Progress Updates:    Pt in bed to start session with her daughter present.  She was able to transfer to the EOB with mod assist overall and then to the wheelchair stand pivot at max assist.  Pt with decreased awareness of place, time, or situation.  She was able to then work on simple grooming tasks at the sink.  Decreased initiation of washing her face, hands, or brushing her hair.  She would hold the washcloth when given to her with max instructional cueing needed to complete partial washing of face.  At times, therapist had to provide hand over hand assist for initiation and completing of washing hands as well as brushing her hair secondary to decreased focused attention.  Garbled speech throughout session with pt being asked to repeat statements, but she was unable to repeat.  Daughter reports pt declining just prior to admission, needing more cueing for completion of selfcare tasks, but not to the extent of her current level now.  Finished tasks at the sink with total assist for donning pants. She was unable to sequence or assist.  She transferred to the recliner at max assist stand pivot with increased fear and yelling out when therapist was assisting her.  Call button and phone in reach with safety alarm belt in place.   Therapy Documentation Precautions:   Precautions Precautions: Fall Precaution Comments: confusion, decreased awareness and decreased initiation Restrictions Weight Bearing Restrictions: No  Pain:  No report of pain  Agitated Behavior Scale: TBI Observation Details Observation Environment: in pt room Start of observation period - Date: 02/17/21 Start of observation period - Time: 0903 End of observation period - Date: 02/17/21 End of observation period - Time: 0958 Agitated Behavior Scale (DO NOT LEAVE BLANKS) Short attention span, easy distractibility, inability to concentrate: Present to a moderate degree Impulsive, impatient, low tolerance for pain or frustration: Present to a slight degree Uncooperative, resistant to care, demanding: Present to a slight degree Violent and/or threatening violence toward people or property: Absent Explosive and/or unpredictable anger: Absent Rocking, rubbing, moaning, or other self-stimulating behavior: Absent Pulling at tubes, restraints, etc.: Absent Wandering from treatment areas: Absent Restlessness, pacing, excessive movement: Absent Repetitive behaviors, motor, and/or verbal: Absent Rapid, loud, or excessive talking: Absent Sudden changes of mood: Absent Easily initiated or excessive crying and/or laughter: Absent Self-abusiveness, physical and/or verbal: Absent Agitated behavior scale total score: 18  ADL: See Care Tool Section for some details of mobility and selfcare   Therapy/Group: Individual Therapy  Ikhlas Albo OTR/L 02/17/2021, 4:29 PM

## 2021-02-17 NOTE — Progress Notes (Signed)
Physical Therapy Weekly Progress Note  Patient Details  Name: [name] MRN: [id number] Date of Birth: February 05, 1941  Beginning of progress report period: February 11, 2022 End of progress report period: February 17, 2022  Patient has met 1 of 4 short term goals.  [name] is making slow progress with therapy due to fluctuating levels of arousal and significant cognitive impairments resulting in impaired awareness, processing, initiation, motor planning, and execution of functional mobility tasks. Pt requires max, repeated cuing to perform basic functional sit<>stand and stand pivot transfers along with short distance gait up to ~59f using RW and varying mod to max assist. Pt also demonstrates generalized weakness, impaired balance, and decreased activity tolerance. Due to these impairments pt requires significantly increased time to complete simple tasks. Pt will benefit from continued CIR level therapies with plan to D/C home with 24hr support.  Patient continues to demonstrate the following deficits muscle weakness, decreased cardiorespiratoy endurance, impaired timing and sequencing, unbalanced muscle activation, motor apraxia, and decreased motor planning,  , decreased initiation, decreased attention, decreased awareness, decreased problem solving, decreased safety awareness, decreased memory, and delayed processing, and decreased sitting balance, decreased standing balance, decreased postural control, and decreased balance strategies and therefore will continue to benefit from skilled PT intervention to increase functional independence with mobility.  Patient progressing toward long term goals..  Continue plan of care.  PT Short Term Goals Week 1:  PT Short Term Goal 1 (Week 1): Pt will remain out of bed > 2 hours between therapies PT Short Term Goal 1 - Progress (Week 1): Met PT Short Term Goal 2 (Week 1): Pt will perform bed mobility with min assist PT Short Term Goal 2 - Progress (Week  1): Progressing toward goal PT Short Term Goal 3 (Week 1): Pt will ambulate 1017fwith min assist and LRAD PT Short Term Goal 3 - Progress (Week 1): Progressing toward goal PT Short Term Goal 4 (Week 1): Pt will be able to attend to task for >5 minutes without redirection PT Short Term Goal 4 - Progress (Week 1): Progressing toward goal Week 2:  PT Short Term Goal 1 (Week 2): Pt will perform supine<>sit with min assist >75% of the time PT Short Term Goal 2 (Week 2): Pt will perform sit<>stands using LRAD with min assist >75% of the time PT Short Term Goal 3 (Week 2): Pt will perform bed<>chair transfers using LRAD with min assist >75% of the time PT Short Term Goal 4 (Week 2): Pt will ambulate at least 3021fsing LRAD with min assist of 1 PT Short Term Goal 5 (Week 2): Pt will progress to requiring only mod cuing to initiate and motor plan basic functional transfers  Skilled Therapeutic Interventions/Progress Updates:  Ambulation/gait training;Balance/vestibular training;Cognitive remediation/compensation;Disease management/prevention;Discharge planning;Community reintegration;DME/adaptive equipment instruction;Functional electrical stimulation;Functional mobility training;Patient/family education;Pain management;Neuromuscular re-education;Psychosocial support;Skin care/wound management;Splinting/orthotics;Therapeutic Exercise;Therapeutic Activities;Stair training;UE/LE Strength taining/ROM;UE/LE Coordination activities;Visual/perceptual remediation/compensation;Wheelchair propulsion/positioning   Therapy Documentation Precautions:  Precautions Precautions: Fall Precaution Comments: confusion, decreased awareness and decreased initiation Restrictions Weight Bearing Restrictions: No  CarTawana ScalePT, DPT, NCS, CSRS 02/17/2021, 7:09 PM

## 2021-02-17 NOTE — Progress Notes (Signed)
PHYSICAL MEDICINE & REHABILITATION PROGRESS NOTE  Subjective/Complaints:   ROS: Patient denies CP, SOB, N/V/D  Objective: Vital Signs: Blood pressure (!) 129/44, pulse 74, temperature 99.3 F (37.4 C), temperature source Oral, resp. rate 16, height 5' 2.01" (1.575 m), weight 57.8 kg, SpO2 94 %. CT HEAD WO CONTRAST (5MM)  Result Date: 02/15/2021 CLINICAL DATA:  80 year old female with history of subdural hemorrhage, follow-up evaluation. EXAM: CT HEAD WITHOUT CONTRAST TECHNIQUE: Contiguous axial images were obtained from the base of the skull through the vertex without intravenous contrast. COMPARISON:  Multiple prior studies most recent from February 03, 2021. FINDINGS: Brain: Stable ventricular size, no signs of midline shift. Mixed attenuation and diminished volume of subdural hematoma along the falx. Small extra-axial collection along the RIGHT parieto-occipital convexity predominantly intermediate density with some mixed attenuation seen posteriorly measuring approximately 7 mm. No additional new extra-axial collections. Vascular: No hyperdense vessel or unexpected calcification. Skull: Normal. Negative for fracture or focal lesion. Sinuses/Orbits: Visualized paranasal sinuses and orbits are unremarkable. Other: None IMPRESSION: Mixed attenuation and diminished volume of subdural hematoma along the falx. New subdural with predominantly low attenuation along the RIGHT parieto-occipital convexity measuring up to 0.7 cm, potentially redistribution of previously identified subdural hematoma along the falx given density and diminished volume of hematoma about the falx when compared to previous imaging. No signs of midline shift with stable ventricular size. These results will be called to the ordering clinician or representative by the Radiologist Assistant, and communication documented in the PACS or [employer]. Electronically Signed   By: Zetta Bills M.D.   On: 02/15/2021 12:01    No results for input(s): WBC, HGB, HCT, PLT in the last 72 hours.  No results for input(s): NA, K, CL, CO2, GLUCOSE, BUN, CREATININE, CALCIUM in the last 72 hours.   Intake/Output Summary (Last 24 hours) at 02/17/2021 0539 Last data filed at 02/17/2021 0818 Gross per 24 hour  Intake 268 ml  Output --  Net 268 ml      Pressure Injury 02/15/21 Buttocks Left Stage 1 -  Intact skin with non-blanchable redness of a localized area usually over a bony prominence. (Active)  02/15/21 1544  Location: Buttocks  Location Orientation: Left  Staging: Stage 1 -  Intact skin with non-blanchable redness of a localized area usually over a bony prominence.  Wound Description (Comments):   Present on Admission:     Physical Exam: BP (!) 129/44 (BP Location: Right Arm)   Pulse 74   Temp 99.3 F (37.4 C) (Oral)   Resp 16   Ht 5' 2.01" (1.575 m)   Wt 57.8 kg   SpO2 94%   BMI 23.30 kg/m    General: No acute distress Mood and affect are appropriate Heart: Regular rate and rhythm no rubs murmurs or extra sounds Lungs: Clear to auscultation, breathing unlabored, no rales or wheezes Abdomen: Positive bowel sounds, soft nontender to palpation, nondistended Extremities: No clubbing, cyanosis, or edema Skin: No evidence of breakdown, no evidence of rash, + stasis dermatitis  Neuro: delayed processing. Impaired insight and awareness, oriented to person only  Follows simple commands.   Motor: UE 4-/5.  RLE: Hip flexion, knee extension 3+/5, ankle dorsiflexion 4/5 LLE: 3 -/5 proximal distal  Assessment/Plan: 1. Functional deficits which require 3+ hours per day of interdisciplinary therapy in a comprehensive inpatient rehab setting. Physiatrist is providing close team supervision and 24 hour management of active medical problems listed below. Physiatrist and rehab team continue to assess barriers  to discharge/monitor patient progress toward functional and medical goals   Care Tool:  Bathing     Body parts bathed by patient: Face, Right arm, Left arm, Chest, Abdomen   Body parts bathed by helper: Abdomen, Front perineal area, Buttocks, Right upper leg, Left upper leg, Right lower leg, Left lower leg Body parts n/a: Buttocks, Front perineal area   Bathing assist Assist Level: Maximal Assistance - Patient 24 - 49%     Upper Body Dressing/Undressing Upper body dressing   What is the patient wearing?: Pull over shirt    Upper body assist Assist Level: Maximal Assistance - Patient 25 - 49%    Lower Body Dressing/Undressing Lower body dressing      What is the patient wearing?: Pants     Lower body assist Assist for lower body dressing: Total Assistance - Patient < 25%     Toileting Toileting    Toileting assist Assist for toileting: Dependent - Patient 0%     Transfers Chair/bed transfer  Transfers assist     Chair/bed transfer assist level: Maximal Assistance - Patient 25 - 49% Chair/bed transfer assistive device: Armrests, Programmer, multimedia   Ambulation assist      Assist level: Minimal Assistance - Patient > 75% Assistive device: Walker-rolling Max distance: 29ft   Walk 10 feet activity   Assist  Walk 10 feet activity did not occur: Safety/medical concerns        Walk 50 feet activity   Assist Walk 50 feet with 2 turns activity did not occur: Safety/medical concerns         Walk 150 feet activity   Assist Walk 150 feet activity did not occur: Safety/medical concerns         Walk 10 feet on uneven surface  activity   Assist Walk 10 feet on uneven surfaces activity did not occur: Safety/medical concerns         Wheelchair     Assist Is the patient using a wheelchair?: Yes Type of Wheelchair: Manual Wheelchair activity did not occur: Refused         Wheelchair 50 feet with 2 turns activity    Assist            Wheelchair 150 feet activity     Assist  Wheelchair 150 feet activity did  not occur: Refused        Medical Problem List and Plan: 1.  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  Conservative care per neurosurgery.  -Continue CIR therapies including PT, OT, and SLP , ELOS 10/12 2.  Antithrombotics: -DVT/anticoagulation:  Mechanical: Sequential compression devices, below knee Bilateral lower extremities             -antiplatelet therapy: Aspirin 81 mg daily resumed 3. Pain Management/Gout flare right foot: Tylenol as needed             -prednisone taper, avoid tight fitting shoes and socks  Improving 4. Mood: Celexa 10 mg daily, Namenda 28 mg nightly, Aricept 10 mg daily             -antipsychotic agents: N/A 5. Neuropsych: This patient is not capable of making decisions on her own behalf. 6. Skin/Wound Care: Routine skin checks             -eucerin cream BLE 7. Fluids/Electrolytes/Nutrition: Routine in and outs 8.  CKD.  Creatinine baseline 1.03-1.39.    At baseline or better BUN improved but poor intake change IVF  to nocturnal  9.  Diabetes mellitus.  Hemoglobin A1c 6.2.  Metformin 500 mg twice daily prior to admission and resume as needed.  CBG (last 3)  No results for input(s): GLUCAP in the last 72 hours.   10.  Klebsiella UTI.  Keflex completed.- recheck UA neg  11.  Hyperlipidemia.  Hold Zocor 20 mg daily secondary to elevated LFTs 12.  Orthostasis.  Currently on Inderal 20 mg twice daily as well as low-dose Norvasc 5 mg daily initiated 02/08/2021.     Vitals:   02/16/21 2026 02/17/21 0447  BP: (!) 137/58 (!) 129/44  Pulse: 70 74  Resp: 18 16  Temp: 97.6 F (36.4 C) 99.3 F (37.4 C)  SpO2: 98% 94%   Controlled 9/29 13. Elevated Transaminases.   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   Advanced to D3 thins, continue to advance as tolerated 15. Slow transit constipation  -prn suppository  -senna-s at HS  16.  Hx dementia on dual therapy , Baseline cognition limited higher level (no pill  management or financial decisions, occ incont, sundowning with mild agitation) LOS: 7 days A FACE TO FACE EVALUATION WAS PERFORMED  Charlett Blake 02/17/2021, 8:23 AM

## 2021-02-17 NOTE — Progress Notes (Signed)
Physical Therapy Session Note  Patient Details  Name: [name] MRN: [id number] Date of Birth: Dec 19, 1940  Today's Date: 02/17/2021 PT Individual Time: 8841-6606 and  1659-1800 PT Individual Time Calculation (min): 70 min  and 61 min   Short Term Goals: Week 1:  PT Short Term Goal 1 (Week 1): Pt will remain out of bed > 2 hours between therapies PT Short Term Goal 2 (Week 1): Pt will perform bed mobility with min assist PT Short Term Goal 3 (Week 1): Pt will ambulate 133ft with min assist and LRAD PT Short Term Goal 4 (Week 1): Pt will be able to attend to task for >5 minutes without redirection  Skilled Therapeutic Interventions/Progress Updates:    Session 1: Pt received sitting in recliner with her daughter, [name], present reporting that pt has been sleepy/tired today. Despite this, pt agreeable to therapy session. Nurse present to disconnect IV for therapy session. Pt continues to demonstrate significant impairments in comprehension of 1-step cuing despite multimodal cues used; along with significantly impaired focused and sustained attention, initiation, awareness, and motor planning. Due to this, pt requires repeated, max cuing along with significantly increased time to process, motor plan, and execute motor tasks throughout session. Pt responds well to cues from her daughter with increased focused attention on what her daughter says.  Sitting in recliner, scoots towards front of seat with mod assist for shifting pelvis - cuing for head/hips relationship and sequencing as pt with very inefficient scoots leaning only slightly side to side and not really shifting hips forward. Sit>stand recliner>RW requiring max, repeated cuing to push up from armrests as opposed to pulling up on RW - light mod assist for lifting to stand.   Gait training ~64ft to room door using RW with pt's daughter providing w/c follow for safety - min assist for balance - pt continues to demo significant motor planning  impairments often shifting weight back and forth on feet "marching" in place but without really lifting feet off the floor - unable to improve with verbal/visual/tactile cuing therefore requires manual facilitation for increased weight shifting and to advance R LE during swing.   Pt noted to be incontinent of bladder. Gait training ~32ft 2x in/out of bathroom using RW with pt's daughter present to provide w/c if needed - continues to require heavy min assist for balance along with cuing and manual facilitation as just described. Of note, pt became emotional while walking today spontaneously starting to cry and state "I can't" but with words of encouragement pt's mood improved and she was able to continue with task (pt's daughter provides emotional support, which significantly boosts pt's mood). Continent of bladder. Standing using B UE support on RW with min assist for balance during total assist LB clothing management and peri-care.   Sit>stand recliner>RW with light mod assist for lifting to stand (cuing to achieve full upright opposed to crouched posture). Standing with min assist for balance while completing dual-cognitive task of pointing to body parts on pt's doll (arm and hair) with pt unable to locate these while standing - transition to sitting and pt able to correctly locate 1 of 2. At end of session pt left seated in recliner with needs in reach, seat belt alarm on, and B LEs elevated.     Session 2: Pt received sitting in recliner with B LEs elevated and initially not agreeable to therapy session due to perseverating on when her husband is arriving, but with redirection and encouragement, is agreeable. Pt  noted to be soaked in water on along her L arm and L side near IV site - nurse notified and present to assess. Sitting in recliner, doffed shirt total assist and then pt starts attempting to remove pants while sitting with poor awareness and problem solving/motor planning to realize she needs to  stand to doff them. Sit>stand recliner>RW with mod assist for lifting and balance - pt continues to require max, repeated cuing to focus on task, initiate it, and execute it requiring significantly increased time - continues to try and pull up on RW despite repeated hand-over-hand cuing to push up on armrests. Standing with mod assist for balance due to posterior lean while doffing wet pants total assist. Sitting in recliner donned night gown with total assist for orienting clothing and threading on UEs. Noted pt had not voided since lunch time.  R stand pivot recliner>w/c, attempted without RW as pt tends to crouch down and reach for armrests on seat instead of using RW support anyways; however, this required significantly more assistance compared to when using RW - max assist for lifting to stand and for balance while turning - pt continues to lack initiation of weight shifting and stepping feet to complete transfer, requiring light manual facilitation.  Transported in/out of bathroom in w/c. L stand pivot w/c>BSC over toilet attempting to use grab bar support instead of RW as pt tends to reach for grab bar during the toilet transfers; however, pt with significant break down in motor planning requiring max assist to complete this transfer even after >5 minutes of repeated, max cuing for sequencing.  Continent of bladder. Sit>stand BSC over toilet>RW with mod assist for lifting to stand and for balance during total assist peri-care and LB clothing management. R stand pivot to w/c using RW with mod assist for balance along with light manual facilitation for weight shifting onto stance limb to prompt taking a step (requires manual facilitation for improved stepping with R LE). Nursing staff requested pt return to bed. R stand pivot w/c>EOB using RW as just described. Pt left seated EOB in the care of nursing staff.   Therapy Documentation Precautions:  Precautions Precautions: Fall Precaution Comments:  confused Restrictions Weight Bearing Restrictions: No   Pain:  Session 1: Continues to have sensitivity to touch on B lower legs and feet especially with donning TED hose and shoes - therapist donned these gently for pt comfort.   Session 2: Continues to moan during session indicating possible discomfort, but due to cognitive impairments unable to determine. Continues to have sensitivity to touch on B lower legs and feet.    Therapy/Group: Individual Therapy  Tawana Scale , PT, DPT, NCS, CSRS 02/17/2021, 7:57 AM

## 2021-02-17 NOTE — Progress Notes (Deleted)
Occupational Therapy Session Note  Patient Details  Name: [name] MRN: [id number] Date of Birth: 06-30-1940  Today's Date: 02/17/2021 OT Individual Time: 3734-2876 OT Individual Time Calculation (min): 55 min    Short Term Goals: Week 1:  OT Short Term Goal 1 (Week 1): Pt will be able to don shirt with MIN A OT Short Term Goal 2 (Week 1): Pt will complete sit > stand with LRAD and MIN A in prep for LB dressing OT Short Term Goal 3 (Week 1): Pt will complete UB bathing with no more than MIN cuing OT Short Term Goal 4 (Week 1): Pt will complete transfer to raised toilet with MOD A  Skilled Therapeutic Interventions/Progress Updates:    Pt in bed to start session with her daughter present.  She was able to transfer to the EOB with mod assist overall and then to the wheelchair stand pivot at max assist.  Pt with decreased awareness of place, time, or situation.  She was able to then work on simple grooming tasks at the sink.  Decreased initiation of washing her face, hands, or brushing her hair.  She would hold the washcloth when given to her with max instructional cueing needed to complete partial washing of face.  At times, therapist had to provide hand over hand assist for initiation and completing of washing hands as well as brushing her hair secondary to decreased focused attention.  Garbled speech throughout session with pt being asked to repeat statements, but she was unable to repeat.  Daughter reports pt declining just prior to admission, needing more cueing for completion of selfcare tasks, but not to the extent of her current level now.  Finished tasks at the sink with total assist for donning pants. She was unable to sequence or assist.  She transferred to the recliner at max assist stand pivot with increased fear and yelling out when therapist was assisting her.  Call button and phone in reach with safety alarm belt in place.    Therapy Documentation Precautions:   Precautions Precautions: Fall Precaution Comments: confusion, decreased awareness and decreased initiation Restrictions Weight Bearing Restrictions: No  Pain: Pain Assessment Pain Scale: 0-10 Pain Score: 0-No pain Faces Pain Scale: Hurts a little bit Pain Type: Acute pain Pain Location: Generalized Pain Descriptors / Indicators: Moaning Pain Onset: With Activity Pain Intervention(s): Repositioned;Emotional support ADL: See Care Tool Section for some details of mobility and selfcare   Therapy/Group: Individual Therapy  Adama Ferber OTR/L 02/17/2021, 12:54 PM

## 2021-02-17 NOTE — Progress Notes (Signed)
Speech Language Pathology Daily Session Note  Patient Details  Name: [name] MRN: [id number] Date of Birth: 06-15-1940  Today's Date: 02/17/2021 SLP Individual Time: 1445-1530 SLP Individual Time Calculation (min): 45 min  Short Term Goals: Week 1: SLP Short Term Goal 1 (Week 1): Patient will follow simple one step-directions utilizing compensatory strategies with max A multimodal cues SLP Short Term Goal 2 (Week 1): Patient will express wants/needs when presented with two verbal options with mod A verbal cues SLP Short Term Goal 3 (Week 1): Patient will attend to basic famliar tasks for 5 minute intervals with max A verbal cues for redirection SLP Short Term Goal 4 (Week 1): Patient will locate appropriate information on external aids to improve orientation to place/situation with max A multimodal cues  Skilled Therapeutic Interventions: Pt received sleeping upright in recliner chair and easy to rouse with verbal stimulation. Pt agreeable to skilled ST intervention with focus on cognitive goals. Patient displayed improved alertness with sustained attention of approximately 3-5 minute intervals this date. verbal output, and speech intelligibility this date and was able to engage in very simply conversational exchange and make her personal needs and preferences known by responding to open ended questions and by being given field of multiple choices. (E.g., patient initiated request for coffee and cream). SLP facilitated session by providing max A verbal/visual/tactile cueing including hand-over-hand to execute 1-step commands. Pt known to follow commands with greater success when given carrier phrase "show me." Pt located information on external orientation aids with max-to-total A and appears to exhibit either visual processing and/or reading deficits impacting efficiency and accuracy. Patient was left in chair with alarm activated and immediate needs within reach at end of session. Continue per  current plan of care.      Pain Pain Assessment Pain Scale: 0-10 Pain Score: 0-No pain  Therapy/Group: Individual Therapy  Patty Sermons 02/17/2021, 5:28 PM

## 2021-02-18 MED ORDER — LIDOCAINE 5 % EX OINT
TOPICAL_OINTMENT | Freq: Every day | CUTANEOUS | Status: DC | PRN
  Filled 2021-02-18: qty 35.44

## 2021-02-18 NOTE — Progress Notes (Signed)
Speech Language Pathology Weekly Progress and Session Note  Patient Details  Name: [name] MRN: [id number] Date of Birth: 11/23/1940  Beginning of progress report period: February 11, 2021 End of progress report period: February 18, 2018  Today's Date: 02/18/2021 SLP Individual Time: 1430-1500 SLP Individual Time Calculation (min): 30 min  Short Term Goals: Week 1: SLP Short Term Goal 1 (Week 1): Patient will follow simple one step-directions utilizing compensatory strategies with max A multimodal cues SLP Short Term Goal 1 - Progress (Week 1): Met SLP Short Term Goal 2 (Week 1): Patient will express wants/needs when presented with two verbal options with mod A verbal cues SLP Short Term Goal 2 - Progress (Week 1): Met SLP Short Term Goal 3 (Week 1): Patient will attend to basic famliar tasks for 5 minute intervals with max A verbal cues for redirection SLP Short Term Goal 3 - Progress (Week 1): Not met SLP Short Term Goal 4 (Week 1): Patient will locate appropriate information on external aids to improve orientation to place/situation with max A multimodal cues SLP Short Term Goal 4 - Progress (Week 1): Not met  New Short Term Goals: Week 2: SLP Short Term Goal 1 (Week 2): Patient will follow simple one step-directions during 25% of occasions utilizing compensatory strategies with max A verbal/visual cues SLP Short Term Goal 2 (Week 2): Patient will attend to basic and/or famliar tasks for 3 minute intervals with max A verbal cues for redirection SLP Short Term Goal 3 (Week 2): Patient will locate appropriate information on external aids to improve orientation to place/situation with max A multimodal cues SLP Short Term Goal 4 (Week 2): Pt will consume PO trials of regular textures with effective mastication and oral clearance and without overt s/sx of aspiration prior to consideration of diet advancement.  Weekly Progress Updates: Patient has made slow and inconsistent progress  with ST over the past week and has met 2 of 4 short term goals this reporting period. Progress has been limited secondary to fluctuating alertness, participation, and exacerbation of cognitive-communication deficits with hx of baseline dementia. Pt presents with severe-to-profound cognitive communication deficits impacting orientation, focused attention, working memory, awareness, and and functional communication. Pt is currently completing cognitive tasks with max-to-total A multimodal cues. She inconsistently follows basic 1-step commands max A using multimodal communication including consistent verbal reinforcement/repetition, direct clinician modeling, and extended processing time. Pt exhibits significantly reduced focused attention and diminished working memory. Despite these challenges, pt shows improvement in alertness, ability to make needs known when given field of choices and prompting, and tolerance of Dys 2 diet and thin liquids with minimal-to-no overt s/sx of aspiration. Pt displays improved ability to self feed when given full supervision to promote attention to task, sequencing, and placement of adaptive equipment.  Patient/family education is ongoing. Recommend continued skilled ST intervention to maximize cognitive-communication function and functional independence prior to discharge.    Intensity: Minumum of 1-2 x/day, 30 to 90 minutes Frequency: 3 to 5 out of 7 days Duration/Length of Stay: 10/12 Treatment/Interventions: Environmental controls;Internal/external aids;Cognitive remediation/compensation;Cueing hierarchy;Dysphagia/aspiration precaution training;Functional tasks;Patient/family education   Daily Session Skilled Therapeutic Interventions: Pt received sleeping in bed and easily roused to verbal stimulation. Greeted therapist by saying "thank you!" And was pleasantly confused throughout session. Pt agreeable to skilled ST intervention with focus on cognitive goals. SLP  facilitated session by providing max A multimodal cueing for following 1 step commands during approximately 10% of occasions. Often times pt stared at therapist and  expressed "what what we doing?". SLP presented field of 2 objects and asked pt to identify object based on feature. For example, presented pt with a ball and cup and asked "which one do we drink with?" Pt responded accurately during 2/4 occasions with max A multimodal cues and extended processing time. Pt required prompts to be repeated at least 5-6 times for comprehension and to focus attention. Pt displayed external distractibility to her blanket and was able to describe how the blanket felt given field of 2 choices (is it soft or scratchy?) otherwise most verbal responses were "I don't know" or asking where she was at. External orientation aids did not appear beneficial in improving orientation to environment and diminished working memory impacts retention of this information for >30 seconds. Patient was left in bed with alarm activated and immediate needs within reach at end of session. Continue per current plan of care.     General    Pain PAINAD (Pain Assessment in Advanced Dementia) Breathing: normal Negative Vocalization: none Facial Expression: smiling or inexpressive Body Language: relaxed Consolability: no need to console PAINAD Score: 0  Therapy/Group: Individual Therapy  Patty Sermons 02/18/2021, 4:42 PM

## 2021-02-18 NOTE — Progress Notes (Signed)
Occupational Therapy Weekly Progress Note  Patient Details  Name: [name] MRN: [id number] Date of Birth: 1940-09-19  Beginning of progress report period: February 11, 2021 End of progress report period: February 18, 2021  Today's Date: 02/18/2021 OT Individual Time: 0623-7628 OT Individual Time Calculation (min): 40 min    Patient has met 0 of 4 short term goals.  Pt is making slow progress with OT at this time.  She demonstrates decreased sustained attention to all tasks at this time, requiring max assist for self feeding and max to total assist for all bathing and dressing secondary to limited attention.  She is able to complete sit to stand with up to mod assist when attending to task with mod to max for transfers secondary to flexed posture and short step length.  Ms. [name] only limited amounts and will hum or moan repetitively while sitting.  When asked if she is in pain she replies "no" but with helping here move to the edge or back of the chair she will softly yell out as if she does hurt.  Overall, she has made very little progress the past week and is actually more impaired now with attention and initiation compared to eval.  Feel she will benefit from continued CIR level therapy at this time but based on this weeks progress coming up, goals may need to be downgraded.    Patient continues to demonstrate the following deficits: muscle weakness, impaired timing and sequencing, unbalanced muscle activation, motor apraxia, and decreased motor planning, decreased initiation, decreased attention, decreased awareness, decreased problem solving, decreased safety awareness, decreased memory, and delayed processing, and decreased standing balance, decreased postural control, and decreased balance strategies and therefore will continue to benefit from skilled OT intervention to enhance overall performance with BADL.  Patient not currently progressing toward long term goals will  continue to monitor over this week and adjust if needed next week.  Continue plan of care.  OT Short Term Goals Week 2:  OT Short Term Goal 1 (Week 2): Pt will maintain sustained attention for greater than 5 mins during selfcare tasks with no more than mod instructional cueing. OT Short Term Goal 2 (Week 2): Pt will complete at least 2 grooming tasks in sitting with no more than min assist. OT Short Term Goal 3 (Week 2): Pt complete toilet transfer with mod assist consistently using the RW and 3:1. OT Short Term Goal 4 (Week 2): Pt will complete UB dressing with min assist for donning pullover shirt.  Skilled Therapeutic Interventions/Progress Updates:    Pt in recliner to start with eyes closed.  Max stimulation needed for pt to open and maintain for greater than 1 min.  Decreased initiation throughout session with attempt to change urine soaked brief, wash peri area, and complete dressing.  She needed total assist for removal of brief, with mod assist for initiation and completion of sit to stand.  Total assist for washing front and back peri area as well as donning new brief and pants.  While sitting she would hold the pants and lean forward, but then would stop, not reaching down to her feet.  When therapist would try to help lift her LE up for her to reach, she still would not initiate further.  While standing, therapist placed her right hand on her pants and she initiated holding them with slight attempt to pull up briefly before stopping.  Therapist assisted the rest of the way.  She was able to assist  with donning the left arm of her shirt, after therapist assisted with treading her right arm secondary to the IV.  Still overall needing max assist to complete however.  Finished session with pt in the bed and with the call button and phone in reach and safety belt in place.    Therapy Documentation Precautions:  Precautions Precautions: Fall Precaution Comments: confusion, decreased awareness  and decreased initiation Restrictions Weight Bearing Restrictions: No  Pain: Pain Assessment Pain Scale: Faces Pain Score: 0-No pain ADL: See Care Tool Section for some details of mobility and selfcare   Therapy/Group: Individual Therapy  Emmakate Hypes OTR/L 02/18/2021, 12:34 PM

## 2021-02-18 NOTE — Progress Notes (Addendum)
[place] PHYSICAL MEDICINE & REHABILITATION PROGRESS NOTE  Subjective/Complaints:  Pt in bed no issues this am remains oriented x 1   ROS: Patient denies CP, SOB, N/V/D  Objective: Vital Signs: Blood pressure (!) 146/61, pulse 72, temperature 98 F (36.7 C), temperature source Oral, resp. rate 14, height 5' 2.01" (1.575 m), weight 57.8 kg, SpO2 95 %. No results found. No results for input(s): WBC, HGB, HCT, PLT in the last 72 hours.  No results for input(s): NA, K, CL, CO2, GLUCOSE, BUN, CREATININE, CALCIUM in the last 72 hours.   Intake/Output Summary (Last 24 hours) at 02/18/2021 0900 Last data filed at 02/17/2021 2223 Gross per 24 hour  Intake 100 ml  Output --  Net 100 ml      Pressure Injury 02/15/21 Buttocks Left Stage 1 -  Intact skin with non-blanchable redness of a localized area usually over a bony prominence. (Active)  02/15/21 1544  Location: Buttocks  Location Orientation: Left  Staging: Stage 1 -  Intact skin with non-blanchable redness of a localized area usually over a bony prominence.  Wound Description (Comments):   Present on Admission:     Physical Exam: BP (!) 146/61 (BP Location: Left Arm)   Pulse 72   Temp 98 F (36.7 C) (Oral)   Resp 14   Ht 5' 2.01" (1.575 m)   Wt 57.8 kg   SpO2 95%   BMI 23.30 kg/m    General: No acute distress Mood and affect are appropriate Heart: Regular rate and rhythm no rubs murmurs or extra sounds Lungs: Clear to auscultation, breathing unlabored, no rales or wheezes Abdomen: Positive bowel sounds, soft nontender to palpation, nondistended Extremities: No clubbing, cyanosis, or edema Skin: No evidence of breakdown, no evidence of rash  Follows simple commands.   Motor: UE 4-/5.  RLE: Hip flexion, knee extension 3+/5, ankle dorsiflexion 4/5 LLE: 3 -/5 proximal distal  Assessment/Plan: 1. Functional deficits which require 3+ hours per day of interdisciplinary therapy in a comprehensive inpatient rehab  setting. Physiatrist is providing close team supervision and 24 hour management of active medical problems listed below. Physiatrist and rehab team continue to assess barriers to discharge/monitor patient progress toward functional and medical goals   Care Tool:  Bathing    Body parts bathed by patient: Face, Right arm, Left arm, Chest, Abdomen   Body parts bathed by helper: Abdomen, Front perineal area, Buttocks, Right upper leg, Left upper leg, Right lower leg, Left lower leg Body parts n/a: Buttocks, Front perineal area   Bathing assist Assist Level: Maximal Assistance - Patient 24 - 49%     Upper Body Dressing/Undressing Upper body dressing   What is the patient wearing?: Pull over shirt    Upper body assist Assist Level: Maximal Assistance - Patient 25 - 49%    Lower Body Dressing/Undressing Lower body dressing      What is the patient wearing?: Pants     Lower body assist Assist for lower body dressing: Total Assistance - Patient < 25%     Toileting Toileting    Toileting assist Assist for toileting: Dependent - Patient 0%     Transfers Chair/bed transfer  Transfers assist     Chair/bed transfer assist level: Maximal Assistance - Patient 25 - 49% Chair/bed transfer assistive device: Armrests, Walker   Locomotion Ambulation   Ambulation assist      Assist level: Moderate Assistance - Patient 50 - 74% Assistive device: Walker-rolling Max distance: 47ft   Walk 10 feet  activity   Assist  Walk 10 feet activity did not occur: Safety/medical concerns        Walk 50 feet activity   Assist Walk 50 feet with 2 turns activity did not occur: Safety/medical concerns         Walk 150 feet activity   Assist Walk 150 feet activity did not occur: Safety/medical concerns         Walk 10 feet on uneven surface  activity   Assist Walk 10 feet on uneven surfaces activity did not occur: Safety/medical concerns          Wheelchair     Assist Is the patient using a wheelchair?: Yes Type of Wheelchair: Manual Wheelchair activity did not occur: Refused         Wheelchair 50 feet with 2 turns activity    Assist            Wheelchair 150 feet activity     Assist  Wheelchair 150 feet activity did not occur: Refused        Medical Problem List and Plan: 1.  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  Conservative care per neurosurgery.  -Continue CIR therapies including PT, OT, and SLP , ELOS 10/12 2.  Antithrombotics: -DVT/anticoagulation:  Mechanical: Sequential compression devices, below knee Bilateral lower extremities             -antiplatelet therapy: Aspirin 81 mg daily resumed 3. Pain Management/Gout flare right foot: Tylenol as needed             -prednisone taper, avoid tight fitting shoes and socks  Improving 4. Mood: Celexa 10 mg daily, Namenda 28 mg nightly, Aricept 10 mg daily             -antipsychotic agents: N/A 5. Neuropsych: This patient is not capable of making decisions on her own behalf. 6. Skin/Wound Care: Routine skin checks             -eucerin cream BLE 7. Fluids/Electrolytes/Nutrition: Routine in and outs 8.  CKD.  Creatinine baseline 1.03-1.39.    At baseline or better BUN improved but poor intake change IVF to nocturnal  BMP Latest Ref Rng & Units 02/14/2021 02/13/2021 02/11/2021  Glucose 70 - 99 mg/dL 123(H) 136(H) 215(H)  BUN 8 - 23 mg/dL 17 28(H) 31(H)  Creatinine 0.44 - 1.00 mg/dL 1.00 1.04(H) 1.15(H)  Sodium 135 - 145 mmol/L 135 136 136  Potassium 3.5 - 5.1 mmol/L 3.6 4.0 3.8  Chloride 98 - 111 mmol/L 102 103 103  CO2 22 - 32 mmol/L 25 23 24   Calcium 8.9 - 10.3 mg/dL 8.8(L) 8.7(L) 9.1    9.  Diabetes mellitus.  Hemoglobin A1c 6.2.  Metformin 500 mg twice daily prior to admission and resume as needed.  CBG (last 3)  No results for input(s): GLUCAP in the last 72 hours.   10.  Klebsiella UTI.  Keflex completed.-  recheck UA neg  11.  Hyperlipidemia.  Hold Zocor 20 mg daily secondary to elevated LFTs 12.  Orthostasis.  Currently on Inderal 20 mg twice daily as well as low-dose Norvasc 5 mg daily initiated 02/08/2021.     Vitals:   02/17/21 1929 02/18/21 0420  BP: (!) 155/56 (!) 146/61  Pulse: 83 72  Resp: 16 14  Temp: 98.4 F (36.9 C) 98 F (36.7 C)  SpO2: 100% 95%   Mildly elevated 9/30 check orthostatic vitals daily  13. Elevated Transaminases.   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   Advanced to D3 thins, continue to advance as tolerated 15. Slow transit constipation  -prn suppository  -senna-s at HS  16.  Hx dementia on dual therapy , Baseline cognition limited higher level (no med management or financial decisions, occ incont, sundowning with mild agitation) LOS: 8 days A FACE TO FACE EVALUATION WAS PERFORMED  Charlett Blake 02/18/2021, 9:00 AM

## 2021-02-18 NOTE — Progress Notes (Signed)
Physical Therapy TBI Note  Patient Details  Name: [name] MRN: [id number] Date of Birth: 27-Jul-1940  Today's Date: 02/18/2021 PT Individual Time: 1300-1415 PT Individual Time Calculation (min): 75 min  and 75 mins Today's Date: 02/18/2021 PT Missed Time: 13 Minutes Missed Time Reason: Patient fatigue  Short Term Goals: Week 2:  PT Short Term Goal 1 (Week 2): Pt will perform supine<>sit with min assist >75% of the time PT Short Term Goal 2 (Week 2): Pt will perform sit<>stands using LRAD with min assist >75% of the time PT Short Term Goal 3 (Week 2): Pt will perform bed<>chair transfers using LRAD with min assist >75% of the time PT Short Term Goal 4 (Week 2): Pt will ambulate at least 56ft using LRAD with min assist of 1 PT Short Term Goal 5 (Week 2): Pt will progress to requiring only mod cuing to initiate and motor plan basic functional transfers  Skilled Therapeutic Interventions/Progress Updates:    Session 1: Patient received sitting up in bed, asleep, dtr at bedside. Patient initially very difficult to wake. Dtr reports that she was not able to wake patient up. RN reports that patient was awake/alert this AM and ate breakfast/took rx without issue. Dtr voiced concerns about progressive decline since "event" in the bathroom last week- medical team has been made aware. PT used Max multimodal cues and wet washcloth to wake patient up. Patient requiring a significant amount of time to respond to cues and required Max hand over hand assist to sit edge of bed. Patient required frequent reorientation and PT had to re-introduce herself multiple times throughout session as patient would doze off and wake up again very disoriented. Patient with pain response to touch to R LE, but did not elaborate further on where it hurt specifically, how much and whether she remembered injuring it or not. Once seated edge of bed, patient required MaxA to come to stand and then sat on the bed again unexpectedly.  TotalA to scoot forward again. MaxA to stand pivot to recliner with Max encouragement from PT and dtr. TotalA to reposition in the chair. Patient fell asleep again immediately in the chair. Chair alarm on, call light within reach, dtr at bedside.   Session 2: Patient received sitting up in recliner, agreeable to PT. She required max reorientation throughout therapy session. She denied pain. After extensive encouragement, patient agreeable to PT assist to wc. Extended time and up to MaxA to complete stand pivot to wc. PT transporting patient in wc to therapy gym for time management and energy conservation. Patient becoming fearful stating she didn't know where she was- PT reorienting patient again. Attempted simple initiation task of handing ball back and forth between patient and therapist- however, patient very externally distracted. PT moved patient to another gym to minimize distractions. There, she was able to complete 1 sit <> stand with MaxA, extended time and Max encouragement. Patient reporting that she felt as though she had gone to the bathroom and requested to go back to her room. ModA x2 stand pivot to bed. ModA + max verbal cues for initiation to complete pericare and clothing management at bed level. Patient found to have not been incontinent of bowel or bladder. PT assessing if patient knew how to use call bell- she did not. PT providing patient with soft-touch call bell. RN present for bladder scan, found to need I/O cath. PT assist with patient positioning and reorientation throughout process. Patient becoming increasingly agitated with this task. At  end of session, patient remaining in bed, bed alarm on, call light within reach, RN at bedside.   Therapy Documentation Precautions:  Precautions Precautions: Fall Precaution Comments: confusion, decreased awareness and decreased initiation Restrictions Weight Bearing Restrictions: No  Agitated Behavior Scale: TBI Observation  Details Observation Environment: patient room Start of observation period - Date: 02/18/21 Start of observation period - Time: 1300 End of observation period - Date: 02/18/21 End of observation period - Time: 1415 Agitated Behavior Scale (DO NOT LEAVE BLANKS) Short attention span, easy distractibility, inability to concentrate: Present to a moderate degree Impulsive, impatient, low tolerance for pain or frustration: Present to a moderate degree Uncooperative, resistant to care, demanding: Present to a slight degree Violent and/or threatening violence toward people or property: Absent Explosive and/or unpredictable anger: Present to a slight degree Rocking, rubbing, moaning, or other self-stimulating behavior: Absent Pulling at tubes, restraints, etc.: Absent Wandering from treatment areas: Absent Restlessness, pacing, excessive movement: Absent Repetitive behaviors, motor, and/or verbal: Absent Rapid, loud, or excessive talking: Absent Sudden changes of mood: Present to a slight degree Easily initiated or excessive crying and/or laughter: Absent Self-abusiveness, physical and/or verbal: Absent Agitated behavior scale total score: 21     Therapy/Group: Individual Therapy  Karoline Caldwell, PT, DPT, CBIS  02/18/2021, 2:20 PM

## 2021-02-18 NOTE — Progress Notes (Signed)
Patient states he rested well  during the night ,cooperative and denies need for additional medication .Continue to monitor and assist, wound vac intact, no drainage in cannister, left stump dressing intact. Continue to maintain medical regime.

## 2021-02-19 NOTE — Progress Notes (Signed)
Physical Therapy Session Note  Patient Details  Name: [name] MRN: [id number] Date of Birth: 24-Apr-1941  Today's Date: 02/19/2021 PT Individual Time: 1008-1108 PT Individual Time Calculation (min): 60 min   Short Term Goals: Week 2:  PT Short Term Goal 1 (Week 2): Pt will perform supine<>sit with min assist >75% of the time PT Short Term Goal 2 (Week 2): Pt will perform sit<>stands using LRAD with min assist >75% of the time PT Short Term Goal 3 (Week 2): Pt will perform bed<>chair transfers using LRAD with min assist >75% of the time PT Short Term Goal 4 (Week 2): Pt will ambulate at least 25ft using LRAD with min assist of 1 PT Short Term Goal 5 (Week 2): Pt will progress to requiring only mod cuing to initiate and motor plan basic functional transfers  Skilled Therapeutic Interventions/Progress Updates:    Pt received supine, asleep in bed but awakens to verbal stimulus and agreeable to therapy session. Pt with more clear and intelligible speech this morning, but pt also appearing disoriented with puzzled/distressed/confused facial expression- therapist reorients her throughout session and pt continues to just reply "I don't know" showing minimal comprehension of this information. Pt noted to be incontinent of bladder.  Supine>sitting R EOB, HOB partially elevated and using bedrail, with cuing for sequencing and CGA for trunk upright. Scoots forward towards EOB with supervision and cuing demonstrating improved effectiveness of this movement. Sitting EOB donned TED hose and shoes total assist.   Sit>stand EOB>RW with min/mod assist for lifting to stand. Gait training ~46ft into bathroom using RW with min assist - pt demos improved gait mechanics with forward advancement of steps as opposed to the continuous marching in place she has been performing throughout this past week as well as faster movements and more upright posture as well as improved motor planning and sequencing of LE stepping  and AD management to turn and sit on BSC over toilet.   Pt's gait, initiation of tasks, and command following is better today, appearing more like it did this past Saturday prior to the event on the toilet.  Once standing in front of toilet, immediately prior to sitting pt starts urinating therefore therapist provided total assist to doff brief and max facilitation to sit on toilet. Further continent of bladder and bowels. Nurse present to change sacral dressing. While sitting on BSC over toilet completed LB hygiene due to being soiled. Standing using RW and grab bar support with min assist - repeated cuing to maintain upright, trunk/hip extended posture as pt tends to crouch down during total assist peri-care and LB clothing management.   Gait training back to recliner as described above. Pt left seated in recliner with BLEs elevated, needs in reach, and seat belt alarm on.   Therapy Documentation Precautions:  Precautions Precautions: Fall Precaution Comments: confusion, decreased awareness and decreased initiation Restrictions Weight Bearing Restrictions: No  Pain:  Continues to have some sensitivity to bilateral lower legs and feet though not as sensitive today.   Therapy/Group: Individual Therapy  Tawana Scale , PT, DPT, NCS, CSRS 02/19/2021, 7:55 AM

## 2021-02-19 NOTE — Progress Notes (Addendum)
[place] PHYSICAL MEDICINE & REHABILITATION PROGRESS NOTE  Subjective/Complaints:  Remains disoriented , hx of dementia with sundowning at home, now with SDH, oriented to self intermittently  No c/os  ROS: limited by cognition   Objective: Vital Signs: Blood pressure (!) 163/85, pulse 62, temperature 98.5 F (36.9 C), resp. rate 17, height 5' 2.01" (1.575 m), weight 57.8 kg, SpO2 93 %. No results found. No results for input(s): WBC, HGB, HCT, PLT in the last 72 hours.  No results for input(s): NA, K, CL, CO2, GLUCOSE, BUN, CREATININE, CALCIUM in the last 72 hours.   Intake/Output Summary (Last 24 hours) at 02/19/2021 1055 Last data filed at 02/18/2021 1903 Gross per 24 hour  Intake 220 ml  Output 575 ml  Net -355 ml      Pressure Injury 02/15/21 Buttocks Left Stage 1 -  Intact skin with non-blanchable redness of a localized area usually over a bony prominence. (Active)  02/15/21 1544  Location: Buttocks  Location Orientation: Left  Staging: Stage 1 -  Intact skin with non-blanchable redness of a localized area usually over a bony prominence.  Wound Description (Comments):   Present on Admission:     Physical Exam: BP (!) 163/85 (BP Location: Left Arm)   Pulse 62   Temp 98.5 F (36.9 C)   Resp 17   Ht 5' 2.01" (1.575 m)   Wt 57.8 kg   SpO2 93%   BMI 23.30 kg/m    General: No acute distress Mood and affect are appropriate Heart: Regular rate and rhythm no rubs murmurs or extra sounds Lungs: Clear to auscultation, breathing unlabored, no rales or wheezes Abdomen: Positive bowel sounds, soft nontender to palpation, nondistended Extremities: No clubbing, cyanosis, or edema Skin: No evidence of breakdown, no evidence of rash Follows simple commands.   Motor: UE 4-/5.  RLE: Hip flexion, knee extension 3+/5, ankle dorsiflexion 4/5 LLE: 3 -/5 proximal distal  Assessment/Plan: 1. Functional deficits which require 3+ hours per day of interdisciplinary therapy in a  comprehensive inpatient rehab setting. Physiatrist is providing close team supervision and 24 hour management of active medical problems listed below. Physiatrist and rehab team continue to assess barriers to discharge/monitor patient progress toward functional and medical goals   Care Tool:  Bathing    Body parts bathed by patient: Face, Right arm, Left arm, Chest, Abdomen   Body parts bathed by helper: Abdomen, Front perineal area, Buttocks, Right upper leg, Left upper leg, Right lower leg, Left lower leg Body parts n/a: Buttocks, Front perineal area   Bathing assist Assist Level: Maximal Assistance - Patient 24 - 49%     Upper Body Dressing/Undressing Upper body dressing   What is the patient wearing?: Pull over shirt    Upper body assist Assist Level: Maximal Assistance - Patient 25 - 49%    Lower Body Dressing/Undressing Lower body dressing      What is the patient wearing?: Pants, Incontinence brief     Lower body assist Assist for lower body dressing: Total Assistance - Patient < 25%     Toileting Toileting    Toileting assist Assist for toileting: Dependent - Patient 0%     Transfers Chair/bed transfer  Transfers assist     Chair/bed transfer assist level: Maximal Assistance - Patient 25 - 49% Chair/bed transfer assistive device: Armrests, Walker   Locomotion Ambulation   Ambulation assist      Assist level: Moderate Assistance - Patient 50 - 74% Assistive device: Walker-rolling Max distance: 48ft  Walk 10 feet activity   Assist  Walk 10 feet activity did not occur: Safety/medical concerns        Walk 50 feet activity   Assist Walk 50 feet with 2 turns activity did not occur: Safety/medical concerns         Walk 150 feet activity   Assist Walk 150 feet activity did not occur: Safety/medical concerns         Walk 10 feet on uneven surface  activity   Assist Walk 10 feet on uneven surfaces activity did not occur:  Safety/medical concerns         Wheelchair     Assist Is the patient using a wheelchair?: Yes Type of Wheelchair: Manual Wheelchair activity did not occur: Refused         Wheelchair 50 feet with 2 turns activity    Assist            Wheelchair 150 feet activity     Assist  Wheelchair 150 feet activity did not occur: Refused        Medical Problem List and Plan: 1.  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  Conservative care per neurosurgery.  -Continue CIR therapies including PT, OT, and SLP , ELOS 10/12 2.  Antithrombotics: -DVT/anticoagulation:  Mechanical: Sequential compression devices, below knee Bilateral lower extremities             -antiplatelet therapy: Aspirin 81 mg daily resumed 3. Pain Management/Gout flare right foot: Tylenol as needed             -prednisone taper, avoid tight fitting shoes and socks  Improving 4. Mood: Celexa 10 mg daily, Namenda 28 mg nightly, Aricept 10 mg daily             -antipsychotic agents: N/A 5. Neuropsych: This patient is not capable of making decisions on her own behalf. 6. Skin/Wound Care: Routine skin checks             -eucerin cream BLE 7. Fluids/Electrolytes/Nutrition: Routine in and outs 8.  CKD.  Creatinine baseline 1.03-1.39.    At baseline or better BUN improved but poor intake change IVF to nocturnal  BMP Latest Ref Rng & Units 02/14/2021 02/13/2021 02/11/2021  Glucose 70 - 99 mg/dL 123(H) 136(H) 215(H)  BUN 8 - 23 mg/dL 17 28(H) 31(H)  Creatinine 0.44 - 1.00 mg/dL 1.00 1.04(H) 1.15(H)  Sodium 135 - 145 mmol/L 135 136 136  Potassium 3.5 - 5.1 mmol/L 3.6 4.0 3.8  Chloride 98 - 111 mmol/L 102 103 103  CO2 22 - 32 mmol/L 25 23 24   Calcium 8.9 - 10.3 mg/dL 8.8(L) 8.7(L) 9.1    9.  Diabetes mellitus.  Hemoglobin A1c 6.2.  Metformin 500 mg twice daily prior to admission and resume as needed.  CBG (last 3)  No results for input(s): GLUCAP in the last 72 hours.   10.   Klebsiella UTI.  Keflex completed.- recheck UA neg  11.  Hyperlipidemia.  Hold Zocor 20 mg daily secondary to elevated LFTs 12.  Orthostasis.  Currently on Inderal 20 mg twice daily as well as low-dose Norvasc 5 mg daily initiated 02/08/2021.     Vitals:   02/18/21 1950 02/19/21 0515  BP: (!) 155/59 (!) 163/85  Pulse: 84 62  Resp: 16 17  Temp: 98.9 F (37.2 C) 98.5 F (36.9 C)  SpO2: 97% 93%   Moderately elevated 9/30 and 10/1 check orthostatic today , if no drop will increase  amlodipine to 10mg   13. Elevated Transaminases.   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   Advanced to D3 thins, continue to advance as tolerated 15. Slow transit constipation  -prn suppository  -senna-s at HS  16.  Hx dementia on dual therapy , Baseline cognition limited higher level (no med management or financial decisions, occ incont, sundowning with mild agitation) LOS: 9 days A FACE TO FACE EVALUATION WAS PERFORMED  Charlett Blake 02/19/2021, 10:55 AM

## 2021-02-20 NOTE — Progress Notes (Signed)
Occupational Therapy TBI Note  Patient Details  Name: [name] MRN: [id number] Date of Birth: [birth year]  Today's Date: 02/20/2021 OT Individual Time: 1660-6301 OT Individual Time Calculation (min): 54 min    Short Term Goals: Week 2:  OT Short Term Goal 1 (Week 2): Pt will maintain sustained attention for greater than 5 mins during selfcare tasks with no more than mod instructional cueing. OT Short Term Goal 2 (Week 2): Pt will complete at least 2 grooming tasks in sitting with no more than min assist. OT Short Term Goal 3 (Week 2): Pt complete toilet transfer with mod assist consistently using the RW and 3:1. OT Short Term Goal 4 (Week 2): Pt will complete UB dressing with min assist for donning pullover shirt.  Skilled Therapeutic Interventions/Progress Updates:    Treatment session with focus on memory and attention during table top task.  Pt received semi-reclined in recliner fidgeting with blankets (repetitive folding and unfolding).  Pt agreeable to therapy session in room.  Therapist presented bead stringing activity for attention and memory.  Therapist started with 4 color sequence with therapist threading initial 4 beads and writing color sequence on paper.  Pt able to identify aloud the colors but then requiring max cues to initiate and attend to task to locate correct colored bead.  Therapist downgraded task to 2 colors, still requiring max cues. Therapist transitioned to matching cards by field of 2 with pt still requiring max multimodal cues for problem solving matching.  Pt then picking up cards and placing them to side of table signaling finished the task.  Therapist obtained October calendar for room and engaged in orientation question with pt requiring mod cues to utilize orientation reminders in room.  Even with written reminders on wall, pt still disoriented to time, place, and situation. Pt remained upright in recliner with all fidget blanket on table, safety belt alarm on,  and all needs in reach.  Therapy Documentation Precautions:  Precautions Precautions: Fall Precaution Comments: confusion, decreased awareness and decreased initiation Restrictions Weight Bearing Restrictions: No General:   Vital Signs: Therapy Vitals Temp: (!) 97.4 F (36.3 C) Temp Source: Oral Pulse Rate: 71 Resp: 16 BP: 124/77 Patient Position (if appropriate): Sitting Oxygen Therapy SpO2: 100 % O2 Device: Room Air Pain:  Pt with no c/o pain Agitated Behavior Scale: TBI Observation Details Observation Environment: pt's room Start of observation period - Date: 02/20/21 Start of observation period - Time: 1400 End of observation period - Date: 02/20/21 End of observation period - Time: 1455 Agitated Behavior Scale (DO NOT LEAVE BLANKS) Short attention span, easy distractibility, inability to concentrate: Present to a moderate degree Impulsive, impatient, low tolerance for pain or frustration: Present to a slight degree Uncooperative, resistant to care, demanding: Present to a slight degree Violent and/or threatening violence toward people or property: Absent Explosive and/or unpredictable anger: Absent Rocking, rubbing, moaning, or other self-stimulating behavior: Absent Pulling at tubes, restraints, etc.: Absent Wandering from treatment areas: Absent Restlessness, pacing, excessive movement: Absent Repetitive behaviors, motor, and/or verbal: Present to a moderate degree Rapid, loud, or excessive talking: Absent Sudden changes of mood: Absent Easily initiated or excessive crying and/or laughter: Absent Self-abusiveness, physical and/or verbal: Absent Agitated behavior scale total score: 20   Therapy/Group: Individual Therapy  Simonne Come 02/20/2021, 3:09 PM

## 2021-02-21 LAB — CBC WITH DIFFERENTIAL/PLATELET
Abs Immature Granulocytes: 0.05 10*3/uL (ref 0.00–0.07)
Basophils Absolute: 0.1 10*3/uL (ref 0.0–0.1)
Basophils Relative: 1 %
Eosinophils Absolute: 0.4 10*3/uL (ref 0.0–0.5)
Eosinophils Relative: 3 %
HCT: 33.4 % — ABNORMAL LOW (ref 36.0–46.0)
Hemoglobin: 10.6 g/dL — ABNORMAL LOW (ref 12.0–15.0)
Immature Granulocytes: 1 %
Lymphocytes Relative: 18 %
Lymphs Abs: 1.9 10*3/uL (ref 0.7–4.0)
MCH: 27.8 pg (ref 26.0–34.0)
MCHC: 31.7 g/dL (ref 30.0–36.0)
MCV: 87.7 fL (ref 80.0–100.0)
Monocytes Absolute: 0.6 10*3/uL (ref 0.1–1.0)
Monocytes Relative: 6 %
Neutro Abs: 7.3 10*3/uL (ref 1.7–7.7)
Neutrophils Relative %: 71 %
Platelets: 635 10*3/uL — ABNORMAL HIGH (ref 150–400)
RBC: 3.81 MIL/uL — ABNORMAL LOW (ref 3.87–5.11)
RDW: 14.9 % (ref 11.5–15.5)
WBC: 10.3 10*3/uL (ref 4.0–10.5)
nRBC: 0 % (ref 0.0–0.2)

## 2021-02-21 LAB — BASIC METABOLIC PANEL WITH GFR
Anion gap: 9 (ref 5–15)
BUN: 25 mg/dL — ABNORMAL HIGH (ref 8–23)
CO2: 24 mmol/L (ref 22–32)
Calcium: 8.9 mg/dL (ref 8.9–10.3)
Chloride: 105 mmol/L (ref 98–111)
Creatinine, Ser: 1.21 mg/dL — ABNORMAL HIGH (ref 0.44–1.00)
GFR, Estimated: 45 mL/min — ABNORMAL LOW
Glucose, Bld: 171 mg/dL — ABNORMAL HIGH (ref 70–99)
Potassium: 3.7 mmol/L (ref 3.5–5.1)
Sodium: 138 mmol/L (ref 135–145)

## 2021-02-21 NOTE — Progress Notes (Signed)
Physical Therapy TBI Note  Patient Details  Name: [name] MRN: [id number] Date of Birth: 24-Feb-1941  Today's Date: 02/21/2021 PT Individual Time: 0904-0930 PT Individual Time Calculation (min): 26 min   Short Term Goals: Week 2:  PT Short Term Goal 1 (Week 2): Pt will perform supine<>sit with min assist >75% of the time PT Short Term Goal 2 (Week 2): Pt will perform sit<>stands using LRAD with min assist >75% of the time PT Short Term Goal 3 (Week 2): Pt will perform bed<>chair transfers using LRAD with min assist >75% of the time PT Short Term Goal 4 (Week 2): Pt will ambulate at least 64ft using LRAD with min assist of 1 PT Short Term Goal 5 (Week 2): Pt will progress to requiring only mod cuing to initiate and motor plan basic functional transfers  Skilled Therapeutic Interventions/Progress Updates:     Patient in bed asleep upon PT arrival. Patient easily aroused to verbal and tactile stimulation and agreeable to PT session. Patient denied pain during session.  Therapeutic Activity: Bed Mobility: Patient performed supine to long sitting with supervision with use of bed rails, then required CGA-min A to bring her legs off the bed and scoot EOB with increased time due to deficits in motor planning and initiation. Provided short simple command to "sit-up" followed by sequential cuing with facilitation when motor planning became more challenging. Transfers: Patient performed sit to/from stand x2 with min A and increased time for initiation. Provided multimodal cues for initiation and forward weight shift with B HHA on therapist's forearms.  Gait Training:  Patient ambulated 12 feet and >50 feet using B HHA and +2 w/c follow due to decreased activity tolerance with min A. Ambulated with decreased gait speed, decreased step length and height, near shuffling gait pattern, increased B hip and knee flexion in stance, forward trunk lean, and downward head gaze. Provided verbal cues for erect  posture with visual target, and increased step height for safety. Provided facilitation for forward propulsion to increased gait speed and step length.   Patient in w/c with her daughter in the room at end of session with breaks locked, seat belt alarm set, and all needs within reach.   Therapy Documentation Precautions:  Precautions Precautions: Fall Precaution Comments: confusion, decreased awareness and decreased initiation Restrictions Weight Bearing Restrictions: No Agitated Behavior Scale: TBI Observation Details Observation Environment: patient's room Start of observation period - Date: 02/21/21 Start of observation period - Time: 0904 End of observation period - Date: 02/21/21 End of observation period - Time: 0930 Agitated Behavior Scale (DO NOT LEAVE BLANKS) Short attention span, easy distractibility, inability to concentrate: Present to a moderate degree Impulsive, impatient, low tolerance for pain or frustration: Absent Uncooperative, resistant to care, demanding: Absent Violent and/or threatening violence toward people or property: Absent Explosive and/or unpredictable anger: Absent Rocking, rubbing, moaning, or other self-stimulating behavior: Absent Pulling at tubes, restraints, etc.: Absent Wandering from treatment areas: Absent Restlessness, pacing, excessive movement: Absent Repetitive behaviors, motor, and/or verbal: Present to a slight degree Rapid, loud, or excessive talking: Absent Sudden changes of mood: Absent Easily initiated or excessive crying and/or laughter: Absent Self-abusiveness, physical and/or verbal: Absent Agitated behavior scale total score: 17     Therapy/Group: Individual Therapy  Graylon Amory L Endora Teresi PT, DPT  02/21/2021, 10:10 AM

## 2021-02-21 NOTE — Progress Notes (Signed)
Physical Therapy TBI Note  Patient Details  Name: [name] MRN: [id number] Date of Birth: 1941/04/01  Today's Date: 02/21/2021 PT Individual Time: 9030-0923 PT Individual Time Calculation (min): 30 min   Short Term Goals: Week 2:  PT Short Term Goal 1 (Week 2): Pt will perform supine<>sit with min assist >75% of the time PT Short Term Goal 2 (Week 2): Pt will perform sit<>stands using LRAD with min assist >75% of the time PT Short Term Goal 3 (Week 2): Pt will perform bed<>chair transfers using LRAD with min assist >75% of the time PT Short Term Goal 4 (Week 2): Pt will ambulate at least 49ft using LRAD with min assist of 1 PT Short Term Goal 5 (Week 2): Pt will progress to requiring only mod cuing to initiate and motor plan basic functional transfers  Skilled Therapeutic Interventions/Progress Updates:    Pt received seated in recliner in room with sister in law visiting, agreeable to PT session. No complaints of pain. Pt declines offer to toilet this session. With encouragement pt able to stand to RW with min A. Ambulation 2 x 30 ft with RW and CGA to min A for balance. Pt exhibits shuffling gait pattern, unable to follow cues to increase step length or LE clearance during gait. Pt left seated in recliner in room with needs in reach, quick release belt and chair alarm in place, family present.  Therapy Documentation Precautions:  Precautions Precautions: Fall Precaution Comments: confusion, decreased awareness and decreased initiation Restrictions Weight Bearing Restrictions: No  Agitated Behavior Scale: TBI Observation Details Observation Environment: patient's room Start of observation period - Date: 02/21/21 Start of observation period - Time: 1500 End of observation period - Date: 02/21/21 End of observation period - Time: 1530 Agitated Behavior Scale (DO NOT LEAVE BLANKS) Short attention span, easy distractibility, inability to concentrate: Present to a moderate  degree Impulsive, impatient, low tolerance for pain or frustration: Present to a slight degree Uncooperative, resistant to care, demanding: Present to a slight degree Violent and/or threatening violence toward people or property: Absent Explosive and/or unpredictable anger: Absent Rocking, rubbing, moaning, or other self-stimulating behavior: Present to a slight degree Pulling at tubes, restraints, etc.: Absent Wandering from treatment areas: Absent Restlessness, pacing, excessive movement: Present to a slight degree Repetitive behaviors, motor, and/or verbal: Present to a moderate degree Rapid, loud, or excessive talking: Absent Sudden changes of mood: Present to a slight degree Easily initiated or excessive crying and/or laughter: Absent Self-abusiveness, physical and/or verbal: Absent Agitated behavior scale total score: 23      Therapy/Group: Individual Therapy   Excell Seltzer, PT, DPT, CSRS  02/21/2021, 5:35 PM

## 2021-02-21 NOTE — Progress Notes (Addendum)
Physical Therapy TBI Note  Patient Details  Name: [name] MRN: [id number] Date of Birth: 08-08-1940  Today's Date: 02/21/2021 PT Individual Time: 1000-1057 PT Individual Time Calculation (min): 57 min   Short Term Goals: Week 2:  PT Short Term Goal 1 (Week 2): Pt will perform supine<>sit with min assist >75% of the time PT Short Term Goal 2 (Week 2): Pt will perform sit<>stands using LRAD with min assist >75% of the time PT Short Term Goal 3 (Week 2): Pt will perform bed<>chair transfers using LRAD with min assist >75% of the time PT Short Term Goal 4 (Week 2): Pt will ambulate at least 45ft using LRAD with min assist of 1 PT Short Term Goal 5 (Week 2): Pt will progress to requiring only mod cuing to initiate and motor plan basic functional transfers  Skilled Therapeutic Interventions/Progress Updates:    Patient received sitting up in wc, agreeable to PT. Dtr at bedside. When asked if patient had pain she responded "I don't feel anything." Patient appears much more awake and alert today, but remains minimally oriented. Patient able to don clean shirt with MinA + max verbal cues and extended time. Noted poor orientation and motor planning. Patient completing sit <> stand with RW and ModA to don pants. PT transporting patient in wc to therapy gym for time management and energy conservation. Patient ambulating 4x68ft with RW + MinA. Very short, shuffling steps with forward flexed posture and excessive flexion in B knees, but no buckling noted. Patient very easily distracted and required Mod cues to redirect to task. Patient attempting to complete standing dynamic balance task with peg board puzzle, however, patient began falling asleep while standing. Patient returning to room in wc, dtr still at bedside. When patient returned to room she became very hyperverbal and noted to have verbal perseverations and eye [id number]. Patient fluctuating between laughing and borderline crying. Vitals assessed:  111/60 (74) 59BPM. RN aware of patients presentation. Remaining up in wc with seatbelt alarm on, call light within reach, dtr at bedside.   Therapy Documentation Precautions:  Precautions Precautions: Fall Precaution Comments: confusion, decreased awareness and decreased initiation Restrictions Weight Bearing Restrictions: No  Agitated Behavior Scale: TBI Observation Details Observation Environment: patients room Start of observation period - Date: 02/21/21 Start of observation period - Time: 1000 End of observation period - Date: 02/21/21 End of observation period - Time: 1100 Agitated Behavior Scale (DO NOT LEAVE BLANKS) Short attention span, easy distractibility, inability to concentrate: Present to a moderate degree Impulsive, impatient, low tolerance for pain or frustration: Present to a slight degree Uncooperative, resistant to care, demanding: Present to a slight degree Violent and/or threatening violence toward people or property: Absent Explosive and/or unpredictable anger: Absent Rocking, rubbing, moaning, or other self-stimulating behavior: Present to a slight degree Pulling at tubes, restraints, etc.: Absent Wandering from treatment areas: Absent Restlessness, pacing, excessive movement: Absent Repetitive behaviors, motor, and/or verbal: Present to a moderate degree Rapid, loud, or excessive talking: Absent Sudden changes of mood: Present to a moderate degree Easily initiated or excessive crying and/or laughter: Present to a moderate degree Self-abusiveness, physical and/or verbal: Absent Agitated behavior scale total score: 25      Therapy/Group: Individual Therapy  Karoline Caldwell, PT, DPT, CBIS  02/21/2021, 11:03 AM

## 2021-02-21 NOTE — Progress Notes (Signed)
[facility] PHYSICAL MEDICINE & REHABILITATION PROGRESS NOTE  Subjective/Complaints: Mrs. Welle has no complaints this morning Her daughter said therapy went really well today. Her daughter notes that she declined after vasovagal episode in restroom last week. Reviewed CT Head with her.   ROS: limited by cognition   Objective: Vital Signs: Blood pressure 138/62, pulse 70, temperature 98.3 F (36.8 C), resp. rate 16, height 5' 2.01" (1.575 m), weight 57.8 kg, SpO2 95 %. No results found. Recent Labs    02/21/21 1049  WBC 10.3  HGB 10.6*  HCT 33.4*  PLT 635*   Recent Labs    02/21/21 1049  NA 138  K 3.7  CL 105  CO2 24  GLUCOSE 171*  BUN 25*  CREATININE 1.21*  CALCIUM 8.9    Intake/Output Summary (Last 24 hours) at 02/21/2021 1209 Last data filed at 02/20/2021 1839 Gross per 24 hour  Intake 340 ml  Output --  Net 340 ml     Pressure Injury 02/15/21 Buttocks Left Stage 1 -  Intact skin with non-blanchable redness of a localized area usually over a bony prominence. (Active)  02/15/21 1544  Location: Buttocks  Location Orientation: Left  Staging: Stage 1 -  Intact skin with non-blanchable redness of a localized area usually over a bony prominence.  Wound Description (Comments):   Present on Admission:     Physical Exam: BP 138/62 (BP Location: Left Arm)   Pulse 70   Temp 98.3 F (36.8 C)   Resp 16   Ht 5' 2.01" (1.575 m)   Wt 57.8 kg   SpO2 95%   BMI 23.30 kg/m  Gen: no distress, normal appearing HEENT: oral mucosa pink and moist, NCAT Cardio: Reg rate Chest: normal effort, normal rate of breathing Abd: soft, non-distended Ext: no edema Psych: pleasant, normal affect Skin: intact Neuro:  Follows simple commands.   Motor: UE 4-/5.  RLE: Hip flexion, knee extension 3+/5, ankle dorsiflexion 4/5 LLE: 3 -/5 proximal distal  Assessment/Plan: 1. Functional deficits which require 3+ hours per day of interdisciplinary therapy in a comprehensive inpatient  rehab setting. Physiatrist is providing close team supervision and 24 hour management of active medical problems listed below. Physiatrist and rehab team continue to assess barriers to discharge/monitor patient progress toward functional and medical goals   Care Tool:  Bathing    Body parts bathed by patient: Face, Right arm, Left arm, Chest, Abdomen   Body parts bathed by helper: Abdomen, Front perineal area, Buttocks, Right upper leg, Left upper leg, Right lower leg, Left lower leg Body parts n/a: Buttocks, Front perineal area   Bathing assist Assist Level: Maximal Assistance - Patient 24 - 49%     Upper Body Dressing/Undressing Upper body dressing   What is the patient wearing?: Pull over shirt    Upper body assist Assist Level: Maximal Assistance - Patient 25 - 49%    Lower Body Dressing/Undressing Lower body dressing      What is the patient wearing?: Pants, Incontinence brief     Lower body assist Assist for lower body dressing: Total Assistance - Patient < 25%     Toileting Toileting    Toileting assist Assist for toileting: Dependent - Patient 0%     Transfers Chair/bed transfer  Transfers assist     Chair/bed transfer assist level: Moderate Assistance - Patient 50 - 74% Chair/bed transfer assistive device: Programmer, multimedia   Ambulation assist      Assist level: Minimal Assistance -  Patient > 75% Assistive device: Walker-rolling Max distance: 38ft   Walk 10 feet activity   Assist  Walk 10 feet activity did not occur: Safety/medical concerns  Assist level: Minimal Assistance - Patient > 75% Assistive device: Walker-rolling   Walk 50 feet activity   Assist Walk 50 feet with 2 turns activity did not occur: Safety/medical concerns         Walk 150 feet activity   Assist Walk 150 feet activity did not occur: Safety/medical concerns         Walk 10 feet on uneven surface  activity   Assist Walk 10 feet on  uneven surfaces activity did not occur: Safety/medical concerns         Wheelchair     Assist Is the patient using a wheelchair?: Yes Type of Wheelchair: Manual Wheelchair activity did not occur: Refused         Wheelchair 50 feet with 2 turns activity    Assist            Wheelchair 150 feet activity     Assist  Wheelchair 150 feet activity did not occur: Refused        Medical Problem List and Plan: 1.  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  Conservative care per neurosurgery.  -Continue CIR therapies including PT, OT, and SLP , ELOS 10/12 2.  Antithrombotics: -DVT/anticoagulation:  Mechanical: Sequential compression devices, below knee Bilateral lower extremities             -antiplatelet therapy: Aspirin 81 mg daily resumed 3. Pain Management/Gout flare right foot: Tylenol as needed             -prednisone taper, avoid tight fitting shoes and socks  Improving 4. Dementia: Celexa 10 mg daily, Namenda 28 mg nightly, Continue Aricept 10 mg daily             -antipsychotic agents: N/A 5. Neuropsych: This patient is not capable of making decisions on her own behalf. 6. Skin/Wound Care: Routine skin checks             -eucerin cream BLE 7. Fluids/Electrolytes/Nutrition: Routine in and outs 8.  CKD.  Creatinine baseline 1.03-1.39.    At baseline or better  Cr 1.21 on 10/3: continue fluids.  BUN improved but poor intake change IVF to nocturnal  BMP Latest Ref Rng & Units 02/21/2021 02/14/2021 02/13/2021  Glucose 70 - 99 mg/dL 171(H) 123(H) 136(H)  BUN 8 - 23 mg/dL 25(H) 17 28(H)  Creatinine 0.44 - 1.00 mg/dL 1.21(H) 1.00 1.04(H)  Sodium 135 - 145 mmol/L 138 135 136  Potassium 3.5 - 5.1 mmol/L 3.7 3.6 4.0  Chloride 98 - 111 mmol/L 105 102 103  CO2 22 - 32 mmol/L 24 25 23   Calcium 8.9 - 10.3 mg/dL 8.9 8.8(L) 8.7(L)    9.  Diabetes mellitus.  Hemoglobin A1c 6.2.  Metformin 500 mg twice daily prior to admission and resume as  needed.  CBG (last 3)  No results for input(s): GLUCAP in the last 72 hours.   10.  Klebsiella UTI.  Keflex completed.- recheck UA neg  11.  Hyperlipidemia.  Hold Zocor 20 mg daily secondary to elevated LFTs 12.  Orthostasis.  Continue Inderal 20 mg twice daily as well as low-dose Norvasc 5 mg daily initiated 02/08/2021.     Vitals:   02/20/21 2103 02/21/21 0328  BP: (!) 151/61 138/62  Pulse: 75 70  Resp: 16 16  Temp: 98.2 F (36.8  C) 98.3 F (36.8 C)  SpO2: 95% 95%   BP well controlled 10/3: continue current regimen 13. Elevated Transaminases.   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   Advanced to D3 thins, continue to advance as tolerated 15. Slow transit constipation  -prn suppository  -senna-s at HS  16.  Hx dementia on dual therapy , Baseline cognition limited higher level (no med management or financial decisions, occ incont, sundowning with mild agitation) 17. Physical and cognitive decline post-vasovagal episode last week: Head CT reviewed with daughter and patient.  LOS: 11 days A FACE TO FACE EVALUATION WAS PERFORMED  Martha Clan P Carlie Corpus 02/21/2021, 12:09 PM

## 2021-02-21 NOTE — Progress Notes (Signed)
Occupational Therapy Session Note  Patient Details  Name: [name] MRN: [id number] Date of Birth: 1940-12-28  Today's Date: 02/21/2021 OT Individual Time: 1107-1200 OT Individual Time Calculation (min): 53 min    Short Term Goals: Week 2:  OT Short Term Goal 1 (Week 2): Pt will maintain sustained attention for greater than 5 mins during selfcare tasks with no more than mod instructional cueing. OT Short Term Goal 2 (Week 2): Pt will complete at least 2 grooming tasks in sitting with no more than min assist. OT Short Term Goal 3 (Week 2): Pt complete toilet transfer with mod assist consistently using the RW and 3:1. OT Short Term Goal 4 (Week 2): Pt will complete UB dressing with min assist for donning pullover shirt.  Skilled Therapeutic Interventions/Progress Updates:    Pt working on transfer to the bathroom for toileting when therapist came in.  The Charlaine Dalton was utilized with pt completing sit to stand at [facility] assist level.  Pt with noted humming sound and prolonged expressions [facility]!) when talking.  She needed max assist for completion of clothing management but was then able to sit and have a successful BM as well as urinate.  Max assist with max demonstrational cueing for initiation of peri cleaning as well as for pulling pants over hips.  Therapist had to place washcloth in pt's left hand and then help guide her to complete hygiene in both front and back.  Max assist to pull up new brief with pt pulling up pants a mod assist. She then transferred with the RW to the wheelchair and out to the sink.  Therapist assisted pt with washing hair for next part of session with pt's daughter assisting as well.  Max assist to complete secondary to attention level.  She was presented with a washcloth and instructed to dry out her ears secondary to trying to use her finger initially.  She was able to complete with mod instructional cueing.  Finished session with mod assist transfer stand pivot with  the RW to the recliner with min assist to scoot back.  Pt still with garbled exaggerated speech throughout session, not oriented to place, time, or situation.  She was unable to state her birthday either.  She was lft in the chair with alarm in place working on eating her lunch.  She did initiate eating with the spoon using the LUE when presented with her tray.    Therapy Documentation Precautions:  Precautions Precautions: Fall Precaution Comments: confusion, decreased awareness and decreased initiation Restrictions Weight Bearing Restrictions: No   Pain: Pain Assessment Pain Scale: Faces Pain Score: 0-No pain ADL: See Care Tool Section for some details of mobility and selfcare   Therapy/Group: Individual Therapy  Siria Calandro OTR/L 02/21/2021, 12:32 PM

## 2021-02-21 NOTE — Progress Notes (Signed)
Speech Language Pathology Daily Session Note  Patient Details  Name: [name] MRN: [id number] Date of Birth: 01-Dec-1940  Today's Date: 02/21/2021 SLP Individual Time: 3016-0109 SLP Individual Time Calculation (min): 45 min  Short Term Goals: Week 2: SLP Short Term Goal 1 (Week 2): Patient will follow simple one step-directions during 25% of occasions utilizing compensatory strategies with max A verbal/visual cues SLP Short Term Goal 2 (Week 2): Patient will attend to basic and/or famliar tasks for 3 minute intervals with max A verbal cues for redirection SLP Short Term Goal 3 (Week 2): Patient will locate appropriate information on external aids to improve orientation to place/situation with max A multimodal cues SLP Short Term Goal 4 (Week 2): Pt will consume PO trials of regular textures with effective mastication and oral clearance and without overt s/sx of aspiration prior to consideration of diet advancement.  Skilled Therapeutic Interventions: Pt received in recliner chair and agreeable to skilled ST intervention with focus on cognitive goals. Pt was being fed pudding by her family member on arrival. Pt facilitated self feeding with water cup with intermittent physical support to reposition straw to mouth. Pt exhibited no overt s/sx of aspiration with both pureed consistencies and thin liquid via straw. SLP facilitated session by providing max multimodal cues for counting 1-10, followed by organizing cards in sequential order from 0-5. Pt required max A multimodal cues following 1-step directions and direct modeling from clinician. SLP also facilitated basic verbal reasoning task by identifying 2 items within a category in field of 3 (e.g., find the animals). Pt completed task with mod A verbal and visual cues and eventually required max A verbal cues for sustained attention for ~3 minute duration. Pt began to close her eyes at end of session and difficulty staying aroused. Patient was left  in chair with alarm activated and immediate needs within reach at end of session. Continue per current plan of care.      Pain Pain Assessment Pain Scale: 0-10 Pain Score: 0-No pain  Therapy/Group: Individual Therapy  Patty Sermons 02/21/2021, 2:43 PM

## 2021-02-22 NOTE — Progress Notes (Signed)
Occupational Therapy Session Note  Patient Details  Name: [name] MRN: [id number] Date of Birth: 21-Apr-1941  Today's Date: 02/22/2021 OT Individual Time: 2694-8546 OT Individual Time Calculation (min): 54 min    Short Term Goals: Week 2:  OT Short Term Goal 1 (Week 2): Pt will maintain sustained attention for greater than 5 mins during selfcare tasks with no more than mod instructional cueing. OT Short Term Goal 2 (Week 2): Pt will complete at least 2 grooming tasks in sitting with no more than min assist. OT Short Term Goal 3 (Week 2): Pt complete toilet transfer with mod assist consistently using the RW and 3:1. OT Short Term Goal 4 (Week 2): Pt will complete UB dressing with min assist for donning pullover shirt.  Skilled Therapeutic Interventions/Progress Updates:    Pt in recliner to start session, alert and able to state place, "[facility]ac[facility] Pontes" but unable to state why she is here of the month.  She was able to complete stand pivot transfer to the wheelchair with mod assist using the RW for support and therapist assisting with guiding the walker as well.  She was taken down to the therapy gym where she completed stand pivot transfer to the therapy mat at min assist level.  Max demonstrational cueing for hand placement as she continues to only pull up on the RW when attempting to stand.  Had her focus on card matching game with transitions to standing to place cards on the mirror.  She needed mod questioning cueing to look at the number on the card given to her and for matching it to the one on the mirror.  Min assist for sit to stand without use of UEs as she would try to reach out to the mirror which was positioned slightly out of her reach.  She needed moderate time to complete 4-5 of these with max encouragement secondary to internal distraction.  She would perseverate on going home today and that she needed to stop "playing games"  because "they will be waiting on her".  Therapist continued  to try to re-direct her but this became worse, so had her work on functional mobility with the RW back towards her room.  She was able to ambulate from the therapy gym to the nurses station with min assist, demonstrating short step length.  Finished with transport back to the room via wheelchair and transfer to the recliner at min assist.  Upon sitting, pt closed her eyes and drifted off to sleep.  Soft touch call button and phone in reach with safety belt in place.    Therapy Documentation Precautions:  Precautions Precautions: Fall Precaution Comments: confusion, decreased awareness and decreased initiation Restrictions Weight Bearing Restrictions: No  Pain: Pain Assessment Pain Scale: Faces Pain Score: 0-No pain ADL: See Care Tool Section for some details of mobility and selfcare  Therapy/Group: Individual Therapy  Rhemi Balbach OTR/L 02/22/2021, 4:16 PM

## 2021-02-22 NOTE — Progress Notes (Signed)
Physical Therapy Session Note  Patient Details  Name: [name] MRN: [id number] Date of Birth: Oct 23, 1940  Today's Date: 02/22/2021 PT Individual Time: 1025-1122 PT Individual Time Calculation (min): 57 min   Short Term Goals: Week 2:  PT Short Term Goal 1 (Week 2): Pt will perform supine<>sit with min assist >75% of the time PT Short Term Goal 2 (Week 2): Pt will perform sit<>stands using LRAD with min assist >75% of the time PT Short Term Goal 3 (Week 2): Pt will perform bed<>chair transfers using LRAD with min assist >75% of the time PT Short Term Goal 4 (Week 2): Pt will ambulate at least 8ft using LRAD with min assist of 1 PT Short Term Goal 5 (Week 2): Pt will progress to requiring only mod cuing to initiate and motor plan basic functional transfers  Skilled Therapeutic Interventions/Progress Updates:    Patient received sitting up in recliner, agreeable to PT. Dtr at bedside. Patient did not appear to be in pain- when asked if she had any, she replied "I don't know." Patient transferring to wc via stand pivot with MinA and RW. She demonstrated improved initiation with this task. PT transporting patient in wc to therapy gym for time management and energy conservation. Patient ambulated 101ft with RW and MinA + wc follow. Shuffling gait and increased B knee flexion + forward flexed posture noted. Patient minimally receptive to verbal cues to improve gait mechanics. Ambulated additional 69ft with 1.5# ankle weights to B LE for improved proprioceptive input. Patient then ambulated additional 60ft with RW + MinA + wc follow with 1.5# ankle weights. Noted to have significantly improved gait mechanics with improved foot clearance and step length. She did continue to require intermittent cues, but was much more responsive to them. Patient completing dynamic standing balance and endurance task- standing with CGA/MinA threading shoe lace through holes on fidget blanket. Last week, patient was unable  to complete this task. This session, patient required Min verbal cues to attend to task to complete it. Patient transferring back to recliner via stand pivot with RW and MinA. Discussed with dtr patients ability to complete automatic tasks vs novel tasks and discussed current gait pattern. Dtr reports that patient was shuffling a lot at home and believes this contributed to her fall(s). Patient remaining up in wc, seatbelt alarm on, call light within reach, dtr at bedside.   Therapy Documentation Precautions:  Precautions Precautions: Fall Precaution Comments: confusion, decreased awareness and decreased initiation Restrictions Weight Bearing Restrictions: No     Therapy/Group: Individual Therapy  Karoline Caldwell, PT, DPT, CBIS  02/22/2021, 7:55 AM

## 2021-02-22 NOTE — Progress Notes (Signed)
[facility] PHYSICAL MEDICINE & REHABILITATION PROGRESS NOTE  Subjective/Complaints:  Appears brighter   ROS: limited by cognition   Objective: Vital Signs: Blood pressure (!) 119/55, pulse 68, temperature 97.8 F (36.6 C), resp. rate 14, height 5' 2.01" (1.575 m), weight 57.8 kg, SpO2 100 %. No results found. Recent Labs    02/21/21 1049  WBC 10.3  HGB 10.6*  HCT 33.4*  PLT 635*    Recent Labs    02/21/21 1049  NA 138  K 3.7  CL 105  CO2 24  GLUCOSE 171*  BUN 25*  CREATININE 1.21*  CALCIUM 8.9     Intake/Output Summary (Last 24 hours) at 02/22/2021 0759 Last data filed at 02/21/2021 2036 Gross per 24 hour  Intake 390 ml  Output --  Net 390 ml      Pressure Injury 02/15/21 Buttocks Left Stage 1 -  Intact skin with non-blanchable redness of a localized area usually over a bony prominence. (Active)  02/15/21 1544  Location: Buttocks  Location Orientation: Left  Staging: Stage 1 -  Intact skin with non-blanchable redness of a localized area usually over a bony prominence.  Wound Description (Comments):   Present on Admission:     Physical Exam: BP (!) 119/55 (BP Location: Left Arm)   Pulse 68   Temp 97.8 F (36.6 C)   Resp 14   Ht 5' 2.01" (1.575 m)   Wt 57.8 kg   SpO2 100%   BMI 23.30 kg/m    General: No acute distress Mood and affect are appropriate Heart: Regular rate and rhythm no rubs murmurs or extra sounds Lungs: Clear to auscultation, breathing unlabored, no rales or wheezes Abdomen: Positive bowel sounds, soft nontender to palpation, nondistended Extremities: No clubbing, cyanosis, or edema Skin: No evidence of breakdown, no evidence of rash  Follows simple commands.   Motor: UE 4-/5.  RLE: Hip flexion, knee extension 4-/5, ankle dorsiflexion 4/5 LLE: 4 -/5 proximal distal  Assessment/Plan: 1. Functional deficits which require 3+ hours per day of interdisciplinary therapy in a comprehensive inpatient rehab setting. Physiatrist is  providing close team supervision and 24 hour management of active medical problems listed below. Physiatrist and rehab team continue to assess barriers to discharge/monitor patient progress toward functional and medical goals   Care Tool:  Bathing    Body parts bathed by patient: Face, Right arm, Left arm, Chest, Abdomen   Body parts bathed by helper: Abdomen, Front perineal area, Buttocks, Right upper leg, Left upper leg, Right lower leg, Left lower leg Body parts n/a: Buttocks, Front perineal area   Bathing assist Assist Level: Maximal Assistance - Patient 24 - 49%     Upper Body Dressing/Undressing Upper body dressing   What is the patient wearing?: Pull over shirt    Upper body assist Assist Level: Maximal Assistance - Patient 25 - 49%    Lower Body Dressing/Undressing Lower body dressing      What is the patient wearing?: Pants, Incontinence brief     Lower body assist Assist for lower body dressing: Total Assistance - Patient < 25%     Toileting Toileting    Toileting assist Assist for toileting: Maximal Assistance - Patient 25 - 49%     Transfers Chair/bed transfer  Transfers assist     Chair/bed transfer assist level: Moderate Assistance - Patient 50 - 74% Chair/bed transfer assistive device: Programmer, multimedia   Ambulation assist      Assist level: Minimal Assistance - Patient >  75% Assistive device: Walker-rolling Max distance: 43ft   Walk 10 feet activity   Assist  Walk 10 feet activity did not occur: Safety/medical concerns  Assist level: Minimal Assistance - Patient > 75% Assistive device: Walker-rolling   Walk 50 feet activity   Assist Walk 50 feet with 2 turns activity did not occur: Safety/medical concerns         Walk 150 feet activity   Assist Walk 150 feet activity did not occur: Safety/medical concerns         Walk 10 feet on uneven surface  activity   Assist Walk 10 feet on uneven surfaces  activity did not occur: Safety/medical concerns         Wheelchair     Assist Is the patient using a wheelchair?: Yes Type of Wheelchair: Manual Wheelchair activity did not occur: Refused         Wheelchair 50 feet with 2 turns activity    Assist            Wheelchair 150 feet activity     Assist  Wheelchair 150 feet activity did not occur: Refused        Medical Problem List and Plan: 1.  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  Conservative care per neurosurgery.  -Continue CIR therapies including PT, OT, and SLP , ELOS 10/12, team conf in am  2.  Antithrombotics: -DVT/anticoagulation:  Mechanical: Sequential compression devices, below knee Bilateral lower extremities             -antiplatelet therapy: Aspirin 81 mg daily resumed 3. Pain Management/Gout flare right foot: Tylenol as needed             -prednisone taper, avoid tight fitting shoes and socks  Improving 4. Dementia: Celexa 10 mg daily, Namenda 28 mg nightly, Continue Aricept 10 mg daily             -antipsychotic agents: N/A 5. Neuropsych: This patient is not capable of making decisions on her own behalf. 6. Skin/Wound Care: Routine skin checks             -eucerin cream BLE 7. Fluids/Electrolytes/Nutrition: Routine in and outs 8.  CKD.  Creatinine baseline 1.03-1.39.    At baseline or better  Cr 1.21 on 10/3: continue fluids.  BUN improved but poor intake change IVF to nocturnal  BMP Latest Ref Rng & Units 02/21/2021 02/14/2021 02/13/2021  Glucose 70 - 99 mg/dL 171(H) 123(H) 136(H)  BUN 8 - 23 mg/dL 25(H) 17 28(H)  Creatinine 0.44 - 1.00 mg/dL 1.21(H) 1.00 1.04(H)  Sodium 135 - 145 mmol/L 138 135 136  Potassium 3.5 - 5.1 mmol/L 3.7 3.6 4.0  Chloride 98 - 111 mmol/L 105 102 103  CO2 22 - 32 mmol/L 24 25 23   Calcium 8.9 - 10.3 mg/dL 8.9 8.8(L) 8.7(L)    9.  Diabetes mellitus.  Hemoglobin A1c 6.2.  Metformin 500 mg twice daily prior to admission and resume as  needed.  CBG (last 3)  No results for input(s): GLUCAP in the last 72 hours.   10.  Klebsiella UTI.  Keflex completed.- recheck UA neg  11.  Hyperlipidemia.  Hold Zocor 20 mg daily secondary to elevated LFTs 12.  Orthostasis.  Continue Inderal 20 mg twice daily as well as low-dose Norvasc 5 mg daily initiated 02/08/2021.     Vitals:   02/21/21 2018 02/22/21 0307  BP: (!) 157/72 (!) 119/55  Pulse: 83 68  Resp: 16 14  Temp: 98.2 F (36.8 C) 97.8 F (36.6 C)  SpO2: 100% 100%   BP well controlled 10/4: continue current regimen 13. Elevated Transaminases.   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   Advanced to D3 thins, continue to advance as tolerated 15. Slow transit constipation  -prn suppository  -senna-s at HS  16.  Hx dementia on dual therapy , Baseline cognition limited higher level (no med management or financial decisions, occ incont, sundowning with mild agitation)  LOS: 12 days A FACE TO FACE EVALUATION WAS PERFORMED  Charlett Blake 02/22/2021, 7:59 AM

## 2021-02-22 NOTE — Progress Notes (Signed)
Physical Therapy TBI Note  Patient Details  Name: [name] MRN: [id number] Date of Birth: 11/20/1940  Today's Date: 02/22/2021 PT Individual Time: 3500-9381 PT Individual Time Calculation (min): 45 min   Short Term Goals: Week 2:  PT Short Term Goal 1 (Week 2): Pt will perform supine<>sit with min assist >75% of the time PT Short Term Goal 2 (Week 2): Pt will perform sit<>stands using LRAD with min assist >75% of the time PT Short Term Goal 3 (Week 2): Pt will perform bed<>chair transfers using LRAD with min assist >75% of the time PT Short Term Goal 4 (Week 2): Pt will ambulate at least 10ft using LRAD with min assist of 1 PT Short Term Goal 5 (Week 2): Pt will progress to requiring only mod cuing to initiate and motor plan basic functional transfers  Skilled Therapeutic Interventions/Progress Updates:     Pt supine in bed to start session - awake and agreeable to PT tx. Reports no pain. Supine<>sit completed with supervision with HOB elevated ~40deg and use of bed rail. Requires minA for initial sitting EOB due to R lean but progresses to SBA with time to acclimate to upright. Donned disposable pants with maxA for threading and assisting in pulling over hips in standing. Donned shoes with totalA for time. Sit<>stand to RW with modA for powering to rise - reports she does not need to use bathroom but smelled urine from bladder incontinence. Ambulated short distances in her room with minA and RW, assist needed for RW management and cues needed for lengthening step length and keeping body within walker frame - able to correct initially but unable to maintain. Pt required assist for lower body dressing for toileting and she was continent of bladder once sitting on toilet. Changed brief due to prior incontinence. Ambulated back to her w/c with minA and RW in similar manner as above. Transported to day room rehab gym for time in her w/c. Completed gait training with obstacle course, ~43ft of weaving  in/out of cones. She required minA and RW and max cues for steering RW b/w obstacles - without facilitation she would avoid attempting to go b/w cones. She also continued to require max cues for keeping body close to the walker frame and elongating her steps length as her steps are very short and shuffled. During 2nd effort, allowed her to problem solve the obstacle course but she would simply avoid it and walk past the cones, even when reminded of instructions. She required modA for facilitation and max cues for turning (180deg) to a sitting surface such as her w/c. Returned to her room and assisted to recliner to elevated LE's, modA stand<>pivot transfer. Safety belt alarm on and all call bells within reach at conclusion of session.   Therapy Documentation Precautions:  Precautions Precautions: Fall Precaution Comments: confusion, decreased awareness and decreased initiation Restrictions Weight Bearing Restrictions: No General:    Agitated Behavior Scale: TBI Observation Details Observation Environment: patients room Start of observation period - Date: 02/22/21 Start of observation period - Time: 1030 End of observation period - Date: 02/22/21 End of observation period - Time: 1130 Agitated Behavior Scale (DO NOT LEAVE BLANKS) Short attention span, easy distractibility, inability to concentrate: Present to a moderate degree Impulsive, impatient, low tolerance for pain or frustration: Present to a slight degree Uncooperative, resistant to care, demanding: Absent Violent and/or threatening violence toward people or property: Absent Explosive and/or unpredictable anger: Absent Rocking, rubbing, moaning, or other self-stimulating behavior: Present to a  slight degree Pulling at tubes, restraints, etc.: Absent Wandering from treatment areas: Absent Restlessness, pacing, excessive movement: Absent Repetitive behaviors, motor, and/or verbal: Present to a slight degree Rapid, loud, or  excessive talking: Present to a slight degree Sudden changes of mood: Absent Easily initiated or excessive crying and/or laughter: Absent Self-abusiveness, physical and/or verbal: Absent Agitated behavior scale total score: 20  Therapy/Group: Individual Therapy  Alger Simons 02/22/2021, 12:44 PM

## 2021-02-22 NOTE — Progress Notes (Signed)
Speech Language Pathology Daily Session Note  Patient Details  Name: [name] MRN: [id number] Date of Birth: 11-26-1940  Today's Date: 02/22/2021 SLP Individual Time: 7121-9758 SLP Individual Time Calculation (min): 59 min  Short Term Goals: Week 2: SLP Short Term Goal 1 (Week 2): Patient will follow simple one step-directions during 25% of occasions utilizing compensatory strategies with max A verbal/visual cues SLP Short Term Goal 2 (Week 2): Patient will attend to basic and/or famliar tasks for 3 minute intervals with max A verbal cues for redirection SLP Short Term Goal 3 (Week 2): Patient will locate appropriate information on external aids to improve orientation to place/situation with max A multimodal cues SLP Short Term Goal 4 (Week 2): Pt will consume PO trials of regular textures with effective mastication and oral clearance and without overt s/sx of aspiration prior to consideration of diet advancement.  Skilled Therapeutic Interventions:Skilled ST services focused on swallow and cognitive skills. SLP facilitated sustained attention, recall and basic problem solving skills in simple card tasks. Pt demonstrated sustained attention in 1 minute interval flipping cards over and naming highest number among two cards with supervision A verbal cues for problem solving/accuracy. Pt was able to demonstrate sustained attention in 5 minute intervals with max A fading to mod A verbal cues when sorting cards by two colors, however demonstrated poor problem solving/error awareness skills requiring total A to sort accurately. SLP also facilitated orientation to place/situation, pt was able to locate x1 out 13 opportunities signs in room mod I and required max A verbal and visual cues to locate signs in the remainder of opportunities with 70% accuracy. Pt consumed regular textures trial snack and thin liquid via straw, with deficits in attention to bolus resulting in extended mastication and oral  holding, however with extra time overall swallow function appeared [facility]. SLP labeled soft touch bell with the sign "HELP", pt was able to return demonstration locating call bell x1 out 3 opportunities. Pt was left in room with call bell within reach and chair alarm set. SLP recommends to continue skilled services.     Pain Pain Assessment Pain Score: 0-No pain  Therapy/Group: Individual Therapy  [name]  [facility] 02/22/2021, 3:18 PM

## 2021-02-23 NOTE — Progress Notes (Signed)
Physical Therapy TBI Note  Patient Details  Name: [name] MRN: [id number] Date of Birth: Jun 19, 1940  Today's Date: 02/23/2021 PT Individual Time: 0800-0855 PT Individual Time Calculation (min): 55 min   Short Term Goals: Week 2:  PT Short Term Goal 1 (Week 2): Pt will perform supine<>sit with min assist >75% of the time PT Short Term Goal 2 (Week 2): Pt will perform sit<>stands using LRAD with min assist >75% of the time PT Short Term Goal 3 (Week 2): Pt will perform bed<>chair transfers using LRAD with min assist >75% of the time PT Short Term Goal 4 (Week 2): Pt will ambulate at least 20ft using LRAD with min assist of 1 PT Short Term Goal 5 (Week 2): Pt will progress to requiring only mod cuing to initiate and motor plan basic functional transfers  Skilled Therapeutic Interventions/Progress Updates:    Patient received sitting up in recliner, agreeable to PT. She denies pain. Patient able to state that she is at [place] by using context clues in room, but unable to state why she's there. Patient found to have been incontinent of bladder. She was able to ambulate to bathroom with MinA and Min verbal cues demonstrating improved gait speed and initiation. Maintaining shuffling gait pattern. Patient with continent of urine in toilet. MaxA for clothing management/perihygiene. Patient ambulating to wc with MinA and RW. She did require max cues to initiate hand washing. PT transporting patient in wc to therapy gym for time management and energy conservation. Patient ambulating 84ft x4 with seated rest break between. She was more receptive to cues this session and demonstrated very minimal carryover between bouts. Patient also able to complete novel task of stepping to target to initiate larger steps started seated to minimize degrees of freedom progressing to standing. Patient also with minimal, but evident, carryover to gait task. Patient returning to room in wc, transferring to recliner via  stand pivot with MinA/ModA without AD. Seatbelt alarm on, call light within reach.     Therapy Documentation Precautions:  Precautions Precautions: Fall Precaution Comments: confusion, decreased awareness and decreased initiation Restrictions Weight Bearing Restrictions: No  Agitated Behavior Scale: TBI Observation Details Observation Environment: patients room Start of observation period - Date: 02/23/21 Start of observation period - Time: 0800 End of observation period - Date: 02/23/21 End of observation period - Time: 0900 Agitated Behavior Scale (DO NOT LEAVE BLANKS) Short attention span, easy distractibility, inability to concentrate: Present to a moderate degree Impulsive, impatient, low tolerance for pain or frustration: Present to a slight degree Uncooperative, resistant to care, demanding: Absent Violent and/or threatening violence toward people or property: Absent Explosive and/or unpredictable anger: Absent Rocking, rubbing, moaning, or other self-stimulating behavior: Absent Pulling at tubes, restraints, etc.: Absent Wandering from treatment areas: Absent Restlessness, pacing, excessive movement: Absent Repetitive behaviors, motor, and/or verbal: Present to a slight degree Rapid, loud, or excessive talking: Absent Sudden changes of mood: Absent Easily initiated or excessive crying and/or laughter: Absent Self-abusiveness, physical and/or verbal: Absent Agitated behavior scale total score: 18     Therapy/Group: Individual Therapy  Karoline Caldwell, PT, DPT, CBIS  02/23/2021, 10:11 AM

## 2021-02-23 NOTE — Progress Notes (Signed)
Speech Language Pathology Daily Session Note  Patient Details  Name: [name] MRN: [id number] Date of Birth: Nov 04, 1940  Today's Date: 02/23/2021 SLP Individual Time: 27-1530 SLP Individual Time Calculation (min): 43 min  Short Term Goals: Week 2: SLP Short Term Goal 1 (Week 2): Patient will follow simple one step-directions during 25% of occasions utilizing compensatory strategies with max A verbal/visual cues SLP Short Term Goal 2 (Week 2): Patient will attend to basic and/or famliar tasks for 3 minute intervals with max A verbal cues for redirection SLP Short Term Goal 3 (Week 2): Patient will locate appropriate information on external aids to improve orientation to place/situation with max A multimodal cues SLP Short Term Goal 4 (Week 2): Pt will consume PO trials of regular textures with effective mastication and oral clearance and without overt s/sx of aspiration prior to consideration of diet advancement.  Skilled Therapeutic Interventions:Skilled ST services focused on swallow and cognitive skills. SLP facilitated PO intake trials of regular textures snacks and thin liquids via straw. Pt demonstrated appropriate self-feeding with set up and intermittent cues to continue PO intake. Pt demonstrated increase bolus attention and mastication time compared to yesterday's session with no overt s/s aspiration. SLP upgraded diet to regular textures with continued full supervision to monitor diet upgrade to provide cues for cognition, however pt can likely be reduced to set up assist and intermittent supervision over the next session or two. Pt was only orientated to self, required max A multimodal cues to utilize signs in room to orientate to place/situation. Pt demonstrated no recall of cell bell function from yesterday's session but was able to return demonstration with immediate recall only in 60% of opportunities. Pt demonstrated sustained attention up to 2 minute intervals in card sorting  task by 2 then 3 colors, however after this time attention, error awareness and problem solving quickly faded. Pt was left in room with call bell within reach and chair alarm set. SLP recommends to continue skilled services.     Pain Pain Assessment Pain Score: 0-No pain  Therapy/Group: Individual Therapy  Kimberlye Dilger  [facility] 02/23/2021, 3:36 PM

## 2021-02-23 NOTE — Progress Notes (Signed)
Measured area to left buttocks, area is blanchable and measures 1.2 x 4cm in width as is indicative of a shear. Placing under skin. Removing pressure wound and incision wound as it is no longer visible.

## 2021-02-23 NOTE — Progress Notes (Signed)
Patient ID: [name], female   DOB: 16-Sep-1940, 80 y.o.   MRN: [id number] Team Conference Report to Patient/Family  Team Conference discussion was reviewed with the patient and caregiver, including goals, any changes in plan of care and target discharge date.  Patient and caregiver express understanding and are in agreement.  The patient has a target discharge date of 03/01/21.  SW met with pt daughter in room, provided team conference updates. Family education scheduled with pt daughter on Friday 10/7 8-12 and with pt son on 10/8 10-12 PM. Sw will continue to follow up    Dyanne Iha 02/23/2021, 2:26 PM

## 2021-02-23 NOTE — Progress Notes (Signed)
Occupational Therapy TBI Note  Patient Details  Name: [name] MRN: [id number] Date of Birth: 1940/10/07  Today's Date: 02/23/2021 OT Individual Time: 9758-8325 OT Individual Time Calculation (min): 57 min    Short Term Goals: Week 2:  OT Short Term Goal 1 (Week 2): Pt will maintain sustained attention for greater than 5 mins during selfcare tasks with no more than mod instructional cueing. OT Short Term Goal 2 (Week 2): Pt will complete at least 2 grooming tasks in sitting with no more than min assist. OT Short Term Goal 3 (Week 2): Pt complete toilet transfer with mod assist consistently using the RW and 3:1. OT Short Term Goal 4 (Week 2): Pt will complete UB dressing with min assist for donning pullover shirt.  Skilled Therapeutic Interventions/Progress Updates:    Pt completed bathing and dressing at the sink during session.  Pt's daughter present for session as well as her son.  He stepped out when working on selfcare tasks at the sink with therapist encouraging daughter to stay to see how pt would do.  She was able to complete stand pivot transfer over to the wheelchair with min assist.  She was able to remove her shirt with min assist and complete UB bathing at this level with mod instructional cueing for thoroughness.  She donned a pullover shirt with min assist, increased time, and mod instructional cueing.  [name] needed max assist for removal of dirty LB clothing as well as brief.  Mod assist for washing her legs and peri area with mod instructional cueing needed again for thoroughness.  Next, she needed total assist for donning new brief, pants, socks, and shoes.  Finished session with transfer back to the recliner with min assist using the RW for support.  Call button and phone in reach with safety alarm belt in place.     Therapy Documentation Precautions:  Precautions Precautions: Fall Precaution Comments: confusion, decreased awareness and decreased  initiation Restrictions Weight Bearing Restrictions: No  Pain: Pain Assessment Pain Scale: Faces Pain Score: 0-No pain Agitated Behavior Scale: TBI Observation Details Observation Environment: pt's room Start of observation period - Date: 02/23/21 Start of observation period - Time: 1306 End of observation period - Date: 02/23/21 End of observation period - Time: 1403 Agitated Behavior Scale (DO NOT LEAVE BLANKS) Short attention span, easy distractibility, inability to concentrate: Present to a slight degree Impulsive, impatient, low tolerance for pain or frustration: Present to a slight degree Uncooperative, resistant to care, demanding: Absent Violent and/or threatening violence toward people or property: Absent Explosive and/or unpredictable anger: Absent Rocking, rubbing, moaning, or other self-stimulating behavior: Present to a slight degree Pulling at tubes, restraints, etc.: Absent Wandering from treatment areas: Absent Restlessness, pacing, excessive movement: Absent Repetitive behaviors, motor, and/or verbal: Present to a slight degree Rapid, loud, or excessive talking: Absent Sudden changes of mood: Absent Easily initiated or excessive crying and/or laughter: Absent Self-abusiveness, physical and/or verbal: Absent Agitated behavior scale total score: 18  ADL: See Care Tool Section for some details of mobility and selfcare   Therapy/Group: Individual Therapy  Janisha Bueso OTR/L 02/23/2021, 4:48 PM

## 2021-02-23 NOTE — Progress Notes (Signed)
Occupational Therapy Session Note  Patient Details  Name: [name] MRN: [id number] Date of Birth: 05/29/1940  Today's Date: 02/23/2021 OT Individual Time: 1101-1130 OT Individual Time Calculation (min): 29 min    Short Term Goals: Week 1:  OT Short Term Goal 1 (Week 1): Pt will be able to don shirt with MIN A OT Short Term Goal 1 - Progress (Week 1): Not met OT Short Term Goal 2 (Week 1): Pt will complete sit > stand with LRAD and MIN A in prep for LB dressing OT Short Term Goal 2 - Progress (Week 1): Not met OT Short Term Goal 3 (Week 1): Pt will complete UB bathing with no more than MIN cuing OT Short Term Goal 3 - Progress (Week 1): Not met OT Short Term Goal 4 (Week 1): Pt will complete transfer to raised toilet with MOD A OT Short Term Goal 4 - Progress (Week 1): Not met  Skilled Therapeutic Interventions/Progress Updates:  Patient met seated in recliner in agreement with OT treatment session. 0/10 pain on [employer] Faces scale. Daughter present at bedside. Patient able to state first name only. Unable to state last name or recall daughters name despite multi-choice cues. Session with focus on command following, attention to task and functional cognition in prep for ADLs. With increased time and max question cues, patient able to identify coins and recall their worth. Patient only able to do so when only one coin is presented secondary to internal/external distractions. Patient then able to read 2 safety cards with max time and cues for continuation. With max question cues, patient able to correctly answer 1 safety card. Session concluded with patient in recliner with call bell within reach, belt alarm activated and all needs met.   Therapy Documentation Precautions:  Precautions Precautions: Fall Precaution Comments: confusion, decreased awareness and decreased initiation Restrictions Weight Bearing Restrictions: No General:    Therapy/Group: Individual Therapy  Tuesday Terlecki  [facility] [place] 02/23/2021, 10:59 AM

## 2021-02-23 NOTE — Progress Notes (Signed)
[facility] PHYSICAL MEDICINE & REHABILITATION PROGRESS NOTE  Subjective/Complaints:  Appears brighter , oriented to self only , no c/os   ROS: limited by cognition   Objective: Vital Signs: Blood pressure 124/70, pulse 60, temperature 98.2 F (36.8 C), temperature source Oral, resp. rate 16, height 5' 2.01" (1.575 m), weight 57.8 kg, SpO2 95 %. No results found. Recent Labs    02/21/21 1049  WBC 10.3  HGB 10.6*  HCT 33.4*  PLT 635*    Recent Labs    02/21/21 1049  NA 138  K 3.7  CL 105  CO2 24  GLUCOSE 171*  BUN 25*  CREATININE 1.21*  CALCIUM 8.9     Intake/Output Summary (Last 24 hours) at 02/23/2021 0849 Last data filed at 02/23/2021 0818 Gross per 24 hour  Intake 360 ml  Output --  Net 360 ml      Pressure Injury 02/15/21 Buttocks Left Stage 1 -  Intact skin with non-blanchable redness of a localized area usually over a bony prominence. (Active)  02/15/21 1544  Location: Buttocks  Location Orientation: Left  Staging: Stage 1 -  Intact skin with non-blanchable redness of a localized area usually over a bony prominence.  Wound Description (Comments):   Present on Admission:     Physical Exam: BP 124/70 (BP Location: Right Arm)   Pulse 60   Temp 98.2 F (36.8 C) (Oral)   Resp 16   Ht 5' 2.01" (1.575 m)   Wt 57.8 kg   SpO2 95%   BMI 23.30 kg/m    General: No acute distress Mood and affect are appropriate Heart: Regular rate and rhythm no rubs murmurs or extra sounds Lungs: Clear to auscultation, breathing unlabored, no rales or wheezes Abdomen: Positive bowel sounds, soft nontender to palpation, nondistended Extremities: No clubbing, cyanosis, or edema Skin: No evidence of breakdown, stasis dermatitis BLE    Follows simple commands.   Motor: UE 4-/5.  RLE: Hip flexion, knee extension 4-/5, ankle dorsiflexion 4/5 LLE: 4 -/5 proximal distal  Assessment/Plan: 1. Functional deficits which require 3+ hours per day of interdisciplinary  therapy in a comprehensive inpatient rehab setting. Physiatrist is providing close team supervision and 24 hour management of active medical problems listed below. Physiatrist and rehab team continue to assess barriers to discharge/monitor patient progress toward functional and medical goals   Care Tool:  Bathing    Body parts bathed by patient: Face, Right arm, Left arm, Chest, Abdomen   Body parts bathed by helper: Abdomen, Front perineal area, Buttocks, Right upper leg, Left upper leg, Right lower leg, Left lower leg Body parts n/a: Buttocks, Front perineal area   Bathing assist Assist Level: Maximal Assistance - Patient 24 - 49%     Upper Body Dressing/Undressing Upper body dressing   What is the patient wearing?: Pull over shirt    Upper body assist Assist Level: Maximal Assistance - Patient 25 - 49%    Lower Body Dressing/Undressing Lower body dressing      What is the patient wearing?: Pants, Incontinence brief     Lower body assist Assist for lower body dressing: Total Assistance - Patient < 25%     Toileting Toileting    Toileting assist Assist for toileting: Maximal Assistance - Patient 25 - 49%     Transfers Chair/bed transfer  Transfers assist     Chair/bed transfer assist level: Moderate Assistance - Patient 50 - 74% Chair/bed transfer assistive device: Programmer, multimedia   Ambulation assist  Assist level: Minimal Assistance - Patient > 75% Assistive device: Walker-rolling Max distance: 7f   Walk 10 feet activity   Assist  Walk 10 feet activity did not occur: Safety/medical concerns  Assist level: Minimal Assistance - Patient > 75% Assistive device: Walker-rolling   Walk 50 feet activity   Assist Walk 50 feet with 2 turns activity did not occur: Safety/medical concerns         Walk 150 feet activity   Assist Walk 150 feet activity did not occur: Safety/medical concerns         Walk 10 feet on uneven  surface  activity   Assist Walk 10 feet on uneven surfaces activity did not occur: Safety/medical concerns         Wheelchair     Assist Is the patient using a wheelchair?: Yes Type of Wheelchair: Manual Wheelchair activity did not occur: Refused         Wheelchair 50 feet with 2 turns activity    Assist            Wheelchair 150 feet activity     Assist  Wheelchair 150 feet activity did not occur: Refused        Medical Problem List and Plan: 1.  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  Conservative care per neurosurgery.  -Continue CIR therapies including PT, OT, and SLP , ELOS 10/12,  Team conference today please see physician documentation under team conference tab, met with team  to discuss problems,progress, and goals. Formulized individual treatment plan based on medical history, underlying problem and comorbidities.  2.  Antithrombotics: -DVT/anticoagulation:  Mechanical: Sequential compression devices, below knee Bilateral lower extremities             -antiplatelet therapy: Aspirin 81 mg daily resumed 3. Pain Management/Gout flare right foot: Tylenol as needed             -prednisone taper, avoid tight fitting shoes and socks  Improving 4. Dementia: Celexa 10 mg daily, Namenda 28 mg nightly, Continue Aricept 10 mg daily             -antipsychotic agents: N/A 5. Neuropsych: This patient is not capable of making decisions on her own behalf. 6. Skin/Wound Care: Routine skin checks             -eucerin cream BLE 7. Fluids/Electrolytes/Nutrition: Routine in and outs 8.  CKD.  Creatinine baseline 1.03-1.39.    At baseline or better  Cr 1.21 on 10/3: continue fluids.  BUN improved but poor intake change IVF to nocturnal  BMP Latest Ref Rng & Units 02/21/2021 02/14/2021 02/13/2021  Glucose 70 - 99 mg/dL 171(H) 123(H) 136(H)  BUN 8 - 23 mg/dL 25(H) 17 28(H)  Creatinine 0.44 - 1.00 mg/dL 1.21(H) 1.00 1.04(H)  Sodium 135 -  145 mmol/L 138 135 136  Potassium 3.5 - 5.1 mmol/L 3.7 3.6 4.0  Chloride 98 - 111 mmol/L 105 102 103  CO2 22 - 32 mmol/L _0 Calcium 8.9 - 10.3 mg/dL 8.9 8.8(L) 8.7(L)    9.  Diabetes mellitus.  Hemoglobin A1c 6.2.  Metformin 500 mg twice daily prior to admission and resume as needed.  CBG (last 3)  No results for input(s): GLUCAP in the last 72 hours.   10.  Klebsiella UTI.  Keflex completed.- recheck UA neg  11.  Hyperlipidemia.  Hold Zocor 20 mg daily secondary to elevated LFTs 12.  Orthostasis.  Continue Inderal 20 mg twice daily  as well as low-dose Norvasc 5 mg daily initiated 02/08/2021.     Vitals:   02/22/21 1948 02/23/21 0432  BP: (!) 142/59 124/70  Pulse: 75 60  Resp: 17 16  Temp: 98.2 F (36.8 C) 98.2 F (36.8 C)  SpO2: 97% 95%   BP well controlled 10/5: continue current regimen 13. Elevated Transaminases.   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   Advanced to D3 thins, continue to advance as tolerated 15. Slow transit constipation  -prn suppository  -senna-s at HS  16.  Hx dementia on dual therapy , Baseline cognition limited higher level (no med management or financial decisions, occ incont, sundowning with mild agitation)  LOS: 13 days A FACE TO FACE EVALUATION WAS PERFORMED  Charlett Blake 02/23/2021, 8:49 AM

## 2021-02-23 NOTE — Patient Care Conference (Signed)
Inpatient RehabilitationTeam Conference and Plan of Care Update Date: 02/23/2021   Time: 10:30 AM    Patient Name: [name]      Medical Record Number: [id number]  Date of Birth: 25-May-1940 Sex: Female         Room/Bed: 4W10C/4W10C-01 Payor Info: Payor: MEDICARE / Plan: MEDICARE PART A AND B / Product Type: *No Product type* /    Admit Date/Time:  02/10/2021  2:51 PM  Primary Diagnosis:  Traumatic subdural hematoma  Hospital Problems: Principal Problem:   Traumatic subdural hematoma Active Problems:   Transaminitis   Drug-induced liver injury    Expected Discharge Date: Expected Discharge Date: 03/01/21  Team Members Present: Physician leading conference: Dr. Alysia Penna Social Worker Present: Erlene Quan, BSW Nurse Present: Dorien Chihuahua, RN PT Present: Estevan Ryder, PT OT Present: Clyda Greener, OT SLP Present: Sherren Kerns, SLP PPS Coordinator present : Gunnar Fusi, SLP     Current Status/Progress Goal Weekly Team Focus  Bowel/Bladder   Pt incontinent of B/B LBM: 02/21/21  Increase continent episodes using timed toileting  assess qshift and PRN   Swallow/Nutrition/ Hydration   Sup for bite size, min-to-mod A for self feeding feeding for cognitive factors  sup A  Regular PO trials   ADL's   max assist for selfcare sit to stand with mod assist for functional transfers with use of the RW for support.  Still with increased confusion and decreased sustained attention to tasks.  currently min assist but will likely need to be downgraded  selfcare retraining, transfer training, therapeutic activities, cognitive retraining,  pt/family education   Mobility   Min/Mod bed mobility, Mod STS/SPT with RW, Min/Mod gait 56ft using RW (significant fluctuations in assist/arousal/participation in therapy)  CGA overall at ambulatory level  orientation, activity tolerance, bed mobility, gait progressions, dynamic balance, family ed, initiation   Communication    Mod-to-max A for comprehension. Able to communicate basic functional needs via yes/no questions, limited responses with open ended questions and fluctuates  mod-to-max  following 1-step commands   Safety/Cognition/ Behavioral Observations  max A  max A  focused attention, basic sorting/organization tasks, generaly awareness to situation   Pain   Patient reports no pain  remain pain free  assess pain qshift and PRN   Skin   Redness on L side bottom due to leaning on that side  promote healing and prevent further skin breakdown  assess skin qshift and PRN     Discharge Planning:  Discharging home with assistance from daughter. Dtr potentially hiring assistance   Team Discussion: No issues, BP controlled, chronic dementia. Patient is more alert but remains disoriented. Incontinent as she cannot communicate needs, initiate calling but can be continent if toileted.   Patient on target to meet rehab goals: Little progress overall. Currently min - mod assist for bed mobility and min assist for stand pivot. Needs min - mod assist for toileting and mod - max assist for hygiene.Needs max verbal cues for attention but able to ambulate up to 65' with min - mod assist. Needs hand over hand cues but can follow one step direction. Supervision for swallowing strategies. Has vocal perseveration and working on vocal intelligibility.  *See Care Plan and progress notes for long and short-term goals.   Revisions to Treatment Plan:  Downgraded goals to min - mod -max level Working on focused attention, sorting tasks, awareness to situation Regular diet trials  Teaching Needs: Safety, cues, medications, transfers, toileting, etc.  Current Barriers to Discharge:  Decreased caregiver support, Home enviroment access/layout, Incontinence, and dementia  Possible Resolutions to Barriers: Family education with spouse and daughter Reviewed benefit of getting back to usual setting; adjusted discharge date      Medical Summary Current Status: hx of mod/sev dementia, now with TBI as well, poor fluid intake,  Barriers to Discharge: Nutrition means  Barriers to Discharge Comments: requiring IVF Possible Resolutions to Barriers/Weekly Focus: Toileting program, improve oral fluid and caloric intake, caregiver training   Continued Need for Acute Rehabilitation Level of Care: The patient requires daily medical management by a physician with specialized training in physical medicine and rehabilitation for the following reasons: Direction of a multidisciplinary physical rehabilitation program to maximize functional independence : Yes Medical management of patient stability for increased activity during participation in an intensive rehabilitation regime.: Yes Analysis of laboratory values and/or radiology reports with any subsequent need for medication adjustment and/or medical intervention. : Yes   I attest that I was present, lead the team conference, and concur with the assessment and plan of the team.   Dorien Chihuahua B 02/23/2021, 11:30 AM

## 2021-02-24 LAB — BASIC METABOLIC PANEL
Anion gap: 9 (ref 5–15)
BUN: 28 mg/dL — ABNORMAL HIGH (ref 8–23)
CO2: 28 mmol/L (ref 22–32)
Calcium: 8.9 mg/dL (ref 8.9–10.3)
Chloride: 101 mmol/L (ref 98–111)
Creatinine, Ser: 1.37 mg/dL — ABNORMAL HIGH (ref 0.44–1.00)
GFR, Estimated: 39 mL/min — ABNORMAL LOW (ref >60)
Glucose, Bld: 140 mg/dL — ABNORMAL HIGH (ref 70–99)
Potassium: 4 mmol/L (ref 3.5–5.1)
Sodium: 138 mmol/L (ref 135–145)

## 2021-02-24 NOTE — Progress Notes (Signed)
Speech Language Pathology Daily Session Note  Patient Details  Name: [name] MRN: [id number] Date of Birth: November 10, 1940  Today's Date: 02/24/2021 SLP Individual Time: 9024-0973 SLP Individual Time Calculation (min): 35 min  Short Term Goals: Week 2: SLP Short Term Goal 1 (Week 2): Patient will follow simple one step-directions during 25% of occasions utilizing compensatory strategies with max A verbal/visual cues SLP Short Term Goal 2 (Week 2): Patient will attend to basic and/or famliar tasks for 3 minute intervals with max A verbal cues for redirection SLP Short Term Goal 3 (Week 2): Patient will locate appropriate information on external aids to improve orientation to place/situation with max A multimodal cues SLP Short Term Goal 4 (Week 2): Pt will consume PO trials of regular textures with effective mastication and oral clearance and without overt s/sx of aspiration prior to consideration of diet advancement.  Skilled Therapeutic Interventions:   Patient seen with daughter present in room for skilled ST session focusing on cognitive function goals. She was sitting up in recliner when SLP arrived and was alert and smiling. Daughter reports that patient seems to perform best in afternoon but in morning she is more confused and lethargic. SLP first presented object photos to patient and she was able to name and answer basic level question about each (object function, etc). She was not able to elaborate on basic responses even with mod-max verbal cues. SLP then introduced color pegs and she was able to point to correct color when named 75% of the time after SLP modeling. Patient then started to close eyes and became less responsive despite SLP and patient's daughter trying to arouse her. SLP ended session early because of fatigue and patient left with daughter present, in recliner with alarm belt in place. Patient continues to benefit from skilled SLP intervention to maximize cognitive and  swallow function prior to discharge.   Pain Pain Assessment Pain Scale: Faces Pain Score: 0-No pain Faces Pain Scale: No hurt  Therapy/Group: Individual Therapy  Sonia Baller, MA, CCC-SLP Speech Therapy

## 2021-02-24 NOTE — Progress Notes (Signed)
Physical Therapy Weekly Progress Note  Patient Details  Name: [name] MRN: [id number] Date of Birth: [birth year]  Beginning of progress report period: February 17, 2021 End of progress report period: February 24, 2021  Today's Date: 02/24/2021 PT Individual Time: 1324-4010; 1110-1140 PT Individual Time Calculation (min): 27 min; 30 mins (missed 15 d/t fatigue)  Patient has met 4 of 5 short term goals.  Patient is making slow/minimal progress toward her goals. She is limited by her premorbid dx of dementia, poor initiation, poor attention to task, decreased motor planning, poor endurance, decreased functional strength. Patient fluctuates in physical assist needs from MinA/CGA to [place]. She consistently needs Max verbal cues to initiate task/maintain attention to task. LTGs downgraded to grossly MinA to reflect progress.   Patient continues to demonstrate the following deficits muscle weakness, decreased cardiorespiratoy endurance, impaired timing and sequencing, unbalanced muscle activation, motor apraxia, decreased coordination, and decreased motor planning, decreased motor planning, decreased initiation, decreased attention, decreased awareness, decreased problem solving, decreased safety awareness, decreased memory, and delayed processing, and decreased sitting balance, decreased standing balance, decreased postural control, and decreased balance strategies and therefore will continue to benefit from skilled PT intervention to increase functional independence with mobility.  Patient not progressing toward long term goals.  See goal revision..  Plan of care revisions: Goals downgraded to MinA grossly due to patient progress.  PT Short Term Goals Week 2:  PT Short Term Goal 1 (Week 2): Pt will perform supine<>sit with min assist >75% of the time PT Short Term Goal 1 - Progress (Week 2): Met PT Short Term Goal 2 (Week 2): Pt will perform sit<>stands using LRAD with min assist >75% of the  time PT Short Term Goal 2 - Progress (Week 2): Met PT Short Term Goal 3 (Week 2): Pt will perform bed<>chair transfers using LRAD with min assist >75% of the time PT Short Term Goal 3 - Progress (Week 2): Met PT Short Term Goal 4 (Week 2): Pt will ambulate at least 75f using LRAD with min assist of 1 PT Short Term Goal 4 - Progress (Week 2): Met PT Short Term Goal 5 (Week 2): Pt will progress to requiring only mod cuing to initiate and motor plan basic functional transfers PT Short Term Goal 5 - Progress (Week 2): Progressing toward goal Week 3:  PT Short Term Goal 1 (Week 3): STG= LTG based on ELOS  Skilled Therapeutic Interventions/Progress Updates:  Session 1: Patient received sitting up in bed, agreeable to PT. She denies pain. Patient moving to sit edge of bed with MinA, HOB elevated and use of bed rails. Demonstrating improved initiation, but remains hypokinetic. Patient required MaxA to don pants and shoes while seated edge of bed. MinA to come to stand. Max cues to pull up pants before starting to walk. Patient perseverative on not receiving breakfast (even though she did and ate close to 100% of it per NT). PT with difficulty redirecting patient. Ambulatory transfer to recliner with RW and MinA +Max verbal cues. Patient declining to use bathroom despite PT encouragement. Seatbelt alarm on, call light within reach, NT aware that patient is requesting more food.   Session 2: Patient received sitting up in recliner, dtr at bedside, appearing fatigued. She denies pain and is agreeable to PT. She required Max cues to initiate ambulatory transfer to wc with RW and MinA. PT transporting patient in wc to therapy gym for time management and energy conservation. Once in the gym, patient closing her eyes  and not opening them despite Max multimodal cuing from PT to engage in any therapeutic activity. Patient opened her eyes again once in the hallway by her room. She was able to ambulate to her room ~76f  with RW + MinA and max verbal cues to continue task. Once seated in recliner, patient closing her eyes again. Dtr reporting concerns about patients vocalizations/phonations. PT has not observed changes in this, but will communicate to team. Patient remaining up in recliner, seatbelt alarm on, call light within reach, dtr at bedside.   Therapy Documentation Precautions:  Precautions Precautions: Fall Precaution Comments: confusion, decreased awareness and decreased initiation Restrictions Weight Bearing Restrictions: No   Therapy/Group: Individual Therapy  JDebbora Dus10/10/2020, 7:52 AM

## 2021-02-24 NOTE — Plan of Care (Signed)
  Problem: RH Tub/Shower Transfers Goal: LTG Patient will perform tub/shower transfers w/assist (OT) Description: LTG: Patient will perform tub/shower transfers with assist, with/without cues using equipment (OT) Outcome: Not Applicable   Problem: RH Tub/Shower Transfers Goal: LTG Patient will perform tub/shower transfers w/assist (OT) Description: LTG: Patient will perform tub/shower transfers with assist, with/without cues using equipment (OT) Outcome: Not Applicable   Problem: RH Tub/Shower Transfers Goal: LTG Patient will perform tub/shower transfers w/assist (OT) Description: LTG: Patient will perform tub/shower transfers with assist, with/without cues using equipment (OT) Outcome: Not Applicable

## 2021-02-24 NOTE — Progress Notes (Signed)
Occupational Therapy Weekly Progress Note  Patient Details  Name: [name] MRN: [id number] Date of Birth: 03-23-1941  Beginning of progress report period: February 19, 2021 End of progress report period: February 24, 2021  Today's Date: 02/24/2021 OT Individual Time: 2952-8413 OT Individual Time Calculation (min): 68 min    Patient has met 3 of 4 short term goals.  [name] is making slow progress with OT at this time.  She was able to complete UB bathing with supervision to min assist and then needs mod to max assist for LB bathing and dressing.  She continues with limited sustained attention to tasks and at times closes her eyes, requiring max instructional cueing to keep them open.  She remains disoriented to place, time, or situation.  Transfers are at a min to mod assist with the RW.  Increased shuffling steps are noted with mod assist at times to guide the RW toward surfaces.  She continues to exhibit self stimulating behaviors moaning of humming out loud as well as echoing and drawing out her words.  Progress has currently been limited secondary to poor initiation and carry through of tasks.  Feel she will not meet current min assist level goals but will instead need min to max, depending on attention level and cognition.  Recommend continued CIR level therapy with family education to be completed tomorrow 02/25/21 and discharge planned on 03/01/21.  Patient continues to demonstrate the following deficits: muscle weakness, unbalanced muscle activation, decreased coordination, and decreased motor planning, decreased initiation, decreased attention, decreased awareness, decreased problem solving, decreased safety awareness, decreased memory, and delayed processing, and decreased standing balance, decreased postural control, and decreased balance strategies and therefore will continue to benefit from skilled OT intervention to enhance overall performance with BADL and Reduce care partner  burden.  Patient not progressing toward long term goals.  See goal revision..  Continue plan of care.  OT Short Term Goals Week 3:  OT Short Term Goal 1 (Week 3): Continue working on estabilished LTGs at min to max assist.  Skilled Therapeutic Interventions/Progress Updates:    Pt in recliner to start session with her friend visiting.  She was not oriented to place, time, or situation when asked.  Had her complete transfer to the wheelchair stand pivot with the RW and overall mod assist for guidance of the walker.  Noted pt with bowel and bladder in continence in her brief, during transfer so, had her transfer to the 3:1 over the toilet as well at the same level.  Short shuffling steps noted with use of the walker with total assist for completion of toilet hygiene and clothing management.  Nursing made aware of dark urine in the brief as well.  After cleaning up and donning new brief and pants at max assist level, she was able to transfer back out to the wheelchair.  Had her complete combing her hair at the sink in sitting with min assist for thoroughness.  She was then taken down to the therapy gym where therapist attempted to engage her in BUE use of the ergonometer.  Max assist was needed secondary to decreased sustained attention to get her to complete 2 intervals of 2 mins.  Therapist would assist her to initiate and then she would complete 1-2 revolutions and then stop.  Transitioned back to the room via wheelchair after attempt of task with transfer to the recliner with mod assist.  She was then able to complete laundry folding of towels when placed  in her lap and instructed to fold.  Pt noted falling asleep during task and needed mod instructional cueing to complete.  Finished session with safety belt in place and with the call button and phone in reach.    Therapy Documentation Precautions:  Precautions Precautions: Fall Precaution Comments: confusion, decreased awareness and decreased  initiation Restrictions Weight Bearing Restrictions: No  Pain: Pain Assessment Pain Scale: Faces Pain Score: 0-No pain ADL: See Care Tool Section for some details of mobility and selfcare  Therapy/Group: Individual Therapy  [name] OTR/L 02/24/2021, 3:40 PM

## 2021-02-24 NOTE — Progress Notes (Signed)
[facility] PHYSICAL MEDICINE & REHABILITATION PROGRESS NOTE  Subjective/Complaints: Daughter feels she is dehydrated and fatigued as a result- repeat creatinine today as was elevated Monday   ROS: Limtied by cognition   Objective: Vital Signs: Blood pressure (!) 138/51, pulse 71, temperature 97.9 F (36.6 C), temperature source Oral, resp. rate 18, height 5' 2.01" (1.575 m), weight 57.8 kg, SpO2 97 %. No results found. No results for input(s): WBC, HGB, HCT, PLT in the last 72 hours. No results for input(s): NA, K, CL, CO2, GLUCOSE, BUN, CREATININE, CALCIUM in the last 72 hours.  Intake/Output Summary (Last 24 hours) at 02/24/2021 1352 Last data filed at 02/24/2021 1200 Gross per 24 hour  Intake 540 ml  Output --  Net 540 ml         Physical Exam: BP (!) 138/51 (BP Location: Right Arm)   Pulse 71   Temp 97.9 F (36.6 C) (Oral)   Resp 18   Ht 5' 2.01" (1.575 m)   Wt 57.8 kg   SpO2 97%   BMI 23.30 kg/m  Gen: no distress, normal appearing HEENT: oral mucosa pink and moist, NCAT Cardio: Reg rate Chest: normal effort, normal rate of breathing Abd: soft, non-distended Ext: no edema Psych: pleasant, normal affect Skin: intact Neuro:   Follows simple commands.   Motor: UE 4-/5.  RLE: Hip flexion, knee extension 4-/5, ankle dorsiflexion 4/5 LLE: 4 -/5 proximal distal  Assessment/Plan: 1. Functional deficits which require 3+ hours per day of interdisciplinary therapy in a comprehensive inpatient rehab setting. Physiatrist is providing close team supervision and 24 hour management of active medical problems listed below. Physiatrist and rehab team continue to assess barriers to discharge/monitor patient progress toward functional and medical goals   Care Tool:  Bathing    Body parts bathed by patient: Right arm, Left arm, Chest, Abdomen, Front perineal area, Right upper leg, Left upper leg, Face   Body parts bathed by helper: Right lower leg, Left lower leg,  Buttocks Body parts n/a: Buttocks, Front perineal area   Bathing assist Assist Level: Minimal Assistance - Patient > 75%     Upper Body Dressing/Undressing Upper body dressing   What is the patient wearing?: Pull over shirt    Upper body assist Assist Level: Minimal Assistance - Patient > 75%    Lower Body Dressing/Undressing Lower body dressing      What is the patient wearing?: Pants, Incontinence brief     Lower body assist Assist for lower body dressing: Maximal Assistance - Patient 25 - 49%     Toileting Toileting    Toileting assist Assist for toileting: Maximal Assistance - Patient 25 - 49%     Transfers Chair/bed transfer  Transfers assist     Chair/bed transfer assist level: Moderate Assistance - Patient 50 - 74% Chair/bed transfer assistive device: Museum/gallery exhibitions officer assist      Assist level: Minimal Assistance - Patient > 75% Assistive device: Walker-rolling Max distance: 102ft   Walk 10 feet activity   Assist  Walk 10 feet activity did not occur: Safety/medical concerns  Assist level: Minimal Assistance - Patient > 75% Assistive device: Walker-rolling   Walk 50 feet activity   Assist Walk 50 feet with 2 turns activity did not occur: Safety/medical concerns         Walk 150 feet activity   Assist Walk 150 feet activity did not occur: Safety/medical concerns         Walk 10  feet on uneven surface  activity   Assist Walk 10 feet on uneven surfaces activity did not occur: Safety/medical concerns         Wheelchair     Assist Is the patient using a wheelchair?: Yes Type of Wheelchair: Manual Wheelchair activity did not occur: Refused         Wheelchair 50 feet with 2 turns activity    Assist            Wheelchair 150 feet activity     Assist  Wheelchair 150 feet activity did not occur: Refused        Medical Problem List and Plan: 1.  Debility with altered mental  status secondary to traumatic SDH d.  Conservative care per neurosurgery.  -Continue CIR therapies including PT, OT, and SLP ELOS 10/12,  2.  Antithrombotics: -DVT/anticoagulation:  Mechanical: Sequential compression devices, below knee Bilateral lower extremities             -antiplatelet therapy: Aspirin 81 mg daily resumed 3. Pain Management/Gout flare right foot: Tylenol as needed             -prednisone taper, avoid tight fitting shoes and socks  Improving 4. Dementia: Celexa 10 mg daily, Namenda 28 mg nightly, Continue Aricept 10 mg daily             -antipsychotic agents: N/A 5. Neuropsych: This patient is not capable of making decisions on her own behalf. 6. Skin/Wound Care: Routine skin checks             -eucerin cream BLE 7. Fluids/Electrolytes/Nutrition: Routine in and outs 8.  CKD.  Creatinine baseline 1.03-1.39.    At baseline or better  Cr 1.21 on 10/3: continue fluids. Check Cr today BUN improved but poor intake change IVF to nocturnal  BMP Latest Ref Rng & Units 02/21/2021 02/14/2021 02/13/2021  Glucose 70 - 99 mg/dL 171(H) 123(H) 136(H)  BUN 8 - 23 mg/dL 25(H) 17 28(H)  Creatinine 0.44 - 1.00 mg/dL 1.21(H) 1.00 1.04(H)  Sodium 135 - 145 mmol/L 138 135 136  Potassium 3.5 - 5.1 mmol/L 3.7 3.6 4.0  Chloride 98 - 111 mmol/L 105 102 103  CO2 22 - 32 mmol/L 24 25 23   Calcium 8.9 - 10.3 mg/dL 8.9 8.8(L) 8.7(L)    9.  Diabetes mellitus.  Hemoglobin A1c 6.2.  Metformin 500 mg twice daily prior to admission and resume as needed.  CBG (last 3)  No results for input(s): GLUCAP in the last 72 hours.   10.  Klebsiella UTI.  Keflex completed.- recheck UA neg  11.  Hyperlipidemia.  Hold Zocor 20 mg daily secondary to elevated LFTs 12.  Orthostasis.  Continue Inderal 20 mg twice daily as well as low-dose Norvasc 5 mg daily initiated 02/08/2021.     Vitals:   02/24/21 0409 02/24/21 1307  BP: (!) 138/58 (!) 138/51  Pulse: 70 71  Resp: 16 18  Temp: 98 F (36.7 C) 97.9 F (36.6  C)  SpO2: 95% 97%   BP well controlled 10/6: continue current regimen 13. Elevated Transaminases.   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   Advanced to D3 thins, continue to advance as tolerated 15. Slow transit constipation  -prn suppository  -senna-s at HS  16.  Hx dementia on dual therapy , Baseline cognition limited higher level (no med management or financial decisions, occ incont, sundowning with mild agitation)  LOS: 14 days A FACE TO FACE EVALUATION WAS  PERFORMED  Izora Ribas 02/24/2021, 1:52 PM

## 2021-02-24 NOTE — Progress Notes (Addendum)
Patient ID: [name], female   DOB: 03-Sep-1940, 80 y.o.   MRN: [id number]  [facility] agency list provided to pt daughter. Daughter spoke with pt spouse and unsure if he is confident of providing 24/7 assistance. Daughter will discuss with pt spouse tonight and make decision of SNF or d/c. Daughter will send SW their facility preference today to initiate SNF referral.  Erlene Quan, [place]

## 2021-02-24 NOTE — [place] FL2 (Addendum)
[place] MEDICAID FL2 LEVEL OF CARE SCREENING TOOL     IDENTIFICATION  Patient Name: [name] Birthdate: 04-18-1941 Sex: female Admission Date (Current Location): 02/10/2021  [facility] and [place] Number:  Herbalist and Address:  The . 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   Pressure injury of skin 02/04/2021   Subdural hematoma 02/03/2021   Subdural hematoma, post-traumatic 02/02/2021   Recurrent falls 02/02/2021   Laceration of occipital region of scalp 02/02/2021   LFTs abnormal 12/23/2020   Deficiency anemia 07/27/2020   Insomnia, 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   Corns and callus 07/27/2020   Chronic renal disease, stage 3, moderately decreased glomerular filtration rate (GFR) between 30-59 mL/min/1.73 square meter ([place]) 09/26/2018   Senile dementia without behavioral disturbance ([place]) 03/21/2017   Atherosclerosis of native artery of both lower extremities with intermittent claudication ([place]) 11/24/2015   Cervical stenosis of spinal canal 12/22/2014   Visit for screening mammogram 09/16/2014   Routine health maintenance 01/03/2011   Peripheral neuropathy ([place]) 08/23/2010   DM (diabetes mellitus) type II controlled, neurological manifestation ([place]) 07/21/2010   Carotid artery stenosis 12/15/2009   DEGENERATIVE [place] DISEASE, LUMBOSACRAL SPINE W/RADICULOPATHY 12/29/2008   Dyslipidemia, goal LDL below 70 02/16/2007   Gout 02/16/2007   Essential  hypertension 02/16/2007    Orientation RESPIRATION BLADDER Height & Weight        Normal Incontinent Weight: 127 lb 6.8 oz (57.8 kg) Height:  5' 2.01" (157.5 cm)  BEHAVIORAL SYMPTOMS/MOOD NEUROLOGICAL BOWEL NUTRITION STATUS      Incontinent Diet  AMBULATORY STATUS COMMUNICATION OF NEEDS Skin   Limited Assist Verbally Other (Comment) (Redness on L side bottom due to leaning on that side)                       Personal Care Assistance Level of Assistance  Bathing, Dressing, Feeding Bathing Assistance: Limited assistance Feeding assistance: Limited assistance Dressing Assistance: Limited assistance     Functional Limitations Info             [place]  PT (By licensed PT), OT (By licensed OT), Speech therapy, Bowel and bladder program     PT Frequency: 5x a week OT Frequency: 5x a week     Speech Therapy Frequency: 5x a week      Contractures      Additional Factors Info                  Current Medications (02/24/2021):  This is the current hospital active medication list Current Facility-Administered Medications  Medication Dose Route Frequency Provider Last Rate Last Admin   0.45 % sodium chloride infusion   Intravenous Continuous Raulkar, Clide Deutscher, MD 75 mL/hr at 02/21/21 1339 Rate Change at 02/21/21 1339   amLODipine (NORVASC) tablet 5 mg  5 mg Oral Daily Cathlyn Parsons, PA-C   5 mg at 02/24/21 0842   ammonium lactate (LAC-HYDRIN) 12 %  lotion   Topical PRN Charlett Blake, MD   Given at 02/18/21 0825   aspirin chewable tablet 81 mg  81 mg Oral Daily Charlett Blake, MD   81 mg at 02/24/21 0841   bisacodyl (DULCOLAX) suppository 10 mg  10 mg Rectal Daily PRN [name], MD   10 mg at 02/12/21 0946   citalopram (CELEXA) tablet 10 mg  10 mg Oral Daily Cathlyn Parsons, PA-C   10 mg at 02/24/21 1779   memantine (NAMENDA XR) 24 hr capsule 28 mg  28 mg Oral QHS Cathlyn Parsons, PA-C   28 mg at 02/23/21 2050   And    donepezil (ARICEPT) tablet 10 mg  10 mg Oral QHS Cathlyn Parsons, PA-C   10 mg at 02/23/21 2049   lidocaine (XYLOCAINE) 5 % ointment   Topical Daily PRN Cathlyn Parsons, PA-C       mupirocin ointment (BACTROBAN) 2 % 1 application  1 application Nasal BID Cathlyn Parsons, PA-C   1 application at 39/03/00 0844   ondansetron (ZOFRAN) tablet 4 mg  4 mg Oral Q6H PRN Cathlyn Parsons, PA-C       Or   ondansetron [facility]) injection 4 mg  4 mg Intravenous Q6H PRN Angiulli, Lavon Paganini, PA-C       propranolol (INDERAL) tablet 20 mg  20 mg Oral BID Cathlyn Parsons, PA-C   20 mg at 02/24/21 9233   senna-docusate (Senokot-S) tablet 2 tablet  2 tablet Oral QHS [name], MD   2 tablet at 02/23/21 2048     Discharge Medications: Please see discharge summary for a list of discharge medications.  Relevant Imaging Results:  Relevant Lab Results:   Additional Information    Dyanne Iha

## 2021-02-24 NOTE — Plan of Care (Signed)
  Problem: RH Wheelchair Mobility Goal: LTG Patient will propel w/c in controlled environment (PT) Description: LTG: Patient will propel wheelchair in controlled environment, # of feet with assist (PT) Outcome: Not Applicable Goal: LTG Patient will propel w/c in home environment (PT) Description: LTG: Patient will propel wheelchair in home environment, # of feet with assistance (PT). Outcome: Not Applicable

## 2021-02-25 MED ORDER — TUBERCULIN PPD 5 UNIT/0.1ML ID SOLN
5.0000 [IU] | Freq: Once | INTRADERMAL | Status: AC
  Administered 2021-02-25: 5 [IU] via INTRADERMAL
  Filled 2021-02-25: qty 0.1

## 2021-02-25 NOTE — Plan of Care (Signed)
  Problem: Consults Goal: RH STROKE PATIENT EDUCATION Description: See Patient Education module for education specifics  Outcome: Progressing   Problem: RH BOWEL ELIMINATION Goal: RH STG MANAGE BOWEL WITH ASSISTANCE Description: STG Manage Bowel with Assistance. Outcome: Progressing Goal: RH STG MANAGE BOWEL W/MEDICATION W/ASSISTANCE Description: STG Manage Bowel with Medication with Assistance. Outcome: Progressing   Problem: RH BLADDER ELIMINATION Goal: RH STG MANAGE BLADDER WITH ASSISTANCE Description: STG Manage Bladder With moD I Assistance Outcome: Progressing   Problem: RH SKIN INTEGRITY Goal: RH STG SKIN FREE OF INFECTION/BREAKDOWN Description: Manage w min assist Outcome: Progressing   Problem: RH SAFETY Goal: RH STG ADHERE TO SAFETY PRECAUTIONS W/ASSISTANCE/DEVICE Description: STG Adhere to Safety Precautions With cues Assistance/Device. Outcome: Progressing   Problem: RH KNOWLEDGE DEFICIT Goal: RH STG INCREASE KNOWLEDGE OF DIABETES Description: Patient's family will be able to manage  DM with medications and dietary modifications using handouts and educational resources independently Outcome: Progressing Goal: RH STG INCREASE KNOWLEDGE OF HYPERTENSION Description: Patient's family will be able to manage  HTN with medications and dietary modifications using handouts and educational resources independently Outcome: Progressing Goal: RH STG INCREASE KNOWLEDGE OF DYSPHAGIA/FLUID INTAKE Description: Patient's family will be able to manage  Dysphagia, medications and dietary modifications using handouts and educational resources independently Outcome: Progressing Goal: RH STG INCREASE KNOWLEGDE OF HYPERLIPIDEMIA Description: Patient's family will be able to manage  HLD with medications and dietary modifications using handouts and educational resources independently Outcome: Progressing Goal: RH STG INCREASE KNOWLEDGE OF STROKE PROPHYLAXIS Description: Patient's family  will be able to manage secondary stroke risk with medications and dietary modifications using handouts and educational resources independently Outcome: Progressing

## 2021-02-25 NOTE — Progress Notes (Signed)
Tuberculin injection administered on Left forearm.

## 2021-02-25 NOTE — Progress Notes (Signed)
[name] PHYSICAL MEDICINE & REHABILITATION PROGRESS NOTE  Subjective/Complaints: Discussed with daughter that creatinine is elevating but not far from baseline. Daughter feels she is dehydrated, discussing increase fluids rate.  Appreciate SW assistance with FL2 form  ROS: Limited by cognition   Objective: Vital Signs: Blood pressure (!) 149/62, pulse 72, temperature 99.5 F (37.5 C), temperature source Oral, resp. rate 16, height 5' 2.01" (1.575 m), weight 57.8 kg, SpO2 97 %. No results found. No results for input(s): WBC, HGB, HCT, PLT in the last 72 hours. Recent Labs    02/24/21 1345  NA 138  K 4.0  CL 101  CO2 28  GLUCOSE 140*  BUN 28*  CREATININE 1.37*  CALCIUM 8.9    Intake/Output Summary (Last 24 hours) at 02/25/2021 1745 Last data filed at 02/25/2021 1231 Gross per 24 hour  Intake 305 ml  Output --  Net 305 ml         Physical Exam: BP (!) 149/62 (BP Location: Right Arm)   Pulse 72   Temp 99.5 F (37.5 C) (Oral)   Resp 16   Ht 5' 2.01" (1.575 m)   Wt 57.8 kg   SpO2 97%   BMI 23.30 kg/m  Gen: no distress, normal appearing HEENT: oral mucosa pink and moist, NCAT Cardio: Reg rate Chest: normal effort, normal rate of breathing Abd: soft, non-distended Ext: no edema Psych: pleasant, normal affect Skin: intact Neuro:  Follows simple commands.   Motor: UE 4-/5.  RLE: Hip flexion, knee extension 4-/5, ankle dorsiflexion 4/5 LLE: 4 -/5 proximal distal  Assessment/Plan: 1. Functional deficits which require 3+ hours per day of interdisciplinary therapy in a comprehensive inpatient rehab setting. Physiatrist is providing close team supervision and 24 hour management of active medical problems listed below. Physiatrist and rehab team continue to assess barriers to discharge/monitor patient progress toward functional and medical goals   Care Tool:  Bathing    Body parts bathed by patient: Right arm, Left arm, Chest, Abdomen, Front perineal area,  Right upper leg, Left upper leg, Face   Body parts bathed by helper: Right lower leg, Left lower leg, Buttocks Body parts n/a: Buttocks, Front perineal area   Bathing assist Assist Level: Minimal Assistance - Patient > 75%     Upper Body Dressing/Undressing Upper body dressing   What is the patient wearing?: Pull over shirt    Upper body assist Assist Level: Minimal Assistance - Patient > 75%    Lower Body Dressing/Undressing Lower body dressing      What is the patient wearing?: Pants, Incontinence brief     Lower body assist Assist for lower body dressing: Maximal Assistance - Patient 25 - 49%     Toileting Toileting    Toileting assist Assist for toileting: Maximal Assistance - Patient 25 - 49%     Transfers Chair/bed transfer  Transfers assist     Chair/bed transfer assist level: Moderate Assistance - Patient 50 - 74% Chair/bed transfer assistive device: Museum/gallery exhibitions officer assist      Assist level: Minimal Assistance - Patient > 75% Assistive device: Walker-rolling Max distance: 62ft   Walk 10 feet activity   Assist  Walk 10 feet activity did not occur: Safety/medical concerns  Assist level: Minimal Assistance - Patient > 75% Assistive device: Walker-rolling   Walk 50 feet activity   Assist Walk 50 feet with 2 turns activity did not occur: Safety/medical concerns         Walk  150 feet activity   Assist Walk 150 feet activity did not occur: Safety/medical concerns         Walk 10 feet on uneven surface  activity   Assist Walk 10 feet on uneven surfaces activity did not occur: Safety/medical concerns         Wheelchair     Assist Is the patient using a wheelchair?: Yes Type of Wheelchair: Manual Wheelchair activity did not occur: Refused         Wheelchair 50 feet with 2 turns activity    Assist            Wheelchair 150 feet activity     Assist  Wheelchair 150 feet  activity did not occur: Refused        Medical Problem List and Plan: 1.  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  Conservative care per neurosurgery.  -Continue CIR therapies including PT, OT, and SLP ELOS 10/12,  2.  Antithrombotics: -DVT/anticoagulation:  Mechanical: Sequential compression devices, below knee Bilateral lower extremities             -antiplatelet therapy: Aspirin 81 mg daily resumed 3. Pain Management/Gout flare right foot: Tylenol as needed             -prednisone taper, avoid tight fitting shoes and socks  Improving 4. Dementia: Continue Celexa 10 mg daily, Namenda 28 mg nightly, Continue Aricept 10 mg daily             -antipsychotic agents: N/A 5. Neuropsych: This patient is not capable of making decisions on her own behalf. 6. Skin/Wound Care: Routine skin checks             -eucerin cream BLE 7. Fluids/Electrolytes/Nutrition: Routine in and outs 8.  CKD.  Creatinine baseline 1.03-1.39.    At baseline or better  Cr uptrending, increase fluid rate to 73mLs/hr BUN improved but poor intake change IVF to nocturnal  BMP Latest Ref Rng & Units 02/24/2021 02/21/2021 02/14/2021  Glucose 70 - 99 mg/dL 140(H) 171(H) 123(H)  BUN 8 - 23 mg/dL 28(H) 25(H) 17  Creatinine 0.44 - 1.00 mg/dL 1.37(H) 1.21(H) 1.00  Sodium 135 - 145 mmol/L 138 138 135  Potassium 3.5 - 5.1 mmol/L 4.0 3.7 3.6  Chloride 98 - 111 mmol/L 101 105 102  CO2 22 - 32 mmol/L 28 24 25   Calcium 8.9 - 10.3 mg/dL 8.9 8.9 8.8(L)    9.  Diabetes mellitus.  Hemoglobin A1c 6.2.  Metformin 500 mg twice daily prior to admission and resume as needed.  CBG (last 3)  No results for input(s): GLUCAP in the last 72 hours.   10.  Klebsiella UTI.  Keflex completed.- recheck UA neg  11.  Hyperlipidemia.  Hold Zocor 20 mg daily secondary to elevated LFTs 12.  Orthostasis.  Continue Inderal 20 mg twice daily as well as low-dose Norvasc 5 mg daily initiated 02/08/2021.     Vitals:   02/25/21  0451 02/25/21 1308  BP: (!) 143/71 (!) 149/62  Pulse: 71 72  Resp: 16 16  Temp: 98.1 F (36.7 C) 99.5 F (37.5 C)  SpO2: 97% 97%   BP well controlled 10/6: continue current regimen 13. Elevated Transaminases.   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   Advanced to D3 thins, continue to advance as tolerated 15. Slow transit constipation  -prn suppository  -senna-s at HS  16.  Hx dementia on dual therapy , Baseline cognition limited  higher level (no med management or financial decisions, occ incont, sundowning with mild agitation)  LOS: 15 days A FACE TO FACE EVALUATION WAS PERFORMED  Clide Deutscher [name] 02/25/2021, 5:45 PM

## 2021-02-25 NOTE — Progress Notes (Signed)
Occupational Therapy TBI Note  Patient Details  Name: [name] MRN: [id number] Date of Birth: Jun 02, 1940  Today's Date: 02/25/2021 OT Individual Time: 0802-0905 OT Individual Time Calculation (min): 63 min    Short Term Goals: Week 3:  OT Short Term Goal 1 (Week 3): Continue working on [place] at min to max assist.  Skilled Therapeutic Interventions/Progress Updates:    Session 1 : (7253-6644)  No report of pain during session.   Pt in recliner to start, worked on self feeding and transfers with her daughter present for education.Marland Kitchen  She was able to self feed using the RUE with increased time.  Slight difficulty noted when attempting to chew up sausage after being cut up into approximately 1" sections.  She would continue to chew without swallowing it.  Therapist cut it up into smaller pieces and then she was able to manage it better.  Once complete had daughter provide min assist level assist for transfer to the wheelchair with mod instructional cueing for sequencing.  Educated on technique for helping pt scoot forward and backwards in the wheelchair when she needs assist.  Still needs continued practice with this for efficiency.  Discussed current ADL level which she is familiar with secondary to helping her prior to this admission.  Left pt with daughter at end of session with PT in for continued education.    Session 2: (0347-4259)  No report of pain during session.  Pt in recliner sleeping to start session.  She was not oriented to place or time and asked frequently where her husband was at throughout session.  Therapist was able to re-orient her however she was not able to recall within a few mins after this.  She was able to transfer stand pivot to the wheelchair and complete brushing her hair and brushing her teeth from the wheelchair.  Min assist for thoroughness with brushing the back of her hair with mod assist for setup of toothpaste on the tooth brush.  She was then able to  brush and rinse with min demonstrational cueing.  Pt continued to perseverate on wanting to know where her husband was, eventually becoming tearful.  Therapist was eventually able to redirect her and have her transfer back to the recliner from the wheelchair with overall min assist.  She was left sitting with call button in lap and with the safety belt in place.    Therapy Documentation Precautions:  Precautions Precautions: Fall Precaution Comments: confusion, decreased awareness and decreased initiation Restrictions Weight Bearing Restrictions: No  Agitated Behavior Scale: TBI Observation Details Observation Environment: pt in room Start of observation period - Date: 02/25/21 Start of observation period - Time: 0802 End of observation period - Date: 02/25/21 End of observation period - Time: 0905 Agitated Behavior Scale (DO NOT LEAVE BLANKS) Short attention span, easy distractibility, inability to concentrate: Present to a slight degree Impulsive, impatient, low tolerance for pain or frustration: Absent Uncooperative, resistant to care, demanding: Absent Violent and/or threatening violence toward people or property: Absent Explosive and/or unpredictable anger: Absent Rocking, rubbing, moaning, or other self-stimulating behavior: Absent Pulling at tubes, restraints, etc.: Absent Wandering from treatment areas: Absent Restlessness, pacing, excessive movement: Absent Repetitive behaviors, motor, and/or verbal: Present to a slight degree Rapid, loud, or excessive talking: Absent Sudden changes of mood: Absent Easily initiated or excessive crying and/or laughter: Absent Self-abusiveness, physical and/or verbal: Absent Agitated behavior scale total score: 16  ADL: See Care Tool Section for some details of mobility and selfcare  Therapy/Group: Individual Therapy  Edwar Coe OTR/L 02/25/2021, 12:33 PM

## 2021-02-25 NOTE — Progress Notes (Signed)
Physical Therapy Session Note  Patient Details  Name: [name] MRN: [id number] Date of Birth: 1941/04/14  Today's Date: 02/25/2021 PT Individual Time: 3716-9678; 1100-1153 PT Individual Time Calculation (min): 25 min and 53 mins  Short Term Goals: Week 3:  PT Short Term Goal 1 (Week 3): STG= LTG based on ELOS  Skilled Therapeutic Interventions/Progress Updates:    Session 1: Patient received sitting up in wc, handoff from OT for family ed. Dtr present. Patient denies pain. PT discussed transfers with and without AD. Sometimes with addition of RW, patient has increased difficulty motor planning. However, patients fear of falling increases without use of AD. PT demonstrated stand pivot with no AD x1 and dtr able to perform x1 as well with supervision and verbal cuing from PT. Patient ambulated x15 ft with RW + MinA. Shuffling gait remains present with anterior weight shift. PT discussed with dtr to guard patient from the back with B UE when she's ambulating. Patient most likely to fall anteriorly and with dtr guarding posteriorly to could better assist patient in regaining balance. Patient remaining up in recliner, seatbelt alarm on, call light within reach. Dtr at bedside.    Session 2: Patient received sitting up in recliner, agreeable to PT. Dtr at bedside. She denies pain. Patient able to ambulate from her room to the therapy gym (~262ft) with RW and grossly MinA with dtr providing the assist. PT supervising and following with wc. Patient much more receptive to verbal cues to take larger steps and was able to take fluid, consistent steps ~50% of the time. PT emphasizing to dtr and patient importance of using gait belt for all functional mobility. Once in therapy gym, dtr able to safely set up transfer to/from wc. Educated dtr on proper guarding and assisting patient with initiating as needed. Dtr and PT noticing that when using RW for transfers, patient is more responsive if the person assisting  begins turning the walker for her. Patient completed x2 stand pivots without AD and x2 with AD. Patient able to propel herself x56ft with wc with B UE and initially Max hand over hand progressing to supervision- very slow and inefficient. Patient ambulated additional 13ft with RW and MinA provided by dtr. Patient very fatigued at end of session. Remaining up in recliner, seatbelt alarm on, call light within reach.   Therapy Documentation Precautions:  Precautions Precautions: Fall Precaution Comments: confusion, decreased awareness and decreased initiation Restrictions Weight Bearing Restrictions: No     Therapy/Group: Individual Therapy  Karoline Caldwell, PT, DPT, CBIS  02/25/2021, 7:43 AM

## 2021-02-25 NOTE — Progress Notes (Signed)
Speech Language Pathology Weekly Progress and Session Note  Patient Details  Name: [name] MRN: [id number] Date of Birth: [birth year]  Beginning of progress report period:  02/18/2021 End of progress report period:  02/25/2021  Today's Date: 02/25/2021 SLP Individual Time: 1005-1045 SLP Individual Time Calculation (min): 40 min  Short Term Goals: Week 2: SLP Short Term Goal 1 (Week 2): Patient will follow simple one step-directions during 25% of occasions utilizing compensatory strategies with max A verbal/visual cues SLP Short Term Goal 1 - Progress (Week 2): Met SLP Short Term Goal 2 (Week 2): Patient will attend to basic and/or famliar tasks for 3 minute intervals with max A verbal cues for redirection SLP Short Term Goal 2 - Progress (Week 2): Not met SLP Short Term Goal 3 (Week 2): Patient will locate appropriate information on external aids to improve orientation to place/situation with max A multimodal cues SLP Short Term Goal 3 - Progress (Week 2): Not met SLP Short Term Goal 4 (Week 2): Pt will consume PO trials of regular textures with effective mastication and oral clearance and without overt s/sx of aspiration prior to consideration of diet advancement. SLP Short Term Goal 4 - Progress (Week 2): Met    New Short Term Goals: Week 3: SLP Short Term Goal 1 (Week 3): STG=LTG  Weekly Progress Updates:  Patient met 2/4 STG's which were modestly set for mastication of solid PO textures and simple 1-step direction following. She did not meet goal of orienting to place/time or attending to tasks. Her alertness has fluctuated and during 10/6 and 10/7 sessions, she was fatigued and at times abruptly change from being alert to asleep. SLP is recommending to continue with skilled ST to continue with family education, determine assistance level with PO diet and determine cues/strategies to elicit more consistent responses.   Intensity: Minumum of 1-2 x/day, 30 to 90 minutes Frequency:  3 to 5 out of 7 days Duration/Length of Stay: 10/11 Treatment/Interventions: Environmental controls;Internal/external aids;Cognitive remediation/compensation;Cueing hierarchy;Dysphagia/aspiration precaution training;Functional tasks;Patient/family education   Daily Session  Skilled Therapeutic Interventions: Patient seen with daughter present for skilled ST session focusing on cognitive goals and family education. Patient required frequent cues to alert and maxA to initiate response. She appeared fatigued and daughter thinks she is worn out from earlier PT session as well as nursing care just prior to session. SLP discussed recommendations with patient's daughter to manage behaviors, safely give PO's. In addition, SLP suggested trying some basic level activities that patient might enjoy (coloring, folding laundry, listening to music,etc.). Per daughter's report of how patient was performing prior to this hospital admission, as well as this SLP's observations, patient's overall function is significantly impacted by her very poor attention and fluctuating alertness. Patient's daughter looking into ALF vs SNF and SLP did verbalize agreement with this plan. (Daughter reported that prior to hospitalization, family members had to stand in front of doors to prevent patient from getting outside). Patient left in recliner with daughter present. She continues to benefit from skilled SLP intervention to maximize cognitive and swallow function prior to discharge.   General    Pain Pain Assessment Pain Scale: Faces Faces Pain Scale: No hurt  Therapy/Group: Individual Therapy  Sonia Baller, MA, CCC-SLP Speech Therapy

## 2021-02-25 NOTE — Progress Notes (Signed)
Patient ID: [name], female   DOB: [birth year], 80 y.o.   MRN: [id number]  Pt FL2 sent to [facility] at [place] [phone] (fax)

## 2021-02-26 LAB — GLUCOSE, CAPILLARY
Glucose-Capillary: 122 mg/dL — ABNORMAL HIGH (ref 70–99)
Glucose-Capillary: 180 mg/dL — ABNORMAL HIGH (ref 70–99)

## 2021-02-26 NOTE — Progress Notes (Addendum)
Occupational Therapy Session Note  Patient Details  Name: [name] MRN: [id number] Date of Birth: [birth year]  Today's Date: 02/26/2021 OT Individual Time: 1779-3903 OT Individual Time Calculation (min): 31 min    Short Term Goals: Week 2:  OT Short Term Goal 1 (Week 2): Pt will maintain sustained attention for greater than 5 mins during selfcare tasks with no more than mod instructional cueing. OT Short Term Goal 1 - Progress (Week 2): Not met OT Short Term Goal 2 (Week 2): Pt will complete at least 2 grooming tasks in sitting with no more than min assist. OT Short Term Goal 2 - Progress (Week 2): Met OT Short Term Goal 3 (Week 2): Pt complete toilet transfer with mod assist consistently using the RW and 3:1. OT Short Term Goal 3 - Progress (Week 2): Met OT Short Term Goal 4 (Week 2): Pt will complete UB dressing with min assist for donning pullover shirt. OT Short Term Goal 4 - Progress (Week 2): Met Week 3:  OT Short Term Goal 1 (Week 3): Continue working on [place] at min to max assist.  Skilled Therapeutic Interventions/Progress Updates:  Patient met asleep in recliner. Family present and in agreement with OT treatment session with focus on family education. Patient's daughter [name] present for family education session yesterday. Son and daughter in law present for family education this date. Son expressed understanding of patient current level of function. Spent time recounting patient level of function prior to admission and sharp decline in function since falls leading to this admission. Son states that he does not assist with bathing/dressing but does assist with meal prep, med management and functional transfers on occasion. Education provided on incorporation of finger foods as dementia progresses in the event patient no longer remembers how to use utensils. Son states some days patient currently does not remember how to use utensils currently. During discussion son  reports plan for patient to transition to memory care facility s/p d/c. Session concluded with patient seated in recliner with call bell within reach, belt alarm activated and all needs met. Patient missed 15 min of treatment session 2/2 lethargy.   Therapy Documentation Precautions:  Precautions Precautions: Fall Precaution Comments: confusion, decreased awareness and decreased initiation Restrictions Weight Bearing Restrictions: No General: General OT Amount of Missed Time: 14 Minutes  Therapy/Group: Individual Therapy  Ahri Olson R [place] 02/26/2021, 7:28 AM

## 2021-02-26 NOTE — Progress Notes (Signed)
Speech Language Pathology Daily Session Note  Patient Details  Name: [name] MRN: [id number] Date of Birth: June 11, 1940  Today's Date: 02/26/2021 SLP Individual Time: 1000-1030 SLP Individual Time Calculation (min): 30 min  Short Term Goals: Week 3: SLP Short Term Goal 1 (Week 3): STG=LTG  Skilled Therapeutic Interventions:   Patient seen with son and daughter in law for skilled ST session with focus on family education. Patient was not able be adequately aroused and only briefly alert during this session. SLP discussed patient's current functioning with cognitive and swallow function. Discussed with family that currently she is on regular texture solids and thin liquids and needs supervision for encouragement and initiation of eating/self-feeding. SLP also recommended limiting amount of food items on table at a time due to distraction. SLP discussed recommendations to try activities with patient that she might enjoy, such as music, keyboard/piano(patient used to play), etc. All questions answered to family's satisfaction and patient left in recliner with restraints in place and family in room.   Pain Pain Assessment Pain Scale: Faces Faces Pain Scale: No hurt  Therapy/Group: Individual Therapy  Sonia Baller, MA, CCC-SLP Speech Therapy

## 2021-02-26 NOTE — Progress Notes (Signed)
Physical Therapy Session Note  Patient Details  Name: [name] MRN: [id number] Date of Birth: March 08, 1941  Today's Date: 02/26/2021 PT Individual Time: 1124-1206 PT Individual Time Calculation (min): 42 min   Short Term Goals: Week 3:  PT Short Term Goal 1 (Week 3): STG= LTG based on ELOS  Skilled Therapeutic Interventions/Progress Updates:    Pt received sitting in recliner with her son, Jeneen Rinks, and his wife, Lattie Haw, present for family education/training. Pt agreeable to therapy session. Pt tired at beginning of session keeping eyes closed but with increased and sustained stimulation, able to maintain arousal throughout session. Family confirmed that they are exploring SNF placement after D/C from CIR; however, do not want to discuss this with the pt further at this time.  Educated family on pt's CLOF with primary impairments of delayed processing, impaired attention, impaired and delayed motor planning, impaired memory/recall and how these impact pt's overall functional mobility level. Educated on recommendation to use RW for all transfers and ambulation as this provides pt a sense of security to allow her to stand more upright with improved balance. Educated on guarding pt on R side semi-posteriorly at a diagonal to allow both anterior and posterior balance assistance and allowing assist for AD management as needed.   Therapist provided all hands-on assistance to pt throughout session with family declining participating in that at this time. Sit>stand recliner>RW with light min assist for lifting to stand - requires cuing and hand-over-hand assistance to place R hand on armrest to push up rather than pulling up on RW with both hands - pt benefits from counting to 3, twice to initiate coming to stand. Short distance ~59ft ambulatory transfer to w/c using RW with CGA and intermittent assist for turning AD to help with motor planning and initiation of turning.    Gait training ~58ft 2x in/out of  bathroom using RW with light min assist primarily when navigating up/down bathroom threshold with cuing and assistance for proper AD management. Pt continues to demo short steps appearing as though partially marching in place that is worse when turning or navigating around obstacles, but improved compared to last time seen by this therapist. Standing with UE support on RW and/or grab bar with light min assist during total assist LB clothing management and peri-care - continent of bladder and bowels.  Therapist providing education to family throughout on how to properly assist and cue patient for functional mobility tasks. Family reports no questions/concerns at this time. Pt left seated in recliner with needs in reach (soft call button in lap), seat belt alarm on, B LEs elevated, and family present.   Therapy Documentation Precautions:  Precautions Precautions: Fall Precaution Comments: confusion, decreased awareness and decreased initiation Restrictions Weight Bearing Restrictions: No   Pain:  No reports or indications of pain throughout session.   Therapy/Group: Individual Therapy  Tawana Scale , PT, DPT, NCS, CSRS 02/26/2021, 8:00 AM

## 2021-02-26 NOTE — Progress Notes (Signed)
[facility] PHYSICAL MEDICINE & REHABILITATION PROGRESS NOTE  Subjective/Complaints: No new issues overnight. Pt resting comfortably when I came in  ROS: Limited due to cognitive/behavioral   Objective: Vital Signs: Blood pressure (!) 163/58, pulse 64, temperature 98 F (36.7 C), resp. rate 18, height 5' 2.01" (1.575 m), weight 57.8 kg, SpO2 98 %. No results found. No results for input(s): WBC, HGB, HCT, PLT in the last 72 hours. Recent Labs    02/24/21 1345  NA 138  K 4.0  CL 101  CO2 28  GLUCOSE 140*  BUN 28*  CREATININE 1.37*  CALCIUM 8.9    Intake/Output Summary (Last 24 hours) at 02/26/2021 1036 Last data filed at 02/26/2021 0752 Gross per 24 hour  Intake 4895.02 ml  Output --  Net 4895.02 ml         Physical Exam: BP (!) 163/58 (BP Location: Left Arm)   Pulse 64   Temp 98 F (36.7 C)   Resp 18   Ht 5' 2.01" (1.575 m)   Wt 57.8 kg   SpO2 98%   BMI 23.30 kg/m  Constitutional: No distress . Vital signs reviewed. HEENT: NCAT, EOMI, oral membranes moist Neck: supple Cardiovascular: RRR without murmur. No JVD    Respiratory/Chest: CTA Bilaterally without wheezes or rales. Normal effort    GI/Abdomen: BS +, non-tender, non-distended Ext: no clubbing, cyanosis, or edema Psych: pleasant and cooperative  Neuro: limited insight and awareness. Follows simple commands.   Motor: UE 4-/5.  RLE: Hip flexion, knee extension 4-/5, ankle dorsiflexion 4/5 LLE: 4 -/5 proximal distal  Assessment/Plan: 1. Functional deficits which require 3+ hours per day of interdisciplinary therapy in a comprehensive inpatient rehab setting. Physiatrist is providing close team supervision and 24 hour management of active medical problems listed below. Physiatrist and rehab team continue to assess barriers to discharge/monitor patient progress toward functional and medical goals   Care Tool:  Bathing    Body parts bathed by patient: Right arm, Left arm, Chest, Abdomen, Front  perineal area, Right upper leg, Left upper leg, Face   Body parts bathed by helper: Right lower leg, Left lower leg, Buttocks Body parts n/a: Buttocks, Front perineal area   Bathing assist Assist Level: Minimal Assistance - Patient > 75%     Upper Body Dressing/Undressing Upper body dressing   What is the patient wearing?: Pull over shirt    Upper body assist Assist Level: Minimal Assistance - Patient > 75%    Lower Body Dressing/Undressing Lower body dressing      What is the patient wearing?: Pants, Incontinence brief     Lower body assist Assist for lower body dressing: Maximal Assistance - Patient 25 - 49%     Toileting Toileting    Toileting assist Assist for toileting: Maximal Assistance - Patient 25 - 49%     Transfers Chair/bed transfer  Transfers assist     Chair/bed transfer assist level: Moderate Assistance - Patient 50 - 74% Chair/bed transfer assistive device: Museum/gallery exhibitions officer assist      Assist level: Minimal Assistance - Patient > 75% Assistive device: Walker-rolling Max distance: 36ft   Walk 10 feet activity   Assist  Walk 10 feet activity did not occur: Safety/medical concerns  Assist level: Minimal Assistance - Patient > 75% Assistive device: Walker-rolling   Walk 50 feet activity   Assist Walk 50 feet with 2 turns activity did not occur: Safety/medical concerns  Walk 150 feet activity   Assist Walk 150 feet activity did not occur: Safety/medical concerns         Walk 10 feet on uneven surface  activity   Assist Walk 10 feet on uneven surfaces activity did not occur: Safety/medical concerns         Wheelchair     Assist Is the patient using a wheelchair?: Yes Type of Wheelchair: Manual Wheelchair activity did not occur: Refused         Wheelchair 50 feet with 2 turns activity    Assist            Wheelchair 150 feet activity     Assist  Wheelchair  150 feet activity did not occur: Refused        Medical Problem List and Plan: 1.  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  Conservative care per neurosurgery.  -Continue CIR therapies including PT, OT, and SLP, ELOS 10/12,  2.  Antithrombotics: -DVT/anticoagulation:  Mechanical: Sequential compression devices, below knee Bilateral lower extremities             -antiplatelet therapy: Aspirin 81 mg daily resumed 3. Pain Management/Gout flare right foot: Tylenol as needed             -prednisone taper, avoid tight fitting shoes and socks  Improving 4. Dementia: Continue Celexa 10 mg daily, Namenda 28 mg nightly, Continue Aricept 10 mg daily             -antipsychotic agents: N/A 5. Neuropsych: This patient is not capable of making decisions on her own behalf. 6. Skin/Wound Care: Routine skin checks             -eucerin cream BLE 7. Fluids/Electrolytes/Nutrition: Routine in and outs 8.  CKD.  Creatinine baseline 1.03-1.39.    At baseline or better  Cr uptrending, increase fluid rate to 51mLs/hr BUN improved but poor intake change IVF to nocturnal  BMP Latest Ref Rng & Units 02/24/2021 02/21/2021 02/14/2021  Glucose 70 - 99 mg/dL 140(H) 171(H) 123(H)  BUN 8 - 23 mg/dL 28(H) 25(H) 17  Creatinine 0.44 - 1.00 mg/dL 1.37(H) 1.21(H) 1.00  Sodium 135 - 145 mmol/L 138 138 135  Potassium 3.5 - 5.1 mmol/L 4.0 3.7 3.6  Chloride 98 - 111 mmol/L 101 105 102  CO2 22 - 32 mmol/L 28 24 25   Calcium 8.9 - 10.3 mg/dL 8.9 8.9 8.8(L)    10/8 continue IVF, check labs Monday 9.  Diabetes mellitus.  Hemoglobin A1c 6.2.  Metformin 500 mg twice daily prior to admission and resume as needed. No CBG's being checked.  10/8 All glucose reading elevated on labwork. Will order CBG's with [facility]  CBG (last 3)  No results for input(s): GLUCAP in the last 72 hours.   10.  Klebsiella UTI.  Keflex completed.- recheck UA neg  11.  Hyperlipidemia.  Hold Zocor 20 mg daily secondary to elevated  LFTs 12.  Orthostasis.  Continue Inderal 20 mg twice daily as well as low-dose Norvasc 5 mg daily initiated 02/08/2021.     Vitals:   02/26/21 0430 02/26/21 0854  BP: 136/64 (!) 163/58  Pulse: 76 64  Resp: 18   Temp: 98 F (36.7 C)   SpO2: 98%    BP under reasonable control 10/8: continue current regimen 13. Elevated Transaminases.   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   Advanced to D3 thins, continue to advance as tolerated 15. Slow transit  constipation  -prn suppository  -senna-s at HS  16.  Hx dementia on dual therapy , Baseline cognition limited higher level (no med management or financial decisions, occ incont, sundowning with mild agitation)  LOS: 16 days A FACE TO FACE EVALUATION WAS PERFORMED  [name] 02/26/2021, 10:36 AM

## 2021-02-27 LAB — GLUCOSE, CAPILLARY
Glucose-Capillary: 111 mg/dL — ABNORMAL HIGH (ref 70–99)
Glucose-Capillary: 138 mg/dL — ABNORMAL HIGH (ref 70–99)

## 2021-02-27 NOTE — Progress Notes (Signed)
Discharge Rehabilitation Medication Review by a Pharmacist  A complete drug regimen review was completed for this patient to identify any potential clinically significant medication issues.  High Risk Drug Classes Is patient taking? Indication by Medication  Antipsychotic No Depression- Celexa, Memory loss - Namenda, Aricept  Anticoagulant No   Antibiotic No   Opioid No   Antiplatelet Yes Aspirin for CVA prophylaxis  Hypoglycemics/insulin No   Vasoactive Medication Yes Norvasc, Inderal for BP  Chemotherapy No   Other No      Type of Medication Issue Identified Description of Issue Recommendation(s)  Drug Interaction(s) (clinically significant)     Duplicate Therapy     Allergy     No Medication Administration End Date     Incorrect Dose     Additional Drug Therapy Needed     Significant med changes from prior encounter (inform family/care partners about these prior to discharge).    Other       Clinically significant medication issues were identified that warrant physician communication and completion of prescribed/recommended actions by midnight of the next day:  No   Pharmacist comments: None  Time spent performing this drug regimen review (minutes):  20 minutes   Tad Moore 02/27/2021 11:53 AM

## 2021-02-27 NOTE — Discharge Summary (Signed)
Physician Discharge Summary  Patient ID: [name] MRN: [id number] DOB/AGE: 81-Jan-1942 81 y.o.  Admit date: 02/10/2021 Discharge date: 03/01/2021  Discharge Diagnoses:  Principal Problem:   Traumatic subdural hematoma Active Problems:   Transaminitis   Drug-induced liver injury CKD Dementia Diabetes mellitus Klebsiella UTI Hyperlipidemia Orthostasis  Discharged Condition: Stable  Significant Diagnostic Studies: CT HEAD WO CONTRAST (5MM)  Result Date: 02/15/2021 CLINICAL DATA:  80 year old female with history of subdural hemorrhage, follow-up evaluation. EXAM: CT HEAD WITHOUT CONTRAST TECHNIQUE: Contiguous axial images were obtained from the base of the skull through the vertex without intravenous contrast. COMPARISON:  Multiple prior studies most recent from February 03, 2021. FINDINGS: Brain: Stable ventricular size, no signs of midline shift. Mixed attenuation and diminished volume of subdural hematoma along the falx. Small extra-axial collection along the RIGHT parieto-occipital convexity predominantly intermediate density with some mixed attenuation seen posteriorly measuring approximately 7 mm. No additional new extra-axial collections. Vascular: No hyperdense vessel or unexpected calcification. Skull: Normal. Negative for fracture or focal lesion. Sinuses/Orbits: Visualized paranasal sinuses and orbits are unremarkable. Other: None IMPRESSION: Mixed attenuation and diminished volume of subdural hematoma along the falx. New subdural with predominantly low attenuation along the RIGHT parieto-occipital convexity measuring up to 0.7 cm, potentially redistribution of previously identified subdural hematoma along the falx given density and diminished volume of hematoma about the falx when compared to previous imaging. No signs of midline shift with stable ventricular size. These results will be called to the ordering clinician or representative by the Radiologist Assistant, and  communication documented in the PACS or [employer]. Electronically Signed   By: Zetta Bills M.D.   On: 02/15/2021 12:01   CT HEAD WO CONTRAST (5MM)  Result Date: 02/03/2021 CLINICAL DATA:  Subdural hematoma.  Follow-up. EXAM: CT HEAD WITHOUT CONTRAST TECHNIQUE: Contiguous axial images were obtained from the base of the skull through the vertex without intravenous contrast. COMPARISON:  CT head, 02/02/2021. FINDINGS: Brain: No evidence of acute infarction, hydrocephalus, or mass lesion/mass effect. Relatively similar size and appearance of parafalcine and tentorial extra-axial collection, accounting for mild redistribution, measuring up to 0.9 cm at the level of the falx cerebri (previously 0.9 cm when remeasured. Vascular: No hyperdense vessel or unexpected calcification. Skull: Normal. Negative for fracture or focal lesion. Incidental hyperostosis frontalis. Sinuses/Orbits: No acute finding. Mild paranasal sinus mucosal thickening. Other: Repaired LEFT posterior scalp laceration, with trace subgaleal hematoma. IMPRESSION: Since CT head dated 02/02/2021 at 1857 hours; 1. No new or enlarging intracranial hemorrhage. 2. Similar size and appearance of subdural hematoma along falx and tentorium, measuring up to 0.9 cm. 3. Repair of LEFT posterior scalp laceration, with trace subgaleal hematoma. Electronically Signed   By: [name] M.D.   On: 02/03/2021 09:51   CT HEAD WO CONTRAST (5MM)  Result Date: 02/02/2021 CLINICAL DATA:  Recurrent fall, new occipital laceration. EXAM: CT HEAD WITHOUT CONTRAST CT CERVICAL SPINE WITHOUT CONTRAST TECHNIQUE: Multidetector CT imaging of the head and cervical spine was performed following the standard protocol without intravenous contrast. Multiplanar CT image reconstructions of the cervical spine were also generated. COMPARISON:  CT head and cervical spine 02/01/2021 FINDINGS: CT HEAD FINDINGS BRAIN: BRAIN Patchy and confluent areas of decreased attenuation are  noted throughout the deep and periventricular white matter of the cerebral hemispheres bilaterally, compatible with chronic microvascular ischemic disease. No evidence of large-territorial acute infarction. No parenchymal hemorrhage. No mass lesion. Interval development of a right parafalcine hyperdense extra-axial fluid extending along the right tentorium. On  coronal this measures up to 8 mm along the falx cerebral (4:28). No mass effect or midline shift. No hydrocephalus. Basilar cisterns are patent. Vascular: No hyperdense vessel. Atherosclerotic calcifications are present within the cavernous internal carotid arteries. Skull: No acute fracture or focal lesion. Sinuses/Orbits: Paranasal sinuses and mastoid air cells are clear. The orbits are unremarkable. Other: Left occipital scalp 12 mm hematoma with associated subcutaneus soft tissue edema and emphysema. No retained radiopaque foreign body. CT CERVICAL SPINE FINDINGS Alignment: Grade 1 anterolisthesis of C3 on C4. Skull base and vertebrae: C4 through C7 anterior and interbody fusion. Multilevel severe degenerative changes of the spine. Severe bilateral osseous neural foraminal stenosis at the C3-C4 level. No acute fracture. No aggressive appearing focal osseous lesion or focal pathologic process. Soft tissues and spinal canal: No prevertebral fluid or swelling. No visible canal hematoma. Upper chest: Unremarkable. Other: None. IMPRESSION: 1. An 8 mm right parafalcine subdural hemorrhage extending inferiorly along the right tentorium. 2. Left occipital scalp 12 mm hematoma with no underlying calvarial fracture. 3. No acute displaced fracture or traumatic listhesis of the cervical spine. 4. C4 - C7 anterior and interbody fusion. Multilevel severe degenerative changes of the spine with severe bilateral osseous neural foraminal stenosis at the C3-C4 level. These results were called by telephone at the time of interpretation on 02/02/2021 at 7:26 pm to provider  [facility] , who verbally acknowledged these results. Electronically Signed   By: Iven Finn M.D.   On: 02/02/2021 19:29   CT Head Wo Contrast  Result Date: 02/01/2021 CLINICAL DATA:  Head and neck trauma. EXAM: CT HEAD WITHOUT CONTRAST CT CERVICAL SPINE WITHOUT CONTRAST TECHNIQUE: Multidetector CT imaging of the head and cervical spine was performed following the standard protocol without intravenous contrast. Multiplanar CT image reconstructions of the cervical spine were also generated. COMPARISON:  December 16, 2020. FINDINGS: CT HEAD FINDINGS Brain: Mild chronic ischemic white matter disease is noted. No mass effect or midline shift is noted. Ventricular size is within normal limits. There is no evidence of mass lesion, hemorrhage or acute infarction. Vascular: No hyperdense vessel or unexpected calcification. Skull: Normal. Negative for fracture or focal lesion. Sinuses/Orbits: No acute finding. Other: Small left posterior scalp hematoma is noted. CT CERVICAL SPINE FINDINGS Alignment: Mild grade 1 anterolisthesis of C3-4 is noted secondary to posterior facet joint hypertrophy. Skull base and vertebrae: No acute fracture. No primary bone lesion or focal pathologic process. Soft tissues and spinal canal: No prevertebral fluid or swelling. No visible canal hematoma. Disc levels: Status post surgical anterior fusion of C4-5, C5-6 and C6-7. Severe degenerative disc disease is noted at C3-4 and C7-T1. Upper chest: Negative. Other: None. IMPRESSION: Small left posterior scalp hematoma. No acute intracranial abnormality seen. Postsurgical and degenerative changes are noted in the cervical spine as described above. No acute abnormality is noted. Electronically Signed   By: Marijo Conception M.D.   On: 02/01/2021 15:00   CT Cervical Spine Wo Contrast  Result Date: 02/02/2021 CLINICAL DATA:  Recurrent fall, new occipital laceration. EXAM: CT HEAD WITHOUT CONTRAST CT CERVICAL SPINE WITHOUT CONTRAST  TECHNIQUE: Multidetector CT imaging of the head and cervical spine was performed following the standard protocol without intravenous contrast. Multiplanar CT image reconstructions of the cervical spine were also generated. COMPARISON:  CT head and cervical spine 02/01/2021 FINDINGS: CT HEAD FINDINGS BRAIN: BRAIN Patchy and confluent areas of decreased attenuation are noted throughout the deep and periventricular white matter of the cerebral hemispheres bilaterally, compatible with chronic  microvascular ischemic disease. No evidence of large-territorial acute infarction. No parenchymal hemorrhage. No mass lesion. Interval development of a right parafalcine hyperdense extra-axial fluid extending along the right tentorium. On coronal this measures up to 8 mm along the falx cerebral (4:28). No mass effect or midline shift. No hydrocephalus. Basilar cisterns are patent. Vascular: No hyperdense vessel. Atherosclerotic calcifications are present within the cavernous internal carotid arteries. Skull: No acute fracture or focal lesion. Sinuses/Orbits: Paranasal sinuses and mastoid air cells are clear. The orbits are unremarkable. Other: Left occipital scalp 12 mm hematoma with associated subcutaneus soft tissue edema and emphysema. No retained radiopaque foreign body. CT CERVICAL SPINE FINDINGS Alignment: Grade 1 anterolisthesis of C3 on C4. Skull base and vertebrae: C4 through C7 anterior and interbody fusion. Multilevel severe degenerative changes of the spine. Severe bilateral osseous neural foraminal stenosis at the C3-C4 level. No acute fracture. No aggressive appearing focal osseous lesion or focal pathologic process. Soft tissues and spinal canal: No prevertebral fluid or swelling. No visible canal hematoma. Upper chest: Unremarkable. Other: None. IMPRESSION: 1. An 8 mm right parafalcine subdural hemorrhage extending inferiorly along the right tentorium. 2. Left occipital scalp 12 mm hematoma with no underlying  calvarial fracture. 3. No acute displaced fracture or traumatic listhesis of the cervical spine. 4. C4 - C7 anterior and interbody fusion. Multilevel severe degenerative changes of the spine with severe bilateral osseous neural foraminal stenosis at the C3-C4 level. These results were called by telephone at the time of interpretation on 02/02/2021 at 7:26 pm to provider [facility] , who verbally acknowledged these results. Electronically Signed   By: Iven Finn M.D.   On: 02/02/2021 19:29   CT Cervical Spine Wo Contrast  Result Date: 02/01/2021 CLINICAL DATA:  Head and neck trauma. EXAM: CT HEAD WITHOUT CONTRAST CT CERVICAL SPINE WITHOUT CONTRAST TECHNIQUE: Multidetector CT imaging of the head and cervical spine was performed following the standard protocol without intravenous contrast. Multiplanar CT image reconstructions of the cervical spine were also generated. COMPARISON:  December 16, 2020. FINDINGS: CT HEAD FINDINGS Brain: Mild chronic ischemic white matter disease is noted. No mass effect or midline shift is noted. Ventricular size is within normal limits. There is no evidence of mass lesion, hemorrhage or acute infarction. Vascular: No hyperdense vessel or unexpected calcification. Skull: Normal. Negative for fracture or focal lesion. Sinuses/Orbits: No acute finding. Other: Small left posterior scalp hematoma is noted. CT CERVICAL SPINE FINDINGS Alignment: Mild grade 1 anterolisthesis of C3-4 is noted secondary to posterior facet joint hypertrophy. Skull base and vertebrae: No acute fracture. No primary bone lesion or focal pathologic process. Soft tissues and spinal canal: No prevertebral fluid or swelling. No visible canal hematoma. Disc levels: Status post surgical anterior fusion of C4-5, C5-6 and C6-7. Severe degenerative disc disease is noted at C3-4 and C7-T1. Upper chest: Negative. Other: None. IMPRESSION: Small left posterior scalp hematoma. No acute intracranial abnormality seen.  Postsurgical and degenerative changes are noted in the cervical spine as described above. No acute abnormality is noted. Electronically Signed   By: Marijo Conception M.D.   On: 02/01/2021 15:00   US Abdomen Limited RUQ (LIVER/GB)  Result Date: 02/07/2021 CLINICAL DATA:  Elevated liver function tests. EXAM: ULTRASOUND ABDOMEN LIMITED RIGHT UPPER QUADRANT COMPARISON:  None. FINDINGS: Gallbladder: No gallbladder wall thickening or shadowing gallstone. Small echogenic focus along the posterior wall noted on image 28 could be a sessile polyp or some sludge. The significance is doubtful. Common bile duct: Diameter: 1.5 mm.  Normal. Liver: No focal lesion identified. Within normal limits in parenchymal echogenicity. Portal vein is patent on color Doppler imaging with normal direction of blood flow towards the liver. Other: None. IMPRESSION: Normal study with exception of an 8 mm echogenic focus along the posterior wall of the gallbladder image 28. This could be some sludge or a sessile polyp. The significance is doubtful. Electronically Signed   By: Nelson Chimes M.D.   On: 02/07/2021 09:54    Labs:  Basic Metabolic Panel: Recent Labs  Lab 02/24/21 1345  NA 138  K 4.0  CL 101  CO2 28  GLUCOSE 140*  BUN 28*  CREATININE 1.37*  CALCIUM 8.9    CBC: No results for input(s): WBC, NEUTROABS, HGB, HCT, MCV, PLT in the last 168 hours.   CBG: Recent Labs  Lab 02/27/21 1145 02/27/21 1635 02/28/21 0631 02/28/21 1151 02/28/21 1650  GLUCAP 111* 138* 99 106* 139*   Family history.  Mother with Alzheimer's disease as well as diabetes mellitus.  Father with CAD as well as hypertension.  Negative for colon cancer or breast cancer  Brief HPI:   [name] is a 80 y.o. right-handed female with history of memory loss maintained on Namenda as well as Aricept, diabetes mellitus, hypertension, CKD stage III, ACDF 12/22/2014, quit smoking 25 years ago.  Per chart review lives with spouse.  Was using a rolling  walker prior to admission as well as reported recent falls.  Husband does assist with ADLs.  Presented 02/02/2021 with recurrent falls.  Spouse does have a part-time job and family provide supervision when he is out.  She is found to have an abrasion on the back of her head that did require 5 staples in the ED with cranial CT scan showing left occipital scalp 12 mm hematoma with no underlying calvarial fracture as well as 8 mm right parafalcine subdural hemorrhage extending inferiorly along the right tentorium.  CT cervical spine negative.  Admission chemistries unremarkable except glucose 154 creatinine 1.03 hemoglobin 10.5 urine culture greater 100,000 Klebsiella placed on Keflex completing course of antibiotics.  Follow-up neurosurgery Dr. Duffy Rhody in regards to traumatic SDH advised conservative care with latest cranial CT scan showing no new or enlarging intracranial hemorrhage.  Patient was on low-dose aspirin 81 mg daily prior to admission advised to hold x1 week.  Bouts of orthostasis HCTZ was initially held.  Findings of elevated LFTs 02/07/2021 and monitored with ultrasound right upper quadrant showing an 8 mm GB polyp.  Her hep C antibody was positive.  Patient asymptomatic and reviewed with gastroenterology services felt elevated LFTs related to recent antibiotic therapy for UTI although alkaline phosphatase appears to be elevated since March 2020.  Consideration for HIDA scan if remains elevated.  Patient's LFTs continue to trend downward and monitored.  She is maintained on mechanical soft dysphagia #2 thin liquid diet.  Therapy evaluations completed due to patient decreased functional ability cognitive deficits was admitted for a comprehensive rehab program.   Hospital Course: [name] was admitted to rehab 02/10/2021 for inpatient therapies to consist of PT, ST and OT at least three hours five days a week. Past admission physiatrist, therapy team and rehab RN have worked together to  provide customized collaborative inpatient rehab.  Pertain to patient's traumatic SDH conservative care followed by neurosurgery with latest follow-up cranial CT scan 02/15/2021 reviewed by Dr. Duffy Rhody and patient was cleared to resume low-dose aspirin therapy as prior to admission.  Blood pressure monitored  for orthostasis maintained on low-dose Norvasc's as well as Inderal.  Mood stabilization with Aricept and Namenda as prior to admission she also continued on Celexa.  CKD stage III with latest creatinine 1.37 she did receive some IV fluids to maintain hydration.  Blood sugars monitored hemoglobin A1c 6.2.  Hospital course complicated by Klebsiella UTI she did complete a course of Keflex remaining afebrile.  In regards to hyperlipidemia her Zocor was on hold due to elevated LFTs followed by gastroenterology services with latest panel 02/14/2021 improving.   Blood pressures were monitored on TID basis and soft and monitored  Diabetes has been monitored with ac/hs CBG checks and SSI was use prn for tighter BS control.    Rehab course: During patient's stay in rehab weekly team conferences were held to monitor patient's progress, set goals and discuss barriers to discharge. At admission, patient required minimal assist 100 feet rolling walker minimal assist stand pivot transfers  Physical exam.  Blood pressure 128/82 pulse 69 temperature 98.5 respirations 18 oxygen saturation 95% room air Constitutional.  No acute distress HEENT.  Scalp incision clean and dry Eyes.  Pupils round and reactive to light no discharge without nystagmus Neck.  Supple nontender no JVD without thyromegaly Cardiac regular rate rhythm not extra sounds or murmur heard Abdomen.  Soft nontender positive bowel sounds without rebound Respiratory effort normal no respiratory distress without wheeze Skin.  Both lower extremities and feet were very dry Neurologic.  Alert made eye contact with examiner follows simple commands.   Provides her name only.  Patient pleasantly confused and tearful at times.  Upper extremities 4/5.  Lower extremities 3-4/5 proximal to 4/5 distal right lower extremity limited by pain  He/She  has had improvement in activity tolerance, balance, postural control as well as ability to compensate for deficits. He/She has had improvement in functional use RUE/LUE  and RLE/LLE as well as improvement in awareness.  Sit to stand rolling walker light min assist for lifting.  Needed some encouragement to anticipate as well as cues to maintain focus and attention.  Ambulates 15 feet x 2 using rolling walker.  Patient demonstrated short steps appearing as though partially marching in place.  Ongoing family teaching with ADLs.  She was able to complete upper body bathing with supervision to minimal assist and then needs mod max assist for lower body bathing and dressing.  She continued to demonstrate limited sustained attention to task.  Transfers at a min mod assist level with rolling walker.  Speech therapy follow-up for dysphagia and her diet has been advanced to regular.  Full family teaching completed plan and plan for discharge to assisted living facility       Disposition: Discharged to home versus family meeting with social worker on possible need for skilled nursing facility    Diet: Regular  Special Instructions: No driving smoking or alcohol  Medications at discharge 1.  Norvasc 5 mg p.o. daily 2.  Aspirin 81 mg p.o. daily 3.  Celexa 10 mg p.o. daily 4.  Namenda 28 mg nightly 5.  Aricept 10 mg p.o. nightly 6.  Inderal 20 mg p.o. twice daily 7.  Senokot S2 tablets nightly  30-35 minutes were spent completing discharge summary and discharge planning  Discharge Instructions     Ambulatory referral to Physical Medicine Rehab   Complete by: As directed    Moderate complexity follow-up 1 to 2 weeks traumatic SDH        Follow-up Information     Kirsteins,  Luanna Salk, MD Follow up.    Specialty: Physical Medicine and Rehabilitation Why: Office to call for appointment Contact information: [place] [address] [phone]         Vallarie Mare, MD Follow up.   Specialty: Neurosurgery Why: Call for appointment Contact information: [place] [address] [phone]         Gatha Mayer, MD Follow up.   Specialty: Gastroenterology Why: Call for appointment Contact information: [address] [address] [phone]                 Signed: Lavon Paganini [place] 03/01/2021, 5:12 AM

## 2021-02-27 NOTE — Progress Notes (Signed)
Tuberculin PPD test read on Left forearm. 30mm, negative.

## 2021-02-27 NOTE — Progress Notes (Signed)
Pt. Was up most of the night with confusion and decrease awareness. Pt. Was calling names of family members. Appears disoriented to place and situation. Redirected pt. And provided safety.

## 2021-02-28 LAB — GLUCOSE, CAPILLARY
Glucose-Capillary: 99 mg/dL (ref 70–99)
Glucose-Capillary: 106 mg/dL — ABNORMAL HIGH (ref 70–99)
Glucose-Capillary: 139 mg/dL — ABNORMAL HIGH (ref 70–99)

## 2021-02-28 MED ORDER — CITALOPRAM HYDROBROMIDE 10 MG PO TABS: 10.0000 mg | ORAL_TABLET | Freq: Every day | ORAL | 0 refills | Status: DC

## 2021-02-28 MED ORDER — AMLODIPINE BESYLATE 5 MG PO TABS: 5.0000 mg | ORAL_TABLET | Freq: Every day | ORAL | 0 refills | Status: DC

## 2021-02-28 MED ORDER — PROPRANOLOL HCL 20 MG PO TABS: 20.0000 mg | ORAL_TABLET | Freq: Two times a day (BID) | ORAL | 0 refills | Status: DC

## 2021-02-28 MED ORDER — NAMZARIC 28-10 MG PO CP24: ORAL_CAPSULE | ORAL | 1 refills | Status: DC

## 2021-02-28 NOTE — Progress Notes (Signed)
Patient ID: [name], female   DOB: 1940/12/22, 80 y.o.   MRN: [id number]  Wheelchair ordered through [employer], [place]

## 2021-02-28 NOTE — Progress Notes (Signed)
Speech Language Pathology Discharge Summary  Patient Details  Name: [name] MRN: [id number] Date of Birth: 08/20/1940  Today's Date: 02/28/2021 SLP Individual Time: 1330-1400 SLP Individual Time Calculation (min): 30 min   Skilled Therapeutic Interventions:  Skilled treatment session focused on cognitive goals. SLP facilitated session by providing Max verbal and visual cues for patient to utilize external aids for orientation to place, time and situation. SLP also facilitated session by providing overall Min A verbal and visual cues for sustained attention and problem solving during a rote calendar task in which she had to put all 31 numbers on the calendar in order. Patient's sister-in-law present and provided support and encouragement. Patient left upright in recliner with alarm on and all needs within reach.      Patient has met 5 of 5 long term goals.  Patient to discharge at overall Max level.   Reasons goals not met: N/A   Clinical Impression/Discharge Summary: Patient has made slow and minimal gains and has met 5 of 5 LTGs this admission. Currently, patient is consuming regular textures with thin liquids with minimal overt s/s of aspiration with supervision level verbal cues for use of swallowing compensatory strategies. Patient's overall cognitive-linguistic functioning is fluctuates throughout the day but overall, patient requires Max A multimodal cues to complete functional and familiar task safely in regards to orientation and attention. Patient can express her basic wants/needs at the phrase level with extra time secondary to dyslfuencies and word-finding deficits. Patient and family education is complete. Patient's family is unable to provide the necessary cognitive and physical assistance needed at this time, therefore, patient will discharge to an ALF. Patient would benefit from f/u SLP services to maximize her swallowing, communication and cognitive functioning in order to  reduce caregiver burden.   Care Partner:  Caregiver Able to Provide Assistance: No  Type of Caregiver Assistance: Physical;Cognitive  Recommendation:  24 hour supervision/assistance;ALF  Rationale for SLP Follow Up: Reduce caregiver burden;Maximize cognitive function and independence;Maximize swallowing safety;Maximize functional communication   Equipment: N/A   Reasons for discharge: Discharged from hospital   Patient/Family Agrees with Progress Made and Goals Achieved: Yes    [place], [place] 02/28/2021, 3:02 PM

## 2021-02-28 NOTE — [place] FL2 (Signed)
[place] MEDICAID FL2 LEVEL OF CARE SCREENING TOOL     IDENTIFICATION  Patient Name: [name] Birthdate: September 19, 1940 Sex: female Admission Date (Current Location): 02/10/2021  [facility] and [place] Number:  Herbalist and Address:  [facility] [place], [place] [address] [place], [place] [address]      Provider Number:    Attending Physician Name and Address:  Charlett Blake, MD  Relative Name and Phone Number:  Casimer Lanius [phone]    Current Level of Care: Hospital Recommended Level of Care: ALF Prior Approval Number:    Date Approved/Denied:   PASRR Number: [id number]  Discharge Plan: ALF    Current Diagnoses: Patient Active Problem List   Diagnosis Date Noted   Drug-induced liver injury    Transaminitis    Traumatic subdural hematoma 02/10/2021   Pressure injury of skin 02/04/2021   Subdural hematoma 02/03/2021   Subdural hematoma, post-traumatic 02/02/2021   Recurrent falls 02/02/2021   Laceration of occipital region of scalp 02/02/2021   LFTs abnormal 12/23/2020   Deficiency anemia 07/27/2020   Insomnia, 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   Corns and callus 07/27/2020   Chronic renal disease, stage 3, moderately decreased glomerular filtration rate (GFR) between 30-59 mL/min/1.73 square meter ([place]) 09/26/2018   Senile dementia without behavioral disturbance ([place]) 03/21/2017   Atherosclerosis of native artery of both lower extremities with intermittent claudication ([place]) 11/24/2015   Cervical stenosis of spinal canal 12/22/2014   Visit for screening mammogram 09/16/2014   Routine health maintenance 01/03/2011   Peripheral neuropathy ([place]) 08/23/2010   DM (diabetes mellitus) type II controlled, neurological manifestation ([place]) 07/21/2010   Carotid artery stenosis 12/15/2009   DEGENERATIVE DISC DISEASE, LUMBOSACRAL SPINE W/RADICULOPATHY 12/29/2008   Dyslipidemia, goal LDL below 70 02/16/2007   Gout 02/16/2007   Essential  hypertension 02/16/2007    Orientation RESPIRATION BLADDER Height & Weight        Normal Incontinent Weight: 127 lb 6.8 oz (57.8 kg) Height:  5' 2.01" (157.5 cm)  BEHAVIORAL SYMPTOMS/MOOD NEUROLOGICAL BOWEL NUTRITION STATUS      Incontinent Diet  AMBULATORY STATUS COMMUNICATION OF NEEDS Skin   Limited Assist Verbally Other (Comment) (Redness on L side bottom due to leaning on that side)                       Personal Care Assistance Level of Assistance  Bathing, Dressing, Feeding Bathing Assistance: Limited assistance Feeding assistance: Limited assistance Dressing Assistance: Limited assistance     Functional Limitations Info             [place]  PT (By licensed PT), OT (By licensed OT), Speech therapy, Bowel and bladder program     PT Frequency: 5x a week OT Frequency: 5x a week     Speech Therapy Frequency: 5x a week      Contractures      Additional Factors Info                  Current Medications (02/28/2021):  This is the current hospital active medication list Current Facility-Administered Medications  Medication Dose Route Frequency Provider Last Rate Last Admin   0.45 % sodium chloride infusion   Intravenous Continuous Raulkar, Clide Deutscher, MD 85 mL/hr at 02/27/21 2014 New Bag at 02/27/21 2014   amLODipine (NORVASC) tablet 5 mg  5 mg Oral Daily AngiulliLavon Paganini, PA-C   5 mg at 02/28/21 0759   ammonium lactate (LAC-HYDRIN) 12 %  lotion   Topical PRN Charlett Blake, MD   Given at 02/18/21 0825   aspirin chewable tablet 81 mg  81 mg Oral Daily Charlett Blake, MD   81 mg at 02/28/21 0759   bisacodyl (DULCOLAX) suppository 10 mg  10 mg Rectal Daily PRN [name], MD   10 mg at 02/12/21 0946   citalopram (CELEXA) tablet 10 mg  10 mg Oral Daily Cathlyn Parsons, PA-C   10 mg at 02/28/21 0759   memantine (NAMENDA XR) 24 hr capsule 28 mg  28 mg Oral QHS Cathlyn Parsons, PA-C   28 mg at 02/27/21 2015   And    donepezil (ARICEPT) tablet 10 mg  10 mg Oral QHS Cathlyn Parsons, PA-C   10 mg at 02/27/21 2015   lidocaine (XYLOCAINE) 5 % ointment   Topical Daily PRN Angiulli, Lavon Paganini, PA-C       mupirocin ointment (BACTROBAN) 2 % 1 application  1 application Nasal BID Cathlyn Parsons, PA-C   1 application at 60/15/61 0803   ondansetron (ZOFRAN) tablet 4 mg  4 mg Oral Q6H PRN Cathlyn Parsons, PA-C       Or   ondansetron [facility]) injection 4 mg  4 mg Intravenous Q6H PRN Angiulli, Lavon Paganini, PA-C       propranolol (INDERAL) tablet 20 mg  20 mg Oral BID Cathlyn Parsons, PA-C   20 mg at 02/28/21 5379   senna-docusate (Senokot-S) tablet 2 tablet  2 tablet Oral QHS [name], MD   2 tablet at 02/27/21 2023     Discharge Medications: Please see discharge summary for a list of discharge medications.  Relevant Imaging Results:  Relevant Lab Results:   Additional Information    Dyanne Iha

## 2021-02-28 NOTE — Progress Notes (Signed)
[facility] PHYSICAL MEDICINE & REHABILITATION PROGRESS NOTE  Subjective/Complaints:   Remains confused (but per family close to baseline ) oriented to self only , more alert this am , friendly and pleasant   ROS: Limited due to cognitive/behavioral   Objective: Vital Signs: Blood pressure (!) 155/119, pulse (!) 58, temperature 98 F (36.7 C), resp. rate 16, height 5' 2.01" (1.575 m), weight 57.8 kg, SpO2 98 %. No results found. No results for input(s): WBC, HGB, HCT, PLT in the last 72 hours. No results for input(s): NA, K, CL, CO2, GLUCOSE, BUN, CREATININE, CALCIUM in the last 72 hours.   Intake/Output Summary (Last 24 hours) at 02/28/2021 0842 Last data filed at 02/28/2021 0700 Gross per 24 hour  Intake 2278.08 ml  Output --  Net 2278.08 ml          Physical Exam: BP (!) 155/119 (BP Location: Right Arm)   Pulse (!) 58   Temp 98 F (36.7 C)   Resp 16   Ht 5' 2.01" (1.575 m)   Wt 57.8 kg   SpO2 98%   BMI 23.30 kg/m   General: No acute distress Mood and affect are appropriate Heart: Regular rate and rhythm no rubs murmurs or extra sounds Lungs: Clear to auscultation, breathing unlabored, no rales or wheezes Abdomen: Positive bowel sounds, soft nontender to palpation, nondistended Extremities: No clubbing, cyanosis, or edema Skin: No evidence of breakdown, no evidence of rash   Motor: UE 4-/5.  RLE: Hip flexion, knee extension 4-/5, ankle dorsiflexion 4/5 LLE: 4 -/5 proximal distal  Assessment/Plan: 1. Functional deficits which require 3+ hours per day of interdisciplinary therapy in a comprehensive inpatient rehab setting. Physiatrist is providing close team supervision and 24 hour management of active medical problems listed below. Physiatrist and rehab team continue to assess barriers to discharge/monitor patient progress toward functional and medical goals   Care Tool:  Bathing    Body parts bathed by patient: Right arm, Left arm, Chest, Abdomen,  Front perineal area, Right upper leg, Left upper leg, Face   Body parts bathed by helper: Right lower leg, Left lower leg, Buttocks Body parts n/a: Buttocks, Front perineal area   Bathing assist Assist Level: Minimal Assistance - Patient > 75%     Upper Body Dressing/Undressing Upper body dressing   What is the patient wearing?: Pull over shirt    Upper body assist Assist Level: Minimal Assistance - Patient > 75%    Lower Body Dressing/Undressing Lower body dressing      What is the patient wearing?: Pants, Incontinence brief     Lower body assist Assist for lower body dressing: Maximal Assistance - Patient 25 - 49%     Toileting Toileting    Toileting assist Assist for toileting: Maximal Assistance - Patient 25 - 49%     Transfers Chair/bed transfer  Transfers assist     Chair/bed transfer assist level: Minimal Assistance - Patient > 75% Chair/bed transfer assistive device: Armrests, Programmer, multimedia   Ambulation assist      Assist level: Minimal Assistance - Patient > 75% Assistive device: Walker-rolling Max distance: 75ft   Walk 10 feet activity   Assist  Walk 10 feet activity did not occur: Safety/medical concerns  Assist level: Minimal Assistance - Patient > 75% Assistive device: Walker-rolling   Walk 50 feet activity   Assist Walk 50 feet with 2 turns activity did not occur: Safety/medical concerns         Walk 150 feet  activity   Assist Walk 150 feet activity did not occur: Safety/medical concerns         Walk 10 feet on uneven surface  activity   Assist Walk 10 feet on uneven surfaces activity did not occur: Safety/medical concerns         Wheelchair     Assist Is the patient using a wheelchair?: Yes Type of Wheelchair: Manual Wheelchair activity did not occur: Refused         Wheelchair 50 feet with 2 turns activity    Assist            Wheelchair 150 feet activity     Assist   Wheelchair 150 feet activity did not occur: Refused        Medical Problem List and Plan: 1.  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  Conservative care per neurosurgery.  -Continue CIR therapies including PT, OT, and SLP, ELOS 10/12,  2.  Antithrombotics: -DVT/anticoagulation:  Mechanical: Sequential compression devices, below knee Bilateral lower extremities             -antiplatelet therapy: Aspirin 81 mg daily resumed 3. Pain Management/Gout flare right foot: Tylenol as needed             -prednisone taper, avoid tight fitting shoes and socks  Improving 4. Dementia: Continue Celexa 10 mg daily, Namenda 28 mg nightly, Continue Aricept 10 mg daily             -antipsychotic agents: N/A 5. Neuropsych: This patient is not capable of making decisions on her own behalf. 6. Skin/Wound Care: Routine skin checks             -eucerin cream BLE 7. Fluids/Electrolytes/Nutrition: Routine in and outs 8.  CKD.  Creatinine baseline 1.03-1.39.    At baseline or better  Cr uptrending, increase fluid rate to 61mLs/hr BUN improved but poor intake change IVF to nocturnal  BMP Latest Ref Rng & Units 02/24/2021 02/21/2021 02/14/2021  Glucose 70 - 99 mg/dL 140(H) 171(H) 123(H)  BUN 8 - 23 mg/dL 28(H) 25(H) 17  Creatinine 0.44 - 1.00 mg/dL 1.37(H) 1.21(H) 1.00  Sodium 135 - 145 mmol/L 138 138 135  Potassium 3.5 - 5.1 mmol/L 4.0 3.7 3.6  Chloride 98 - 111 mmol/L 101 105 102  CO2 22 - 32 mmol/L 28 24 25   Calcium 8.9 - 10.3 mg/dL 8.9 8.9 8.8(L)    10/10 check labs in am  9.  Diabetes mellitus.  Hemoglobin A1c 6.2.  Metformin 500 mg twice daily prior to admission and resume as needed. No CBG's being checked.  10/10 CBG controlled   CBG (last 3)  Recent Labs    02/27/21 1145 02/27/21 1635 02/28/21 0631  GLUCAP 111* 138* 99     10.  Klebsiella UTI.  Keflex completed.- recheck UA neg  11.  Hyperlipidemia.  Hold Zocor 20 mg daily secondary to elevated LFTs 12.  Orthostasis.   Continue Inderal 20 mg twice daily as well as low-dose Norvasc 5 mg daily initiated 02/08/2021.     Vitals:   02/27/21 1920 02/28/21 0516  BP: (!) 142/54 (!) 155/119  Pulse: 65 (!) 58  Resp: 15 16  Temp: 98.1 F (36.7 C) 98 F (36.7 C)  SpO2: 98% 98%  Elevated this am but this is an outlier , cont to monitor  13. Elevated Transaminases.  very mild only borderline high elevation of ALT , start Crestor in place of Zocor 14.  Post stroke dysphagia.  Advanced to D3 thins, continue to advance as tolerated 15. Slow transit constipation  -prn suppository  -senna-s at HS  16.  Hx dementia on dual therapy , Baseline cognition limited higher level (no med management or financial decisions, occ incont, sundowning with mild agitation)  LOS: 18 days A FACE TO FACE EVALUATION WAS PERFORMED  Charlett Blake 02/28/2021, 8:42 AM

## 2021-02-28 NOTE — Plan of Care (Signed)
Patient remains incontinent of bladder even with prompted toileting; unable to communicate needs in a timely manner.

## 2021-02-28 NOTE — Progress Notes (Signed)
Occupational Therapy Discharge Summary  Patient Details  Name: [name] MRN: [id number] Date of Birth: [birth year]  Today's Date: 02/28/2021 OT Individual Time: 1014-1100 OT Individual Time Calculation (min): 46 min   Session Note:  Pt completed transfer from the recliner to the wheelchair at min assist level using the RW for support.  She was not oriented to place, time, or situation when asked.  She was able to participate in some grooming tasks at the sink as well as UB bathing and dressing.  She needed max encouragement for active participation in bathing with step by step cueing for washing her UB.  She did donn her pullover shirt with setup when given to her.  Therapist assisted with washing and drying her lower legs and feet secondary to limited flexibility as well as applying medicated lotion.  Total assist for donning her socks and shoes as well.  Finished session with stand pivot transfer to the recliner from the wheelchair with min assist using the RW.  Mod demonstrational cueing needed to square up walker to the surface and for guiding pt around to align herself with the chair.  Safety belt in place with call button and phone in reach.  Patient has met 8 of 8 long term goals due to improved activity tolerance, improved balance, postural control, improved attention, and improved awareness.  Patient to discharge at overall Mod Assist level.  Patient's care partner unavailable to provide the necessary physical and cognitive assistance at discharge.    Reasons goals not met: NA  Recommendation:  Patient will benefit from ongoing skilled OT services in home health setting to continue to advance functional skills in the area of BADL and Reduce care partner burden.  Pt will benefit from ongoing OT services to help increased balance, initiation, and sequencing of basic selfcare tasks.  She will need 24 hr min to max assist for safety secondary to cognitive and physical impairments.    Equipment: No equipment provided  Reasons for discharge: treatment goals met and discharge from hospital  Patient/family agrees with progress made and goals achieved: Yes  OT Discharge Precautions/Restrictions  Precautions Precautions: Fall Precaution Comments: confusion, decreased awareness and decreased initiation Restrictions Weight Bearing Restrictions: No  Pain  No report of pain during session ADL ADL Eating: Set up Where Assessed-Eating: Chair Grooming: Supervision/safety Where Assessed-Grooming: Chair Upper Body Bathing: Supervision/safety Where Assessed-Upper Body Bathing: Wheelchair Lower Body Bathing: Moderate assistance Where Assessed-Lower Body Bathing: Wheelchair Upper Body Dressing: Supervision/safety Where Assessed-Upper Body Dressing: Wheelchair Lower Body Dressing: Maximal assistance Where Assessed-Lower Body Dressing: Wheelchair, Sitting at sink, Standing at sink Toileting: Moderate assistance Where Assessed-Toileting: Bedside Commode Toilet Transfer: Minimal assistance Toilet Transfer Method: Stand pivot Toilet Transfer Equipment: Bedside commode Tub/Shower Transfer: Not assessed Tub/Shower Transfer Method: Stand pivot Tub/Shower Equipment: Facilities manager: Not assessed Vision Baseline Vision/History: 1 Wears glasses Patient Visual Report: No change from baseline Vision Assessment?: No apparent visual deficits Perception  Perception: Within Functional Limits Praxis Praxis: Impaired Praxis Impairment Details: Initiation;Motor planning Cognition Overall Cognitive Status: History of cognitive impairments - at baseline Arousal/Alertness: Awake/alert Orientation Level: Oriented to person;Disoriented to place;Disoriented to time;Disoriented to situation Year:  (no value stated "I don't know".) Month: January Day of Week: Incorrect Attention: Focused;Sustained Focused Attention: Appears intact Focused Attention  Impairment: Functional basic Sustained Attention: Impaired Sustained Attention Impairment: Verbal basic;Functional basic Memory: Impaired Memory Impairment: Storage deficit;Retrieval deficit;Decreased recall of new information;Decreased short term memory Decreased Long Term Memory: Verbal basic;Functional basic Decreased  Short Term Memory: Functional basic;Verbal basic Immediate Memory Recall: Sock;Bed Memory Recall Sock: Not able to recall Memory Recall Blue: Not able to recall Memory Recall Bed: Not able to recall Awareness: Impaired Awareness Impairment: Intellectual impairment Problem Solving: Impaired Problem Solving Impairment: Verbal basic;Functional basic Executive Function:  (all impaired due to lower level deficits) Reasoning: Impaired Reasoning Impairment: Verbal basic;Functional basic Behaviors: Perseveration;Restless Safety/Judgment: Impaired [employer] Scales of Cognitive Functioning: Confused/appropriate Sensation Sensation Light Touch: Appears Intact Hot/Cold: Appears Intact Proprioception: Appears Intact Stereognosis: Appears Intact Coordination Gross Motor Movements are Fluid and Coordinated: No Fine Motor Movements are Fluid and Coordinated: No Coordination and Movement Description: Pt with slower than normal movements secondary to cognitiion. Heel Shin Test: unable to follow commands Motor  Motor Motor: Abnormal postural alignment and control;Motor perseverations;Motor impersistence;Motor apraxia Motor - Discharge Observations: rigid movement/posture, decr initiation and motor perseverations Mobility     Trunk/Postural Assessment  Cervical Assessment Cervical Assessment: Exceptions to [facility] (cervical protraction) Thoracic Assessment Thoracic Assessment: Exceptions to [facility] (kyphotic posture) Lumbar Assessment Lumbar Assessment: Exceptions to [facility] (increased lumbar flexion in standing)  Balance Balance Balance Assessed: Yes Static Sitting  Balance Static Sitting - Balance Support: No upper extremity supported Static Sitting - Level of Assistance: 5: Stand by assistance Dynamic Sitting Balance Dynamic Sitting - Balance Support: During functional activity Dynamic Sitting - Level of Assistance: 5: Stand by assistance Static Standing Balance Static Standing - Balance Support: Bilateral upper extremity supported Static Standing - Level of Assistance: 4: Min assist Dynamic Standing Balance Dynamic Standing - Balance Support: Bilateral upper extremity supported;During functional activity Dynamic Standing - Level of Assistance: 4: Min assist Extremity/Trunk Assessment RUE Assessment RUE Assessment: Exceptions to [facility] General Strength Comments: AROM WFLS for selfcare tasks, likely limitations in shoulder AROM above 110 degrees, but unable to accurately assess secondary to cognition.  Strength overall 3+/5 throughout via gross assessment. LUE Assessment LUE Assessment: Exceptions to [facility] General Strength Comments: AROM WFLS for selfcare tasks, likely limitations in shoulder AROM above 110 degrees, but unable to accurately assess secondary to cognition.  Strength overall 3+/5 throughout via gross assessment.   Ryaan Vanwagoner OTR/L 02/28/2021, 5:30 PM

## 2021-02-28 NOTE — Progress Notes (Signed)
Physical Therapy Discharge Summary  Patient Details  Name: [name] MRN: [id number] Date of Birth: March 17, 1941  Today's Date: 02/28/2021 PT Individual Time: 0800-0840; 1450-1600 PT Individual Time Calculation (min): 40 min and 70 mins   Patient has met 8 of 8 long term goals due to improved activity tolerance, improved balance, improved postural control, improved attention, and improved awareness.  Patient to discharge at an ambulatory level  MinA, but fluctuates .   Patient's care partner requires assistance to provide the necessary physical and cognitive assistance at discharge.   Recommendation:  Patient will benefit from ongoing skilled PT services in skilled nursing facility setting to continue to advance safe functional mobility, address ongoing impairments in initiation, gait progressions, dynamic balance, and minimize fall risk.  Equipment: 18x18 wc with anti tippers, leg rests and cushion; RW  Reasons for discharge: discharge from hospital  Patient/family agrees with progress made and goals achieved: Yes  PT Discharge Precautions/Restrictions Precautions Precautions: Fall Precaution Comments: confusion, decreased awareness and decreased initiation Pain Pain Assessment Pain Scale: 0-10 Pain Score: 0-No pain Pain Interference Pain Interference Pain Effect on Sleep: 8. Unable to answer Pain Interference with Therapy Activities: 8. Unable to answer Pain Interference with Day-to-Day Activities: 8. Unable to answer Vision/Perception  Vision - History Ability to See in Adequate Light: 1 Impaired Perception Perception: Within Functional Limits Praxis Praxis: Impaired Praxis Impairment Details: Ideation;Initiation;Motor planning;Perseveration  Cognition Overall Cognitive Status: History of cognitive impairments - at baseline Arousal/Alertness: Awake/alert Orientation Level: Oriented to person Attention: Sustained Focused Attention: Appears intact Focused  Attention Impairment: Functional basic Sustained Attention: Impaired Sustained Attention Impairment: Verbal basic;Functional basic Memory: Impaired Memory Impairment: Storage deficit;Retrieval deficit;Decreased recall of new information;Decreased short term memory Awareness: Impaired Awareness Impairment: Intellectual impairment Problem Solving: Impaired Problem Solving Impairment: Verbal basic;Functional basic Executive Function:  (all impaired due to lower level deficits) Behaviors: Perseveration;Restless Safety/Judgment: Impaired [employer] Scales of Cognitive Functioning: Confused/appropriate Sensation Sensation Light Touch: Appears Intact Hot/Cold: Appears Intact Proprioception: Impaired by gross assessment Stereognosis: Appears Intact Coordination Gross Motor Movements are Fluid and Coordinated: No Fine Motor Movements are Fluid and Coordinated: No Heel Shin Test: unable to follow commands Motor  Motor Motor: Abnormal postural alignment and control;Motor perseverations;Motor impersistence;Motor apraxia Motor - Discharge Observations: rigid movement/posture, decr initiation and motor perseverations  Mobility Bed Mobility Bed Mobility: Rolling Right;Rolling Left;Supine to Sit;Sit to Supine Rolling Right: Contact Guard/Touching assist Rolling Left: Contact Guard/Touching assist Supine to Sit: Contact Guard/Touching assist Sit to Supine: Contact Guard/Touching assist Transfers Transfers: Sit to Stand;Stand to Sit;Stand Pivot Transfers Sit to Stand: Minimal Assistance - Patient > 75% Stand to Sit: Minimal Assistance - Patient > 75% Stand Pivot Transfers: Minimal Assistance - Patient > 75% Stand Pivot Transfer Details: Tactile cues for sequencing;Verbal cues for technique;Verbal cues for gait pattern;Verbal cues for precautions/safety Transfer (Assistive device): Rolling walker Locomotion  Gait Ambulation: Yes Gait Assistance: Minimal Assistance - Patient >  75% Gait Distance (Feet): 250 Feet Assistive device: Rolling walker Gait Assistance Details: Verbal cues for precautions/safety;Tactile cues for sequencing;Tactile cues for initiation;Verbal cues for technique;Verbal cues for safe use of DME/AE Gait Gait: Yes Gait Pattern: Impaired Gait Pattern: Shuffle;Festinating;Narrow base of support;Trunk flexed Gait velocity: decr Stairs / Additional Locomotion Stairs: Yes Stairs Assistance: Minimal Assistance - Patient > 75% Stair Management Technique: Two rails Number of Stairs: 8 Height of Stairs: 3 Wheelchair Mobility Wheelchair Mobility: Yes Wheelchair Assistance: Maximal Assistance - Patient 25 - 49% Wheelchair Propulsion: Both upper extremities Wheelchair Parts Management: Needs  assistance Distance: 150  Trunk/Postural Assessment  Cervical Assessment Cervical Assessment: Exceptions to [facility] (rigid forward head) Thoracic Assessment Thoracic Assessment: Exceptions to [facility] (increased kyphosis/rounded shoulders) Lumbar Assessment Lumbar Assessment: Exceptions to [facility] (posterior pelvic tilt) Postural Control Postural Control: Deficits on evaluation Protective Responses: delayed  Balance Balance Balance Assessed: Yes Static Sitting Balance Static Sitting - Balance Support: No upper extremity supported Static Sitting - Level of Assistance: 5: Stand by assistance Dynamic Sitting Balance Dynamic Sitting - Balance Support: No upper extremity supported;During functional activity Dynamic Sitting - Level of Assistance:  (CGA) Static Standing Balance Static Standing - Balance Support: Bilateral upper extremity supported Static Standing - Level of Assistance: 4: Min assist Dynamic Standing Balance Dynamic Standing - Balance Support: Bilateral upper extremity supported;During functional activity Dynamic Standing - Level of Assistance: 4: Min assist Extremity Assessment      RLE Assessment RLE Assessment: Exceptions to [facility] General Strength  Comments: at least 4-/5 proximal to distal functionally. unable to perform formal MMT due to difficulties in command following LLE Assessment LLE Assessment: Exceptions to [facility] General Strength Comments: at least 4-/5 proximal to distal functionally. unable to perform formal MMT due to difficulties in command following  Session 1: Patient received sitting up in bed, agreeable to PT. She denies pain. Patient remains intermittently oriented to self. She was able to complete LB dressing with MaxA. Coming to sit edge of bed with MinA and HOB elevated. She continues to require Max verbal cues, single step, to complete basic mobility. Noted to still have delayed initiation, but not as significant as prior sessions. Patient able to complete sit <> stand with RW and MinA. Ambulating ~48f to wc with RW + MinA. PT transporting patient in wc to therapy gym for time management and energy conservation. She was able to ascend/descend x4 steps with MinA + B HR. Self-selecting reciprocal pattern when ascending and step-to when descending. Patient able to transfer into car with MinA and Max cues. Patient ambulating 514fwith RW and MinA. Small, shuffling steps with minimal foot clearance, forward flexed posture maintained. Patient minimally receptive to multi modal cues for safer, more efficient gait pattern. Patient remaining up in recliner, seatbelt alarm on, call light within reach.    Session 2: Patient received sitting up in recliner, agreeable to PT. She denies pain. Patient very disoriented and becoming agitated when SIL went to leave, but patient had to stay due to being in the hospital. PT able to reorient and redirect patient. She was able to transfer to wc with RW and CGA. PT transporting patient in wc to therapy gym for time management and energy conservation. Gait [id number] RW and CGA, minimally receptive to verbal cues for longer step length and wider BOS. Attempted gait exercises of stepping over cones,  however, patient remains with difficulty completing novel tasks, even with max multimodal cues. Patient ambulating additional 150f38fth RW and CGA. Completed NuStep x10mi50mith intermittent cuing to continue task. Patient with minimal improvement in initiation. Ambulating back to her room, up in recliner, seatbelt alarm on, call light within reach.   JenniDebbora Dus0/2022, 11:46 AM

## 2021-03-01 LAB — BASIC METABOLIC PANEL
Anion gap: 7 (ref 5–15)
BUN: 18 mg/dL (ref 8–23)
CO2: 27 mmol/L (ref 22–32)
Calcium: 9 mg/dL (ref 8.9–10.3)
Chloride: 105 mmol/L (ref 98–111)
Creatinine, Ser: 1.07 mg/dL — ABNORMAL HIGH (ref 0.44–1.00)
GFR, Estimated: 52 mL/min — ABNORMAL LOW (ref >60)
Glucose, Bld: 126 mg/dL — ABNORMAL HIGH (ref 70–99)
Potassium: 3.4 mmol/L — ABNORMAL LOW (ref 3.5–5.1)
Sodium: 139 mmol/L (ref 135–145)

## 2021-03-01 LAB — GLUCOSE, CAPILLARY: Glucose-Capillary: 120 mg/dL — ABNORMAL HIGH (ref 70–99)

## 2021-03-01 MED ORDER — POTASSIUM CHLORIDE CRYS ER 20 MEQ PO TBCR
40.0000 meq | EXTENDED_RELEASE_TABLET | Freq: Once | ORAL | Status: AC
  Administered 2021-03-01: 40 meq via ORAL
  Filled 2021-03-01: qty 2

## 2021-03-01 NOTE — Progress Notes (Signed)
Inpatient Rehabilitation Care Coordinator Discharge Note   Patient Details  Name: [name] MRN: [id number] Date of Birth: July 01, 1940   Discharge location: ALF ( [facility])  Length of Stay: 19 Days  Discharge activity level: Supervision  Home/community participation: pt daughter and son providing supervision and providing additiional supervision  Patient response PP:IRJJOA Literacy - How often do you need to have someone help you when you read instructions, pamphlets, or other written material from your doctor or pharmacy?: Always  Patient response CZ:YSAYTK Isolation - How often do you feel lonely or isolated from those around you?: Patient unable to respond  Services provided included: SW, Pharmacy, TR, CM, RN, SLP, OT, PT, RD, MD  Financial Services:  Financial Services Utilized: Medicare    Choices offered to/list presented to: ot daughter  Follow-up services arranged:  [place]: legacy         Patient response to transportation need: Is the patient able to respond to transportation needs?: No In the past 12 months, has lack of transportation kept you from medical appointments or from getting medications?: No In the past 12 months, has lack of transportation kept you from meetings, work, or from getting things needed for daily living?: No    Comments (or additional information):  Patient/Family verbalized understanding of follow-up arrangements:  Yes  Individual responsible for coordination of the follow-up plan: [name] [phone]  Confirmed correct DME delivered: Dyanne Iha 03/01/2021    Dyanne Iha

## 2021-03-01 NOTE — Progress Notes (Signed)
[place] PHYSICAL MEDICINE & REHABILITATION PROGRESS NOTE  Subjective/Complaints:  No issues overnite   Discussed labwork with pt's daughter   ROS: Limited due to cognitive/behavioral   Objective: Vital Signs: Blood pressure (!) 151/78, pulse 67, temperature 98 F (36.7 C), temperature source Oral, resp. rate 19, height 5' 2.01" (1.575 m), weight 57.8 kg, SpO2 98 %. No results found. No results for input(s): WBC, HGB, HCT, PLT in the last 72 hours. Recent Labs    03/01/21 0517  NA 139  K 3.4*  CL 105  CO2 27  GLUCOSE 126*  BUN 18  CREATININE 1.07*  CALCIUM 9.0     Intake/Output Summary (Last 24 hours) at 03/01/2021 0917 Last data filed at 03/01/2021 0700 Gross per 24 hour  Intake 220 ml  Output --  Net 220 ml          Physical Exam: BP (!) 151/78 (BP Location: Left Arm)   Pulse 67   Temp 98 F (36.7 C) (Oral)   Resp 19   Ht 5' 2.01" (1.575 m)   Wt 57.8 kg   SpO2 98%   BMI 23.30 kg/m   General: No acute distress Mood and affect are appropriate Heart: Regular rate and rhythm no rubs murmurs or extra sounds Lungs: Clear to auscultation, breathing unlabored, no rales or wheezes Abdomen: Positive bowel sounds, soft nontender to palpation, nondistended Extremities: No clubbing, cyanosis, or edema Skin: No evidence of breakdown, no evidence of rash  Motor: UE 4-/5.  RLE: Hip flexion, knee extension 4-/5, ankle dorsiflexion 4/5 LLE: 4 -/5 proximal distal  Assessment/Plan: 1. 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 Stable for D/C today to SNF F/u PCP in 3-4 weeks  See D/C summary See D/C instructions    Care Tool:  Bathing    Body parts bathed by patient: Right arm, Left arm, Chest, Abdomen, Front perineal area, Right upper leg, Left upper leg, Face   Body parts bathed by helper: Left lower leg, Right lower leg, Buttocks Body parts n/a: Buttocks, Front perineal area   Bathing assist Assist Level: Minimal Assistance - Patient > 75%     Upper  Body Dressing/Undressing Upper body dressing   What is the patient wearing?: Pull over shirt    Upper body assist Assist Level: Supervision/Verbal cueing    Lower Body Dressing/Undressing Lower body dressing      What is the patient wearing?: Pants, Incontinence brief     Lower body assist Assist for lower body dressing: Maximal Assistance - Patient 25 - 49%     Toileting Toileting    Toileting assist Assist for toileting: Moderate Assistance - Patient 50 - 74%     Transfers Chair/bed transfer  Transfers assist     Chair/bed transfer assist level: Minimal Assistance - Patient > 75% Chair/bed transfer assistive device: Armrests, Programmer, multimedia   Ambulation assist      Assist level: Minimal Assistance - Patient > 75% Assistive device: Walker-rolling Max distance: 250   Walk 10 feet activity   Assist  Walk 10 feet activity did not occur: Safety/medical concerns  Assist level: Minimal Assistance - Patient > 75% Assistive device: Walker-rolling   Walk 50 feet activity   Assist Walk 50 feet with 2 turns activity did not occur: Safety/medical concerns  Assist level: Minimal Assistance - Patient > 75% Assistive device: Walker-rolling    Walk 150 feet activity   Assist Walk 150 feet activity did not occur: Safety/medical concerns  Assist level: Minimal Assistance -  Patient > 75% Assistive device: Walker-rolling    Walk 10 feet on uneven surface  activity   Assist Walk 10 feet on uneven surfaces activity did not occur: Safety/medical concerns         Wheelchair     Assist Is the patient using a wheelchair?: Yes Type of Wheelchair: Manual Wheelchair activity did not occur: Refused  Wheelchair assist level: Moderate Assistance - Patient 50 - 74% Max wheelchair distance: 150    Wheelchair 50 feet with 2 turns activity    Assist        Assist Level: Moderate Assistance - Patient 50 - 74%   Wheelchair 150 feet  activity     Assist  Wheelchair 150 feet activity did not occur: Refused   Assist Level: Moderate Assistance - Patient 50 - 74%    Medical Problem List and Plan: 1.  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  Conservative care per neurosurgery.  -d/c to SNF today  2.  Antithrombotics: -DVT/anticoagulation:  Mechanical: Sequential compression devices, below knee Bilateral lower extremities             -antiplatelet therapy: Aspirin 81 mg daily resumed 3. Pain Management/Gout flare right foot: Tylenol as needed             -prednisone taper, avoid tight fitting shoes and socks  Improving 4. Dementia: Continue Celexa 10 mg daily, Namenda 28 mg nightly, Continue Aricept 10 mg daily             -antipsychotic agents: N/A 5. Neuropsych: This patient is not capable of making decisions on her own behalf. 6. Skin/Wound Care: Routine skin checks             -eucerin cream BLE 7. Fluids/Electrolytes/Nutrition: Routine in and outs 8.  CKD.  Creatinine baseline 1.03-1.39.    At baseline or better  Cr uptrending, increase fluid rate to 11mLs/hr BUN improved but poor intake change IVF to nocturnal  BMP Latest Ref Rng & Units 03/01/2021 02/24/2021 02/21/2021  Glucose 70 - 99 mg/dL 126(H) 140(H) 171(H)  BUN 8 - 23 mg/dL 18 28(H) 25(H)  Creatinine 0.44 - 1.00 mg/dL 1.07(H) 1.37(H) 1.21(H)  Sodium 135 - 145 mmol/L 139 138 138  Potassium 3.5 - 5.1 mmol/L 3.4(L) 4.0 3.7  Chloride 98 - 111 mmol/L 105 101 105  CO2 22 - 32 mmol/L 27 28 24   Calcium 8.9 - 10.3 mg/dL 9.0 8.9 8.9  BUN/Creat normal borderline low K+ likely from IVF , will supplement 32meq x 1 dose  9.  Diabetes mellitus.  Hemoglobin A1c 6.2.  Metformin 500 mg twice daily prior to admission and resume as needed. No CBG's being checked.  10/11 CBG controlled   CBG (last 3)  Recent Labs    02/28/21 0631 02/28/21 1151 02/28/21 1650  GLUCAP 99 106* 139*      10.  Klebsiella UTI.  Keflex completed.- recheck UA  neg  11.  Hyperlipidemia.  Hold Zocor 20 mg daily secondary to elevated LFTs 12.  Orthostasis.  Continue Inderal 20 mg twice daily as well as low-dose Norvasc 5 mg daily initiated 02/08/2021.     Vitals:   02/28/21 1908 03/01/21 0412  BP: (!) 166/63 (!) 151/78  Pulse: 78 67  Resp: 17 19  Temp: 97.8 F (36.6 C) 98 F (36.7 C)  SpO2: 99% 98%  Should normalize off IVF  13. Elevated Transaminases.  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   Advanced to D3 thins, continue to advance as tolerated 15. Slow transit constipation  -prn suppository  -senna-s at HS  16.  Hx dementia on dual therapy , Baseline cognition limited higher level (no med management or financial decisions, occ incont, sundowning with mild agitation)  LOS: 19 days A FACE TO FACE EVALUATION WAS PERFORMED  Charlett Blake 03/01/2021, 9:17 AM

## 2021-03-01 NOTE — Progress Notes (Signed)
Patient ID: [name], female   DOB: [birth year], 80 y.o.   MRN: [id number]  SW confirmed with pt family and ALF that pt will d/c today.  [place], [place]

## 2021-03-01 NOTE — Progress Notes (Signed)
INPATIENT REHABILITATION DISCHARGE NOTE   Discharge instructions by: Linna Hoff PA.  Verbalized understanding: family verbalized understanding  Skin care/Wound care: none  Pain: none  IV's: removed  Tubes/Drains: none  Safety instructions: done with pt and family  Patient belongings: sent with pt  Discharged to: ALF  Discharged via: family transport  Notes: done  Gerald Stabs, Therapist, sports

## 2021-03-02 DIAGNOSIS — R278 Other lack of coordination: Secondary | ICD-10-CM | POA: Diagnosis not present

## 2021-03-02 DIAGNOSIS — R262 Difficulty in walking, not elsewhere classified: Secondary | ICD-10-CM | POA: Diagnosis not present

## 2021-03-02 DIAGNOSIS — R2681 Unsteadiness on feet: Secondary | ICD-10-CM | POA: Diagnosis not present

## 2021-03-02 DIAGNOSIS — M6281 Muscle weakness (generalized): Secondary | ICD-10-CM | POA: Diagnosis not present

## 2021-03-02 DIAGNOSIS — R489 Unspecified symbolic dysfunctions: Secondary | ICD-10-CM | POA: Diagnosis not present

## 2021-03-03 ENCOUNTER — Telehealth: Payer: Self-pay | Admitting: Internal Medicine

## 2021-03-03 DIAGNOSIS — R262 Difficulty in walking, not elsewhere classified: Secondary | ICD-10-CM | POA: Diagnosis not present

## 2021-03-03 DIAGNOSIS — R489 Unspecified symbolic dysfunctions: Secondary | ICD-10-CM | POA: Diagnosis not present

## 2021-03-03 DIAGNOSIS — M6281 Muscle weakness (generalized): Secondary | ICD-10-CM | POA: Diagnosis not present

## 2021-03-03 DIAGNOSIS — R278 Other lack of coordination: Secondary | ICD-10-CM | POA: Diagnosis not present

## 2021-03-03 DIAGNOSIS — R2681 Unsteadiness on feet: Secondary | ICD-10-CM | POA: Diagnosis not present

## 2021-03-03 NOTE — Telephone Encounter (Signed)
Type of form received FL2  Form placed in E-fax folder  Additional instructions from the patient: patient was accepted to [facility] this week and needs FL2 form completed

## 2021-03-04 DIAGNOSIS — R262 Difficulty in walking, not elsewhere classified: Secondary | ICD-10-CM | POA: Diagnosis not present

## 2021-03-04 DIAGNOSIS — R489 Unspecified symbolic dysfunctions: Secondary | ICD-10-CM | POA: Diagnosis not present

## 2021-03-04 DIAGNOSIS — R278 Other lack of coordination: Secondary | ICD-10-CM | POA: Diagnosis not present

## 2021-03-04 DIAGNOSIS — M6281 Muscle weakness (generalized): Secondary | ICD-10-CM | POA: Diagnosis not present

## 2021-03-04 DIAGNOSIS — R2681 Unsteadiness on feet: Secondary | ICD-10-CM | POA: Diagnosis not present

## 2021-03-04 NOTE — Telephone Encounter (Signed)
Form printed and placed on PCPs desk for signature.

## 2021-03-04 NOTE — Telephone Encounter (Signed)
Form has been signed and faxed back 

## 2021-03-07 DIAGNOSIS — R489 Unspecified symbolic dysfunctions: Secondary | ICD-10-CM | POA: Diagnosis not present

## 2021-03-07 DIAGNOSIS — R262 Difficulty in walking, not elsewhere classified: Secondary | ICD-10-CM | POA: Diagnosis not present

## 2021-03-07 DIAGNOSIS — M6281 Muscle weakness (generalized): Secondary | ICD-10-CM | POA: Diagnosis not present

## 2021-03-07 DIAGNOSIS — R278 Other lack of coordination: Secondary | ICD-10-CM | POA: Diagnosis not present

## 2021-03-07 DIAGNOSIS — R2681 Unsteadiness on feet: Secondary | ICD-10-CM | POA: Diagnosis not present

## 2021-03-08 ENCOUNTER — Telehealth: Payer: Self-pay

## 2021-03-08 ENCOUNTER — Telehealth: Payer: Self-pay | Admitting: Physical Medicine & Rehabilitation

## 2021-03-08 DIAGNOSIS — M6281 Muscle weakness (generalized): Secondary | ICD-10-CM | POA: Diagnosis not present

## 2021-03-08 DIAGNOSIS — R262 Difficulty in walking, not elsewhere classified: Secondary | ICD-10-CM | POA: Diagnosis not present

## 2021-03-08 DIAGNOSIS — R2681 Unsteadiness on feet: Secondary | ICD-10-CM | POA: Diagnosis not present

## 2021-03-08 DIAGNOSIS — R278 Other lack of coordination: Secondary | ICD-10-CM | POA: Diagnosis not present

## 2021-03-08 DIAGNOSIS — R489 Unspecified symbolic dysfunctions: Secondary | ICD-10-CM | POA: Diagnosis not present

## 2021-03-08 NOTE — Telephone Encounter (Signed)
[name] from [facility] called to say the Namzaric was approve. Approval dates 03/08/21-03/08/22

## 2021-03-08 NOTE — Telephone Encounter (Signed)
Prior auth submitted for [employer] 28-[employer] to [employer] via CoverMyMeds.  Approval received Effective from 03/08/2021 through 03/08/2022.

## 2021-03-08 NOTE — Telephone Encounter (Deleted)
Prior auth submitted for [employer] 28[employer]10 to [employer] via CoverMyMeds.

## 2021-03-08 NOTE — Telephone Encounter (Signed)
Marlowe Shores PA, called to let us know that patient needs prior authorization on medication Namzaric 28-10mg  1 cap at betime.  Facility where patient is can't give this to her until prior authorization has been done.  Patient has already left hospital, if you have any questions,. Please call Linna Hoff on his cell phone.

## 2021-03-09 DIAGNOSIS — M6281 Muscle weakness (generalized): Secondary | ICD-10-CM | POA: Diagnosis not present

## 2021-03-09 DIAGNOSIS — R278 Other lack of coordination: Secondary | ICD-10-CM | POA: Diagnosis not present

## 2021-03-09 DIAGNOSIS — R2681 Unsteadiness on feet: Secondary | ICD-10-CM | POA: Diagnosis not present

## 2021-03-09 DIAGNOSIS — R262 Difficulty in walking, not elsewhere classified: Secondary | ICD-10-CM | POA: Diagnosis not present

## 2021-03-09 DIAGNOSIS — R489 Unspecified symbolic dysfunctions: Secondary | ICD-10-CM | POA: Diagnosis not present

## 2021-03-10 ENCOUNTER — Telehealth: Payer: Self-pay

## 2021-03-10 DIAGNOSIS — R278 Other lack of coordination: Secondary | ICD-10-CM | POA: Diagnosis not present

## 2021-03-10 DIAGNOSIS — R489 Unspecified symbolic dysfunctions: Secondary | ICD-10-CM | POA: Diagnosis not present

## 2021-03-10 DIAGNOSIS — M6281 Muscle weakness (generalized): Secondary | ICD-10-CM | POA: Diagnosis not present

## 2021-03-10 DIAGNOSIS — R2681 Unsteadiness on feet: Secondary | ICD-10-CM | POA: Diagnosis not present

## 2021-03-10 DIAGNOSIS — R262 Difficulty in walking, not elsewhere classified: Secondary | ICD-10-CM | POA: Diagnosis not present

## 2021-03-10 NOTE — Progress Notes (Signed)
    Chronic Care Management Pharmacy Assistant   Name: [name]  MRN: [id number] DOB: 28-Jun-1940    Reason for Encounter: Disease State   Conditions to be addressed/monitored: General  Recent office visits:  None ID  Recent consult visits:  01/21/21 Kathrynn Ducking, MD-Neurology (Senile dementia without behavioral disturbance)  Hospital visits:  Medication Reconciliation was completed by comparing discharge summary, patient's EMR and Pharmacy list, and upon discussion with patient.  Admitted to the hospital on 02/10/21 due to fall. Discharge date was 03/01/21. Discharged from [facility].    Medications that remain the same after Hospital Discharge:??  -All other medications will remain the same.    Admitted to the hospital on 02/02/21 due to hematoma. Discharge date was 02/10/21. Discharged from [facility].   Medications: Outpatient Encounter Medications as of 03/10/2021  Medication Sig   amLODipine (NORVASC) 5 MG tablet Take 1 tablet (5 mg total) by mouth daily.   aspirin 81 MG EC tablet Take 1 tablet (81 mg total) by mouth daily. Swallow whole.   citalopram (CELEXA) 10 MG tablet Take 1 tablet (10 mg total) by mouth daily.   Memantine HCl-Donepezil HCl (NAMZARIC) 28-10 MG CP24 Take 1 capsule at bedtime   propranolol (INDERAL) 20 MG tablet Take 1 tablet (20 mg total) by mouth 2 (two) times daily.   No facility-administered encounter medications on file as of 03/10/2021.   Have you had any problems recently with your health? Spoke with patient daughter [name] who stated that her mother had a fall about a month ago and is in assist living until she can get her legs strengthened  Have you had any problems with your pharmacy?She states there are not issues or problems with medications  What issues or side effects are you having with your medications? She states no side effects to medications that she is aware of  What would you like me to pass along to  Rodena Goldmann for them to help you with? She states just that patient will be in assistant living for a while  What can we do to take care of you better? Daughter states not at this time  Care Gaps: Colonoscopy- Diabetic Foot Exam- Mammogram- Ophthalmology- Dexa Scan -  Annual Well Visit -  Micro albumin- Hemoglobin A1c-   Star Rating Drugs: None ID  [place] Pharmacist Assistant [phone]

## 2021-03-11 DIAGNOSIS — R2681 Unsteadiness on feet: Secondary | ICD-10-CM | POA: Diagnosis not present

## 2021-03-11 DIAGNOSIS — R278 Other lack of coordination: Secondary | ICD-10-CM | POA: Diagnosis not present

## 2021-03-11 DIAGNOSIS — M6281 Muscle weakness (generalized): Secondary | ICD-10-CM | POA: Diagnosis not present

## 2021-03-11 DIAGNOSIS — R262 Difficulty in walking, not elsewhere classified: Secondary | ICD-10-CM | POA: Diagnosis not present

## 2021-03-11 DIAGNOSIS — R489 Unspecified symbolic dysfunctions: Secondary | ICD-10-CM | POA: Diagnosis not present

## 2021-03-14 ENCOUNTER — Encounter: Payer: Medicare Other | Admitting: Registered Nurse

## 2021-03-14 DIAGNOSIS — M6281 Muscle weakness (generalized): Secondary | ICD-10-CM | POA: Diagnosis not present

## 2021-03-14 DIAGNOSIS — R2681 Unsteadiness on feet: Secondary | ICD-10-CM | POA: Diagnosis not present

## 2021-03-14 DIAGNOSIS — R262 Difficulty in walking, not elsewhere classified: Secondary | ICD-10-CM | POA: Diagnosis not present

## 2021-03-14 DIAGNOSIS — R489 Unspecified symbolic dysfunctions: Secondary | ICD-10-CM | POA: Diagnosis not present

## 2021-03-14 DIAGNOSIS — R278 Other lack of coordination: Secondary | ICD-10-CM | POA: Diagnosis not present

## 2021-03-15 DIAGNOSIS — R489 Unspecified symbolic dysfunctions: Secondary | ICD-10-CM | POA: Diagnosis not present

## 2021-03-15 DIAGNOSIS — M6281 Muscle weakness (generalized): Secondary | ICD-10-CM | POA: Diagnosis not present

## 2021-03-15 DIAGNOSIS — R278 Other lack of coordination: Secondary | ICD-10-CM | POA: Diagnosis not present

## 2021-03-15 DIAGNOSIS — R2681 Unsteadiness on feet: Secondary | ICD-10-CM | POA: Diagnosis not present

## 2021-03-15 DIAGNOSIS — R262 Difficulty in walking, not elsewhere classified: Secondary | ICD-10-CM | POA: Diagnosis not present

## 2021-03-16 DIAGNOSIS — R489 Unspecified symbolic dysfunctions: Secondary | ICD-10-CM | POA: Diagnosis not present

## 2021-03-16 DIAGNOSIS — R278 Other lack of coordination: Secondary | ICD-10-CM | POA: Diagnosis not present

## 2021-03-16 DIAGNOSIS — R262 Difficulty in walking, not elsewhere classified: Secondary | ICD-10-CM | POA: Diagnosis not present

## 2021-03-16 DIAGNOSIS — M6281 Muscle weakness (generalized): Secondary | ICD-10-CM | POA: Diagnosis not present

## 2021-03-16 DIAGNOSIS — R2681 Unsteadiness on feet: Secondary | ICD-10-CM | POA: Diagnosis not present

## 2021-03-17 DIAGNOSIS — R278 Other lack of coordination: Secondary | ICD-10-CM | POA: Diagnosis not present

## 2021-03-17 DIAGNOSIS — R2681 Unsteadiness on feet: Secondary | ICD-10-CM | POA: Diagnosis not present

## 2021-03-17 DIAGNOSIS — M6281 Muscle weakness (generalized): Secondary | ICD-10-CM | POA: Diagnosis not present

## 2021-03-17 DIAGNOSIS — R262 Difficulty in walking, not elsewhere classified: Secondary | ICD-10-CM | POA: Diagnosis not present

## 2021-03-17 DIAGNOSIS — R489 Unspecified symbolic dysfunctions: Secondary | ICD-10-CM | POA: Diagnosis not present

## 2021-03-18 DIAGNOSIS — M6281 Muscle weakness (generalized): Secondary | ICD-10-CM | POA: Diagnosis not present

## 2021-03-18 DIAGNOSIS — R489 Unspecified symbolic dysfunctions: Secondary | ICD-10-CM | POA: Diagnosis not present

## 2021-03-18 DIAGNOSIS — R262 Difficulty in walking, not elsewhere classified: Secondary | ICD-10-CM | POA: Diagnosis not present

## 2021-03-18 DIAGNOSIS — R2681 Unsteadiness on feet: Secondary | ICD-10-CM | POA: Diagnosis not present

## 2021-03-18 DIAGNOSIS — R278 Other lack of coordination: Secondary | ICD-10-CM | POA: Diagnosis not present

## 2021-03-21 DIAGNOSIS — R489 Unspecified symbolic dysfunctions: Secondary | ICD-10-CM | POA: Diagnosis not present

## 2021-03-21 DIAGNOSIS — R278 Other lack of coordination: Secondary | ICD-10-CM | POA: Diagnosis not present

## 2021-03-21 DIAGNOSIS — R262 Difficulty in walking, not elsewhere classified: Secondary | ICD-10-CM | POA: Diagnosis not present

## 2021-03-21 DIAGNOSIS — R2681 Unsteadiness on feet: Secondary | ICD-10-CM | POA: Diagnosis not present

## 2021-03-21 DIAGNOSIS — M6281 Muscle weakness (generalized): Secondary | ICD-10-CM | POA: Diagnosis not present

## 2021-03-22 DIAGNOSIS — R262 Difficulty in walking, not elsewhere classified: Secondary | ICD-10-CM | POA: Diagnosis not present

## 2021-03-22 DIAGNOSIS — R2689 Other abnormalities of gait and mobility: Secondary | ICD-10-CM | POA: Diagnosis not present

## 2021-03-22 DIAGNOSIS — R2681 Unsteadiness on feet: Secondary | ICD-10-CM | POA: Diagnosis not present

## 2021-03-22 DIAGNOSIS — M6281 Muscle weakness (generalized): Secondary | ICD-10-CM | POA: Diagnosis not present

## 2021-03-22 DIAGNOSIS — R489 Unspecified symbolic dysfunctions: Secondary | ICD-10-CM | POA: Diagnosis not present

## 2021-03-23 DIAGNOSIS — R262 Difficulty in walking, not elsewhere classified: Secondary | ICD-10-CM | POA: Diagnosis not present

## 2021-03-23 DIAGNOSIS — R489 Unspecified symbolic dysfunctions: Secondary | ICD-10-CM | POA: Diagnosis not present

## 2021-03-23 DIAGNOSIS — R2689 Other abnormalities of gait and mobility: Secondary | ICD-10-CM | POA: Diagnosis not present

## 2021-03-23 DIAGNOSIS — M6281 Muscle weakness (generalized): Secondary | ICD-10-CM | POA: Diagnosis not present

## 2021-03-23 DIAGNOSIS — R2681 Unsteadiness on feet: Secondary | ICD-10-CM | POA: Diagnosis not present

## 2021-03-24 ENCOUNTER — Encounter: Payer: Medicare Other | Admitting: Registered Nurse

## 2021-03-24 DIAGNOSIS — R2689 Other abnormalities of gait and mobility: Secondary | ICD-10-CM | POA: Diagnosis not present

## 2021-03-24 DIAGNOSIS — R262 Difficulty in walking, not elsewhere classified: Secondary | ICD-10-CM | POA: Diagnosis not present

## 2021-03-24 DIAGNOSIS — R489 Unspecified symbolic dysfunctions: Secondary | ICD-10-CM | POA: Diagnosis not present

## 2021-03-24 DIAGNOSIS — R2681 Unsteadiness on feet: Secondary | ICD-10-CM | POA: Diagnosis not present

## 2021-03-24 DIAGNOSIS — M6281 Muscle weakness (generalized): Secondary | ICD-10-CM | POA: Diagnosis not present

## 2021-03-25 DIAGNOSIS — M6281 Muscle weakness (generalized): Secondary | ICD-10-CM | POA: Diagnosis not present

## 2021-03-25 DIAGNOSIS — R2689 Other abnormalities of gait and mobility: Secondary | ICD-10-CM | POA: Diagnosis not present

## 2021-03-25 DIAGNOSIS — R2681 Unsteadiness on feet: Secondary | ICD-10-CM | POA: Diagnosis not present

## 2021-03-25 DIAGNOSIS — R489 Unspecified symbolic dysfunctions: Secondary | ICD-10-CM | POA: Diagnosis not present

## 2021-03-25 DIAGNOSIS — R262 Difficulty in walking, not elsewhere classified: Secondary | ICD-10-CM | POA: Diagnosis not present

## 2021-03-28 DIAGNOSIS — R2689 Other abnormalities of gait and mobility: Secondary | ICD-10-CM | POA: Diagnosis not present

## 2021-03-28 DIAGNOSIS — R2681 Unsteadiness on feet: Secondary | ICD-10-CM | POA: Diagnosis not present

## 2021-03-28 DIAGNOSIS — R489 Unspecified symbolic dysfunctions: Secondary | ICD-10-CM | POA: Diagnosis not present

## 2021-03-28 DIAGNOSIS — R262 Difficulty in walking, not elsewhere classified: Secondary | ICD-10-CM | POA: Diagnosis not present

## 2021-03-28 DIAGNOSIS — M6281 Muscle weakness (generalized): Secondary | ICD-10-CM | POA: Diagnosis not present

## 2021-03-29 ENCOUNTER — Ambulatory Visit: Payer: Medicare Other | Admitting: Internal Medicine

## 2021-03-29 DIAGNOSIS — R2681 Unsteadiness on feet: Secondary | ICD-10-CM | POA: Diagnosis not present

## 2021-03-29 DIAGNOSIS — M6281 Muscle weakness (generalized): Secondary | ICD-10-CM | POA: Diagnosis not present

## 2021-03-29 DIAGNOSIS — R489 Unspecified symbolic dysfunctions: Secondary | ICD-10-CM | POA: Diagnosis not present

## 2021-03-29 DIAGNOSIS — R2689 Other abnormalities of gait and mobility: Secondary | ICD-10-CM | POA: Diagnosis not present

## 2021-03-29 DIAGNOSIS — R262 Difficulty in walking, not elsewhere classified: Secondary | ICD-10-CM | POA: Diagnosis not present

## 2021-03-30 DIAGNOSIS — R2681 Unsteadiness on feet: Secondary | ICD-10-CM | POA: Diagnosis not present

## 2021-03-30 DIAGNOSIS — R262 Difficulty in walking, not elsewhere classified: Secondary | ICD-10-CM | POA: Diagnosis not present

## 2021-03-30 DIAGNOSIS — R2689 Other abnormalities of gait and mobility: Secondary | ICD-10-CM | POA: Diagnosis not present

## 2021-03-30 DIAGNOSIS — R489 Unspecified symbolic dysfunctions: Secondary | ICD-10-CM | POA: Diagnosis not present

## 2021-03-30 DIAGNOSIS — M6281 Muscle weakness (generalized): Secondary | ICD-10-CM | POA: Diagnosis not present

## 2021-03-31 DIAGNOSIS — R2681 Unsteadiness on feet: Secondary | ICD-10-CM | POA: Diagnosis not present

## 2021-03-31 DIAGNOSIS — R262 Difficulty in walking, not elsewhere classified: Secondary | ICD-10-CM | POA: Diagnosis not present

## 2021-03-31 DIAGNOSIS — R2689 Other abnormalities of gait and mobility: Secondary | ICD-10-CM | POA: Diagnosis not present

## 2021-03-31 DIAGNOSIS — R489 Unspecified symbolic dysfunctions: Secondary | ICD-10-CM | POA: Diagnosis not present

## 2021-03-31 DIAGNOSIS — M6281 Muscle weakness (generalized): Secondary | ICD-10-CM | POA: Diagnosis not present

## 2021-04-01 DIAGNOSIS — R489 Unspecified symbolic dysfunctions: Secondary | ICD-10-CM | POA: Diagnosis not present

## 2021-04-01 DIAGNOSIS — R2681 Unsteadiness on feet: Secondary | ICD-10-CM | POA: Diagnosis not present

## 2021-04-01 DIAGNOSIS — M6281 Muscle weakness (generalized): Secondary | ICD-10-CM | POA: Diagnosis not present

## 2021-04-01 DIAGNOSIS — R262 Difficulty in walking, not elsewhere classified: Secondary | ICD-10-CM | POA: Diagnosis not present

## 2021-04-01 DIAGNOSIS — R2689 Other abnormalities of gait and mobility: Secondary | ICD-10-CM | POA: Diagnosis not present

## 2021-04-04 DIAGNOSIS — R2689 Other abnormalities of gait and mobility: Secondary | ICD-10-CM | POA: Diagnosis not present

## 2021-04-04 DIAGNOSIS — R489 Unspecified symbolic dysfunctions: Secondary | ICD-10-CM | POA: Diagnosis not present

## 2021-04-04 DIAGNOSIS — R262 Difficulty in walking, not elsewhere classified: Secondary | ICD-10-CM | POA: Diagnosis not present

## 2021-04-04 DIAGNOSIS — R2681 Unsteadiness on feet: Secondary | ICD-10-CM | POA: Diagnosis not present

## 2021-04-04 DIAGNOSIS — M6281 Muscle weakness (generalized): Secondary | ICD-10-CM | POA: Diagnosis not present

## 2021-04-05 DIAGNOSIS — R262 Difficulty in walking, not elsewhere classified: Secondary | ICD-10-CM | POA: Diagnosis not present

## 2021-04-05 DIAGNOSIS — R2689 Other abnormalities of gait and mobility: Secondary | ICD-10-CM | POA: Diagnosis not present

## 2021-04-05 DIAGNOSIS — M6281 Muscle weakness (generalized): Secondary | ICD-10-CM | POA: Diagnosis not present

## 2021-04-05 DIAGNOSIS — R489 Unspecified symbolic dysfunctions: Secondary | ICD-10-CM | POA: Diagnosis not present

## 2021-04-05 DIAGNOSIS — R2681 Unsteadiness on feet: Secondary | ICD-10-CM | POA: Diagnosis not present

## 2021-04-06 DIAGNOSIS — R262 Difficulty in walking, not elsewhere classified: Secondary | ICD-10-CM | POA: Diagnosis not present

## 2021-04-06 DIAGNOSIS — M6281 Muscle weakness (generalized): Secondary | ICD-10-CM | POA: Diagnosis not present

## 2021-04-06 DIAGNOSIS — R2689 Other abnormalities of gait and mobility: Secondary | ICD-10-CM | POA: Diagnosis not present

## 2021-04-06 DIAGNOSIS — R2681 Unsteadiness on feet: Secondary | ICD-10-CM | POA: Diagnosis not present

## 2021-04-06 DIAGNOSIS — R489 Unspecified symbolic dysfunctions: Secondary | ICD-10-CM | POA: Diagnosis not present

## 2021-04-07 DIAGNOSIS — R262 Difficulty in walking, not elsewhere classified: Secondary | ICD-10-CM | POA: Diagnosis not present

## 2021-04-07 DIAGNOSIS — R2681 Unsteadiness on feet: Secondary | ICD-10-CM | POA: Diagnosis not present

## 2021-04-07 DIAGNOSIS — R2689 Other abnormalities of gait and mobility: Secondary | ICD-10-CM | POA: Diagnosis not present

## 2021-04-07 DIAGNOSIS — M6281 Muscle weakness (generalized): Secondary | ICD-10-CM | POA: Diagnosis not present

## 2021-04-07 DIAGNOSIS — R489 Unspecified symbolic dysfunctions: Secondary | ICD-10-CM | POA: Diagnosis not present

## 2021-04-08 DIAGNOSIS — R262 Difficulty in walking, not elsewhere classified: Secondary | ICD-10-CM | POA: Diagnosis not present

## 2021-04-08 DIAGNOSIS — M6281 Muscle weakness (generalized): Secondary | ICD-10-CM | POA: Diagnosis not present

## 2021-04-08 DIAGNOSIS — R2681 Unsteadiness on feet: Secondary | ICD-10-CM | POA: Diagnosis not present

## 2021-04-08 DIAGNOSIS — R2689 Other abnormalities of gait and mobility: Secondary | ICD-10-CM | POA: Diagnosis not present

## 2021-04-08 DIAGNOSIS — R489 Unspecified symbolic dysfunctions: Secondary | ICD-10-CM | POA: Diagnosis not present

## 2021-04-09 DIAGNOSIS — R489 Unspecified symbolic dysfunctions: Secondary | ICD-10-CM | POA: Diagnosis not present

## 2021-04-09 DIAGNOSIS — R2681 Unsteadiness on feet: Secondary | ICD-10-CM | POA: Diagnosis not present

## 2021-04-09 DIAGNOSIS — R262 Difficulty in walking, not elsewhere classified: Secondary | ICD-10-CM | POA: Diagnosis not present

## 2021-04-09 DIAGNOSIS — M6281 Muscle weakness (generalized): Secondary | ICD-10-CM | POA: Diagnosis not present

## 2021-04-09 DIAGNOSIS — R2689 Other abnormalities of gait and mobility: Secondary | ICD-10-CM | POA: Diagnosis not present

## 2021-04-11 DIAGNOSIS — R2689 Other abnormalities of gait and mobility: Secondary | ICD-10-CM | POA: Diagnosis not present

## 2021-04-11 DIAGNOSIS — R489 Unspecified symbolic dysfunctions: Secondary | ICD-10-CM | POA: Diagnosis not present

## 2021-04-11 DIAGNOSIS — R2681 Unsteadiness on feet: Secondary | ICD-10-CM | POA: Diagnosis not present

## 2021-04-11 DIAGNOSIS — M6281 Muscle weakness (generalized): Secondary | ICD-10-CM | POA: Diagnosis not present

## 2021-04-11 DIAGNOSIS — Z23 Encounter for immunization: Secondary | ICD-10-CM | POA: Diagnosis not present

## 2021-04-11 DIAGNOSIS — R262 Difficulty in walking, not elsewhere classified: Secondary | ICD-10-CM | POA: Diagnosis not present

## 2021-04-12 DIAGNOSIS — R262 Difficulty in walking, not elsewhere classified: Secondary | ICD-10-CM | POA: Diagnosis not present

## 2021-04-12 DIAGNOSIS — M6281 Muscle weakness (generalized): Secondary | ICD-10-CM | POA: Diagnosis not present

## 2021-04-12 DIAGNOSIS — R489 Unspecified symbolic dysfunctions: Secondary | ICD-10-CM | POA: Diagnosis not present

## 2021-04-12 DIAGNOSIS — R2681 Unsteadiness on feet: Secondary | ICD-10-CM | POA: Diagnosis not present

## 2021-04-12 DIAGNOSIS — R2689 Other abnormalities of gait and mobility: Secondary | ICD-10-CM | POA: Diagnosis not present

## 2021-04-13 DIAGNOSIS — R2681 Unsteadiness on feet: Secondary | ICD-10-CM | POA: Diagnosis not present

## 2021-04-13 DIAGNOSIS — R262 Difficulty in walking, not elsewhere classified: Secondary | ICD-10-CM | POA: Diagnosis not present

## 2021-04-13 DIAGNOSIS — R2689 Other abnormalities of gait and mobility: Secondary | ICD-10-CM | POA: Diagnosis not present

## 2021-04-13 DIAGNOSIS — M6281 Muscle weakness (generalized): Secondary | ICD-10-CM | POA: Diagnosis not present

## 2021-04-13 DIAGNOSIS — R489 Unspecified symbolic dysfunctions: Secondary | ICD-10-CM | POA: Diagnosis not present

## 2021-04-15 DIAGNOSIS — R2689 Other abnormalities of gait and mobility: Secondary | ICD-10-CM | POA: Diagnosis not present

## 2021-04-15 DIAGNOSIS — R262 Difficulty in walking, not elsewhere classified: Secondary | ICD-10-CM | POA: Diagnosis not present

## 2021-04-15 DIAGNOSIS — M6281 Muscle weakness (generalized): Secondary | ICD-10-CM | POA: Diagnosis not present

## 2021-04-15 DIAGNOSIS — R489 Unspecified symbolic dysfunctions: Secondary | ICD-10-CM | POA: Diagnosis not present

## 2021-04-15 DIAGNOSIS — R2681 Unsteadiness on feet: Secondary | ICD-10-CM | POA: Diagnosis not present

## 2021-04-18 DIAGNOSIS — R2681 Unsteadiness on feet: Secondary | ICD-10-CM | POA: Diagnosis not present

## 2021-04-18 DIAGNOSIS — R2689 Other abnormalities of gait and mobility: Secondary | ICD-10-CM | POA: Diagnosis not present

## 2021-04-18 DIAGNOSIS — R489 Unspecified symbolic dysfunctions: Secondary | ICD-10-CM | POA: Diagnosis not present

## 2021-04-18 DIAGNOSIS — M6281 Muscle weakness (generalized): Secondary | ICD-10-CM | POA: Diagnosis not present

## 2021-04-18 DIAGNOSIS — R262 Difficulty in walking, not elsewhere classified: Secondary | ICD-10-CM | POA: Diagnosis not present

## 2021-04-19 DIAGNOSIS — R489 Unspecified symbolic dysfunctions: Secondary | ICD-10-CM | POA: Diagnosis not present

## 2021-04-19 DIAGNOSIS — R2689 Other abnormalities of gait and mobility: Secondary | ICD-10-CM | POA: Diagnosis not present

## 2021-04-19 DIAGNOSIS — M6281 Muscle weakness (generalized): Secondary | ICD-10-CM | POA: Diagnosis not present

## 2021-04-19 DIAGNOSIS — R262 Difficulty in walking, not elsewhere classified: Secondary | ICD-10-CM | POA: Diagnosis not present

## 2021-04-19 DIAGNOSIS — R2681 Unsteadiness on feet: Secondary | ICD-10-CM | POA: Diagnosis not present

## 2021-04-20 DIAGNOSIS — M6281 Muscle weakness (generalized): Secondary | ICD-10-CM | POA: Diagnosis not present

## 2021-04-20 DIAGNOSIS — R489 Unspecified symbolic dysfunctions: Secondary | ICD-10-CM | POA: Diagnosis not present

## 2021-04-20 DIAGNOSIS — R262 Difficulty in walking, not elsewhere classified: Secondary | ICD-10-CM | POA: Diagnosis not present

## 2021-04-20 DIAGNOSIS — R2681 Unsteadiness on feet: Secondary | ICD-10-CM | POA: Diagnosis not present

## 2021-04-20 DIAGNOSIS — R2689 Other abnormalities of gait and mobility: Secondary | ICD-10-CM | POA: Diagnosis not present

## 2021-04-21 DIAGNOSIS — R489 Unspecified symbolic dysfunctions: Secondary | ICD-10-CM | POA: Diagnosis not present

## 2021-04-21 DIAGNOSIS — R278 Other lack of coordination: Secondary | ICD-10-CM | POA: Diagnosis not present

## 2021-04-21 DIAGNOSIS — R262 Difficulty in walking, not elsewhere classified: Secondary | ICD-10-CM | POA: Diagnosis not present

## 2021-04-21 DIAGNOSIS — R2681 Unsteadiness on feet: Secondary | ICD-10-CM | POA: Diagnosis not present

## 2021-04-21 DIAGNOSIS — M6281 Muscle weakness (generalized): Secondary | ICD-10-CM | POA: Diagnosis not present

## 2021-04-22 DIAGNOSIS — R489 Unspecified symbolic dysfunctions: Secondary | ICD-10-CM | POA: Diagnosis not present

## 2021-04-22 DIAGNOSIS — R2681 Unsteadiness on feet: Secondary | ICD-10-CM | POA: Diagnosis not present

## 2021-04-22 DIAGNOSIS — R262 Difficulty in walking, not elsewhere classified: Secondary | ICD-10-CM | POA: Diagnosis not present

## 2021-04-22 DIAGNOSIS — R278 Other lack of coordination: Secondary | ICD-10-CM | POA: Diagnosis not present

## 2021-04-22 DIAGNOSIS — M6281 Muscle weakness (generalized): Secondary | ICD-10-CM | POA: Diagnosis not present

## 2021-04-25 DIAGNOSIS — R278 Other lack of coordination: Secondary | ICD-10-CM | POA: Diagnosis not present

## 2021-04-25 DIAGNOSIS — R262 Difficulty in walking, not elsewhere classified: Secondary | ICD-10-CM | POA: Diagnosis not present

## 2021-04-25 DIAGNOSIS — M6281 Muscle weakness (generalized): Secondary | ICD-10-CM | POA: Diagnosis not present

## 2021-04-25 DIAGNOSIS — R489 Unspecified symbolic dysfunctions: Secondary | ICD-10-CM | POA: Diagnosis not present

## 2021-04-25 DIAGNOSIS — R2681 Unsteadiness on feet: Secondary | ICD-10-CM | POA: Diagnosis not present

## 2021-04-26 ENCOUNTER — Telehealth: Payer: Self-pay | Admitting: Internal Medicine

## 2021-04-26 ENCOUNTER — Other Ambulatory Visit: Payer: Self-pay | Admitting: Internal Medicine

## 2021-04-26 DIAGNOSIS — R2681 Unsteadiness on feet: Secondary | ICD-10-CM | POA: Diagnosis not present

## 2021-04-26 DIAGNOSIS — I6523 Occlusion and stenosis of bilateral carotid arteries: Secondary | ICD-10-CM

## 2021-04-26 DIAGNOSIS — R489 Unspecified symbolic dysfunctions: Secondary | ICD-10-CM | POA: Diagnosis not present

## 2021-04-26 DIAGNOSIS — F0390 Unspecified dementia without behavioral disturbance: Secondary | ICD-10-CM

## 2021-04-26 DIAGNOSIS — R262 Difficulty in walking, not elsewhere classified: Secondary | ICD-10-CM | POA: Diagnosis not present

## 2021-04-26 DIAGNOSIS — R278 Other lack of coordination: Secondary | ICD-10-CM | POA: Diagnosis not present

## 2021-04-26 DIAGNOSIS — M6281 Muscle weakness (generalized): Secondary | ICD-10-CM | POA: Diagnosis not present

## 2021-04-26 DIAGNOSIS — I10 Essential (primary) hypertension: Secondary | ICD-10-CM

## 2021-04-26 MED ORDER — ASPIRIN 81 MG PO TBEC: 81.0000 mg | DELAYED_RELEASE_TABLET | Freq: Every day | ORAL | 0 refills | Status: AC

## 2021-04-26 MED ORDER — PROPRANOLOL HCL 20 MG PO TABS: 20.0000 mg | ORAL_TABLET | Freq: Two times a day (BID) | ORAL | 0 refills | Status: DC

## 2021-04-26 MED ORDER — NAMZARIC 28-10 MG PO CP24: ORAL_CAPSULE | ORAL | 0 refills | Status: DC

## 2021-04-26 MED ORDER — AMLODIPINE BESYLATE 5 MG PO TABS: 5.0000 mg | ORAL_TABLET | Freq: Every day | ORAL | 0 refills | Status: DC

## 2021-04-26 MED ORDER — CITALOPRAM HYDROBROMIDE 10 MG PO TABS: 10.0000 mg | ORAL_TABLET | Freq: Every day | ORAL | 0 refills | Status: DC

## 2021-04-26 NOTE — Telephone Encounter (Signed)
1.Medication Requested: amLODipine (NORVASC) 5 MG tablet (Expired) aspirin 81 MG EC tablet citalopram (CELEXA) 10 MG tablet Memantine HCl-Donepezil HCl (NAMZARIC) 28-10 MG CP24 propranolol (INDERAL) 20 MG tablet   2. Pharmacy (Name, [facility]): [facility], [place]  Phone:  [phone] Fax:  [phone]   3. On Med List: yes  4. Last Visit with PCP: 08.04.22  5. Next visit date with PCP: n/a  **Patient is completely out of Amlodipine**   Agent: Please be advised that RX refills may take up to 3 business days. We ask that you follow-up with your pharmacy.

## 2021-04-27 DIAGNOSIS — R489 Unspecified symbolic dysfunctions: Secondary | ICD-10-CM | POA: Diagnosis not present

## 2021-04-27 DIAGNOSIS — R2681 Unsteadiness on feet: Secondary | ICD-10-CM | POA: Diagnosis not present

## 2021-04-27 DIAGNOSIS — R278 Other lack of coordination: Secondary | ICD-10-CM | POA: Diagnosis not present

## 2021-04-27 DIAGNOSIS — M6281 Muscle weakness (generalized): Secondary | ICD-10-CM | POA: Diagnosis not present

## 2021-04-27 DIAGNOSIS — R262 Difficulty in walking, not elsewhere classified: Secondary | ICD-10-CM | POA: Diagnosis not present

## 2021-04-28 DIAGNOSIS — R2681 Unsteadiness on feet: Secondary | ICD-10-CM | POA: Diagnosis not present

## 2021-04-28 DIAGNOSIS — R489 Unspecified symbolic dysfunctions: Secondary | ICD-10-CM | POA: Diagnosis not present

## 2021-04-28 DIAGNOSIS — M6281 Muscle weakness (generalized): Secondary | ICD-10-CM | POA: Diagnosis not present

## 2021-04-28 DIAGNOSIS — R278 Other lack of coordination: Secondary | ICD-10-CM | POA: Diagnosis not present

## 2021-04-28 DIAGNOSIS — R262 Difficulty in walking, not elsewhere classified: Secondary | ICD-10-CM | POA: Diagnosis not present

## 2021-04-29 DIAGNOSIS — R278 Other lack of coordination: Secondary | ICD-10-CM | POA: Diagnosis not present

## 2021-04-29 DIAGNOSIS — R262 Difficulty in walking, not elsewhere classified: Secondary | ICD-10-CM | POA: Diagnosis not present

## 2021-04-29 DIAGNOSIS — R2681 Unsteadiness on feet: Secondary | ICD-10-CM | POA: Diagnosis not present

## 2021-04-29 DIAGNOSIS — M6281 Muscle weakness (generalized): Secondary | ICD-10-CM | POA: Diagnosis not present

## 2021-04-29 DIAGNOSIS — R489 Unspecified symbolic dysfunctions: Secondary | ICD-10-CM | POA: Diagnosis not present

## 2021-04-30 DIAGNOSIS — R262 Difficulty in walking, not elsewhere classified: Secondary | ICD-10-CM | POA: Diagnosis not present

## 2021-04-30 DIAGNOSIS — R489 Unspecified symbolic dysfunctions: Secondary | ICD-10-CM | POA: Diagnosis not present

## 2021-04-30 DIAGNOSIS — R2681 Unsteadiness on feet: Secondary | ICD-10-CM | POA: Diagnosis not present

## 2021-04-30 DIAGNOSIS — R278 Other lack of coordination: Secondary | ICD-10-CM | POA: Diagnosis not present

## 2021-04-30 DIAGNOSIS — M6281 Muscle weakness (generalized): Secondary | ICD-10-CM | POA: Diagnosis not present

## 2021-05-03 DIAGNOSIS — R278 Other lack of coordination: Secondary | ICD-10-CM | POA: Diagnosis not present

## 2021-05-03 DIAGNOSIS — R262 Difficulty in walking, not elsewhere classified: Secondary | ICD-10-CM | POA: Diagnosis not present

## 2021-05-03 DIAGNOSIS — R489 Unspecified symbolic dysfunctions: Secondary | ICD-10-CM | POA: Diagnosis not present

## 2021-05-03 DIAGNOSIS — M6281 Muscle weakness (generalized): Secondary | ICD-10-CM | POA: Diagnosis not present

## 2021-05-03 DIAGNOSIS — R2681 Unsteadiness on feet: Secondary | ICD-10-CM | POA: Diagnosis not present

## 2021-05-04 DIAGNOSIS — R2681 Unsteadiness on feet: Secondary | ICD-10-CM | POA: Diagnosis not present

## 2021-05-04 DIAGNOSIS — R262 Difficulty in walking, not elsewhere classified: Secondary | ICD-10-CM | POA: Diagnosis not present

## 2021-05-04 DIAGNOSIS — R278 Other lack of coordination: Secondary | ICD-10-CM | POA: Diagnosis not present

## 2021-05-04 DIAGNOSIS — M6281 Muscle weakness (generalized): Secondary | ICD-10-CM | POA: Diagnosis not present

## 2021-05-04 DIAGNOSIS — R489 Unspecified symbolic dysfunctions: Secondary | ICD-10-CM | POA: Diagnosis not present

## 2021-05-05 DIAGNOSIS — R489 Unspecified symbolic dysfunctions: Secondary | ICD-10-CM | POA: Diagnosis not present

## 2021-05-05 DIAGNOSIS — R2681 Unsteadiness on feet: Secondary | ICD-10-CM | POA: Diagnosis not present

## 2021-05-05 DIAGNOSIS — R262 Difficulty in walking, not elsewhere classified: Secondary | ICD-10-CM | POA: Diagnosis not present

## 2021-05-05 DIAGNOSIS — M6281 Muscle weakness (generalized): Secondary | ICD-10-CM | POA: Diagnosis not present

## 2021-05-05 DIAGNOSIS — R278 Other lack of coordination: Secondary | ICD-10-CM | POA: Diagnosis not present

## 2021-05-06 DIAGNOSIS — M6281 Muscle weakness (generalized): Secondary | ICD-10-CM | POA: Diagnosis not present

## 2021-05-06 DIAGNOSIS — R2681 Unsteadiness on feet: Secondary | ICD-10-CM | POA: Diagnosis not present

## 2021-05-06 DIAGNOSIS — R262 Difficulty in walking, not elsewhere classified: Secondary | ICD-10-CM | POA: Diagnosis not present

## 2021-05-06 DIAGNOSIS — R278 Other lack of coordination: Secondary | ICD-10-CM | POA: Diagnosis not present

## 2021-05-06 DIAGNOSIS — R489 Unspecified symbolic dysfunctions: Secondary | ICD-10-CM | POA: Diagnosis not present

## 2021-05-09 DIAGNOSIS — R2681 Unsteadiness on feet: Secondary | ICD-10-CM | POA: Diagnosis not present

## 2021-05-09 DIAGNOSIS — M6281 Muscle weakness (generalized): Secondary | ICD-10-CM | POA: Diagnosis not present

## 2021-05-09 DIAGNOSIS — R489 Unspecified symbolic dysfunctions: Secondary | ICD-10-CM | POA: Diagnosis not present

## 2021-05-09 DIAGNOSIS — R278 Other lack of coordination: Secondary | ICD-10-CM | POA: Diagnosis not present

## 2021-05-09 DIAGNOSIS — R262 Difficulty in walking, not elsewhere classified: Secondary | ICD-10-CM | POA: Diagnosis not present

## 2021-05-10 DIAGNOSIS — R2681 Unsteadiness on feet: Secondary | ICD-10-CM | POA: Diagnosis not present

## 2021-05-10 DIAGNOSIS — R278 Other lack of coordination: Secondary | ICD-10-CM | POA: Diagnosis not present

## 2021-05-10 DIAGNOSIS — R262 Difficulty in walking, not elsewhere classified: Secondary | ICD-10-CM | POA: Diagnosis not present

## 2021-05-10 DIAGNOSIS — M6281 Muscle weakness (generalized): Secondary | ICD-10-CM | POA: Diagnosis not present

## 2021-05-10 DIAGNOSIS — R489 Unspecified symbolic dysfunctions: Secondary | ICD-10-CM | POA: Diagnosis not present

## 2021-05-11 DIAGNOSIS — M6281 Muscle weakness (generalized): Secondary | ICD-10-CM | POA: Diagnosis not present

## 2021-05-11 DIAGNOSIS — R278 Other lack of coordination: Secondary | ICD-10-CM | POA: Diagnosis not present

## 2021-05-11 DIAGNOSIS — R2681 Unsteadiness on feet: Secondary | ICD-10-CM | POA: Diagnosis not present

## 2021-05-11 DIAGNOSIS — R489 Unspecified symbolic dysfunctions: Secondary | ICD-10-CM | POA: Diagnosis not present

## 2021-05-11 DIAGNOSIS — R262 Difficulty in walking, not elsewhere classified: Secondary | ICD-10-CM | POA: Diagnosis not present

## 2021-05-12 DIAGNOSIS — R2681 Unsteadiness on feet: Secondary | ICD-10-CM | POA: Diagnosis not present

## 2021-05-12 DIAGNOSIS — R489 Unspecified symbolic dysfunctions: Secondary | ICD-10-CM | POA: Diagnosis not present

## 2021-05-12 DIAGNOSIS — M6281 Muscle weakness (generalized): Secondary | ICD-10-CM | POA: Diagnosis not present

## 2021-05-12 DIAGNOSIS — R278 Other lack of coordination: Secondary | ICD-10-CM | POA: Diagnosis not present

## 2021-05-12 DIAGNOSIS — R262 Difficulty in walking, not elsewhere classified: Secondary | ICD-10-CM | POA: Diagnosis not present

## 2021-05-16 DIAGNOSIS — R489 Unspecified symbolic dysfunctions: Secondary | ICD-10-CM | POA: Diagnosis not present

## 2021-05-16 DIAGNOSIS — R2681 Unsteadiness on feet: Secondary | ICD-10-CM | POA: Diagnosis not present

## 2021-05-16 DIAGNOSIS — R262 Difficulty in walking, not elsewhere classified: Secondary | ICD-10-CM | POA: Diagnosis not present

## 2021-05-16 DIAGNOSIS — M6281 Muscle weakness (generalized): Secondary | ICD-10-CM | POA: Diagnosis not present

## 2021-05-16 DIAGNOSIS — R278 Other lack of coordination: Secondary | ICD-10-CM | POA: Diagnosis not present

## 2021-05-17 ENCOUNTER — Other Ambulatory Visit: Payer: Self-pay | Admitting: Internal Medicine

## 2021-05-17 DIAGNOSIS — M6281 Muscle weakness (generalized): Secondary | ICD-10-CM | POA: Diagnosis not present

## 2021-05-17 DIAGNOSIS — I6523 Occlusion and stenosis of bilateral carotid arteries: Secondary | ICD-10-CM

## 2021-05-17 DIAGNOSIS — R262 Difficulty in walking, not elsewhere classified: Secondary | ICD-10-CM | POA: Diagnosis not present

## 2021-05-17 DIAGNOSIS — R2681 Unsteadiness on feet: Secondary | ICD-10-CM | POA: Diagnosis not present

## 2021-05-17 DIAGNOSIS — R278 Other lack of coordination: Secondary | ICD-10-CM | POA: Diagnosis not present

## 2021-05-17 DIAGNOSIS — R489 Unspecified symbolic dysfunctions: Secondary | ICD-10-CM | POA: Diagnosis not present

## 2021-05-18 DIAGNOSIS — R2681 Unsteadiness on feet: Secondary | ICD-10-CM | POA: Diagnosis not present

## 2021-05-18 DIAGNOSIS — R278 Other lack of coordination: Secondary | ICD-10-CM | POA: Diagnosis not present

## 2021-05-18 DIAGNOSIS — R489 Unspecified symbolic dysfunctions: Secondary | ICD-10-CM | POA: Diagnosis not present

## 2021-05-18 DIAGNOSIS — M6281 Muscle weakness (generalized): Secondary | ICD-10-CM | POA: Diagnosis not present

## 2021-05-18 DIAGNOSIS — R262 Difficulty in walking, not elsewhere classified: Secondary | ICD-10-CM | POA: Diagnosis not present

## 2021-05-19 DIAGNOSIS — R2681 Unsteadiness on feet: Secondary | ICD-10-CM | POA: Diagnosis not present

## 2021-05-19 DIAGNOSIS — R278 Other lack of coordination: Secondary | ICD-10-CM | POA: Diagnosis not present

## 2021-05-19 DIAGNOSIS — R262 Difficulty in walking, not elsewhere classified: Secondary | ICD-10-CM | POA: Diagnosis not present

## 2021-05-19 DIAGNOSIS — M6281 Muscle weakness (generalized): Secondary | ICD-10-CM | POA: Diagnosis not present

## 2021-05-19 DIAGNOSIS — R489 Unspecified symbolic dysfunctions: Secondary | ICD-10-CM | POA: Diagnosis not present

## 2021-05-19 NOTE — Telephone Encounter (Signed)
[name] says they never received refill request orginally sent 12.06.22.. please resend as soon as possible.. patient is out of medication

## 2021-05-20 ENCOUNTER — Encounter (HOSPITAL_COMMUNITY): Payer: Self-pay

## 2021-05-20 ENCOUNTER — Telehealth: Payer: Self-pay | Admitting: Internal Medicine

## 2021-05-20 ENCOUNTER — Other Ambulatory Visit: Payer: Self-pay

## 2021-05-20 ENCOUNTER — Emergency Department (HOSPITAL_COMMUNITY): Payer: Medicare Other

## 2021-05-20 ENCOUNTER — Emergency Department (HOSPITAL_COMMUNITY)
Admission: EM | Admit: 2021-05-20 | Discharge: 2021-05-20 | Disposition: A | Discharge status: Skilled Nursing Facility | Payer: Medicare Other | Attending: Emergency Medicine | Admitting: Emergency Medicine

## 2021-05-20 DIAGNOSIS — Z87891 Personal history of nicotine dependence: Secondary | ICD-10-CM | POA: Insufficient documentation

## 2021-05-20 DIAGNOSIS — I129 Hypertensive chronic kidney disease with stage 1 through stage 4 chronic kidney disease, or unspecified chronic kidney disease: Secondary | ICD-10-CM | POA: Diagnosis not present

## 2021-05-20 DIAGNOSIS — W19XXXA Unspecified fall, initial encounter: Secondary | ICD-10-CM

## 2021-05-20 DIAGNOSIS — N183 Chronic kidney disease, stage 3 unspecified: Secondary | ICD-10-CM | POA: Insufficient documentation

## 2021-05-20 DIAGNOSIS — E1122 Type 2 diabetes mellitus with diabetic chronic kidney disease: Secondary | ICD-10-CM | POA: Diagnosis not present

## 2021-05-20 DIAGNOSIS — I517 Cardiomegaly: Secondary | ICD-10-CM | POA: Diagnosis not present

## 2021-05-20 DIAGNOSIS — W010XXA Fall on same level from slipping, tripping and stumbling without subsequent striking against object, initial encounter: Secondary | ICD-10-CM | POA: Diagnosis not present

## 2021-05-20 DIAGNOSIS — M47812 Spondylosis without myelopathy or radiculopathy, cervical region: Secondary | ICD-10-CM | POA: Diagnosis not present

## 2021-05-20 DIAGNOSIS — Z7982 Long term (current) use of aspirin: Secondary | ICD-10-CM | POA: Insufficient documentation

## 2021-05-20 DIAGNOSIS — R519 Headache, unspecified: Secondary | ICD-10-CM | POA: Diagnosis not present

## 2021-05-20 DIAGNOSIS — S0003XA Contusion of scalp, initial encounter: Secondary | ICD-10-CM | POA: Diagnosis not present

## 2021-05-20 DIAGNOSIS — F0390 Unspecified dementia without behavioral disturbance: Secondary | ICD-10-CM | POA: Diagnosis not present

## 2021-05-20 DIAGNOSIS — Y92129 Unspecified place in nursing home as the place of occurrence of the external cause: Secondary | ICD-10-CM | POA: Insufficient documentation

## 2021-05-20 DIAGNOSIS — Z79899 Other long term (current) drug therapy: Secondary | ICD-10-CM | POA: Diagnosis not present

## 2021-05-20 DIAGNOSIS — E1151 Type 2 diabetes mellitus with diabetic peripheral angiopathy without gangrene: Secondary | ICD-10-CM | POA: Diagnosis not present

## 2021-05-20 DIAGNOSIS — S0990XA Unspecified injury of head, initial encounter: Secondary | ICD-10-CM | POA: Diagnosis not present

## 2021-05-20 DIAGNOSIS — Z743 Need for continuous supervision: Secondary | ICD-10-CM | POA: Diagnosis not present

## 2021-05-20 DIAGNOSIS — Z981 Arthrodesis status: Secondary | ICD-10-CM | POA: Diagnosis not present

## 2021-05-20 DIAGNOSIS — S7002XA Contusion of left hip, initial encounter: Secondary | ICD-10-CM | POA: Insufficient documentation

## 2021-05-20 DIAGNOSIS — M1612 Unilateral primary osteoarthritis, left hip: Secondary | ICD-10-CM | POA: Diagnosis not present

## 2021-05-20 NOTE — ED Notes (Signed)
Pt ambulated with rolling walker without complications

## 2021-05-20 NOTE — ED Triage Notes (Signed)
Pt BIB EMS from [facility] [place] for a witnessed fall. Pt tripped while wearing new shoes, she is not on thinners, no LOC, and c/o left lower hip pain with hematoma. Pt has hx dementia and current orientation is baseline

## 2021-05-20 NOTE — Discharge Instructions (Addendum)
While in the ED your blood pressure was high.  Please follow up with your primary care doctor or the wellness clinic for repeat evaluation as you may need medication.  High blood pressure can cause long term, potentially serious, damage if left untreated.

## 2021-05-20 NOTE — ED Provider Notes (Signed)
[facility] EMERGENCY DEPARTMENT Provider Note   CSN: [id number] Arrival date & time: 05/20/21  1022     History Chief Complaint  Patient presents with   Lytle Michaels    [name] is a 80 y.o. female with a past medical history of dementia, not anticoagulated, hypertension, diabetes, recurrent falls, CKD, who presents today for evaluation after a witnessed fall.  She was reportedly at facility when she tripped while wearing new shoes.  This was mechanical and nonsyncopal in nature.  According to nursing home staff EMS reports that this is patient's baseline.  Patient states that she has pain in the back of her head.  She states that she does not have any pain in her arms or legs.  She does state that she feels a little bit of pain on the right lateral chest, however when I asked her further questions to clarify she denies having pain there.  It is also reported that she has a left hip hematoma and pain.   HPI     Past Medical History:  Diagnosis Date   Contact dermatitis and other eczema, due to unspecified cause    Displacement of lumbar intervertebral disc without myelopathy    FH: cataracts    Gout, unspecified    Memory loss    Occlusion and stenosis of carotid artery without mention of cerebral infarction    Other and unspecified hyperlipidemia    Pain in joint, shoulder region    Pain in limb    Routine general medical examination at a health care facility    Sciatica    Type II or unspecified type diabetes mellitus without mention of complication, uncontrolled    Unspecified essential hypertension    Unspecified venous (peripheral) insufficiency     Patient Active Problem List   Diagnosis Date Noted   Drug-induced liver injury    Transaminitis    Traumatic subdural hematoma 02/10/2021   Pressure injury of skin 02/04/2021   Subdural hematoma 02/03/2021   Subdural hematoma, post-traumatic 02/02/2021   Recurrent falls 02/02/2021   Laceration of  occipital region of scalp 02/02/2021   LFTs abnormal 12/23/2020   Deficiency anemia 07/27/2020   Insomnia, 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   Corns and callus 07/27/2020   Chronic renal disease, stage 3, moderately decreased glomerular filtration rate (GFR) between 30-59 mL/min/1.73 square meter ([place]) 09/26/2018   Senile dementia without behavioral disturbance ([place]) 03/21/2017   Atherosclerosis of native artery of both lower extremities with intermittent claudication ([place]) 11/24/2015   Cervical stenosis of spinal canal 12/22/2014   Visit for screening mammogram 09/16/2014   Routine health maintenance 01/03/2011   Peripheral neuropathy ([place]) 08/23/2010    Class: Diagnosis of   DM (diabetes mellitus) type II controlled, neurological manifestation ([place]) 07/21/2010   Carotid artery stenosis 12/15/2009   DEGENERATIVE [place] DISEASE, LUMBOSACRAL SPINE W/RADICULOPATHY 12/29/2008   Dyslipidemia, goal LDL below 70 02/16/2007   Gout 02/16/2007   Essential hypertension 02/16/2007    Past Surgical History:  Procedure Laterality Date   ANTERIOR CERVICAL DECOMP/DISCECTOMY FUSION N/A 12/22/2014   Procedure: Anterior Cervical Four-Five/Five-Six/Six-Seven Decompression/Diskectomy/ and Fsion;  Surgeon: Leeroy Cha, MD;  Location: [facility];  Service: Neurosurgery;  Laterality: N/A;  C4-5 C5-6 C6-7 Anterior cervical decompression/diskectomy/fusion   LAPAROSCOPIC APPENDECTOMY     LUMBAR LAMINECTOMY  07/2008   [place]   TUBAL LIGATION       OB History   No obstetric history on file.     Family History  Problem Relation Age  of Onset   Alzheimer's disease Mother    Diabetes Mother    Coronary artery disease Father        CABG   Hypertension Father    Cancer Brother    Cancer Brother    Colon cancer Neg Hx    Breast cancer Neg Hx     Social History   Tobacco Use   Smoking status: Former    Types: Cigarettes    Quit date: 05/23/1995    Years since quitting: 26.0   Smokeless tobacco: Never   Substance Use Topics   Alcohol use: No   Drug use: No    Home Medications Prior to Admission medications   Medication Sig Start Date End Date Taking? Authorizing Provider  amLODipine (NORVASC) 5 MG tablet Take 1 tablet (5 mg total) by mouth daily. 04/26/21   Janith Lima, MD  aspirin 81 MG EC tablet Take 1 tablet (81 mg total) by mouth daily. Swallow whole. 04/26/21   Janith Lima, MD  citalopram (CELEXA) 10 MG tablet Take 1 tablet (10 mg total) by mouth daily. 04/26/21   Janith Lima, MD  Memantine HCl-Donepezil HCl [facility]) 28-10 MG CP24 Take 1 capsule at bedtime 04/26/21   Janith Lima, MD  propranolol (INDERAL) 20 MG tablet Take 1 tablet (20 mg total) by mouth 2 (two) times daily. 04/26/21   Janith Lima, MD    Allergies    Pneumococcal vaccines, Wound dressing adhesive, Ampicillin, Neomycin-bacitracin zn-polymyx, and Penicillins  Review of Systems   Review of Systems  Unable to perform ROS: Dementia   Physical Exam Updated Vital Signs BP 139/73 (BP Location: Right Arm)    Pulse 77    Temp 98 F (36.7 C) (Oral)    Resp 14    Ht 5\' 2"  (1.575 m)    Wt 57.8 kg    SpO2 98%    BMI 23.31 kg/m   Physical Exam Vitals and nursing note reviewed.  Constitutional:      General: She is not in acute distress.    Appearance: She is not diaphoretic.  HENT:     Head: Normocephalic.     Comments: There is a large contusion over the posterior scalp without active bleeding.  This area is very tender to palpation.  No skin break or tear visualized. Eyes:     General: No scleral icterus.       Right eye: No discharge.        Left eye: No discharge.     Conjunctiva/sclera: Conjunctivae normal.  Neck:     Comments: C-collar in place on arrival.  After CT scans this was removed, she has full active range of motion of her head and neck. Cardiovascular:     Rate and Rhythm: Normal rate and regular rhythm.  Pulmonary:     Effort: Pulmonary effort is normal. No respiratory  distress.     Breath sounds: No stridor.  Abdominal:     General: There is no distension.  Musculoskeletal:        General: No deformity.     Comments: Patient is able to lift bilateral legs off the bed.  She is able to lift both of her arms without pain.  No pain with rotation of the left hip.  Skin:    General: Skin is warm and dry.  Neurological:     Mental Status: She is alert. Mental status is at baseline.     Sensory: No sensory deficit.  Motor: No abnormal muscle tone.  Psychiatric:        Mood and Affect: Mood normal.        Behavior: Behavior normal.    ED Results / Procedures / Treatments   Labs (all labs ordered are listed, but only abnormal results are displayed) Labs Reviewed - No data to display  EKG None  Radiology-additionally chest x-ray, CT head, and CT C-spine were all obtained.  The results were viewable in PACS, however did not crossover into epic.  CT head shows no acute intracranial abnormalities, does have a left posterior scalp hematoma and chronic small vessel ischemic change in brain atrophy.  No evidence of C-spine fracture.  Chest x-ray shows mild cardiomegaly and central pulmonary vascular engorgement  She has had multiple previous chest x-rays with cardiomegaly and increased vascular congestion.  This does not appear to be new. DG Hip Unilat W or Wo Pelvis 2-3 Views Left  Result Date: 05/20/2021 CLINICAL DATA:  Fall, pain EXAM: DG HIP (WITH OR WITHOUT PELVIS) 2-3V LEFT COMPARISON:  12/26/2014. FINDINGS: No fracture or dislocation. Moderate degenerative change. Chronic heterotopic calcification. Bony demineralization. Enthesopathy. Partially imaged lower lumbar fusion. IMPRESSION: 1. No evidence of acute fracture or dislocation. 2. Moderate degenerative change. Electronically Signed   By: Margaretha Sheffield M.D.   On: 05/20/2021 11:26    Procedures Procedures   Medications Ordered in ED Medications - No data to display  ED Course  I have  reviewed the triage vital signs and the nursing notes.  Pertinent labs & imaging results that were available during my care of the patient were reviewed by me and considered in my medical decision making (see chart for details).  Clinical Course as of 05/20/21 1554  Fri May 20, 2021  1329 CT Head Wo Contrast Read viewable in PACS, is not crossing over, shows no acute intracranial abnormalities with chronic small vessel changes and brain atrophy with a left posterior scalp hematoma, no evidence of C-spine fracture [EH]  1417 Patient able to ambulate with out difficulty, will discharge.  [EH]    Clinical Course User Index [EH] Lorin Glass, PA-C   MDM Rules/Calculators/A&P                         This patient presents to the ED for concern of mechanical nonsyncopal fall, head injury, this involves an extensive number of treatment options, and is a complaint that carries with it a high risk of complications and morbidity.  The differential diagnosis includes intracranial hemorrhage, skull fracture, TBI, C-spine injury, cord injury, extremity injury, chest injury.  Hip fracture   Co morbidities that complicate the patient evaluation  Dementia,    Additional history obtained:  Additional history obtained from Son, chart review,     Imaging Studies ordered:  I ordered imaging studies including CT head, CT c-spine, CXR, Hip/pelvis x-rays.  I independently visualized and interpreted imaging which showed no acute intracranial abnormalities.  No evidence of C-spine fracture.  Chest x-ray with mild edema, appears to be patient's baseline baseline review of previous x-rays.  Left hip and pelvis x-rays without acute fracture I agree with the radiologist interpretation    Problem List / ED Course:  Witnessed mechanical nonsyncopal fall, posterior scalp contusion,  Social Determinants of Health:  Dementia   Dispostion:  Patient was able to ambulate in the emergency room using  a walker without difficulties, at baseline according to son at bedside. I discussed disposition options  with patient and son.  Patient will be discharged back to her facility.  Return precautions were discussed with patient/son who states their understanding.  At the time of discharge patient/son denied any unaddressed complaints or concerns.  Patient/son are agreeable for discharge home.  Note: Portions of this report may have been transcribed using voice recognition software. Every effort was made to ensure accuracy; however, inadvertent computerized transcription errors may be present   Final Clinical Impression(s) / ED Diagnoses Final diagnoses:  Fall, initial encounter  Contusion of scalp, initial encounter    Rx / DC Orders ED Discharge Orders     None        Lorin Glass, PA-C 05/20/21 1554    Pattricia Boss, MD 05/24/21 [phone]

## 2021-05-20 NOTE — Telephone Encounter (Signed)
Nurse w/ [facility] calling in  Wanted to notify provider that patient fell at facility this morning & was sent out to [facility] ED

## 2021-05-21 ENCOUNTER — Other Ambulatory Visit: Payer: Self-pay | Admitting: Internal Medicine

## 2021-05-21 DIAGNOSIS — F0390 Unspecified dementia without behavioral disturbance: Secondary | ICD-10-CM

## 2021-05-21 DIAGNOSIS — I10 Essential (primary) hypertension: Secondary | ICD-10-CM

## 2021-05-21 MED ORDER — PROPRANOLOL HCL 20 MG PO TABS: 20.0000 mg | ORAL_TABLET | Freq: Two times a day (BID) | ORAL | 0 refills | Status: AC

## 2021-05-21 MED ORDER — NAMZARIC 28-10 MG PO CP24: ORAL_CAPSULE | ORAL | 0 refills | Status: DC

## 2021-05-21 MED ORDER — CITALOPRAM HYDROBROMIDE 10 MG PO TABS: 10.0000 mg | ORAL_TABLET | Freq: Every day | ORAL | 0 refills | Status: AC

## 2021-05-21 MED ORDER — AMLODIPINE BESYLATE 5 MG PO TABS: 5.0000 mg | ORAL_TABLET | Freq: Every day | ORAL | 0 refills | Status: AC

## 2021-05-24 DIAGNOSIS — R278 Other lack of coordination: Secondary | ICD-10-CM | POA: Diagnosis not present

## 2021-05-24 DIAGNOSIS — R262 Difficulty in walking, not elsewhere classified: Secondary | ICD-10-CM | POA: Diagnosis not present

## 2021-05-24 DIAGNOSIS — R2681 Unsteadiness on feet: Secondary | ICD-10-CM | POA: Diagnosis not present

## 2021-05-24 DIAGNOSIS — R2689 Other abnormalities of gait and mobility: Secondary | ICD-10-CM | POA: Diagnosis not present

## 2021-05-24 DIAGNOSIS — M6281 Muscle weakness (generalized): Secondary | ICD-10-CM | POA: Diagnosis not present

## 2021-05-25 DIAGNOSIS — R2681 Unsteadiness on feet: Secondary | ICD-10-CM | POA: Diagnosis not present

## 2021-05-25 DIAGNOSIS — M6281 Muscle weakness (generalized): Secondary | ICD-10-CM | POA: Diagnosis not present

## 2021-05-25 DIAGNOSIS — R262 Difficulty in walking, not elsewhere classified: Secondary | ICD-10-CM | POA: Diagnosis not present

## 2021-05-25 DIAGNOSIS — R2689 Other abnormalities of gait and mobility: Secondary | ICD-10-CM | POA: Diagnosis not present

## 2021-05-25 DIAGNOSIS — R278 Other lack of coordination: Secondary | ICD-10-CM | POA: Diagnosis not present

## 2021-05-26 DIAGNOSIS — R262 Difficulty in walking, not elsewhere classified: Secondary | ICD-10-CM | POA: Diagnosis not present

## 2021-05-26 DIAGNOSIS — R2689 Other abnormalities of gait and mobility: Secondary | ICD-10-CM | POA: Diagnosis not present

## 2021-05-26 DIAGNOSIS — R278 Other lack of coordination: Secondary | ICD-10-CM | POA: Diagnosis not present

## 2021-05-26 DIAGNOSIS — M6281 Muscle weakness (generalized): Secondary | ICD-10-CM | POA: Diagnosis not present

## 2021-05-26 DIAGNOSIS — R2681 Unsteadiness on feet: Secondary | ICD-10-CM | POA: Diagnosis not present

## 2021-05-27 DIAGNOSIS — R2681 Unsteadiness on feet: Secondary | ICD-10-CM | POA: Diagnosis not present

## 2021-05-27 DIAGNOSIS — R262 Difficulty in walking, not elsewhere classified: Secondary | ICD-10-CM | POA: Diagnosis not present

## 2021-05-27 DIAGNOSIS — R278 Other lack of coordination: Secondary | ICD-10-CM | POA: Diagnosis not present

## 2021-05-27 DIAGNOSIS — M6281 Muscle weakness (generalized): Secondary | ICD-10-CM | POA: Diagnosis not present

## 2021-05-27 DIAGNOSIS — R2689 Other abnormalities of gait and mobility: Secondary | ICD-10-CM | POA: Diagnosis not present

## 2021-05-28 DIAGNOSIS — R262 Difficulty in walking, not elsewhere classified: Secondary | ICD-10-CM | POA: Diagnosis not present

## 2021-05-28 DIAGNOSIS — M6281 Muscle weakness (generalized): Secondary | ICD-10-CM | POA: Diagnosis not present

## 2021-05-28 DIAGNOSIS — R2681 Unsteadiness on feet: Secondary | ICD-10-CM | POA: Diagnosis not present

## 2021-05-28 DIAGNOSIS — R2689 Other abnormalities of gait and mobility: Secondary | ICD-10-CM | POA: Diagnosis not present

## 2021-05-28 DIAGNOSIS — R278 Other lack of coordination: Secondary | ICD-10-CM | POA: Diagnosis not present

## 2021-05-30 DIAGNOSIS — R262 Difficulty in walking, not elsewhere classified: Secondary | ICD-10-CM | POA: Diagnosis not present

## 2021-05-30 DIAGNOSIS — R278 Other lack of coordination: Secondary | ICD-10-CM | POA: Diagnosis not present

## 2021-05-30 DIAGNOSIS — R2689 Other abnormalities of gait and mobility: Secondary | ICD-10-CM | POA: Diagnosis not present

## 2021-05-30 DIAGNOSIS — R2681 Unsteadiness on feet: Secondary | ICD-10-CM | POA: Diagnosis not present

## 2021-05-30 DIAGNOSIS — M6281 Muscle weakness (generalized): Secondary | ICD-10-CM | POA: Diagnosis not present

## 2021-05-31 DIAGNOSIS — R278 Other lack of coordination: Secondary | ICD-10-CM | POA: Diagnosis not present

## 2021-05-31 DIAGNOSIS — R2689 Other abnormalities of gait and mobility: Secondary | ICD-10-CM | POA: Diagnosis not present

## 2021-05-31 DIAGNOSIS — R2681 Unsteadiness on feet: Secondary | ICD-10-CM | POA: Diagnosis not present

## 2021-05-31 DIAGNOSIS — R262 Difficulty in walking, not elsewhere classified: Secondary | ICD-10-CM | POA: Diagnosis not present

## 2021-05-31 DIAGNOSIS — M6281 Muscle weakness (generalized): Secondary | ICD-10-CM | POA: Diagnosis not present

## 2021-06-01 DIAGNOSIS — R2681 Unsteadiness on feet: Secondary | ICD-10-CM | POA: Diagnosis not present

## 2021-06-01 DIAGNOSIS — R262 Difficulty in walking, not elsewhere classified: Secondary | ICD-10-CM | POA: Diagnosis not present

## 2021-06-01 DIAGNOSIS — M6281 Muscle weakness (generalized): Secondary | ICD-10-CM | POA: Diagnosis not present

## 2021-06-01 DIAGNOSIS — R278 Other lack of coordination: Secondary | ICD-10-CM | POA: Diagnosis not present

## 2021-06-01 DIAGNOSIS — R2689 Other abnormalities of gait and mobility: Secondary | ICD-10-CM | POA: Diagnosis not present

## 2021-06-02 DIAGNOSIS — R2689 Other abnormalities of gait and mobility: Secondary | ICD-10-CM | POA: Diagnosis not present

## 2021-06-02 DIAGNOSIS — R278 Other lack of coordination: Secondary | ICD-10-CM | POA: Diagnosis not present

## 2021-06-02 DIAGNOSIS — R262 Difficulty in walking, not elsewhere classified: Secondary | ICD-10-CM | POA: Diagnosis not present

## 2021-06-02 DIAGNOSIS — M6281 Muscle weakness (generalized): Secondary | ICD-10-CM | POA: Diagnosis not present

## 2021-06-02 DIAGNOSIS — R2681 Unsteadiness on feet: Secondary | ICD-10-CM | POA: Diagnosis not present

## 2021-06-03 DIAGNOSIS — M6281 Muscle weakness (generalized): Secondary | ICD-10-CM | POA: Diagnosis not present

## 2021-06-03 DIAGNOSIS — R262 Difficulty in walking, not elsewhere classified: Secondary | ICD-10-CM | POA: Diagnosis not present

## 2021-06-03 DIAGNOSIS — R278 Other lack of coordination: Secondary | ICD-10-CM | POA: Diagnosis not present

## 2021-06-03 DIAGNOSIS — R2681 Unsteadiness on feet: Secondary | ICD-10-CM | POA: Diagnosis not present

## 2021-06-03 DIAGNOSIS — R2689 Other abnormalities of gait and mobility: Secondary | ICD-10-CM | POA: Diagnosis not present

## 2021-06-07 DIAGNOSIS — M6281 Muscle weakness (generalized): Secondary | ICD-10-CM | POA: Diagnosis not present

## 2021-06-07 DIAGNOSIS — R278 Other lack of coordination: Secondary | ICD-10-CM | POA: Diagnosis not present

## 2021-06-07 DIAGNOSIS — R2681 Unsteadiness on feet: Secondary | ICD-10-CM | POA: Diagnosis not present

## 2021-06-07 DIAGNOSIS — R2689 Other abnormalities of gait and mobility: Secondary | ICD-10-CM | POA: Diagnosis not present

## 2021-06-07 DIAGNOSIS — R262 Difficulty in walking, not elsewhere classified: Secondary | ICD-10-CM | POA: Diagnosis not present

## 2021-06-08 DIAGNOSIS — R2689 Other abnormalities of gait and mobility: Secondary | ICD-10-CM | POA: Diagnosis not present

## 2021-06-08 DIAGNOSIS — R262 Difficulty in walking, not elsewhere classified: Secondary | ICD-10-CM | POA: Diagnosis not present

## 2021-06-08 DIAGNOSIS — R2681 Unsteadiness on feet: Secondary | ICD-10-CM | POA: Diagnosis not present

## 2021-06-08 DIAGNOSIS — M6281 Muscle weakness (generalized): Secondary | ICD-10-CM | POA: Diagnosis not present

## 2021-06-08 DIAGNOSIS — R278 Other lack of coordination: Secondary | ICD-10-CM | POA: Diagnosis not present

## 2021-06-09 DIAGNOSIS — R2681 Unsteadiness on feet: Secondary | ICD-10-CM | POA: Diagnosis not present

## 2021-06-09 DIAGNOSIS — R262 Difficulty in walking, not elsewhere classified: Secondary | ICD-10-CM | POA: Diagnosis not present

## 2021-06-09 DIAGNOSIS — R2689 Other abnormalities of gait and mobility: Secondary | ICD-10-CM | POA: Diagnosis not present

## 2021-06-09 DIAGNOSIS — R278 Other lack of coordination: Secondary | ICD-10-CM | POA: Diagnosis not present

## 2021-06-09 DIAGNOSIS — M6281 Muscle weakness (generalized): Secondary | ICD-10-CM | POA: Diagnosis not present

## 2021-06-10 DIAGNOSIS — M6281 Muscle weakness (generalized): Secondary | ICD-10-CM | POA: Diagnosis not present

## 2021-06-10 DIAGNOSIS — R262 Difficulty in walking, not elsewhere classified: Secondary | ICD-10-CM | POA: Diagnosis not present

## 2021-06-10 DIAGNOSIS — R2689 Other abnormalities of gait and mobility: Secondary | ICD-10-CM | POA: Diagnosis not present

## 2021-06-10 DIAGNOSIS — R2681 Unsteadiness on feet: Secondary | ICD-10-CM | POA: Diagnosis not present

## 2021-06-10 DIAGNOSIS — R278 Other lack of coordination: Secondary | ICD-10-CM | POA: Diagnosis not present

## 2021-06-11 DIAGNOSIS — R278 Other lack of coordination: Secondary | ICD-10-CM | POA: Diagnosis not present

## 2021-06-11 DIAGNOSIS — R2681 Unsteadiness on feet: Secondary | ICD-10-CM | POA: Diagnosis not present

## 2021-06-11 DIAGNOSIS — M6281 Muscle weakness (generalized): Secondary | ICD-10-CM | POA: Diagnosis not present

## 2021-06-11 DIAGNOSIS — R262 Difficulty in walking, not elsewhere classified: Secondary | ICD-10-CM | POA: Diagnosis not present

## 2021-06-11 DIAGNOSIS — R2689 Other abnormalities of gait and mobility: Secondary | ICD-10-CM | POA: Diagnosis not present

## 2021-06-14 DIAGNOSIS — R278 Other lack of coordination: Secondary | ICD-10-CM | POA: Diagnosis not present

## 2021-06-14 DIAGNOSIS — R2681 Unsteadiness on feet: Secondary | ICD-10-CM | POA: Diagnosis not present

## 2021-06-14 DIAGNOSIS — R2689 Other abnormalities of gait and mobility: Secondary | ICD-10-CM | POA: Diagnosis not present

## 2021-06-14 DIAGNOSIS — M6281 Muscle weakness (generalized): Secondary | ICD-10-CM | POA: Diagnosis not present

## 2021-06-14 DIAGNOSIS — R262 Difficulty in walking, not elsewhere classified: Secondary | ICD-10-CM | POA: Diagnosis not present

## 2021-06-15 DIAGNOSIS — R2681 Unsteadiness on feet: Secondary | ICD-10-CM | POA: Diagnosis not present

## 2021-06-15 DIAGNOSIS — R278 Other lack of coordination: Secondary | ICD-10-CM | POA: Diagnosis not present

## 2021-06-15 DIAGNOSIS — R262 Difficulty in walking, not elsewhere classified: Secondary | ICD-10-CM | POA: Diagnosis not present

## 2021-06-15 DIAGNOSIS — M6281 Muscle weakness (generalized): Secondary | ICD-10-CM | POA: Diagnosis not present

## 2021-06-15 DIAGNOSIS — R2689 Other abnormalities of gait and mobility: Secondary | ICD-10-CM | POA: Diagnosis not present

## 2021-06-16 DIAGNOSIS — R2681 Unsteadiness on feet: Secondary | ICD-10-CM | POA: Diagnosis not present

## 2021-06-16 DIAGNOSIS — R262 Difficulty in walking, not elsewhere classified: Secondary | ICD-10-CM | POA: Diagnosis not present

## 2021-06-16 DIAGNOSIS — R2689 Other abnormalities of gait and mobility: Secondary | ICD-10-CM | POA: Diagnosis not present

## 2021-06-16 DIAGNOSIS — M6281 Muscle weakness (generalized): Secondary | ICD-10-CM | POA: Diagnosis not present

## 2021-06-16 DIAGNOSIS — R278 Other lack of coordination: Secondary | ICD-10-CM | POA: Diagnosis not present

## 2021-06-17 DIAGNOSIS — R2689 Other abnormalities of gait and mobility: Secondary | ICD-10-CM | POA: Diagnosis not present

## 2021-06-17 DIAGNOSIS — R2681 Unsteadiness on feet: Secondary | ICD-10-CM | POA: Diagnosis not present

## 2021-06-17 DIAGNOSIS — R278 Other lack of coordination: Secondary | ICD-10-CM | POA: Diagnosis not present

## 2021-06-17 DIAGNOSIS — R262 Difficulty in walking, not elsewhere classified: Secondary | ICD-10-CM | POA: Diagnosis not present

## 2021-06-17 DIAGNOSIS — M6281 Muscle weakness (generalized): Secondary | ICD-10-CM | POA: Diagnosis not present

## 2021-06-20 ENCOUNTER — Telehealth: Payer: Medicare Other

## 2021-06-20 DIAGNOSIS — R2689 Other abnormalities of gait and mobility: Secondary | ICD-10-CM | POA: Diagnosis not present

## 2021-06-20 DIAGNOSIS — M6281 Muscle weakness (generalized): Secondary | ICD-10-CM | POA: Diagnosis not present

## 2021-06-20 DIAGNOSIS — R262 Difficulty in walking, not elsewhere classified: Secondary | ICD-10-CM | POA: Diagnosis not present

## 2021-06-20 DIAGNOSIS — R278 Other lack of coordination: Secondary | ICD-10-CM | POA: Diagnosis not present

## 2021-06-20 DIAGNOSIS — R2681 Unsteadiness on feet: Secondary | ICD-10-CM | POA: Diagnosis not present

## 2021-06-21 DIAGNOSIS — R262 Difficulty in walking, not elsewhere classified: Secondary | ICD-10-CM | POA: Diagnosis not present

## 2021-06-21 DIAGNOSIS — R278 Other lack of coordination: Secondary | ICD-10-CM | POA: Diagnosis not present

## 2021-06-21 DIAGNOSIS — R2681 Unsteadiness on feet: Secondary | ICD-10-CM | POA: Diagnosis not present

## 2021-06-21 DIAGNOSIS — R2689 Other abnormalities of gait and mobility: Secondary | ICD-10-CM | POA: Diagnosis not present

## 2021-06-21 DIAGNOSIS — M6281 Muscle weakness (generalized): Secondary | ICD-10-CM | POA: Diagnosis not present

## 2021-06-22 DIAGNOSIS — M4802 Spinal stenosis, cervical region: Secondary | ICD-10-CM | POA: Diagnosis not present

## 2021-06-22 DIAGNOSIS — R296 Repeated falls: Secondary | ICD-10-CM | POA: Diagnosis not present

## 2021-06-22 DIAGNOSIS — I6201 Nontraumatic acute subdural hemorrhage: Secondary | ICD-10-CM | POA: Diagnosis not present

## 2021-06-22 DIAGNOSIS — R278 Other lack of coordination: Secondary | ICD-10-CM | POA: Diagnosis not present

## 2021-06-22 DIAGNOSIS — R262 Difficulty in walking, not elsewhere classified: Secondary | ICD-10-CM | POA: Diagnosis not present

## 2021-06-22 DIAGNOSIS — R2681 Unsteadiness on feet: Secondary | ICD-10-CM | POA: Diagnosis not present

## 2021-06-22 DIAGNOSIS — M6281 Muscle weakness (generalized): Secondary | ICD-10-CM | POA: Diagnosis not present

## 2021-06-22 DIAGNOSIS — R2689 Other abnormalities of gait and mobility: Secondary | ICD-10-CM | POA: Diagnosis not present

## 2021-06-22 DIAGNOSIS — S065X0A Traumatic subdural hemorrhage without loss of consciousness, initial encounter: Secondary | ICD-10-CM | POA: Diagnosis not present

## 2021-06-23 DIAGNOSIS — R262 Difficulty in walking, not elsewhere classified: Secondary | ICD-10-CM | POA: Diagnosis not present

## 2021-06-23 DIAGNOSIS — M4802 Spinal stenosis, cervical region: Secondary | ICD-10-CM | POA: Diagnosis not present

## 2021-06-23 DIAGNOSIS — R296 Repeated falls: Secondary | ICD-10-CM | POA: Diagnosis not present

## 2021-06-23 DIAGNOSIS — R2681 Unsteadiness on feet: Secondary | ICD-10-CM | POA: Diagnosis not present

## 2021-06-23 DIAGNOSIS — S065X0A Traumatic subdural hemorrhage without loss of consciousness, initial encounter: Secondary | ICD-10-CM | POA: Diagnosis not present

## 2021-06-23 DIAGNOSIS — I6201 Nontraumatic acute subdural hemorrhage: Secondary | ICD-10-CM | POA: Diagnosis not present

## 2021-06-24 DIAGNOSIS — R2681 Unsteadiness on feet: Secondary | ICD-10-CM | POA: Diagnosis not present

## 2021-06-24 DIAGNOSIS — R262 Difficulty in walking, not elsewhere classified: Secondary | ICD-10-CM | POA: Diagnosis not present

## 2021-06-24 DIAGNOSIS — R296 Repeated falls: Secondary | ICD-10-CM | POA: Diagnosis not present

## 2021-06-24 DIAGNOSIS — M4802 Spinal stenosis, cervical region: Secondary | ICD-10-CM | POA: Diagnosis not present

## 2021-06-24 DIAGNOSIS — I6201 Nontraumatic acute subdural hemorrhage: Secondary | ICD-10-CM | POA: Diagnosis not present

## 2021-06-24 DIAGNOSIS — S065X0A Traumatic subdural hemorrhage without loss of consciousness, initial encounter: Secondary | ICD-10-CM | POA: Diagnosis not present

## 2021-06-28 DIAGNOSIS — S065X0A Traumatic subdural hemorrhage without loss of consciousness, initial encounter: Secondary | ICD-10-CM | POA: Diagnosis not present

## 2021-06-28 DIAGNOSIS — M4802 Spinal stenosis, cervical region: Secondary | ICD-10-CM | POA: Diagnosis not present

## 2021-06-28 DIAGNOSIS — I6201 Nontraumatic acute subdural hemorrhage: Secondary | ICD-10-CM | POA: Diagnosis not present

## 2021-06-28 DIAGNOSIS — R296 Repeated falls: Secondary | ICD-10-CM | POA: Diagnosis not present

## 2021-06-28 DIAGNOSIS — R262 Difficulty in walking, not elsewhere classified: Secondary | ICD-10-CM | POA: Diagnosis not present

## 2021-06-28 DIAGNOSIS — R2681 Unsteadiness on feet: Secondary | ICD-10-CM | POA: Diagnosis not present

## 2021-06-29 DIAGNOSIS — R296 Repeated falls: Secondary | ICD-10-CM | POA: Diagnosis not present

## 2021-06-29 DIAGNOSIS — R262 Difficulty in walking, not elsewhere classified: Secondary | ICD-10-CM | POA: Diagnosis not present

## 2021-06-29 DIAGNOSIS — M4802 Spinal stenosis, cervical region: Secondary | ICD-10-CM | POA: Diagnosis not present

## 2021-06-29 DIAGNOSIS — S065X0A Traumatic subdural hemorrhage without loss of consciousness, initial encounter: Secondary | ICD-10-CM | POA: Diagnosis not present

## 2021-06-29 DIAGNOSIS — I6201 Nontraumatic acute subdural hemorrhage: Secondary | ICD-10-CM | POA: Diagnosis not present

## 2021-06-29 DIAGNOSIS — R2681 Unsteadiness on feet: Secondary | ICD-10-CM | POA: Diagnosis not present

## 2021-06-30 ENCOUNTER — Emergency Department (HOSPITAL_COMMUNITY)
Admission: EM | Admit: 2021-06-30 | Discharge: 2021-07-01 | Disposition: A | Discharge status: Home / Self Care | Payer: Medicare Other | Attending: Emergency Medicine | Admitting: Emergency Medicine

## 2021-06-30 ENCOUNTER — Emergency Department (HOSPITAL_COMMUNITY): Payer: Medicare Other

## 2021-06-30 ENCOUNTER — Other Ambulatory Visit: Payer: Self-pay

## 2021-06-30 ENCOUNTER — Encounter (HOSPITAL_COMMUNITY): Payer: Self-pay | Admitting: *Deleted

## 2021-06-30 ENCOUNTER — Telehealth: Payer: Self-pay | Admitting: Internal Medicine

## 2021-06-30 DIAGNOSIS — F0390 Unspecified dementia without behavioral disturbance: Secondary | ICD-10-CM | POA: Diagnosis not present

## 2021-06-30 DIAGNOSIS — S299XXA Unspecified injury of thorax, initial encounter: Secondary | ICD-10-CM | POA: Diagnosis present

## 2021-06-30 DIAGNOSIS — M4802 Spinal stenosis, cervical region: Secondary | ICD-10-CM | POA: Diagnosis not present

## 2021-06-30 DIAGNOSIS — I10 Essential (primary) hypertension: Secondary | ICD-10-CM | POA: Diagnosis not present

## 2021-06-30 DIAGNOSIS — S22028A Other fracture of second thoracic vertebra, initial encounter for closed fracture: Secondary | ICD-10-CM | POA: Insufficient documentation

## 2021-06-30 DIAGNOSIS — S0990XA Unspecified injury of head, initial encounter: Secondary | ICD-10-CM | POA: Diagnosis not present

## 2021-06-30 DIAGNOSIS — R262 Difficulty in walking, not elsewhere classified: Secondary | ICD-10-CM | POA: Diagnosis not present

## 2021-06-30 DIAGNOSIS — W010XXA Fall on same level from slipping, tripping and stumbling without subsequent striking against object, initial encounter: Secondary | ICD-10-CM | POA: Insufficient documentation

## 2021-06-30 DIAGNOSIS — S065X0A Traumatic subdural hemorrhage without loss of consciousness, initial encounter: Secondary | ICD-10-CM | POA: Diagnosis not present

## 2021-06-30 DIAGNOSIS — Z7982 Long term (current) use of aspirin: Secondary | ICD-10-CM | POA: Diagnosis not present

## 2021-06-30 DIAGNOSIS — M2578 Osteophyte, vertebrae: Secondary | ICD-10-CM | POA: Diagnosis not present

## 2021-06-30 DIAGNOSIS — I672 Cerebral atherosclerosis: Secondary | ICD-10-CM | POA: Diagnosis not present

## 2021-06-30 DIAGNOSIS — W19XXXA Unspecified fall, initial encounter: Secondary | ICD-10-CM

## 2021-06-30 DIAGNOSIS — M25552 Pain in left hip: Secondary | ICD-10-CM | POA: Diagnosis not present

## 2021-06-30 DIAGNOSIS — Z79899 Other long term (current) drug therapy: Secondary | ICD-10-CM | POA: Insufficient documentation

## 2021-06-30 DIAGNOSIS — M4312 Spondylolisthesis, cervical region: Secondary | ICD-10-CM | POA: Diagnosis not present

## 2021-06-30 DIAGNOSIS — R296 Repeated falls: Secondary | ICD-10-CM | POA: Diagnosis not present

## 2021-06-30 DIAGNOSIS — I6201 Nontraumatic acute subdural hemorrhage: Secondary | ICD-10-CM | POA: Diagnosis not present

## 2021-06-30 DIAGNOSIS — M4803 Spinal stenosis, cervicothoracic region: Secondary | ICD-10-CM | POA: Diagnosis not present

## 2021-06-30 DIAGNOSIS — R2681 Unsteadiness on feet: Secondary | ICD-10-CM | POA: Diagnosis not present

## 2021-06-30 DIAGNOSIS — S12100A Unspecified displaced fracture of second cervical vertebra, initial encounter for closed fracture: Secondary | ICD-10-CM | POA: Diagnosis not present

## 2021-06-30 MED ORDER — LORAZEPAM 2 MG/ML IJ SOLN
1.0000 mg | Freq: Once | INTRAMUSCULAR | Status: AC
  Administered 2021-06-30: 1 mg via INTRAMUSCULAR

## 2021-06-30 MED ORDER — LORAZEPAM 2 MG/ML IJ SOLN
1.0000 mg | Freq: Once | INTRAMUSCULAR | Status: DC
  Filled 2021-06-30: qty 1

## 2021-06-30 NOTE — ED Provider Notes (Signed)
[facility] DEPT Provider Note   CSN: [id number] Arrival date & time: 06/30/21  2133     History  No chief complaint on file.   [name] is a 81 y.o. female who presents to the emergency department after being found down at her memory care facility.  Patient is unable to provide any history at all due to her history of dementia.  I was unable to contact her facility to get any supporting historical information.  Patient has no complaints at this time.  HPI     Home Medications Prior to Admission medications   Medication Sig Start Date End Date Taking? Authorizing Provider  amLODipine (NORVASC) 5 MG tablet Take 1 tablet (5 mg total) by mouth daily. 05/21/21   Janith Lima, MD  aspirin 81 MG EC tablet Take 1 tablet (81 mg total) by mouth daily. Swallow whole. 04/26/21   Janith Lima, MD  citalopram (CELEXA) 10 MG tablet Take 1 tablet (10 mg total) by mouth daily. 05/21/21   Janith Lima, MD  Memantine HCl-Donepezil HCl [facility]) 28-10 MG CP24 Take 1 capsule at bedtime 05/21/21   Janith Lima, MD  propranolol (INDERAL) 20 MG tablet Take 1 tablet (20 mg total) by mouth 2 (two) times daily. 05/21/21   Janith Lima, MD      Allergies    Pneumococcal vaccines, Wound dressing adhesive, Ampicillin, Neomycin-bacitracin zn-polymyx, and Penicillins    Review of Systems   Review of Systems  Physical Exam Updated Vital Signs BP (!) 103/51 (BP Location: Right Arm)    Pulse 67    Temp 97.8 F (36.6 C) (Oral)    Resp 15    SpO2 90%  Physical Exam Vitals and nursing note reviewed.  Constitutional:      General: She is not in acute distress.    Appearance: Normal appearance. She is well-developed. She is not diaphoretic.  HENT:     Head: Normocephalic and atraumatic.     Right Ear: External ear normal.     Left Ear: External ear normal.     Nose: Nose normal.     Mouth/Throat:     Mouth: Mucous membranes are moist.  Eyes:     General:  No scleral icterus.    Extraocular Movements: Extraocular movements intact.     Conjunctiva/sclera: Conjunctivae normal.     Pupils: Pupils are equal, round, and reactive to light.  Neck:     Comments: No midline tenderness Cardiovascular:     Rate and Rhythm: Normal rate and regular rhythm.     Heart sounds: Normal heart sounds. No murmur heard.   No friction rub. No gallop.  Pulmonary:     Effort: Pulmonary effort is normal. No respiratory distress.     Breath sounds: Normal breath sounds.  Abdominal:     General: Bowel sounds are normal. There is no distension.     Palpations: Abdomen is soft. There is no mass.     Tenderness: There is no abdominal tenderness. There is no guarding.  Musculoskeletal:     Cervical back: Normal range of motion. No tenderness.     Comments: No tenderness over the left hip No tender passive range of motion of the left hip Moves all extremities without ataxia, no fractures bruises or deformities, no midline spinal tenderness, no tenderness to anterior chest wall or abdomen   Skin:    General: Skin is warm and dry.  Neurological:     Mental  Status: She is alert and oriented to person, place, and time.  Psychiatric:        Behavior: Behavior normal.    ED Results / Procedures / Treatments   Labs (all labs ordered are listed, but only abnormal results are displayed) Labs Reviewed - No data to display  EKG None  Radiology CT Head Wo Contrast  Result Date: 06/30/2021 CLINICAL DATA:  Head trauma.  Found on the floor. EXAM: CT HEAD WITHOUT CONTRAST TECHNIQUE: Contiguous axial images were obtained from the base of the skull through the vertex without intravenous contrast. RADIATION DOSE REDUCTION: This exam was performed according to the departmental dose-optimization program which includes automated exposure control, adjustment of the mA and/or kV according to patient size and/or use of iterative reconstruction technique. COMPARISON:  05/20/2021  FINDINGS: Brain: Generalized atrophy. Chronic small-vessel ischemic changes of the hemispheric white matter with ex vacuo enlargement the ventricles. No evidence of acute infarction, mass lesion, hemorrhage, hydrocephalus or extra-axial collection. Vascular: There is atherosclerotic calcification of the major vessels at the base of the brain. Skull: Negative Sinuses/Orbits: Clear/normal Other: None IMPRESSION: No acute or traumatic finding. Chronic small-vessel ischemic change of the white matter. Generalized atrophy, with ex vacuo enlargement of the lateral ventricles, stable over time. Electronically Signed   By: Nelson Chimes M.D.   On: 06/30/2021 23:33   CT Cervical Spine Wo Contrast  Result Date: 06/30/2021 CLINICAL DATA:  Neck trauma.  Found on the floor. EXAM: CT CERVICAL SPINE WITHOUT CONTRAST TECHNIQUE: Multidetector CT imaging of the cervical spine was performed without intravenous contrast. Multiplanar CT image reconstructions were also generated. RADIATION DOSE REDUCTION: This exam was performed according to the departmental dose-optimization program which includes automated exposure control, adjustment of the mA and/or kV according to patient size and/or use of iterative reconstruction technique. COMPARISON:  05/20/2021 FINDINGS: Alignment: The no acute traumatic malalignment. Chronic degenerative anterolisthesis at C3-4 measuring 3-4 mm. Skull base and vertebrae: No acute fracture in the cervical region. There is a minimal superior endplate fracture at T2 with loss of height of less than 10%. Soft tissues and spinal canal: Negative Disc levels: The foramen magnum is widely patent. There is ordinary mild osteoarthritis of the C1-2 articulation but no encroachment upon the neural structures. C2-3: Minimal disc bulge.  No stenosis. C3-4: Chronic facet arthropathy with 4 mm of anterolisthesis. Mild canal stenosis. Moderate bilateral foraminal stenosis. No change. C4 through C7: Previous ACDF with solid  union and sufficient patency of the canal and foramina. C7-T1: Chronic degenerative spondylosis with disc space narrowing and bilateral osteophytic encroachment. No change. T1-2: No disc level pathology. Superior endplate fracture T2 as noted above. Upper chest: Negative Other: None IMPRESSION: Minimal superior endplate fracture of T2 with loss of height of less than 10% and no retropulsed bone. No acute cervical fracture. Previous ACDF C4 through C7. Chronic degenerative anterolisthesis at C3-4 with moderate canal and severe bilateral foraminal stenoses. Lesser bilateral foraminal narrowing at C7-T1 due to osteophytic encroachment. Electronically Signed   By: Nelson Chimes M.D.   On: 06/30/2021 23:20   DG Hip Unilat W or Wo Pelvis 2-3 Views Left  Result Date: 06/30/2021 CLINICAL DATA:  Fall, left hip pain EXAM: DG HIP (WITH OR WITHOUT PELVIS) 2-3V LEFT COMPARISON:  None. FINDINGS: Normal alignment. No acute fracture or dislocation. Mild right and moderate left hip degenerative arthritis noted. Mild heterotopic ossifications seen adjacent to the left greater trochanter. Soft tissues are otherwise unremarkable. Lumbosacral fusion with instrumentation is partially visualized. IMPRESSION:  No acute fracture or dislocation. Moderate left hip degenerative arthritis Electronically Signed   By: Fidela Salisbury M.D.   On: 06/30/2021 22:25    Procedures Procedures    Medications Ordered in ED Medications  LORazepam (ATIVAN) injection 1 mg (1 mg Intramuscular Given 06/30/21 2353)    ED Course/ Medical Decision Making/ A&P Clinical Course as of 07/01/21 0304  Thu Jun 30, 2021  2220 DG Hip Unilat W or Wo Pelvis 2-3 Views Left I reviewed the images- I see no acute findings on plain films [AH]  2250 Attempted to contact [facility] to get a history from her memory care unit. No answer. Will obtain CT head/cspine [AH]  2322 Case discussed with the patient's daughter Ms. [name].  She states that she was called by  [facility] and told that her mother was found on the floor.  That they did not think that there was anything particularly wrong with her but wanted to send her to the ER to get checked out.  That is the extent of her knowledge. [AH]    Clinical Course User Index [AH] Margarita Mail, PA-C                           Medical Decision Making Patient here after being found on the floor of her memory care unit. She has no clinical evidence of trauma on physical exam. Patient's imaging shows T2 fracture less than 10% height loss, no other abnormal findings and patient does not act as though she has pain in her back.  Will d/c to her snf.  Amount and/or Complexity of Data Reviewed Independent Historian:     Details: patient's daughter Radiology: ordered and independent interpretation performed. Decision-making details documented in ED Course.    Details: I reviewed all images. no acute findigs except for T2 compression frx seen on CT cspine  Risk Prescription drug management.   Final Clinical Impression(s) / ED Diagnoses Final diagnoses:  Fall, initial encounter    Rx / DC Orders ED Discharge Orders     None         Margarita Mail, PA-C 07/01/21 0304    Orpah Greek, MD 07/01/21 [phone]

## 2021-06-30 NOTE — ED Notes (Signed)
[facility] called for transport back to facility 

## 2021-06-30 NOTE — Telephone Encounter (Signed)
Pt's daughter checking status of UA request, informed daughter her request has been routed to provider

## 2021-06-30 NOTE — ED Triage Notes (Signed)
Pt brought in by ems from [facility]; pt was found lying on floor; pt c/o left hip pain; no obvious deformity noted;  BP 160/102 Cbg 109 P 67 O2 95%

## 2021-06-30 NOTE — Telephone Encounter (Signed)
Caller, pts daughter states resident MD advised her to  request an order for a UA   Caller requesting order for UA sent to [facility], pts nurse name is Orinda Kenner requesting a call back to discuss request

## 2021-06-30 NOTE — ED Notes (Signed)
Pt found wandering in hallway; pt not wanting to go back to her room; pt assisted to her bed and order for ativan given

## 2021-06-30 NOTE — Discharge Instructions (Addendum)
Patient's CT scan showed an age indeterminate T2 vertebral body compression fracture with less than 10 % height loss.There is nothing to be done about this and should heal without any other intervention. The remainder of her images, hip and head are negative.

## 2021-07-01 DIAGNOSIS — R2681 Unsteadiness on feet: Secondary | ICD-10-CM | POA: Diagnosis not present

## 2021-07-01 DIAGNOSIS — M4802 Spinal stenosis, cervical region: Secondary | ICD-10-CM | POA: Diagnosis not present

## 2021-07-01 DIAGNOSIS — R296 Repeated falls: Secondary | ICD-10-CM | POA: Diagnosis not present

## 2021-07-01 DIAGNOSIS — W19XXXA Unspecified fall, initial encounter: Secondary | ICD-10-CM | POA: Diagnosis not present

## 2021-07-01 DIAGNOSIS — S065X0A Traumatic subdural hemorrhage without loss of consciousness, initial encounter: Secondary | ICD-10-CM | POA: Diagnosis not present

## 2021-07-01 DIAGNOSIS — I959 Hypotension, unspecified: Secondary | ICD-10-CM | POA: Diagnosis not present

## 2021-07-01 DIAGNOSIS — R4182 Altered mental status, unspecified: Secondary | ICD-10-CM | POA: Diagnosis not present

## 2021-07-01 DIAGNOSIS — R0902 Hypoxemia: Secondary | ICD-10-CM | POA: Diagnosis not present

## 2021-07-01 DIAGNOSIS — I6201 Nontraumatic acute subdural hemorrhage: Secondary | ICD-10-CM | POA: Diagnosis not present

## 2021-07-01 DIAGNOSIS — R262 Difficulty in walking, not elsewhere classified: Secondary | ICD-10-CM | POA: Diagnosis not present

## 2021-07-01 DIAGNOSIS — Z7401 Bed confinement status: Secondary | ICD-10-CM | POA: Diagnosis not present

## 2021-07-01 NOTE — ED Notes (Signed)
Pt found trying to climb out of bed; pt given blanket and placed back in bed

## 2021-07-01 NOTE — Telephone Encounter (Signed)
Pt's daughter is upset that UA order request could not be granted. She has scheduled an OV for Monday @ 2.40pm with Dr. Cathlean Cower. She stated pt also fell last night and broke her vertebrae and is in a lot of pain. She stated that she hopes she can get her here on Monday.

## 2021-07-04 ENCOUNTER — Ambulatory Visit: Payer: Medicare Other | Admitting: Internal Medicine

## 2021-07-04 DIAGNOSIS — S065X0A Traumatic subdural hemorrhage without loss of consciousness, initial encounter: Secondary | ICD-10-CM | POA: Diagnosis not present

## 2021-07-04 DIAGNOSIS — M4802 Spinal stenosis, cervical region: Secondary | ICD-10-CM | POA: Diagnosis not present

## 2021-07-04 DIAGNOSIS — I6201 Nontraumatic acute subdural hemorrhage: Secondary | ICD-10-CM | POA: Diagnosis not present

## 2021-07-04 DIAGNOSIS — R262 Difficulty in walking, not elsewhere classified: Secondary | ICD-10-CM | POA: Diagnosis not present

## 2021-07-04 DIAGNOSIS — R2681 Unsteadiness on feet: Secondary | ICD-10-CM | POA: Diagnosis not present

## 2021-07-04 DIAGNOSIS — R296 Repeated falls: Secondary | ICD-10-CM | POA: Diagnosis not present

## 2021-07-05 DIAGNOSIS — M4802 Spinal stenosis, cervical region: Secondary | ICD-10-CM | POA: Diagnosis not present

## 2021-07-05 DIAGNOSIS — R2681 Unsteadiness on feet: Secondary | ICD-10-CM | POA: Diagnosis not present

## 2021-07-05 DIAGNOSIS — S065X0A Traumatic subdural hemorrhage without loss of consciousness, initial encounter: Secondary | ICD-10-CM | POA: Diagnosis not present

## 2021-07-05 DIAGNOSIS — R262 Difficulty in walking, not elsewhere classified: Secondary | ICD-10-CM | POA: Diagnosis not present

## 2021-07-05 DIAGNOSIS — R296 Repeated falls: Secondary | ICD-10-CM | POA: Diagnosis not present

## 2021-07-05 DIAGNOSIS — I6201 Nontraumatic acute subdural hemorrhage: Secondary | ICD-10-CM | POA: Diagnosis not present

## 2021-07-06 DIAGNOSIS — M4802 Spinal stenosis, cervical region: Secondary | ICD-10-CM | POA: Diagnosis not present

## 2021-07-06 DIAGNOSIS — R296 Repeated falls: Secondary | ICD-10-CM | POA: Diagnosis not present

## 2021-07-06 DIAGNOSIS — R262 Difficulty in walking, not elsewhere classified: Secondary | ICD-10-CM | POA: Diagnosis not present

## 2021-07-06 DIAGNOSIS — R2681 Unsteadiness on feet: Secondary | ICD-10-CM | POA: Diagnosis not present

## 2021-07-06 DIAGNOSIS — I6201 Nontraumatic acute subdural hemorrhage: Secondary | ICD-10-CM | POA: Diagnosis not present

## 2021-07-06 DIAGNOSIS — S065X0A Traumatic subdural hemorrhage without loss of consciousness, initial encounter: Secondary | ICD-10-CM | POA: Diagnosis not present

## 2021-07-07 DIAGNOSIS — M4802 Spinal stenosis, cervical region: Secondary | ICD-10-CM | POA: Diagnosis not present

## 2021-07-07 DIAGNOSIS — R262 Difficulty in walking, not elsewhere classified: Secondary | ICD-10-CM | POA: Diagnosis not present

## 2021-07-07 DIAGNOSIS — R296 Repeated falls: Secondary | ICD-10-CM | POA: Diagnosis not present

## 2021-07-07 DIAGNOSIS — R2681 Unsteadiness on feet: Secondary | ICD-10-CM | POA: Diagnosis not present

## 2021-07-07 DIAGNOSIS — I6201 Nontraumatic acute subdural hemorrhage: Secondary | ICD-10-CM | POA: Diagnosis not present

## 2021-07-07 DIAGNOSIS — S065X0A Traumatic subdural hemorrhage without loss of consciousness, initial encounter: Secondary | ICD-10-CM | POA: Diagnosis not present

## 2021-07-11 DIAGNOSIS — I6201 Nontraumatic acute subdural hemorrhage: Secondary | ICD-10-CM | POA: Diagnosis not present

## 2021-07-11 DIAGNOSIS — S065X0A Traumatic subdural hemorrhage without loss of consciousness, initial encounter: Secondary | ICD-10-CM | POA: Diagnosis not present

## 2021-07-11 DIAGNOSIS — R2681 Unsteadiness on feet: Secondary | ICD-10-CM | POA: Diagnosis not present

## 2021-07-11 DIAGNOSIS — R262 Difficulty in walking, not elsewhere classified: Secondary | ICD-10-CM | POA: Diagnosis not present

## 2021-07-11 DIAGNOSIS — R296 Repeated falls: Secondary | ICD-10-CM | POA: Diagnosis not present

## 2021-07-11 DIAGNOSIS — M4802 Spinal stenosis, cervical region: Secondary | ICD-10-CM | POA: Diagnosis not present

## 2021-07-13 DIAGNOSIS — Z79899 Other long term (current) drug therapy: Secondary | ICD-10-CM | POA: Diagnosis not present

## 2021-07-13 DIAGNOSIS — N390 Urinary tract infection, site not specified: Secondary | ICD-10-CM | POA: Diagnosis not present

## 2021-07-13 DIAGNOSIS — R262 Difficulty in walking, not elsewhere classified: Secondary | ICD-10-CM | POA: Diagnosis not present

## 2021-07-13 DIAGNOSIS — M4802 Spinal stenosis, cervical region: Secondary | ICD-10-CM | POA: Diagnosis not present

## 2021-07-13 DIAGNOSIS — I6201 Nontraumatic acute subdural hemorrhage: Secondary | ICD-10-CM | POA: Diagnosis not present

## 2021-07-13 DIAGNOSIS — R2681 Unsteadiness on feet: Secondary | ICD-10-CM | POA: Diagnosis not present

## 2021-07-13 DIAGNOSIS — R296 Repeated falls: Secondary | ICD-10-CM | POA: Diagnosis not present

## 2021-07-13 DIAGNOSIS — S065X0A Traumatic subdural hemorrhage without loss of consciousness, initial encounter: Secondary | ICD-10-CM | POA: Diagnosis not present

## 2021-07-14 DIAGNOSIS — R296 Repeated falls: Secondary | ICD-10-CM | POA: Diagnosis not present

## 2021-07-14 DIAGNOSIS — M4802 Spinal stenosis, cervical region: Secondary | ICD-10-CM | POA: Diagnosis not present

## 2021-07-14 DIAGNOSIS — R262 Difficulty in walking, not elsewhere classified: Secondary | ICD-10-CM | POA: Diagnosis not present

## 2021-07-14 DIAGNOSIS — R2681 Unsteadiness on feet: Secondary | ICD-10-CM | POA: Diagnosis not present

## 2021-07-14 DIAGNOSIS — S065X0A Traumatic subdural hemorrhage without loss of consciousness, initial encounter: Secondary | ICD-10-CM | POA: Diagnosis not present

## 2021-07-14 DIAGNOSIS — I6201 Nontraumatic acute subdural hemorrhage: Secondary | ICD-10-CM | POA: Diagnosis not present

## 2021-07-15 DIAGNOSIS — R262 Difficulty in walking, not elsewhere classified: Secondary | ICD-10-CM | POA: Diagnosis not present

## 2021-07-15 DIAGNOSIS — M4802 Spinal stenosis, cervical region: Secondary | ICD-10-CM | POA: Diagnosis not present

## 2021-07-15 DIAGNOSIS — R2681 Unsteadiness on feet: Secondary | ICD-10-CM | POA: Diagnosis not present

## 2021-07-15 DIAGNOSIS — S065X0A Traumatic subdural hemorrhage without loss of consciousness, initial encounter: Secondary | ICD-10-CM | POA: Diagnosis not present

## 2021-07-15 DIAGNOSIS — I6201 Nontraumatic acute subdural hemorrhage: Secondary | ICD-10-CM | POA: Diagnosis not present

## 2021-07-15 DIAGNOSIS — R296 Repeated falls: Secondary | ICD-10-CM | POA: Diagnosis not present

## 2021-07-16 DIAGNOSIS — R296 Repeated falls: Secondary | ICD-10-CM | POA: Diagnosis not present

## 2021-07-16 DIAGNOSIS — I6201 Nontraumatic acute subdural hemorrhage: Secondary | ICD-10-CM | POA: Diagnosis not present

## 2021-07-16 DIAGNOSIS — R262 Difficulty in walking, not elsewhere classified: Secondary | ICD-10-CM | POA: Diagnosis not present

## 2021-07-16 DIAGNOSIS — R2681 Unsteadiness on feet: Secondary | ICD-10-CM | POA: Diagnosis not present

## 2021-07-16 DIAGNOSIS — S065X0A Traumatic subdural hemorrhage without loss of consciousness, initial encounter: Secondary | ICD-10-CM | POA: Diagnosis not present

## 2021-07-16 DIAGNOSIS — M4802 Spinal stenosis, cervical region: Secondary | ICD-10-CM | POA: Diagnosis not present

## 2021-07-18 DIAGNOSIS — S065X0A Traumatic subdural hemorrhage without loss of consciousness, initial encounter: Secondary | ICD-10-CM | POA: Diagnosis not present

## 2021-07-18 DIAGNOSIS — M4802 Spinal stenosis, cervical region: Secondary | ICD-10-CM | POA: Diagnosis not present

## 2021-07-18 DIAGNOSIS — I6201 Nontraumatic acute subdural hemorrhage: Secondary | ICD-10-CM | POA: Diagnosis not present

## 2021-07-18 DIAGNOSIS — R296 Repeated falls: Secondary | ICD-10-CM | POA: Diagnosis not present

## 2021-07-18 DIAGNOSIS — R262 Difficulty in walking, not elsewhere classified: Secondary | ICD-10-CM | POA: Diagnosis not present

## 2021-07-18 DIAGNOSIS — R2681 Unsteadiness on feet: Secondary | ICD-10-CM | POA: Diagnosis not present

## 2021-07-19 DIAGNOSIS — R2681 Unsteadiness on feet: Secondary | ICD-10-CM | POA: Diagnosis not present

## 2021-07-19 DIAGNOSIS — R262 Difficulty in walking, not elsewhere classified: Secondary | ICD-10-CM | POA: Diagnosis not present

## 2021-07-19 DIAGNOSIS — I6201 Nontraumatic acute subdural hemorrhage: Secondary | ICD-10-CM | POA: Diagnosis not present

## 2021-07-19 DIAGNOSIS — M4802 Spinal stenosis, cervical region: Secondary | ICD-10-CM | POA: Diagnosis not present

## 2021-07-19 DIAGNOSIS — R296 Repeated falls: Secondary | ICD-10-CM | POA: Diagnosis not present

## 2021-07-19 DIAGNOSIS — S065X0A Traumatic subdural hemorrhage without loss of consciousness, initial encounter: Secondary | ICD-10-CM | POA: Diagnosis not present

## 2021-07-20 DIAGNOSIS — N183 Chronic kidney disease, stage 3 unspecified: Secondary | ICD-10-CM | POA: Diagnosis not present

## 2021-07-20 DIAGNOSIS — M150 Primary generalized (osteo)arthritis: Secondary | ICD-10-CM | POA: Diagnosis not present

## 2021-07-20 DIAGNOSIS — S065X0A Traumatic subdural hemorrhage without loss of consciousness, initial encounter: Secondary | ICD-10-CM | POA: Diagnosis not present

## 2021-07-20 DIAGNOSIS — R296 Repeated falls: Secondary | ICD-10-CM | POA: Diagnosis not present

## 2021-07-20 DIAGNOSIS — R278 Other lack of coordination: Secondary | ICD-10-CM | POA: Diagnosis not present

## 2021-07-20 DIAGNOSIS — R2689 Other abnormalities of gait and mobility: Secondary | ICD-10-CM | POA: Diagnosis not present

## 2021-07-20 DIAGNOSIS — R2681 Unsteadiness on feet: Secondary | ICD-10-CM | POA: Diagnosis not present

## 2021-07-20 DIAGNOSIS — I6201 Nontraumatic acute subdural hemorrhage: Secondary | ICD-10-CM | POA: Diagnosis not present

## 2021-07-20 DIAGNOSIS — D508 Other iron deficiency anemias: Secondary | ICD-10-CM | POA: Diagnosis not present

## 2021-07-20 DIAGNOSIS — F02B0 Dementia in other diseases classified elsewhere, moderate, without behavioral disturbance, psychotic disturbance, mood disturbance, and anxiety: Secondary | ICD-10-CM | POA: Diagnosis not present

## 2021-07-20 DIAGNOSIS — I129 Hypertensive chronic kidney disease with stage 1 through stage 4 chronic kidney disease, or unspecified chronic kidney disease: Secondary | ICD-10-CM | POA: Diagnosis not present

## 2021-07-20 DIAGNOSIS — F331 Major depressive disorder, recurrent, moderate: Secondary | ICD-10-CM | POA: Diagnosis not present

## 2021-07-20 DIAGNOSIS — R262 Difficulty in walking, not elsewhere classified: Secondary | ICD-10-CM | POA: Diagnosis not present

## 2021-07-20 DIAGNOSIS — K5901 Slow transit constipation: Secondary | ICD-10-CM | POA: Diagnosis not present

## 2021-07-20 DIAGNOSIS — M6281 Muscle weakness (generalized): Secondary | ICD-10-CM | POA: Diagnosis not present

## 2021-07-20 DIAGNOSIS — M4802 Spinal stenosis, cervical region: Secondary | ICD-10-CM | POA: Diagnosis not present

## 2021-07-20 DIAGNOSIS — G301 Alzheimer's disease with late onset: Secondary | ICD-10-CM | POA: Diagnosis not present

## 2021-07-21 DIAGNOSIS — R262 Difficulty in walking, not elsewhere classified: Secondary | ICD-10-CM | POA: Diagnosis not present

## 2021-07-21 DIAGNOSIS — M4802 Spinal stenosis, cervical region: Secondary | ICD-10-CM | POA: Diagnosis not present

## 2021-07-21 DIAGNOSIS — S065X0A Traumatic subdural hemorrhage without loss of consciousness, initial encounter: Secondary | ICD-10-CM | POA: Diagnosis not present

## 2021-07-21 DIAGNOSIS — R296 Repeated falls: Secondary | ICD-10-CM | POA: Diagnosis not present

## 2021-07-21 DIAGNOSIS — I6201 Nontraumatic acute subdural hemorrhage: Secondary | ICD-10-CM | POA: Diagnosis not present

## 2021-07-21 DIAGNOSIS — R2681 Unsteadiness on feet: Secondary | ICD-10-CM | POA: Diagnosis not present

## 2021-07-25 ENCOUNTER — Ambulatory Visit: Payer: Medicare Other | Admitting: Neurology

## 2021-07-25 DIAGNOSIS — R262 Difficulty in walking, not elsewhere classified: Secondary | ICD-10-CM | POA: Diagnosis not present

## 2021-07-25 DIAGNOSIS — S065X0A Traumatic subdural hemorrhage without loss of consciousness, initial encounter: Secondary | ICD-10-CM | POA: Diagnosis not present

## 2021-07-25 DIAGNOSIS — R296 Repeated falls: Secondary | ICD-10-CM | POA: Diagnosis not present

## 2021-07-25 DIAGNOSIS — I6201 Nontraumatic acute subdural hemorrhage: Secondary | ICD-10-CM | POA: Diagnosis not present

## 2021-07-25 DIAGNOSIS — M4802 Spinal stenosis, cervical region: Secondary | ICD-10-CM | POA: Diagnosis not present

## 2021-07-25 DIAGNOSIS — R2681 Unsteadiness on feet: Secondary | ICD-10-CM | POA: Diagnosis not present

## 2021-07-26 ENCOUNTER — Ambulatory Visit: Payer: Medicare Other | Admitting: Neurology

## 2021-07-26 DIAGNOSIS — R262 Difficulty in walking, not elsewhere classified: Secondary | ICD-10-CM | POA: Diagnosis not present

## 2021-07-26 DIAGNOSIS — S065X0A Traumatic subdural hemorrhage without loss of consciousness, initial encounter: Secondary | ICD-10-CM | POA: Diagnosis not present

## 2021-07-26 DIAGNOSIS — R296 Repeated falls: Secondary | ICD-10-CM | POA: Diagnosis not present

## 2021-07-26 DIAGNOSIS — I6201 Nontraumatic acute subdural hemorrhage: Secondary | ICD-10-CM | POA: Diagnosis not present

## 2021-07-26 DIAGNOSIS — M4802 Spinal stenosis, cervical region: Secondary | ICD-10-CM | POA: Diagnosis not present

## 2021-07-26 DIAGNOSIS — R2681 Unsteadiness on feet: Secondary | ICD-10-CM | POA: Diagnosis not present

## 2021-07-27 DIAGNOSIS — R296 Repeated falls: Secondary | ICD-10-CM | POA: Diagnosis not present

## 2021-07-27 DIAGNOSIS — R2681 Unsteadiness on feet: Secondary | ICD-10-CM | POA: Diagnosis not present

## 2021-07-27 DIAGNOSIS — R262 Difficulty in walking, not elsewhere classified: Secondary | ICD-10-CM | POA: Diagnosis not present

## 2021-07-27 DIAGNOSIS — M4802 Spinal stenosis, cervical region: Secondary | ICD-10-CM | POA: Diagnosis not present

## 2021-07-27 DIAGNOSIS — S065X0A Traumatic subdural hemorrhage without loss of consciousness, initial encounter: Secondary | ICD-10-CM | POA: Diagnosis not present

## 2021-07-27 DIAGNOSIS — I6201 Nontraumatic acute subdural hemorrhage: Secondary | ICD-10-CM | POA: Diagnosis not present

## 2021-07-28 DIAGNOSIS — R262 Difficulty in walking, not elsewhere classified: Secondary | ICD-10-CM | POA: Diagnosis not present

## 2021-07-28 DIAGNOSIS — R296 Repeated falls: Secondary | ICD-10-CM | POA: Diagnosis not present

## 2021-07-28 DIAGNOSIS — I6201 Nontraumatic acute subdural hemorrhage: Secondary | ICD-10-CM | POA: Diagnosis not present

## 2021-07-28 DIAGNOSIS — R2681 Unsteadiness on feet: Secondary | ICD-10-CM | POA: Diagnosis not present

## 2021-07-28 DIAGNOSIS — S065X0A Traumatic subdural hemorrhage without loss of consciousness, initial encounter: Secondary | ICD-10-CM | POA: Diagnosis not present

## 2021-07-28 DIAGNOSIS — M4802 Spinal stenosis, cervical region: Secondary | ICD-10-CM | POA: Diagnosis not present

## 2021-07-29 DIAGNOSIS — R296 Repeated falls: Secondary | ICD-10-CM | POA: Diagnosis not present

## 2021-07-29 DIAGNOSIS — R2681 Unsteadiness on feet: Secondary | ICD-10-CM | POA: Diagnosis not present

## 2021-07-29 DIAGNOSIS — I6201 Nontraumatic acute subdural hemorrhage: Secondary | ICD-10-CM | POA: Diagnosis not present

## 2021-07-29 DIAGNOSIS — S065X0A Traumatic subdural hemorrhage without loss of consciousness, initial encounter: Secondary | ICD-10-CM | POA: Diagnosis not present

## 2021-07-29 DIAGNOSIS — M4802 Spinal stenosis, cervical region: Secondary | ICD-10-CM | POA: Diagnosis not present

## 2021-07-29 DIAGNOSIS — R262 Difficulty in walking, not elsewhere classified: Secondary | ICD-10-CM | POA: Diagnosis not present

## 2021-08-01 DIAGNOSIS — I6201 Nontraumatic acute subdural hemorrhage: Secondary | ICD-10-CM | POA: Diagnosis not present

## 2021-08-01 DIAGNOSIS — R296 Repeated falls: Secondary | ICD-10-CM | POA: Diagnosis not present

## 2021-08-01 DIAGNOSIS — R262 Difficulty in walking, not elsewhere classified: Secondary | ICD-10-CM | POA: Diagnosis not present

## 2021-08-01 DIAGNOSIS — S065X0A Traumatic subdural hemorrhage without loss of consciousness, initial encounter: Secondary | ICD-10-CM | POA: Diagnosis not present

## 2021-08-01 DIAGNOSIS — R2681 Unsteadiness on feet: Secondary | ICD-10-CM | POA: Diagnosis not present

## 2021-08-01 DIAGNOSIS — M4802 Spinal stenosis, cervical region: Secondary | ICD-10-CM | POA: Diagnosis not present

## 2021-08-02 DIAGNOSIS — I6201 Nontraumatic acute subdural hemorrhage: Secondary | ICD-10-CM | POA: Diagnosis not present

## 2021-08-02 DIAGNOSIS — R296 Repeated falls: Secondary | ICD-10-CM | POA: Diagnosis not present

## 2021-08-02 DIAGNOSIS — S065X0A Traumatic subdural hemorrhage without loss of consciousness, initial encounter: Secondary | ICD-10-CM | POA: Diagnosis not present

## 2021-08-02 DIAGNOSIS — R2681 Unsteadiness on feet: Secondary | ICD-10-CM | POA: Diagnosis not present

## 2021-08-02 DIAGNOSIS — R262 Difficulty in walking, not elsewhere classified: Secondary | ICD-10-CM | POA: Diagnosis not present

## 2021-08-02 DIAGNOSIS — M4802 Spinal stenosis, cervical region: Secondary | ICD-10-CM | POA: Diagnosis not present

## 2021-08-03 DIAGNOSIS — S065X0A Traumatic subdural hemorrhage without loss of consciousness, initial encounter: Secondary | ICD-10-CM | POA: Diagnosis not present

## 2021-08-03 DIAGNOSIS — R262 Difficulty in walking, not elsewhere classified: Secondary | ICD-10-CM | POA: Diagnosis not present

## 2021-08-03 DIAGNOSIS — M4802 Spinal stenosis, cervical region: Secondary | ICD-10-CM | POA: Diagnosis not present

## 2021-08-03 DIAGNOSIS — R2681 Unsteadiness on feet: Secondary | ICD-10-CM | POA: Diagnosis not present

## 2021-08-03 DIAGNOSIS — R296 Repeated falls: Secondary | ICD-10-CM | POA: Diagnosis not present

## 2021-08-03 DIAGNOSIS — I6201 Nontraumatic acute subdural hemorrhage: Secondary | ICD-10-CM | POA: Diagnosis not present

## 2021-08-04 DIAGNOSIS — R262 Difficulty in walking, not elsewhere classified: Secondary | ICD-10-CM | POA: Diagnosis not present

## 2021-08-04 DIAGNOSIS — I6201 Nontraumatic acute subdural hemorrhage: Secondary | ICD-10-CM | POA: Diagnosis not present

## 2021-08-04 DIAGNOSIS — R296 Repeated falls: Secondary | ICD-10-CM | POA: Diagnosis not present

## 2021-08-04 DIAGNOSIS — M4802 Spinal stenosis, cervical region: Secondary | ICD-10-CM | POA: Diagnosis not present

## 2021-08-04 DIAGNOSIS — S065X0A Traumatic subdural hemorrhage without loss of consciousness, initial encounter: Secondary | ICD-10-CM | POA: Diagnosis not present

## 2021-08-04 DIAGNOSIS — R2681 Unsteadiness on feet: Secondary | ICD-10-CM | POA: Diagnosis not present

## 2021-08-09 DIAGNOSIS — M4802 Spinal stenosis, cervical region: Secondary | ICD-10-CM | POA: Diagnosis not present

## 2021-08-09 DIAGNOSIS — R262 Difficulty in walking, not elsewhere classified: Secondary | ICD-10-CM | POA: Diagnosis not present

## 2021-08-09 DIAGNOSIS — I6201 Nontraumatic acute subdural hemorrhage: Secondary | ICD-10-CM | POA: Diagnosis not present

## 2021-08-09 DIAGNOSIS — R296 Repeated falls: Secondary | ICD-10-CM | POA: Diagnosis not present

## 2021-08-09 DIAGNOSIS — R2681 Unsteadiness on feet: Secondary | ICD-10-CM | POA: Diagnosis not present

## 2021-08-09 DIAGNOSIS — S065X0A Traumatic subdural hemorrhage without loss of consciousness, initial encounter: Secondary | ICD-10-CM | POA: Diagnosis not present

## 2021-08-10 DIAGNOSIS — R262 Difficulty in walking, not elsewhere classified: Secondary | ICD-10-CM | POA: Diagnosis not present

## 2021-08-10 DIAGNOSIS — R2681 Unsteadiness on feet: Secondary | ICD-10-CM | POA: Diagnosis not present

## 2021-08-10 DIAGNOSIS — S065X0A Traumatic subdural hemorrhage without loss of consciousness, initial encounter: Secondary | ICD-10-CM | POA: Diagnosis not present

## 2021-08-10 DIAGNOSIS — M4802 Spinal stenosis, cervical region: Secondary | ICD-10-CM | POA: Diagnosis not present

## 2021-08-10 DIAGNOSIS — R296 Repeated falls: Secondary | ICD-10-CM | POA: Diagnosis not present

## 2021-08-10 DIAGNOSIS — I6201 Nontraumatic acute subdural hemorrhage: Secondary | ICD-10-CM | POA: Diagnosis not present

## 2021-08-11 DIAGNOSIS — R2681 Unsteadiness on feet: Secondary | ICD-10-CM | POA: Diagnosis not present

## 2021-08-11 DIAGNOSIS — S065X0A Traumatic subdural hemorrhage without loss of consciousness, initial encounter: Secondary | ICD-10-CM | POA: Diagnosis not present

## 2021-08-11 DIAGNOSIS — R296 Repeated falls: Secondary | ICD-10-CM | POA: Diagnosis not present

## 2021-08-11 DIAGNOSIS — M4802 Spinal stenosis, cervical region: Secondary | ICD-10-CM | POA: Diagnosis not present

## 2021-08-11 DIAGNOSIS — R262 Difficulty in walking, not elsewhere classified: Secondary | ICD-10-CM | POA: Diagnosis not present

## 2021-08-11 DIAGNOSIS — I6201 Nontraumatic acute subdural hemorrhage: Secondary | ICD-10-CM | POA: Diagnosis not present

## 2021-08-12 ENCOUNTER — Other Ambulatory Visit: Payer: Self-pay | Admitting: Internal Medicine

## 2021-08-12 DIAGNOSIS — R2681 Unsteadiness on feet: Secondary | ICD-10-CM | POA: Diagnosis not present

## 2021-08-12 DIAGNOSIS — F03918 Unspecified dementia, unspecified severity, with other behavioral disturbance: Secondary | ICD-10-CM | POA: Insufficient documentation

## 2021-08-12 DIAGNOSIS — F5104 Psychophysiologic insomnia: Secondary | ICD-10-CM

## 2021-08-12 DIAGNOSIS — R296 Repeated falls: Secondary | ICD-10-CM | POA: Diagnosis not present

## 2021-08-12 DIAGNOSIS — S065X0A Traumatic subdural hemorrhage without loss of consciousness, initial encounter: Secondary | ICD-10-CM | POA: Diagnosis not present

## 2021-08-12 DIAGNOSIS — I6201 Nontraumatic acute subdural hemorrhage: Secondary | ICD-10-CM | POA: Diagnosis not present

## 2021-08-12 DIAGNOSIS — M4802 Spinal stenosis, cervical region: Secondary | ICD-10-CM | POA: Diagnosis not present

## 2021-08-12 DIAGNOSIS — R262 Difficulty in walking, not elsewhere classified: Secondary | ICD-10-CM | POA: Diagnosis not present

## 2021-08-12 MED ORDER — QUETIAPINE FUMARATE 25 MG PO TABS: 25.0000 mg | ORAL_TABLET | Freq: Two times a day (BID) | ORAL | 0 refills | Status: DC | PRN

## 2021-08-13 DIAGNOSIS — R296 Repeated falls: Secondary | ICD-10-CM | POA: Diagnosis not present

## 2021-08-13 DIAGNOSIS — R262 Difficulty in walking, not elsewhere classified: Secondary | ICD-10-CM | POA: Diagnosis not present

## 2021-08-13 DIAGNOSIS — R2681 Unsteadiness on feet: Secondary | ICD-10-CM | POA: Diagnosis not present

## 2021-08-13 DIAGNOSIS — I6201 Nontraumatic acute subdural hemorrhage: Secondary | ICD-10-CM | POA: Diagnosis not present

## 2021-08-13 DIAGNOSIS — S065X0A Traumatic subdural hemorrhage without loss of consciousness, initial encounter: Secondary | ICD-10-CM | POA: Diagnosis not present

## 2021-08-13 DIAGNOSIS — M4802 Spinal stenosis, cervical region: Secondary | ICD-10-CM | POA: Diagnosis not present

## 2021-08-15 ENCOUNTER — Telehealth: Payer: Medicare Other

## 2021-08-15 DIAGNOSIS — I6201 Nontraumatic acute subdural hemorrhage: Secondary | ICD-10-CM | POA: Diagnosis not present

## 2021-08-15 DIAGNOSIS — R2681 Unsteadiness on feet: Secondary | ICD-10-CM | POA: Diagnosis not present

## 2021-08-15 DIAGNOSIS — R296 Repeated falls: Secondary | ICD-10-CM | POA: Diagnosis not present

## 2021-08-15 DIAGNOSIS — R262 Difficulty in walking, not elsewhere classified: Secondary | ICD-10-CM | POA: Diagnosis not present

## 2021-08-15 DIAGNOSIS — S065X0A Traumatic subdural hemorrhage without loss of consciousness, initial encounter: Secondary | ICD-10-CM | POA: Diagnosis not present

## 2021-08-15 DIAGNOSIS — M4802 Spinal stenosis, cervical region: Secondary | ICD-10-CM | POA: Diagnosis not present

## 2021-08-15 NOTE — Progress Notes (Deleted)
? ?Chronic Care Management ?Pharmacy Note ? ?08/15/2021 ?Name:  [name] MRN:  [id number] DOB:  1940-07-14 ? ?Subjective: ?[name] is an 81 y.o. year old female who is a primary patient of Janith Lima, MD.  The CCM team was consulted for assistance with disease management and care coordination needs.   ? ?Engaged with patient by telephone for follow up visit in response to provider referral for pharmacy case management and/or care coordination services.  ? ?Consent to Services:  ?The patient was given the following information about Chronic Care Management services today, agreed to services, and gave verbal consent: 1. CCM service includes personalized support from designated clinical staff supervised by the primary care provider, including individualized plan of care and coordination with other care providers 2. 24/7 contact phone numbers for assistance for urgent and routine care needs. 3. Service will only be billed when office clinical staff spend 20 minutes or more in a month to coordinate care. 4. Only one practitioner may furnish and bill the service in a calendar month. 5.The patient may stop CCM services at any time (effective at the end of the month) by phone call to the office staff. 6. The patient will be responsible for cost sharing (co-pay) of up to 20% of the service fee (after annual deductible is met). Patient agreed to services and consent obtained. ? ?Patient Care Team: ?Janith Lima, MD as PCP - General (Internal Medicine) ?Leeroy Cha, MD (Neurosurgery) ?Newton Pigg, MD (Obstetrics and Gynecology) ?Druscilla Brownie, MD (Dermatology) ?Marica Otter, OD (Optometry) ?Foltanski, Cle[place], [facility] as Pharmacist (Pharmacist) ? ?Recent office visits: ?12/23/2020 - Dr. Ronnald Ramp - ED follow up - colchicine, belsomra, and irbesartan dc'd - f/u in 3 months  ? ?Recent consult visits: ?01/21/2021 - Dr. Jannifer Franklin - Neurology - alzheimer's disease - namzeric rx'd - donepezil and memantine  dc'd ? ?Hospital visits: ?06/30/2021 - ED visit for fall (found on floor of memory care unit)  - no changes to medications upon discharge  ?05/20/2021 - ED visit for fall - discharged back to facility - no changes to medications  ?02/10/2021-03/01/2021 - Physical med and rehab admission - subdural hematoma after a fall ?02/02/2021-02/10/2021 - Hospital Admission - recurrent fall - subdural hematoma - discharged to inpatient rehab  ?02/01/2021 - Ed visit - fall - laceration to back of head - discharged rx'd cefdinir  ? ?Objective: ? ?Lab Results  ?Component Value Date  ? CREATININE 1.07 (H) 03/01/2021  ? BUN 18 03/01/2021  ? GFR 42.99 (L) 12/23/2020  ? GFRNONAA 52 (L) 03/01/2021  ? GFRAA >60 12/24/2014  ? NA 139 03/01/2021  ? K 3.4 (L) 03/01/2021  ? CALCIUM 9.0 03/01/2021  ? CO2 27 03/01/2021  ? ? ?Lab Results  ?Component Value Date/Time  ? HGBA1C 6.2 12/23/2020 10:47 AM  ? HGBA1C 7.1 (H) 07/27/2020 03:11 PM  ? GFR 42.99 (L) 12/23/2020 10:47 AM  ? GFR 45.39 (L) 07/27/2020 03:11 PM  ? MICROALBUR 3.6 (H) 07/27/2020 03:11 PM  ? MICROALBUR 1.6 09/26/2018 11:04 AM  ?  ?Last diabetic Eye exam:  ?Lab Results  ?Component Value Date/Time  ? HMDIABEYEEXA No Retinopathy 01/15/2019 12:00 AM  ?  ?Last diabetic Foot exam: No results found for: HMDIABFOOTEX  ? ?Lab Results  ?Component Value Date  ? CHOL 109 07/27/2020  ? HDL 32.30 (L) 07/27/2020  ? [place] 65 08/13/2019  ? LDLDIRECT 62.0 07/27/2020  ? TRIG 250.0 (H) 07/27/2020  ? CHOLHDL 3 07/27/2020  ? ? ? ?  Latest Ref Rng & Units 02/14/2021  ?  7:25 AM 02/11/2021  ?  5:45 AM 02/10/2021  ?  3:50 AM  ?Hepatic Function  ?Total Protein 6.5 - 8.1 g/dL 6.2   6.9   6.7    ?Albumin 3.5 - 5.0 g/dL 2.5   2.6   2.8    ?AST 15 - 41 U/L 35   45   74    ?ALT 0 - 44 U/L 50   86   114    ?Alk Phosphatase 38 - 126 U/L 302   384   397    ?Total Bilirubin 0.3 - 1.2 mg/dL 1.3   1.2   1.2    ? ? ?Lab Results  ?Component Value Date/Time  ? TSH 2.80 07/27/2020 03:11 PM  ? TSH 2.96 08/13/2019 10:46 AM  ? FREET4  0.9 01/12/2009 11:19 AM  ? ? ? ?  Latest Ref Rng & Units 02/21/2021  ? 10:49 AM 02/11/2021  ?  5:45 AM 02/10/2021  ?  3:50 AM  ?CBC  ?WBC 4.0 - 10.5 K/uL 10.3   12.8   14.1    ?Hemoglobin 12.0 - 15.0 g/dL 10.6   10.5   10.0    ?Hematocrit 36.0 - 46.0 % 33.4   33.0   31.3    ?Platelets 150 - 400 K/uL 635   516   496    ? ? ?No results found for: VD25OH ? ?Clinical ASCVD: Yes  ?The ASCVD Risk score (Arnett DK, et al., 2019) failed to calculate for the following reasons: ?  The 2019 ASCVD risk score is only valid for ages 30 to 63   ? ? ?  03/25/2020  ?  3:38 PM 08/13/2019  ?  9:40 AM 12/11/2017  ? 10:18 AM  ?Depression screen PHQ 2/9  ?Decreased Interest 0 0 0  ?Down, Depressed, Hopeless 0 0 0  ?PHQ - 2 Score 0 0 0  ?  ?   ? View : No data to display.  ?  ?  ?  ? ? ?   ? View : No data to display.  ?  ?  ?  ? ? ?Results for KEUNDRA, PETRUCELLI (MRN [id number]) as of 07/08/2020 07:58 ? Ref. Range 04/18/2016 08:40 09/26/2018 11:04  ?Uric Acid, Serum Latest Ref Range: 2.4 - 7.0 mg/dL 7.6 (H) 6.2  ? ? ?Social History  ? ?Tobacco Use  ?Smoking Status Former  ? Types: Cigarettes  ? Quit date: 05/23/1995  ? Years since quitting: 26.2  ?Smokeless Tobacco Never  ? ?BP Readings from Last 3 Encounters:  ?07/01/21 (!) 104/52  ?05/20/21 139/73  ?03/01/21 (!) 151/78  ? ?Pulse Readings from Last 3 Encounters:  ?07/01/21 67  ?05/20/21 77  ?03/01/21 67  ? ?Wt Readings from Last 3 Encounters:  ?05/20/21 127 lb 6.8 oz (57.8 kg)  ?02/10/21 127 lb 6.8 oz (57.8 kg)  ?01/21/21 136 lb (61.7 kg)  ? ? ?Assessment/Interventions: Review of patient past medical history, allergies, medications, health status, including review of consultants reports, laboratory and other test data, was performed as part of comprehensive evaluation and provision of chronic care management services.  ? ?SDOH:  (Social Determinants of Health) assessments and interventions performed: Yes ? ? ?[place] ? ?Allergies  ?Allergen Reactions  ? Pneumococcal Vaccines Swelling  ?  Per pt  when received 2015  ? Wound Dressing Adhesive Other (See Comments)  ?  Band-Aids will PULL OFF THE SKIN if left on for  a decent amount of time  ? Ampicillin Hives  ? Neomycin-Bacitracin Zn-Polymyx Rash  ? Penicillins Hives  ? ? ?Medications Reviewed Today   ? ? Reviewed by Barnetta Chapel, RN (Registered Nurse) on 06/30/21 at 2153  Med List Status: <None>  ? ?Medication Order Taking? Sig Documenting Provider Last Dose Status Informant  ?amLODipine (NORVASC) 5 MG tablet [id number]  Take 1 tablet (5 mg total) by mouth daily. Janith Lima, MD  Active   ?aspirin 81 MG EC tablet [id number]  Take 1 tablet (81 mg total) by mouth daily. Swallow whole. Janith Lima, MD  Active   ?citalopram (CELEXA) 10 MG tablet [id number]  Take 1 tablet (10 mg total) by mouth daily. Janith Lima, MD  Active   ?Memantine HCl-Donepezil HCl (NAMZARIC) 28-10 MG CP24 [id number]  Take 1 capsule at bedtime Janith Lima, MD  Active   ?propranolol (INDERAL) 20 MG tablet [id number]  Take 1 tablet (20 mg total) by mouth 2 (two) times daily. Janith Lima, MD  Active   ? ?  ?  ? ?  ? ? ?Patient Active Problem List  ? Diagnosis Date Noted  ? Dementia with behavioral problem 08/12/2021  ? Drug-induced liver injury   ? Insomnia, 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  ? Corns and callus 07/27/2020  ? Chronic renal disease, stage 3, moderately decreased glomerular filtration rate (GFR) between 30-59 mL/min/1.73 square meter (HCC) 09/26/2018  ? Senile dementia without behavioral disturbance ([place]) 03/21/2017  ? Atherosclerosis of native artery of both lower extremities with intermittent claudication ([place]) 11/24/2015  ? Cervical stenosis of spinal canal 12/22/2014  ? Visit for screening mammogram 09/16/2014  ? Peripheral neuropathy ([place]) 08/23/2010  ?  Class: Diagnosis of  ? DM (diabetes mellitus) type II controlled, neurological manifestation ([place]) 07/21/2010  ? Carotid artery stenosis 12/15/2009  ? DEGENERATIVE DISC DISEASE, LUMBOSACRAL SPINE  W/RADICULOPATHY 12/29/2008  ? Dyslipidemia, goal LDL below 70 02/16/2007  ? Gout 02/16/2007  ? Essential hypertension 02/16/2007  ? ? ?Immunization History  ?Administered Date(s) Administered  ? Fluad Quad(high Dose 65+) 03/

## 2021-08-16 DIAGNOSIS — S065X0A Traumatic subdural hemorrhage without loss of consciousness, initial encounter: Secondary | ICD-10-CM | POA: Diagnosis not present

## 2021-08-16 DIAGNOSIS — M4802 Spinal stenosis, cervical region: Secondary | ICD-10-CM | POA: Diagnosis not present

## 2021-08-16 DIAGNOSIS — R296 Repeated falls: Secondary | ICD-10-CM | POA: Diagnosis not present

## 2021-08-16 DIAGNOSIS — I6201 Nontraumatic acute subdural hemorrhage: Secondary | ICD-10-CM | POA: Diagnosis not present

## 2021-08-16 DIAGNOSIS — R262 Difficulty in walking, not elsewhere classified: Secondary | ICD-10-CM | POA: Diagnosis not present

## 2021-08-16 DIAGNOSIS — R2681 Unsteadiness on feet: Secondary | ICD-10-CM | POA: Diagnosis not present

## 2021-08-17 DIAGNOSIS — G301 Alzheimer's disease with late onset: Secondary | ICD-10-CM | POA: Diagnosis not present

## 2021-08-17 DIAGNOSIS — F02B3 Dementia in other diseases classified elsewhere, moderate, with mood disturbance: Secondary | ICD-10-CM | POA: Diagnosis not present

## 2021-08-17 DIAGNOSIS — R2681 Unsteadiness on feet: Secondary | ICD-10-CM | POA: Diagnosis not present

## 2021-08-17 DIAGNOSIS — I6201 Nontraumatic acute subdural hemorrhage: Secondary | ICD-10-CM | POA: Diagnosis not present

## 2021-08-17 DIAGNOSIS — R296 Repeated falls: Secondary | ICD-10-CM | POA: Diagnosis not present

## 2021-08-17 DIAGNOSIS — R262 Difficulty in walking, not elsewhere classified: Secondary | ICD-10-CM | POA: Diagnosis not present

## 2021-08-17 DIAGNOSIS — F331 Major depressive disorder, recurrent, moderate: Secondary | ICD-10-CM | POA: Diagnosis not present

## 2021-08-17 DIAGNOSIS — M4802 Spinal stenosis, cervical region: Secondary | ICD-10-CM | POA: Diagnosis not present

## 2021-08-17 DIAGNOSIS — K5901 Slow transit constipation: Secondary | ICD-10-CM | POA: Diagnosis not present

## 2021-08-17 DIAGNOSIS — I10 Essential (primary) hypertension: Secondary | ICD-10-CM | POA: Diagnosis not present

## 2021-08-17 DIAGNOSIS — S065X0A Traumatic subdural hemorrhage without loss of consciousness, initial encounter: Secondary | ICD-10-CM | POA: Diagnosis not present

## 2021-08-18 DIAGNOSIS — I6201 Nontraumatic acute subdural hemorrhage: Secondary | ICD-10-CM | POA: Diagnosis not present

## 2021-08-18 DIAGNOSIS — S065X0A Traumatic subdural hemorrhage without loss of consciousness, initial encounter: Secondary | ICD-10-CM | POA: Diagnosis not present

## 2021-08-18 DIAGNOSIS — R2681 Unsteadiness on feet: Secondary | ICD-10-CM | POA: Diagnosis not present

## 2021-08-18 DIAGNOSIS — R296 Repeated falls: Secondary | ICD-10-CM | POA: Diagnosis not present

## 2021-08-18 DIAGNOSIS — R262 Difficulty in walking, not elsewhere classified: Secondary | ICD-10-CM | POA: Diagnosis not present

## 2021-08-18 DIAGNOSIS — M4802 Spinal stenosis, cervical region: Secondary | ICD-10-CM | POA: Diagnosis not present

## 2021-08-20 DIAGNOSIS — R262 Difficulty in walking, not elsewhere classified: Secondary | ICD-10-CM | POA: Diagnosis not present

## 2021-08-20 DIAGNOSIS — R296 Repeated falls: Secondary | ICD-10-CM | POA: Diagnosis not present

## 2021-08-20 DIAGNOSIS — M6281 Muscle weakness (generalized): Secondary | ICD-10-CM | POA: Diagnosis not present

## 2021-08-20 DIAGNOSIS — R278 Other lack of coordination: Secondary | ICD-10-CM | POA: Diagnosis not present

## 2021-08-20 DIAGNOSIS — S065X0A Traumatic subdural hemorrhage without loss of consciousness, initial encounter: Secondary | ICD-10-CM | POA: Diagnosis not present

## 2021-08-20 DIAGNOSIS — R2681 Unsteadiness on feet: Secondary | ICD-10-CM | POA: Diagnosis not present

## 2021-08-20 DIAGNOSIS — I6201 Nontraumatic acute subdural hemorrhage: Secondary | ICD-10-CM | POA: Diagnosis not present

## 2021-08-20 DIAGNOSIS — M4802 Spinal stenosis, cervical region: Secondary | ICD-10-CM | POA: Diagnosis not present

## 2021-08-20 DIAGNOSIS — R2689 Other abnormalities of gait and mobility: Secondary | ICD-10-CM | POA: Diagnosis not present

## 2021-08-22 DIAGNOSIS — R262 Difficulty in walking, not elsewhere classified: Secondary | ICD-10-CM | POA: Diagnosis not present

## 2021-08-22 DIAGNOSIS — R296 Repeated falls: Secondary | ICD-10-CM | POA: Diagnosis not present

## 2021-08-22 DIAGNOSIS — R2681 Unsteadiness on feet: Secondary | ICD-10-CM | POA: Diagnosis not present

## 2021-08-22 DIAGNOSIS — I6201 Nontraumatic acute subdural hemorrhage: Secondary | ICD-10-CM | POA: Diagnosis not present

## 2021-08-22 DIAGNOSIS — S065X0A Traumatic subdural hemorrhage without loss of consciousness, initial encounter: Secondary | ICD-10-CM | POA: Diagnosis not present

## 2021-08-22 DIAGNOSIS — R2689 Other abnormalities of gait and mobility: Secondary | ICD-10-CM | POA: Diagnosis not present

## 2021-08-23 DIAGNOSIS — R2681 Unsteadiness on feet: Secondary | ICD-10-CM | POA: Diagnosis not present

## 2021-08-23 DIAGNOSIS — S065X0A Traumatic subdural hemorrhage without loss of consciousness, initial encounter: Secondary | ICD-10-CM | POA: Diagnosis not present

## 2021-08-23 DIAGNOSIS — R262 Difficulty in walking, not elsewhere classified: Secondary | ICD-10-CM | POA: Diagnosis not present

## 2021-08-23 DIAGNOSIS — R2689 Other abnormalities of gait and mobility: Secondary | ICD-10-CM | POA: Diagnosis not present

## 2021-08-23 DIAGNOSIS — I6201 Nontraumatic acute subdural hemorrhage: Secondary | ICD-10-CM | POA: Diagnosis not present

## 2021-08-23 DIAGNOSIS — R296 Repeated falls: Secondary | ICD-10-CM | POA: Diagnosis not present

## 2021-08-24 DIAGNOSIS — R2681 Unsteadiness on feet: Secondary | ICD-10-CM | POA: Diagnosis not present

## 2021-08-24 DIAGNOSIS — F02B3 Dementia in other diseases classified elsewhere, moderate, with mood disturbance: Secondary | ICD-10-CM | POA: Diagnosis not present

## 2021-08-24 DIAGNOSIS — G301 Alzheimer's disease with late onset: Secondary | ICD-10-CM | POA: Diagnosis not present

## 2021-08-24 DIAGNOSIS — F331 Major depressive disorder, recurrent, moderate: Secondary | ICD-10-CM | POA: Diagnosis not present

## 2021-08-24 DIAGNOSIS — R296 Repeated falls: Secondary | ICD-10-CM | POA: Diagnosis not present

## 2021-08-24 DIAGNOSIS — R262 Difficulty in walking, not elsewhere classified: Secondary | ICD-10-CM | POA: Diagnosis not present

## 2021-08-24 DIAGNOSIS — S065X0A Traumatic subdural hemorrhage without loss of consciousness, initial encounter: Secondary | ICD-10-CM | POA: Diagnosis not present

## 2021-08-24 DIAGNOSIS — I6201 Nontraumatic acute subdural hemorrhage: Secondary | ICD-10-CM | POA: Diagnosis not present

## 2021-08-24 DIAGNOSIS — I10 Essential (primary) hypertension: Secondary | ICD-10-CM | POA: Diagnosis not present

## 2021-08-24 DIAGNOSIS — R2689 Other abnormalities of gait and mobility: Secondary | ICD-10-CM | POA: Diagnosis not present

## 2021-08-25 DIAGNOSIS — R2681 Unsteadiness on feet: Secondary | ICD-10-CM | POA: Diagnosis not present

## 2021-08-25 DIAGNOSIS — R262 Difficulty in walking, not elsewhere classified: Secondary | ICD-10-CM | POA: Diagnosis not present

## 2021-08-25 DIAGNOSIS — S065X0A Traumatic subdural hemorrhage without loss of consciousness, initial encounter: Secondary | ICD-10-CM | POA: Diagnosis not present

## 2021-08-25 DIAGNOSIS — R296 Repeated falls: Secondary | ICD-10-CM | POA: Diagnosis not present

## 2021-08-25 DIAGNOSIS — I6201 Nontraumatic acute subdural hemorrhage: Secondary | ICD-10-CM | POA: Diagnosis not present

## 2021-08-25 DIAGNOSIS — R2689 Other abnormalities of gait and mobility: Secondary | ICD-10-CM | POA: Diagnosis not present

## 2021-08-25 DIAGNOSIS — N390 Urinary tract infection, site not specified: Secondary | ICD-10-CM | POA: Diagnosis not present

## 2021-08-26 DIAGNOSIS — I6201 Nontraumatic acute subdural hemorrhage: Secondary | ICD-10-CM | POA: Diagnosis not present

## 2021-08-26 DIAGNOSIS — R2681 Unsteadiness on feet: Secondary | ICD-10-CM | POA: Diagnosis not present

## 2021-08-26 DIAGNOSIS — R2689 Other abnormalities of gait and mobility: Secondary | ICD-10-CM | POA: Diagnosis not present

## 2021-08-26 DIAGNOSIS — S065X0A Traumatic subdural hemorrhage without loss of consciousness, initial encounter: Secondary | ICD-10-CM | POA: Diagnosis not present

## 2021-08-26 DIAGNOSIS — R296 Repeated falls: Secondary | ICD-10-CM | POA: Diagnosis not present

## 2021-08-26 DIAGNOSIS — R262 Difficulty in walking, not elsewhere classified: Secondary | ICD-10-CM | POA: Diagnosis not present

## 2021-08-29 DIAGNOSIS — S065X0A Traumatic subdural hemorrhage without loss of consciousness, initial encounter: Secondary | ICD-10-CM | POA: Diagnosis not present

## 2021-08-29 DIAGNOSIS — R262 Difficulty in walking, not elsewhere classified: Secondary | ICD-10-CM | POA: Diagnosis not present

## 2021-08-29 DIAGNOSIS — I6201 Nontraumatic acute subdural hemorrhage: Secondary | ICD-10-CM | POA: Diagnosis not present

## 2021-08-29 DIAGNOSIS — R296 Repeated falls: Secondary | ICD-10-CM | POA: Diagnosis not present

## 2021-08-29 DIAGNOSIS — R2681 Unsteadiness on feet: Secondary | ICD-10-CM | POA: Diagnosis not present

## 2021-08-29 DIAGNOSIS — R2689 Other abnormalities of gait and mobility: Secondary | ICD-10-CM | POA: Diagnosis not present

## 2021-08-30 DIAGNOSIS — R2689 Other abnormalities of gait and mobility: Secondary | ICD-10-CM | POA: Diagnosis not present

## 2021-08-30 DIAGNOSIS — R2681 Unsteadiness on feet: Secondary | ICD-10-CM | POA: Diagnosis not present

## 2021-08-30 DIAGNOSIS — I6201 Nontraumatic acute subdural hemorrhage: Secondary | ICD-10-CM | POA: Diagnosis not present

## 2021-08-30 DIAGNOSIS — R262 Difficulty in walking, not elsewhere classified: Secondary | ICD-10-CM | POA: Diagnosis not present

## 2021-08-30 DIAGNOSIS — R296 Repeated falls: Secondary | ICD-10-CM | POA: Diagnosis not present

## 2021-08-30 DIAGNOSIS — S065X0A Traumatic subdural hemorrhage without loss of consciousness, initial encounter: Secondary | ICD-10-CM | POA: Diagnosis not present

## 2021-08-31 DIAGNOSIS — S065X0A Traumatic subdural hemorrhage without loss of consciousness, initial encounter: Secondary | ICD-10-CM | POA: Diagnosis not present

## 2021-08-31 DIAGNOSIS — R2681 Unsteadiness on feet: Secondary | ICD-10-CM | POA: Diagnosis not present

## 2021-08-31 DIAGNOSIS — R262 Difficulty in walking, not elsewhere classified: Secondary | ICD-10-CM | POA: Diagnosis not present

## 2021-08-31 DIAGNOSIS — I6201 Nontraumatic acute subdural hemorrhage: Secondary | ICD-10-CM | POA: Diagnosis not present

## 2021-08-31 DIAGNOSIS — R2689 Other abnormalities of gait and mobility: Secondary | ICD-10-CM | POA: Diagnosis not present

## 2021-08-31 DIAGNOSIS — R296 Repeated falls: Secondary | ICD-10-CM | POA: Diagnosis not present

## 2021-09-01 DIAGNOSIS — R262 Difficulty in walking, not elsewhere classified: Secondary | ICD-10-CM | POA: Diagnosis not present

## 2021-09-01 DIAGNOSIS — I6201 Nontraumatic acute subdural hemorrhage: Secondary | ICD-10-CM | POA: Diagnosis not present

## 2021-09-01 DIAGNOSIS — R2681 Unsteadiness on feet: Secondary | ICD-10-CM | POA: Diagnosis not present

## 2021-09-01 DIAGNOSIS — R296 Repeated falls: Secondary | ICD-10-CM | POA: Diagnosis not present

## 2021-09-01 DIAGNOSIS — R2689 Other abnormalities of gait and mobility: Secondary | ICD-10-CM | POA: Diagnosis not present

## 2021-09-01 DIAGNOSIS — S065X0A Traumatic subdural hemorrhage without loss of consciousness, initial encounter: Secondary | ICD-10-CM | POA: Diagnosis not present

## 2021-09-02 DIAGNOSIS — S065X0A Traumatic subdural hemorrhage without loss of consciousness, initial encounter: Secondary | ICD-10-CM | POA: Diagnosis not present

## 2021-09-02 DIAGNOSIS — R296 Repeated falls: Secondary | ICD-10-CM | POA: Diagnosis not present

## 2021-09-02 DIAGNOSIS — R2689 Other abnormalities of gait and mobility: Secondary | ICD-10-CM | POA: Diagnosis not present

## 2021-09-02 DIAGNOSIS — R262 Difficulty in walking, not elsewhere classified: Secondary | ICD-10-CM | POA: Diagnosis not present

## 2021-09-02 DIAGNOSIS — I6201 Nontraumatic acute subdural hemorrhage: Secondary | ICD-10-CM | POA: Diagnosis not present

## 2021-09-02 DIAGNOSIS — R2681 Unsteadiness on feet: Secondary | ICD-10-CM | POA: Diagnosis not present

## 2021-09-05 DIAGNOSIS — R296 Repeated falls: Secondary | ICD-10-CM | POA: Diagnosis not present

## 2021-09-05 DIAGNOSIS — R2689 Other abnormalities of gait and mobility: Secondary | ICD-10-CM | POA: Diagnosis not present

## 2021-09-05 DIAGNOSIS — R262 Difficulty in walking, not elsewhere classified: Secondary | ICD-10-CM | POA: Diagnosis not present

## 2021-09-05 DIAGNOSIS — I6201 Nontraumatic acute subdural hemorrhage: Secondary | ICD-10-CM | POA: Diagnosis not present

## 2021-09-05 DIAGNOSIS — R2681 Unsteadiness on feet: Secondary | ICD-10-CM | POA: Diagnosis not present

## 2021-09-05 DIAGNOSIS — S065X0A Traumatic subdural hemorrhage without loss of consciousness, initial encounter: Secondary | ICD-10-CM | POA: Diagnosis not present

## 2021-09-06 DIAGNOSIS — R262 Difficulty in walking, not elsewhere classified: Secondary | ICD-10-CM | POA: Diagnosis not present

## 2021-09-06 DIAGNOSIS — S065X0A Traumatic subdural hemorrhage without loss of consciousness, initial encounter: Secondary | ICD-10-CM | POA: Diagnosis not present

## 2021-09-06 DIAGNOSIS — R2689 Other abnormalities of gait and mobility: Secondary | ICD-10-CM | POA: Diagnosis not present

## 2021-09-06 DIAGNOSIS — R2681 Unsteadiness on feet: Secondary | ICD-10-CM | POA: Diagnosis not present

## 2021-09-06 DIAGNOSIS — I6201 Nontraumatic acute subdural hemorrhage: Secondary | ICD-10-CM | POA: Diagnosis not present

## 2021-09-06 DIAGNOSIS — R296 Repeated falls: Secondary | ICD-10-CM | POA: Diagnosis not present

## 2021-09-07 DIAGNOSIS — S065X0A Traumatic subdural hemorrhage without loss of consciousness, initial encounter: Secondary | ICD-10-CM | POA: Diagnosis not present

## 2021-09-07 DIAGNOSIS — I6201 Nontraumatic acute subdural hemorrhage: Secondary | ICD-10-CM | POA: Diagnosis not present

## 2021-09-07 DIAGNOSIS — R2689 Other abnormalities of gait and mobility: Secondary | ICD-10-CM | POA: Diagnosis not present

## 2021-09-07 DIAGNOSIS — R262 Difficulty in walking, not elsewhere classified: Secondary | ICD-10-CM | POA: Diagnosis not present

## 2021-09-07 DIAGNOSIS — R296 Repeated falls: Secondary | ICD-10-CM | POA: Diagnosis not present

## 2021-09-07 DIAGNOSIS — R2681 Unsteadiness on feet: Secondary | ICD-10-CM | POA: Diagnosis not present

## 2021-09-08 DIAGNOSIS — R296 Repeated falls: Secondary | ICD-10-CM | POA: Diagnosis not present

## 2021-09-08 DIAGNOSIS — I6201 Nontraumatic acute subdural hemorrhage: Secondary | ICD-10-CM | POA: Diagnosis not present

## 2021-09-08 DIAGNOSIS — R2681 Unsteadiness on feet: Secondary | ICD-10-CM | POA: Diagnosis not present

## 2021-09-08 DIAGNOSIS — R262 Difficulty in walking, not elsewhere classified: Secondary | ICD-10-CM | POA: Diagnosis not present

## 2021-09-08 DIAGNOSIS — S065X0A Traumatic subdural hemorrhage without loss of consciousness, initial encounter: Secondary | ICD-10-CM | POA: Diagnosis not present

## 2021-09-08 DIAGNOSIS — R2689 Other abnormalities of gait and mobility: Secondary | ICD-10-CM | POA: Diagnosis not present

## 2021-09-09 DIAGNOSIS — R262 Difficulty in walking, not elsewhere classified: Secondary | ICD-10-CM | POA: Diagnosis not present

## 2021-09-09 DIAGNOSIS — R2689 Other abnormalities of gait and mobility: Secondary | ICD-10-CM | POA: Diagnosis not present

## 2021-09-09 DIAGNOSIS — S065X0A Traumatic subdural hemorrhage without loss of consciousness, initial encounter: Secondary | ICD-10-CM | POA: Diagnosis not present

## 2021-09-09 DIAGNOSIS — R2681 Unsteadiness on feet: Secondary | ICD-10-CM | POA: Diagnosis not present

## 2021-09-09 DIAGNOSIS — I6201 Nontraumatic acute subdural hemorrhage: Secondary | ICD-10-CM | POA: Diagnosis not present

## 2021-09-09 DIAGNOSIS — R296 Repeated falls: Secondary | ICD-10-CM | POA: Diagnosis not present

## 2021-09-12 DIAGNOSIS — R262 Difficulty in walking, not elsewhere classified: Secondary | ICD-10-CM | POA: Diagnosis not present

## 2021-09-12 DIAGNOSIS — R2681 Unsteadiness on feet: Secondary | ICD-10-CM | POA: Diagnosis not present

## 2021-09-12 DIAGNOSIS — R296 Repeated falls: Secondary | ICD-10-CM | POA: Diagnosis not present

## 2021-09-12 DIAGNOSIS — S065X0A Traumatic subdural hemorrhage without loss of consciousness, initial encounter: Secondary | ICD-10-CM | POA: Diagnosis not present

## 2021-09-12 DIAGNOSIS — I6201 Nontraumatic acute subdural hemorrhage: Secondary | ICD-10-CM | POA: Diagnosis not present

## 2021-09-12 DIAGNOSIS — R2689 Other abnormalities of gait and mobility: Secondary | ICD-10-CM | POA: Diagnosis not present

## 2021-09-13 DIAGNOSIS — I6201 Nontraumatic acute subdural hemorrhage: Secondary | ICD-10-CM | POA: Diagnosis not present

## 2021-09-13 DIAGNOSIS — S065X0A Traumatic subdural hemorrhage without loss of consciousness, initial encounter: Secondary | ICD-10-CM | POA: Diagnosis not present

## 2021-09-13 DIAGNOSIS — R2681 Unsteadiness on feet: Secondary | ICD-10-CM | POA: Diagnosis not present

## 2021-09-13 DIAGNOSIS — R296 Repeated falls: Secondary | ICD-10-CM | POA: Diagnosis not present

## 2021-09-13 DIAGNOSIS — R2689 Other abnormalities of gait and mobility: Secondary | ICD-10-CM | POA: Diagnosis not present

## 2021-09-13 DIAGNOSIS — R262 Difficulty in walking, not elsewhere classified: Secondary | ICD-10-CM | POA: Diagnosis not present

## 2021-09-14 DIAGNOSIS — R2689 Other abnormalities of gait and mobility: Secondary | ICD-10-CM | POA: Diagnosis not present

## 2021-09-14 DIAGNOSIS — R262 Difficulty in walking, not elsewhere classified: Secondary | ICD-10-CM | POA: Diagnosis not present

## 2021-09-14 DIAGNOSIS — I6201 Nontraumatic acute subdural hemorrhage: Secondary | ICD-10-CM | POA: Diagnosis not present

## 2021-09-14 DIAGNOSIS — M150 Primary generalized (osteo)arthritis: Secondary | ICD-10-CM | POA: Diagnosis not present

## 2021-09-14 DIAGNOSIS — R296 Repeated falls: Secondary | ICD-10-CM | POA: Diagnosis not present

## 2021-09-14 DIAGNOSIS — S065X0A Traumatic subdural hemorrhage without loss of consciousness, initial encounter: Secondary | ICD-10-CM | POA: Diagnosis not present

## 2021-09-14 DIAGNOSIS — G301 Alzheimer's disease with late onset: Secondary | ICD-10-CM | POA: Diagnosis not present

## 2021-09-14 DIAGNOSIS — I10 Essential (primary) hypertension: Secondary | ICD-10-CM | POA: Diagnosis not present

## 2021-09-14 DIAGNOSIS — F02B3 Dementia in other diseases classified elsewhere, moderate, with mood disturbance: Secondary | ICD-10-CM | POA: Diagnosis not present

## 2021-09-14 DIAGNOSIS — R2681 Unsteadiness on feet: Secondary | ICD-10-CM | POA: Diagnosis not present

## 2021-09-15 DIAGNOSIS — R262 Difficulty in walking, not elsewhere classified: Secondary | ICD-10-CM | POA: Diagnosis not present

## 2021-09-15 DIAGNOSIS — R296 Repeated falls: Secondary | ICD-10-CM | POA: Diagnosis not present

## 2021-09-15 DIAGNOSIS — R2689 Other abnormalities of gait and mobility: Secondary | ICD-10-CM | POA: Diagnosis not present

## 2021-09-15 DIAGNOSIS — S065X0A Traumatic subdural hemorrhage without loss of consciousness, initial encounter: Secondary | ICD-10-CM | POA: Diagnosis not present

## 2021-09-15 DIAGNOSIS — I6201 Nontraumatic acute subdural hemorrhage: Secondary | ICD-10-CM | POA: Diagnosis not present

## 2021-09-15 DIAGNOSIS — R2681 Unsteadiness on feet: Secondary | ICD-10-CM | POA: Diagnosis not present

## 2021-09-16 DIAGNOSIS — R262 Difficulty in walking, not elsewhere classified: Secondary | ICD-10-CM | POA: Diagnosis not present

## 2021-09-16 DIAGNOSIS — R2681 Unsteadiness on feet: Secondary | ICD-10-CM | POA: Diagnosis not present

## 2021-09-16 DIAGNOSIS — R296 Repeated falls: Secondary | ICD-10-CM | POA: Diagnosis not present

## 2021-09-16 DIAGNOSIS — I6201 Nontraumatic acute subdural hemorrhage: Secondary | ICD-10-CM | POA: Diagnosis not present

## 2021-09-16 DIAGNOSIS — S065X0A Traumatic subdural hemorrhage without loss of consciousness, initial encounter: Secondary | ICD-10-CM | POA: Diagnosis not present

## 2021-09-16 DIAGNOSIS — R2689 Other abnormalities of gait and mobility: Secondary | ICD-10-CM | POA: Diagnosis not present

## 2021-09-19 DIAGNOSIS — R2689 Other abnormalities of gait and mobility: Secondary | ICD-10-CM | POA: Diagnosis not present

## 2021-09-19 DIAGNOSIS — S065X0A Traumatic subdural hemorrhage without loss of consciousness, initial encounter: Secondary | ICD-10-CM | POA: Diagnosis not present

## 2021-09-19 DIAGNOSIS — M6281 Muscle weakness (generalized): Secondary | ICD-10-CM | POA: Diagnosis not present

## 2021-09-19 DIAGNOSIS — R2681 Unsteadiness on feet: Secondary | ICD-10-CM | POA: Diagnosis not present

## 2021-09-19 DIAGNOSIS — R262 Difficulty in walking, not elsewhere classified: Secondary | ICD-10-CM | POA: Diagnosis not present

## 2021-09-19 DIAGNOSIS — M4802 Spinal stenosis, cervical region: Secondary | ICD-10-CM | POA: Diagnosis not present

## 2021-09-19 DIAGNOSIS — R296 Repeated falls: Secondary | ICD-10-CM | POA: Diagnosis not present

## 2021-09-19 DIAGNOSIS — I6201 Nontraumatic acute subdural hemorrhage: Secondary | ICD-10-CM | POA: Diagnosis not present

## 2021-09-19 DIAGNOSIS — R278 Other lack of coordination: Secondary | ICD-10-CM | POA: Diagnosis not present

## 2021-09-20 DIAGNOSIS — I6201 Nontraumatic acute subdural hemorrhage: Secondary | ICD-10-CM | POA: Diagnosis not present

## 2021-09-20 DIAGNOSIS — S065X0A Traumatic subdural hemorrhage without loss of consciousness, initial encounter: Secondary | ICD-10-CM | POA: Diagnosis not present

## 2021-09-20 DIAGNOSIS — R296 Repeated falls: Secondary | ICD-10-CM | POA: Diagnosis not present

## 2021-09-20 DIAGNOSIS — R2681 Unsteadiness on feet: Secondary | ICD-10-CM | POA: Diagnosis not present

## 2021-09-20 DIAGNOSIS — R2689 Other abnormalities of gait and mobility: Secondary | ICD-10-CM | POA: Diagnosis not present

## 2021-09-20 DIAGNOSIS — R262 Difficulty in walking, not elsewhere classified: Secondary | ICD-10-CM | POA: Diagnosis not present

## 2021-09-21 DIAGNOSIS — R2689 Other abnormalities of gait and mobility: Secondary | ICD-10-CM | POA: Diagnosis not present

## 2021-09-21 DIAGNOSIS — I6201 Nontraumatic acute subdural hemorrhage: Secondary | ICD-10-CM | POA: Diagnosis not present

## 2021-09-21 DIAGNOSIS — R262 Difficulty in walking, not elsewhere classified: Secondary | ICD-10-CM | POA: Diagnosis not present

## 2021-09-21 DIAGNOSIS — S065X0A Traumatic subdural hemorrhage without loss of consciousness, initial encounter: Secondary | ICD-10-CM | POA: Diagnosis not present

## 2021-09-21 DIAGNOSIS — R296 Repeated falls: Secondary | ICD-10-CM | POA: Diagnosis not present

## 2021-09-21 DIAGNOSIS — R2681 Unsteadiness on feet: Secondary | ICD-10-CM | POA: Diagnosis not present

## 2021-09-22 DIAGNOSIS — R2689 Other abnormalities of gait and mobility: Secondary | ICD-10-CM | POA: Diagnosis not present

## 2021-09-22 DIAGNOSIS — S065X0A Traumatic subdural hemorrhage without loss of consciousness, initial encounter: Secondary | ICD-10-CM | POA: Diagnosis not present

## 2021-09-22 DIAGNOSIS — I6201 Nontraumatic acute subdural hemorrhage: Secondary | ICD-10-CM | POA: Diagnosis not present

## 2021-09-22 DIAGNOSIS — R296 Repeated falls: Secondary | ICD-10-CM | POA: Diagnosis not present

## 2021-09-22 DIAGNOSIS — R262 Difficulty in walking, not elsewhere classified: Secondary | ICD-10-CM | POA: Diagnosis not present

## 2021-09-22 DIAGNOSIS — R2681 Unsteadiness on feet: Secondary | ICD-10-CM | POA: Diagnosis not present

## 2021-09-23 DIAGNOSIS — S065X0A Traumatic subdural hemorrhage without loss of consciousness, initial encounter: Secondary | ICD-10-CM | POA: Diagnosis not present

## 2021-09-23 DIAGNOSIS — R2681 Unsteadiness on feet: Secondary | ICD-10-CM | POA: Diagnosis not present

## 2021-09-23 DIAGNOSIS — I6201 Nontraumatic acute subdural hemorrhage: Secondary | ICD-10-CM | POA: Diagnosis not present

## 2021-09-23 DIAGNOSIS — R296 Repeated falls: Secondary | ICD-10-CM | POA: Diagnosis not present

## 2021-09-23 DIAGNOSIS — R2689 Other abnormalities of gait and mobility: Secondary | ICD-10-CM | POA: Diagnosis not present

## 2021-09-23 DIAGNOSIS — R262 Difficulty in walking, not elsewhere classified: Secondary | ICD-10-CM | POA: Diagnosis not present

## 2021-09-26 DIAGNOSIS — R2689 Other abnormalities of gait and mobility: Secondary | ICD-10-CM | POA: Diagnosis not present

## 2021-09-26 DIAGNOSIS — R296 Repeated falls: Secondary | ICD-10-CM | POA: Diagnosis not present

## 2021-09-26 DIAGNOSIS — I6201 Nontraumatic acute subdural hemorrhage: Secondary | ICD-10-CM | POA: Diagnosis not present

## 2021-09-26 DIAGNOSIS — R2681 Unsteadiness on feet: Secondary | ICD-10-CM | POA: Diagnosis not present

## 2021-09-26 DIAGNOSIS — S065X0A Traumatic subdural hemorrhage without loss of consciousness, initial encounter: Secondary | ICD-10-CM | POA: Diagnosis not present

## 2021-09-26 DIAGNOSIS — R262 Difficulty in walking, not elsewhere classified: Secondary | ICD-10-CM | POA: Diagnosis not present

## 2021-09-27 DIAGNOSIS — R262 Difficulty in walking, not elsewhere classified: Secondary | ICD-10-CM | POA: Diagnosis not present

## 2021-09-27 DIAGNOSIS — S065X0A Traumatic subdural hemorrhage without loss of consciousness, initial encounter: Secondary | ICD-10-CM | POA: Diagnosis not present

## 2021-09-27 DIAGNOSIS — R296 Repeated falls: Secondary | ICD-10-CM | POA: Diagnosis not present

## 2021-09-27 DIAGNOSIS — I6201 Nontraumatic acute subdural hemorrhage: Secondary | ICD-10-CM | POA: Diagnosis not present

## 2021-09-27 DIAGNOSIS — R2681 Unsteadiness on feet: Secondary | ICD-10-CM | POA: Diagnosis not present

## 2021-09-27 DIAGNOSIS — R2689 Other abnormalities of gait and mobility: Secondary | ICD-10-CM | POA: Diagnosis not present

## 2021-09-28 DIAGNOSIS — R2689 Other abnormalities of gait and mobility: Secondary | ICD-10-CM | POA: Diagnosis not present

## 2021-09-28 DIAGNOSIS — I6201 Nontraumatic acute subdural hemorrhage: Secondary | ICD-10-CM | POA: Diagnosis not present

## 2021-09-28 DIAGNOSIS — S065X0A Traumatic subdural hemorrhage without loss of consciousness, initial encounter: Secondary | ICD-10-CM | POA: Diagnosis not present

## 2021-09-28 DIAGNOSIS — R296 Repeated falls: Secondary | ICD-10-CM | POA: Diagnosis not present

## 2021-09-28 DIAGNOSIS — R262 Difficulty in walking, not elsewhere classified: Secondary | ICD-10-CM | POA: Diagnosis not present

## 2021-09-28 DIAGNOSIS — R2681 Unsteadiness on feet: Secondary | ICD-10-CM | POA: Diagnosis not present

## 2021-09-29 DIAGNOSIS — R296 Repeated falls: Secondary | ICD-10-CM | POA: Diagnosis not present

## 2021-09-29 DIAGNOSIS — R2681 Unsteadiness on feet: Secondary | ICD-10-CM | POA: Diagnosis not present

## 2021-09-29 DIAGNOSIS — R262 Difficulty in walking, not elsewhere classified: Secondary | ICD-10-CM | POA: Diagnosis not present

## 2021-09-29 DIAGNOSIS — I6201 Nontraumatic acute subdural hemorrhage: Secondary | ICD-10-CM | POA: Diagnosis not present

## 2021-09-29 DIAGNOSIS — S065X0A Traumatic subdural hemorrhage without loss of consciousness, initial encounter: Secondary | ICD-10-CM | POA: Diagnosis not present

## 2021-09-29 DIAGNOSIS — R2689 Other abnormalities of gait and mobility: Secondary | ICD-10-CM | POA: Diagnosis not present

## 2021-10-03 DIAGNOSIS — I6201 Nontraumatic acute subdural hemorrhage: Secondary | ICD-10-CM | POA: Diagnosis not present

## 2021-10-03 DIAGNOSIS — R2681 Unsteadiness on feet: Secondary | ICD-10-CM | POA: Diagnosis not present

## 2021-10-03 DIAGNOSIS — R2689 Other abnormalities of gait and mobility: Secondary | ICD-10-CM | POA: Diagnosis not present

## 2021-10-03 DIAGNOSIS — S065X0A Traumatic subdural hemorrhage without loss of consciousness, initial encounter: Secondary | ICD-10-CM | POA: Diagnosis not present

## 2021-10-03 DIAGNOSIS — R262 Difficulty in walking, not elsewhere classified: Secondary | ICD-10-CM | POA: Diagnosis not present

## 2021-10-03 DIAGNOSIS — R296 Repeated falls: Secondary | ICD-10-CM | POA: Diagnosis not present

## 2021-10-05 DIAGNOSIS — S065X0A Traumatic subdural hemorrhage without loss of consciousness, initial encounter: Secondary | ICD-10-CM | POA: Diagnosis not present

## 2021-10-05 DIAGNOSIS — I6201 Nontraumatic acute subdural hemorrhage: Secondary | ICD-10-CM | POA: Diagnosis not present

## 2021-10-05 DIAGNOSIS — R2681 Unsteadiness on feet: Secondary | ICD-10-CM | POA: Diagnosis not present

## 2021-10-05 DIAGNOSIS — R262 Difficulty in walking, not elsewhere classified: Secondary | ICD-10-CM | POA: Diagnosis not present

## 2021-10-05 DIAGNOSIS — R296 Repeated falls: Secondary | ICD-10-CM | POA: Diagnosis not present

## 2021-10-05 DIAGNOSIS — R2689 Other abnormalities of gait and mobility: Secondary | ICD-10-CM | POA: Diagnosis not present

## 2021-10-06 DIAGNOSIS — I6201 Nontraumatic acute subdural hemorrhage: Secondary | ICD-10-CM | POA: Diagnosis not present

## 2021-10-06 DIAGNOSIS — S065X0A Traumatic subdural hemorrhage without loss of consciousness, initial encounter: Secondary | ICD-10-CM | POA: Diagnosis not present

## 2021-10-06 DIAGNOSIS — R262 Difficulty in walking, not elsewhere classified: Secondary | ICD-10-CM | POA: Diagnosis not present

## 2021-10-06 DIAGNOSIS — R296 Repeated falls: Secondary | ICD-10-CM | POA: Diagnosis not present

## 2021-10-06 DIAGNOSIS — R2681 Unsteadiness on feet: Secondary | ICD-10-CM | POA: Diagnosis not present

## 2021-10-06 DIAGNOSIS — R2689 Other abnormalities of gait and mobility: Secondary | ICD-10-CM | POA: Diagnosis not present

## 2021-10-07 DIAGNOSIS — R2681 Unsteadiness on feet: Secondary | ICD-10-CM | POA: Diagnosis not present

## 2021-10-07 DIAGNOSIS — R296 Repeated falls: Secondary | ICD-10-CM | POA: Diagnosis not present

## 2021-10-07 DIAGNOSIS — S065X0A Traumatic subdural hemorrhage without loss of consciousness, initial encounter: Secondary | ICD-10-CM | POA: Diagnosis not present

## 2021-10-07 DIAGNOSIS — R2689 Other abnormalities of gait and mobility: Secondary | ICD-10-CM | POA: Diagnosis not present

## 2021-10-07 DIAGNOSIS — I6201 Nontraumatic acute subdural hemorrhage: Secondary | ICD-10-CM | POA: Diagnosis not present

## 2021-10-07 DIAGNOSIS — R262 Difficulty in walking, not elsewhere classified: Secondary | ICD-10-CM | POA: Diagnosis not present

## 2021-10-11 DIAGNOSIS — R2689 Other abnormalities of gait and mobility: Secondary | ICD-10-CM | POA: Diagnosis not present

## 2021-10-11 DIAGNOSIS — S065X0A Traumatic subdural hemorrhage without loss of consciousness, initial encounter: Secondary | ICD-10-CM | POA: Diagnosis not present

## 2021-10-11 DIAGNOSIS — R262 Difficulty in walking, not elsewhere classified: Secondary | ICD-10-CM | POA: Diagnosis not present

## 2021-10-11 DIAGNOSIS — R296 Repeated falls: Secondary | ICD-10-CM | POA: Diagnosis not present

## 2021-10-11 DIAGNOSIS — R2681 Unsteadiness on feet: Secondary | ICD-10-CM | POA: Diagnosis not present

## 2021-10-11 DIAGNOSIS — I6201 Nontraumatic acute subdural hemorrhage: Secondary | ICD-10-CM | POA: Diagnosis not present

## 2021-10-12 DIAGNOSIS — I6201 Nontraumatic acute subdural hemorrhage: Secondary | ICD-10-CM | POA: Diagnosis not present

## 2021-10-12 DIAGNOSIS — F331 Major depressive disorder, recurrent, moderate: Secondary | ICD-10-CM | POA: Diagnosis not present

## 2021-10-12 DIAGNOSIS — R296 Repeated falls: Secondary | ICD-10-CM | POA: Diagnosis not present

## 2021-10-12 DIAGNOSIS — F339 Major depressive disorder, recurrent, unspecified: Secondary | ICD-10-CM | POA: Diagnosis not present

## 2021-10-12 DIAGNOSIS — R2681 Unsteadiness on feet: Secondary | ICD-10-CM | POA: Diagnosis not present

## 2021-10-12 DIAGNOSIS — I10 Essential (primary) hypertension: Secondary | ICD-10-CM | POA: Diagnosis not present

## 2021-10-12 DIAGNOSIS — S065X0A Traumatic subdural hemorrhage without loss of consciousness, initial encounter: Secondary | ICD-10-CM | POA: Diagnosis not present

## 2021-10-12 DIAGNOSIS — R2689 Other abnormalities of gait and mobility: Secondary | ICD-10-CM | POA: Diagnosis not present

## 2021-10-12 DIAGNOSIS — F02B3 Dementia in other diseases classified elsewhere, moderate, with mood disturbance: Secondary | ICD-10-CM | POA: Diagnosis not present

## 2021-10-12 DIAGNOSIS — R262 Difficulty in walking, not elsewhere classified: Secondary | ICD-10-CM | POA: Diagnosis not present

## 2021-10-12 DIAGNOSIS — Z79899 Other long term (current) drug therapy: Secondary | ICD-10-CM | POA: Diagnosis not present

## 2021-10-14 DIAGNOSIS — R2689 Other abnormalities of gait and mobility: Secondary | ICD-10-CM | POA: Diagnosis not present

## 2021-10-14 DIAGNOSIS — R296 Repeated falls: Secondary | ICD-10-CM | POA: Diagnosis not present

## 2021-10-14 DIAGNOSIS — R262 Difficulty in walking, not elsewhere classified: Secondary | ICD-10-CM | POA: Diagnosis not present

## 2021-10-14 DIAGNOSIS — I6201 Nontraumatic acute subdural hemorrhage: Secondary | ICD-10-CM | POA: Diagnosis not present

## 2021-10-14 DIAGNOSIS — S065X0A Traumatic subdural hemorrhage without loss of consciousness, initial encounter: Secondary | ICD-10-CM | POA: Diagnosis not present

## 2021-10-14 DIAGNOSIS — R2681 Unsteadiness on feet: Secondary | ICD-10-CM | POA: Diagnosis not present

## 2021-10-18 DIAGNOSIS — I6201 Nontraumatic acute subdural hemorrhage: Secondary | ICD-10-CM | POA: Diagnosis not present

## 2021-10-18 DIAGNOSIS — S065X0A Traumatic subdural hemorrhage without loss of consciousness, initial encounter: Secondary | ICD-10-CM | POA: Diagnosis not present

## 2021-10-18 DIAGNOSIS — R2689 Other abnormalities of gait and mobility: Secondary | ICD-10-CM | POA: Diagnosis not present

## 2021-10-18 DIAGNOSIS — R296 Repeated falls: Secondary | ICD-10-CM | POA: Diagnosis not present

## 2021-10-18 DIAGNOSIS — R2681 Unsteadiness on feet: Secondary | ICD-10-CM | POA: Diagnosis not present

## 2021-10-18 DIAGNOSIS — R262 Difficulty in walking, not elsewhere classified: Secondary | ICD-10-CM | POA: Diagnosis not present

## 2021-10-19 DIAGNOSIS — R296 Repeated falls: Secondary | ICD-10-CM | POA: Diagnosis not present

## 2021-10-19 DIAGNOSIS — R262 Difficulty in walking, not elsewhere classified: Secondary | ICD-10-CM | POA: Diagnosis not present

## 2021-10-19 DIAGNOSIS — S065X0A Traumatic subdural hemorrhage without loss of consciousness, initial encounter: Secondary | ICD-10-CM | POA: Diagnosis not present

## 2021-10-19 DIAGNOSIS — R2689 Other abnormalities of gait and mobility: Secondary | ICD-10-CM | POA: Diagnosis not present

## 2021-10-19 DIAGNOSIS — I6201 Nontraumatic acute subdural hemorrhage: Secondary | ICD-10-CM | POA: Diagnosis not present

## 2021-10-19 DIAGNOSIS — R2681 Unsteadiness on feet: Secondary | ICD-10-CM | POA: Diagnosis not present

## 2021-10-21 DIAGNOSIS — R2689 Other abnormalities of gait and mobility: Secondary | ICD-10-CM | POA: Diagnosis not present

## 2021-10-21 DIAGNOSIS — R2681 Unsteadiness on feet: Secondary | ICD-10-CM | POA: Diagnosis not present

## 2021-10-21 DIAGNOSIS — S065X0A Traumatic subdural hemorrhage without loss of consciousness, initial encounter: Secondary | ICD-10-CM | POA: Diagnosis not present

## 2021-10-21 DIAGNOSIS — R262 Difficulty in walking, not elsewhere classified: Secondary | ICD-10-CM | POA: Diagnosis not present

## 2021-10-24 DIAGNOSIS — S065X0A Traumatic subdural hemorrhage without loss of consciousness, initial encounter: Secondary | ICD-10-CM | POA: Diagnosis not present

## 2021-10-24 DIAGNOSIS — R2681 Unsteadiness on feet: Secondary | ICD-10-CM | POA: Diagnosis not present

## 2021-10-24 DIAGNOSIS — R262 Difficulty in walking, not elsewhere classified: Secondary | ICD-10-CM | POA: Diagnosis not present

## 2021-10-24 DIAGNOSIS — R2689 Other abnormalities of gait and mobility: Secondary | ICD-10-CM | POA: Diagnosis not present

## 2021-10-26 DIAGNOSIS — S065X0A Traumatic subdural hemorrhage without loss of consciousness, initial encounter: Secondary | ICD-10-CM | POA: Diagnosis not present

## 2021-10-26 DIAGNOSIS — R262 Difficulty in walking, not elsewhere classified: Secondary | ICD-10-CM | POA: Diagnosis not present

## 2021-10-26 DIAGNOSIS — R2681 Unsteadiness on feet: Secondary | ICD-10-CM | POA: Diagnosis not present

## 2021-10-26 DIAGNOSIS — R2689 Other abnormalities of gait and mobility: Secondary | ICD-10-CM | POA: Diagnosis not present

## 2021-10-28 DIAGNOSIS — R2681 Unsteadiness on feet: Secondary | ICD-10-CM | POA: Diagnosis not present

## 2021-10-28 DIAGNOSIS — R2689 Other abnormalities of gait and mobility: Secondary | ICD-10-CM | POA: Diagnosis not present

## 2021-10-28 DIAGNOSIS — R262 Difficulty in walking, not elsewhere classified: Secondary | ICD-10-CM | POA: Diagnosis not present

## 2021-10-28 DIAGNOSIS — S065X0A Traumatic subdural hemorrhage without loss of consciousness, initial encounter: Secondary | ICD-10-CM | POA: Diagnosis not present

## 2021-10-31 DIAGNOSIS — R262 Difficulty in walking, not elsewhere classified: Secondary | ICD-10-CM | POA: Diagnosis not present

## 2021-10-31 DIAGNOSIS — S065X0A Traumatic subdural hemorrhage without loss of consciousness, initial encounter: Secondary | ICD-10-CM | POA: Diagnosis not present

## 2021-10-31 DIAGNOSIS — R2689 Other abnormalities of gait and mobility: Secondary | ICD-10-CM | POA: Diagnosis not present

## 2021-10-31 DIAGNOSIS — R2681 Unsteadiness on feet: Secondary | ICD-10-CM | POA: Diagnosis not present

## 2021-11-02 DIAGNOSIS — R262 Difficulty in walking, not elsewhere classified: Secondary | ICD-10-CM | POA: Diagnosis not present

## 2021-11-02 DIAGNOSIS — R2681 Unsteadiness on feet: Secondary | ICD-10-CM | POA: Diagnosis not present

## 2021-11-02 DIAGNOSIS — S065X0A Traumatic subdural hemorrhage without loss of consciousness, initial encounter: Secondary | ICD-10-CM | POA: Diagnosis not present

## 2021-11-02 DIAGNOSIS — R2689 Other abnormalities of gait and mobility: Secondary | ICD-10-CM | POA: Diagnosis not present

## 2021-11-04 DIAGNOSIS — K5901 Slow transit constipation: Secondary | ICD-10-CM | POA: Diagnosis not present

## 2021-11-04 DIAGNOSIS — G301 Alzheimer's disease with late onset: Secondary | ICD-10-CM | POA: Diagnosis not present

## 2021-11-04 DIAGNOSIS — N183 Chronic kidney disease, stage 3 unspecified: Secondary | ICD-10-CM | POA: Diagnosis not present

## 2021-11-04 DIAGNOSIS — I129 Hypertensive chronic kidney disease with stage 1 through stage 4 chronic kidney disease, or unspecified chronic kidney disease: Secondary | ICD-10-CM | POA: Diagnosis not present

## 2021-11-04 DIAGNOSIS — F0280 Dementia in other diseases classified elsewhere without behavioral disturbance: Secondary | ICD-10-CM | POA: Diagnosis not present

## 2021-11-05 DIAGNOSIS — R2689 Other abnormalities of gait and mobility: Secondary | ICD-10-CM | POA: Diagnosis not present

## 2021-11-05 DIAGNOSIS — R2681 Unsteadiness on feet: Secondary | ICD-10-CM | POA: Diagnosis not present

## 2021-11-05 DIAGNOSIS — S065X0A Traumatic subdural hemorrhage without loss of consciousness, initial encounter: Secondary | ICD-10-CM | POA: Diagnosis not present

## 2021-11-05 DIAGNOSIS — R262 Difficulty in walking, not elsewhere classified: Secondary | ICD-10-CM | POA: Diagnosis not present

## 2021-11-07 DIAGNOSIS — R2681 Unsteadiness on feet: Secondary | ICD-10-CM | POA: Diagnosis not present

## 2021-11-07 DIAGNOSIS — R2689 Other abnormalities of gait and mobility: Secondary | ICD-10-CM | POA: Diagnosis not present

## 2021-11-07 DIAGNOSIS — R262 Difficulty in walking, not elsewhere classified: Secondary | ICD-10-CM | POA: Diagnosis not present

## 2021-11-07 DIAGNOSIS — S065X0A Traumatic subdural hemorrhage without loss of consciousness, initial encounter: Secondary | ICD-10-CM | POA: Diagnosis not present

## 2021-11-09 DIAGNOSIS — R262 Difficulty in walking, not elsewhere classified: Secondary | ICD-10-CM | POA: Diagnosis not present

## 2021-11-09 DIAGNOSIS — I10 Essential (primary) hypertension: Secondary | ICD-10-CM | POA: Diagnosis not present

## 2021-11-09 DIAGNOSIS — S065X0A Traumatic subdural hemorrhage without loss of consciousness, initial encounter: Secondary | ICD-10-CM | POA: Diagnosis not present

## 2021-11-09 DIAGNOSIS — F02B3 Dementia in other diseases classified elsewhere, moderate, with mood disturbance: Secondary | ICD-10-CM | POA: Diagnosis not present

## 2021-11-09 DIAGNOSIS — R2689 Other abnormalities of gait and mobility: Secondary | ICD-10-CM | POA: Diagnosis not present

## 2021-11-09 DIAGNOSIS — F5101 Primary insomnia: Secondary | ICD-10-CM | POA: Diagnosis not present

## 2021-11-09 DIAGNOSIS — R2681 Unsteadiness on feet: Secondary | ICD-10-CM | POA: Diagnosis not present

## 2021-11-09 DIAGNOSIS — K5900 Constipation, unspecified: Secondary | ICD-10-CM | POA: Diagnosis not present

## 2021-11-11 DIAGNOSIS — F339 Major depressive disorder, recurrent, unspecified: Secondary | ICD-10-CM | POA: Diagnosis not present

## 2021-11-11 DIAGNOSIS — R2681 Unsteadiness on feet: Secondary | ICD-10-CM | POA: Diagnosis not present

## 2021-11-11 DIAGNOSIS — R262 Difficulty in walking, not elsewhere classified: Secondary | ICD-10-CM | POA: Diagnosis not present

## 2021-11-11 DIAGNOSIS — S065X0A Traumatic subdural hemorrhage without loss of consciousness, initial encounter: Secondary | ICD-10-CM | POA: Diagnosis not present

## 2021-11-11 DIAGNOSIS — R2689 Other abnormalities of gait and mobility: Secondary | ICD-10-CM | POA: Diagnosis not present

## 2021-11-11 DIAGNOSIS — M1990 Unspecified osteoarthritis, unspecified site: Secondary | ICD-10-CM | POA: Diagnosis not present

## 2021-11-14 DIAGNOSIS — R2681 Unsteadiness on feet: Secondary | ICD-10-CM | POA: Diagnosis not present

## 2021-11-14 DIAGNOSIS — R262 Difficulty in walking, not elsewhere classified: Secondary | ICD-10-CM | POA: Diagnosis not present

## 2021-11-14 DIAGNOSIS — R2689 Other abnormalities of gait and mobility: Secondary | ICD-10-CM | POA: Diagnosis not present

## 2021-11-14 DIAGNOSIS — S065X0A Traumatic subdural hemorrhage without loss of consciousness, initial encounter: Secondary | ICD-10-CM | POA: Diagnosis not present

## 2021-11-18 DIAGNOSIS — R2689 Other abnormalities of gait and mobility: Secondary | ICD-10-CM | POA: Diagnosis not present

## 2021-11-18 DIAGNOSIS — R262 Difficulty in walking, not elsewhere classified: Secondary | ICD-10-CM | POA: Diagnosis not present

## 2021-11-18 DIAGNOSIS — R2681 Unsteadiness on feet: Secondary | ICD-10-CM | POA: Diagnosis not present

## 2021-11-18 DIAGNOSIS — S065X0A Traumatic subdural hemorrhage without loss of consciousness, initial encounter: Secondary | ICD-10-CM | POA: Diagnosis not present

## 2021-11-21 DIAGNOSIS — R262 Difficulty in walking, not elsewhere classified: Secondary | ICD-10-CM | POA: Diagnosis not present

## 2021-11-21 DIAGNOSIS — S065X0A Traumatic subdural hemorrhage without loss of consciousness, initial encounter: Secondary | ICD-10-CM | POA: Diagnosis not present

## 2021-11-21 DIAGNOSIS — R2681 Unsteadiness on feet: Secondary | ICD-10-CM | POA: Diagnosis not present

## 2021-11-21 DIAGNOSIS — R2689 Other abnormalities of gait and mobility: Secondary | ICD-10-CM | POA: Diagnosis not present

## 2021-11-23 DIAGNOSIS — R262 Difficulty in walking, not elsewhere classified: Secondary | ICD-10-CM | POA: Diagnosis not present

## 2021-11-23 DIAGNOSIS — S065X0A Traumatic subdural hemorrhage without loss of consciousness, initial encounter: Secondary | ICD-10-CM | POA: Diagnosis not present

## 2021-11-23 DIAGNOSIS — R2689 Other abnormalities of gait and mobility: Secondary | ICD-10-CM | POA: Diagnosis not present

## 2021-11-23 DIAGNOSIS — R2681 Unsteadiness on feet: Secondary | ICD-10-CM | POA: Diagnosis not present

## 2021-11-25 DIAGNOSIS — R2689 Other abnormalities of gait and mobility: Secondary | ICD-10-CM | POA: Diagnosis not present

## 2021-11-25 DIAGNOSIS — S065X0A Traumatic subdural hemorrhage without loss of consciousness, initial encounter: Secondary | ICD-10-CM | POA: Diagnosis not present

## 2021-11-25 DIAGNOSIS — R262 Difficulty in walking, not elsewhere classified: Secondary | ICD-10-CM | POA: Diagnosis not present

## 2021-11-25 DIAGNOSIS — R2681 Unsteadiness on feet: Secondary | ICD-10-CM | POA: Diagnosis not present

## 2021-11-28 DIAGNOSIS — S065X0A Traumatic subdural hemorrhage without loss of consciousness, initial encounter: Secondary | ICD-10-CM | POA: Diagnosis not present

## 2021-11-28 DIAGNOSIS — R2689 Other abnormalities of gait and mobility: Secondary | ICD-10-CM | POA: Diagnosis not present

## 2021-11-28 DIAGNOSIS — R2681 Unsteadiness on feet: Secondary | ICD-10-CM | POA: Diagnosis not present

## 2021-11-28 DIAGNOSIS — R262 Difficulty in walking, not elsewhere classified: Secondary | ICD-10-CM | POA: Diagnosis not present

## 2021-11-30 DIAGNOSIS — R262 Difficulty in walking, not elsewhere classified: Secondary | ICD-10-CM | POA: Diagnosis not present

## 2021-11-30 DIAGNOSIS — R2689 Other abnormalities of gait and mobility: Secondary | ICD-10-CM | POA: Diagnosis not present

## 2021-11-30 DIAGNOSIS — S065X0A Traumatic subdural hemorrhage without loss of consciousness, initial encounter: Secondary | ICD-10-CM | POA: Diagnosis not present

## 2021-11-30 DIAGNOSIS — R2681 Unsteadiness on feet: Secondary | ICD-10-CM | POA: Diagnosis not present

## 2021-12-01 DIAGNOSIS — R262 Difficulty in walking, not elsewhere classified: Secondary | ICD-10-CM | POA: Diagnosis not present

## 2021-12-01 DIAGNOSIS — R2689 Other abnormalities of gait and mobility: Secondary | ICD-10-CM | POA: Diagnosis not present

## 2021-12-01 DIAGNOSIS — S065X0A Traumatic subdural hemorrhage without loss of consciousness, initial encounter: Secondary | ICD-10-CM | POA: Diagnosis not present

## 2021-12-01 DIAGNOSIS — R2681 Unsteadiness on feet: Secondary | ICD-10-CM | POA: Diagnosis not present

## 2021-12-05 DIAGNOSIS — R2681 Unsteadiness on feet: Secondary | ICD-10-CM | POA: Diagnosis not present

## 2021-12-05 DIAGNOSIS — S065X0A Traumatic subdural hemorrhage without loss of consciousness, initial encounter: Secondary | ICD-10-CM | POA: Diagnosis not present

## 2021-12-05 DIAGNOSIS — R262 Difficulty in walking, not elsewhere classified: Secondary | ICD-10-CM | POA: Diagnosis not present

## 2021-12-05 DIAGNOSIS — R2689 Other abnormalities of gait and mobility: Secondary | ICD-10-CM | POA: Diagnosis not present

## 2021-12-07 DIAGNOSIS — R2689 Other abnormalities of gait and mobility: Secondary | ICD-10-CM | POA: Diagnosis not present

## 2021-12-07 DIAGNOSIS — I10 Essential (primary) hypertension: Secondary | ICD-10-CM | POA: Diagnosis not present

## 2021-12-07 DIAGNOSIS — S065X0A Traumatic subdural hemorrhage without loss of consciousness, initial encounter: Secondary | ICD-10-CM | POA: Diagnosis not present

## 2021-12-07 DIAGNOSIS — R262 Difficulty in walking, not elsewhere classified: Secondary | ICD-10-CM | POA: Diagnosis not present

## 2021-12-07 DIAGNOSIS — R2681 Unsteadiness on feet: Secondary | ICD-10-CM | POA: Diagnosis not present

## 2021-12-07 DIAGNOSIS — F339 Major depressive disorder, recurrent, unspecified: Secondary | ICD-10-CM | POA: Diagnosis not present

## 2021-12-07 DIAGNOSIS — F5101 Primary insomnia: Secondary | ICD-10-CM | POA: Diagnosis not present

## 2021-12-08 DIAGNOSIS — R2681 Unsteadiness on feet: Secondary | ICD-10-CM | POA: Diagnosis not present

## 2021-12-08 DIAGNOSIS — R2689 Other abnormalities of gait and mobility: Secondary | ICD-10-CM | POA: Diagnosis not present

## 2021-12-08 DIAGNOSIS — S065X0A Traumatic subdural hemorrhage without loss of consciousness, initial encounter: Secondary | ICD-10-CM | POA: Diagnosis not present

## 2021-12-08 DIAGNOSIS — R262 Difficulty in walking, not elsewhere classified: Secondary | ICD-10-CM | POA: Diagnosis not present

## 2021-12-12 DIAGNOSIS — R2689 Other abnormalities of gait and mobility: Secondary | ICD-10-CM | POA: Diagnosis not present

## 2021-12-12 DIAGNOSIS — R262 Difficulty in walking, not elsewhere classified: Secondary | ICD-10-CM | POA: Diagnosis not present

## 2021-12-12 DIAGNOSIS — R2681 Unsteadiness on feet: Secondary | ICD-10-CM | POA: Diagnosis not present

## 2021-12-12 DIAGNOSIS — S065X0A Traumatic subdural hemorrhage without loss of consciousness, initial encounter: Secondary | ICD-10-CM | POA: Diagnosis not present

## 2021-12-13 DIAGNOSIS — R410 Disorientation, unspecified: Secondary | ICD-10-CM | POA: Diagnosis not present

## 2021-12-14 DIAGNOSIS — Z79899 Other long term (current) drug therapy: Secondary | ICD-10-CM | POA: Diagnosis not present

## 2021-12-14 DIAGNOSIS — R2681 Unsteadiness on feet: Secondary | ICD-10-CM | POA: Diagnosis not present

## 2021-12-14 DIAGNOSIS — S065X0A Traumatic subdural hemorrhage without loss of consciousness, initial encounter: Secondary | ICD-10-CM | POA: Diagnosis not present

## 2021-12-14 DIAGNOSIS — R262 Difficulty in walking, not elsewhere classified: Secondary | ICD-10-CM | POA: Diagnosis not present

## 2021-12-14 DIAGNOSIS — R2689 Other abnormalities of gait and mobility: Secondary | ICD-10-CM | POA: Diagnosis not present

## 2021-12-17 DIAGNOSIS — S065X0A Traumatic subdural hemorrhage without loss of consciousness, initial encounter: Secondary | ICD-10-CM | POA: Diagnosis not present

## 2021-12-17 DIAGNOSIS — R2689 Other abnormalities of gait and mobility: Secondary | ICD-10-CM | POA: Diagnosis not present

## 2021-12-17 DIAGNOSIS — R2681 Unsteadiness on feet: Secondary | ICD-10-CM | POA: Diagnosis not present

## 2021-12-17 DIAGNOSIS — R262 Difficulty in walking, not elsewhere classified: Secondary | ICD-10-CM | POA: Diagnosis not present

## 2021-12-19 DIAGNOSIS — S065X0A Traumatic subdural hemorrhage without loss of consciousness, initial encounter: Secondary | ICD-10-CM | POA: Diagnosis not present

## 2021-12-19 DIAGNOSIS — R2689 Other abnormalities of gait and mobility: Secondary | ICD-10-CM | POA: Diagnosis not present

## 2021-12-19 DIAGNOSIS — R2681 Unsteadiness on feet: Secondary | ICD-10-CM | POA: Diagnosis not present

## 2021-12-19 DIAGNOSIS — R262 Difficulty in walking, not elsewhere classified: Secondary | ICD-10-CM | POA: Diagnosis not present

## 2021-12-21 DIAGNOSIS — R2689 Other abnormalities of gait and mobility: Secondary | ICD-10-CM | POA: Diagnosis not present

## 2021-12-21 DIAGNOSIS — R2681 Unsteadiness on feet: Secondary | ICD-10-CM | POA: Diagnosis not present

## 2021-12-21 DIAGNOSIS — S065X0A Traumatic subdural hemorrhage without loss of consciousness, initial encounter: Secondary | ICD-10-CM | POA: Diagnosis not present

## 2021-12-21 DIAGNOSIS — R262 Difficulty in walking, not elsewhere classified: Secondary | ICD-10-CM | POA: Diagnosis not present

## 2021-12-23 DIAGNOSIS — R2689 Other abnormalities of gait and mobility: Secondary | ICD-10-CM | POA: Diagnosis not present

## 2021-12-23 DIAGNOSIS — S065X0A Traumatic subdural hemorrhage without loss of consciousness, initial encounter: Secondary | ICD-10-CM | POA: Diagnosis not present

## 2021-12-23 DIAGNOSIS — R262 Difficulty in walking, not elsewhere classified: Secondary | ICD-10-CM | POA: Diagnosis not present

## 2021-12-23 DIAGNOSIS — R2681 Unsteadiness on feet: Secondary | ICD-10-CM | POA: Diagnosis not present

## 2021-12-28 DIAGNOSIS — R2689 Other abnormalities of gait and mobility: Secondary | ICD-10-CM | POA: Diagnosis not present

## 2021-12-28 DIAGNOSIS — R262 Difficulty in walking, not elsewhere classified: Secondary | ICD-10-CM | POA: Diagnosis not present

## 2021-12-28 DIAGNOSIS — R2681 Unsteadiness on feet: Secondary | ICD-10-CM | POA: Diagnosis not present

## 2021-12-28 DIAGNOSIS — S065X0A Traumatic subdural hemorrhage without loss of consciousness, initial encounter: Secondary | ICD-10-CM | POA: Diagnosis not present

## 2022-01-04 DIAGNOSIS — I10 Essential (primary) hypertension: Secondary | ICD-10-CM | POA: Diagnosis not present

## 2022-01-04 DIAGNOSIS — F5101 Primary insomnia: Secondary | ICD-10-CM | POA: Diagnosis not present

## 2022-01-04 DIAGNOSIS — F02B3 Dementia in other diseases classified elsewhere, moderate, with mood disturbance: Secondary | ICD-10-CM | POA: Diagnosis not present

## 2022-01-04 DIAGNOSIS — G301 Alzheimer's disease with late onset: Secondary | ICD-10-CM | POA: Diagnosis not present

## 2022-01-10 ENCOUNTER — Emergency Department (HOSPITAL_COMMUNITY): Payer: Medicare Other

## 2022-01-10 ENCOUNTER — Emergency Department (HOSPITAL_COMMUNITY)
Admission: EM | Admit: 2022-01-10 | Discharge: 2022-01-11 | Disposition: A | Discharge status: Home / Self Care | Payer: Medicare Other | Attending: Emergency Medicine | Admitting: Emergency Medicine

## 2022-01-10 DIAGNOSIS — S0003XA Contusion of scalp, initial encounter: Secondary | ICD-10-CM | POA: Insufficient documentation

## 2022-01-10 DIAGNOSIS — I6529 Occlusion and stenosis of unspecified carotid artery: Secondary | ICD-10-CM | POA: Diagnosis not present

## 2022-01-10 DIAGNOSIS — N189 Chronic kidney disease, unspecified: Secondary | ICD-10-CM | POA: Diagnosis not present

## 2022-01-10 DIAGNOSIS — W01198A Fall on same level from slipping, tripping and stumbling with subsequent striking against other object, initial encounter: Secondary | ICD-10-CM | POA: Diagnosis not present

## 2022-01-10 DIAGNOSIS — S199XXA Unspecified injury of neck, initial encounter: Secondary | ICD-10-CM | POA: Diagnosis not present

## 2022-01-10 DIAGNOSIS — G309 Alzheimer's disease, unspecified: Secondary | ICD-10-CM | POA: Insufficient documentation

## 2022-01-10 DIAGNOSIS — E1122 Type 2 diabetes mellitus with diabetic chronic kidney disease: Secondary | ICD-10-CM | POA: Insufficient documentation

## 2022-01-10 DIAGNOSIS — I129 Hypertensive chronic kidney disease with stage 1 through stage 4 chronic kidney disease, or unspecified chronic kidney disease: Secondary | ICD-10-CM | POA: Insufficient documentation

## 2022-01-10 DIAGNOSIS — W19XXXA Unspecified fall, initial encounter: Secondary | ICD-10-CM | POA: Diagnosis not present

## 2022-01-10 DIAGNOSIS — M2578 Osteophyte, vertebrae: Secondary | ICD-10-CM | POA: Diagnosis not present

## 2022-01-10 DIAGNOSIS — S40022A Contusion of left upper arm, initial encounter: Secondary | ICD-10-CM | POA: Insufficient documentation

## 2022-01-10 DIAGNOSIS — Z79899 Other long term (current) drug therapy: Secondary | ICD-10-CM | POA: Diagnosis not present

## 2022-01-10 DIAGNOSIS — M25572 Pain in left ankle and joints of left foot: Secondary | ICD-10-CM | POA: Diagnosis not present

## 2022-01-10 DIAGNOSIS — F0390 Unspecified dementia without behavioral disturbance: Secondary | ICD-10-CM | POA: Insufficient documentation

## 2022-01-10 DIAGNOSIS — Z7982 Long term (current) use of aspirin: Secondary | ICD-10-CM | POA: Diagnosis not present

## 2022-01-10 DIAGNOSIS — S0990XA Unspecified injury of head, initial encounter: Secondary | ICD-10-CM | POA: Diagnosis not present

## 2022-01-10 DIAGNOSIS — M4312 Spondylolisthesis, cervical region: Secondary | ICD-10-CM | POA: Diagnosis not present

## 2022-01-10 DIAGNOSIS — M25551 Pain in right hip: Secondary | ICD-10-CM | POA: Diagnosis not present

## 2022-01-10 NOTE — ED Provider Notes (Signed)
[facility] EMERGENCY DEPARTMENT Provider Note   CSN: [id number] Arrival date & time: 01/10/22  2035     History  Chief Complaint  Patient presents with   [name]    [name] is a 81 y.o. female.  81 year old female brought in by EMS from [facility] after a fall.  EMS reports staff told them the patient was chasing them when she fell and hit the left side of her head.  Patient is not anticoagulated, was placed in a c-collar.  Patient arrives in the emergency room confused, denies any injuries. History of diabetes, hypertension dementia, Alzheimer's, chronic kidney disease       Home Medications Prior to Admission medications   Medication Sig Start Date End Date Taking? Authorizing Provider  amLODipine (NORVASC) 5 MG tablet Take 1 tablet (5 mg total) by mouth daily. 05/21/21   Janith Lima, MD  aspirin 81 MG EC tablet Take 1 tablet (81 mg total) by mouth daily. Swallow whole. 04/26/21   Janith Lima, MD  citalopram (CELEXA) 10 MG tablet Take 1 tablet (10 mg total) by mouth daily. 05/21/21   Janith Lima, MD  Memantine HCl-Donepezil HCl [facility]) 28-10 MG CP24 Take 1 capsule at bedtime 05/21/21   Janith Lima, MD  propranolol (INDERAL) 20 MG tablet Take 1 tablet (20 mg total) by mouth 2 (two) times daily. 05/21/21   Janith Lima, MD  QUEtiapine (SEROQUEL) 25 MG tablet Take 1 tablet (25 mg total) by mouth 2 (two) times daily between meals as needed. 08/12/21   Janith Lima, MD      Allergies    Pneumococcal vaccines, Wound dressing adhesive, Ampicillin, Neomycin-bacitracin zn-polymyx, and Penicillins    Review of Systems   Review of Systems Level 5 caveat for dementia Physical Exam Updated Vital Signs BP (!) 194/75 (BP Location: Right Arm)   Pulse 88   Temp 99.3 F (37.4 C) (Oral)   Resp (!) 21   SpO2 99%  Physical Exam Vitals and nursing note reviewed.  Constitutional:      General: She is not in acute distress.     Appearance: She is well-developed. She is not diaphoretic.     Interventions: Cervical collar in place.  HENT:     Head: Normocephalic and atraumatic.     Mouth/Throat:     Mouth: Mucous membranes are moist.  Eyes:     Extraocular Movements: Extraocular movements intact.     Pupils: Pupils are equal, round, and reactive to light.  Cardiovascular:     Rate and Rhythm: Normal rate and regular rhythm.     Heart sounds: Normal heart sounds.  Pulmonary:     Effort: Pulmonary effort is normal.     Breath sounds: Normal breath sounds.  Abdominal:     Palpations: Abdomen is soft.     Tenderness: There is no abdominal tenderness.  Musculoskeletal:        General: Tenderness present. No swelling.     Thoracic back: No tenderness or bony tenderness.     Lumbar back: No tenderness or bony tenderness.     Right lower leg: No edema.     Left lower leg: No edema.     Comments: No pain with logroll of hips, palpation of knees or ankles. Does have ecchymosis to left proximal humerus laterally without deformity, is able to range shoulder and elbow without difficulty.  Skin:    General: Skin is warm and dry.  Findings: Bruising present.  Neurological:     Mental Status: She is alert.  Psychiatric:        Behavior: Behavior normal.     ED Results / Procedures / Treatments   Labs (all labs ordered are listed, but only abnormal results are displayed) Labs Reviewed - No data to display  EKG None  Radiology DG Humerus Left  Result Date: 01/10/2022 CLINICAL DATA:  Fall, left arm bruising EXAM: LEFT HUMERUS - 2+ VIEW COMPARISON:  None Available. FINDINGS: There is no evidence of fracture or other focal bone lesions. Mild acromioclavicular degenerative arthritis is incidentally noted. Soft tissues are unremarkable. IMPRESSION: No acute fracture or dislocation. Electronically Signed   By: Fidela Salisbury M.D.   On: 01/10/2022 22:24   CT Cervical Spine Wo Contrast  Result Date:  01/10/2022 CLINICAL DATA:  Neck trauma (Age >= 65y) EXAM: CT CERVICAL SPINE WITHOUT CONTRAST TECHNIQUE: Multidetector CT imaging of the cervical spine was performed without intravenous contrast. Multiplanar CT image reconstructions were also generated. RADIATION DOSE REDUCTION: This exam was performed according to the departmental dose-optimization program which includes automated exposure control, adjustment of the mA and/or kV according to patient size and/or use of iterative reconstruction technique. COMPARISON:  CT 06/30/2021 FINDINGS: Alignment: Straightening of normal lordosis. Stable 4 mm anterolisthesis of C3 on C4. Skull base and vertebrae: Anterior fusion hardware C4 through C7. Intact hardware. No acute fracture. Stable T2 superior endplate compression deformity from prior. Vertebral body heights are maintained. The dens and skull base are intact. Soft tissues and spinal canal: No prevertebral fluid or swelling. No visible canal hematoma. Disc levels: Prominent disc space narrowing and spurring at C3-C4 with stable anterolisthesis. Mild facet hypertrophy at this level. Posterior disc osteophyte complex at C7-T1. Fusion from C4-C5 through C6-C7. Upper chest: No acute or unexpected findings. Other: Carotid calcifications. IMPRESSION: 1. No acute fracture of the cervical spine. 2. Anterior fusion hardware C4 through C7. Unchanged anterolisthesis of C3 on C4. 3. Stable degenerative change. Electronically Signed   By: Keith Rake M.D.   On: 01/10/2022 22:22   CT Head Wo Contrast  Result Date: 01/10/2022 CLINICAL DATA:  Head trauma, minor (Age >= 65y) Fall hitting left side of head. EXAM: CT HEAD WITHOUT CONTRAST TECHNIQUE: Contiguous axial images were obtained from the base of the skull through the vertex without intravenous contrast. RADIATION DOSE REDUCTION: This exam was performed according to the departmental dose-optimization program which includes automated exposure control, adjustment of the mA  and/or kV according to patient size and/or use of iterative reconstruction technique. COMPARISON:  Head CT 06/30/2021 FINDINGS: Brain: Stable degree of atrophy and chronic small vessel ischemia. Stable ventriculomegaly. No hemorrhage, evidence of acute ischemia, subdural or extra-axial collection. No midline shift or mass effect. Vascular: Atherosclerosis of skullbase vasculature without hyperdense vessel or abnormal calcification. Skull: No fracture or focal lesion. Sinuses/Orbits: No acute findings. Other: Left parietal scalp hematoma. IMPRESSION: 1. Left parietal scalp hematoma. No acute intracranial abnormality. No skull fracture. 2. Stable atrophy and chronic small vessel ischemia. Electronically Signed   By: Keith Rake M.D.   On: 01/10/2022 22:18    Procedures Procedures    Medications Ordered in ED Medications - No data to display  ED Course/ Medical Decision Making/ A&P                           Medical Decision Making Amount and/or Complexity of Data Reviewed Radiology: ordered.   This patient  presents to the ED for concern of fall, not on thinners, this involves an extensive number of treatment options, and is a complaint that carries with it a high risk of complications and morbidity.  The differential diagnosis includes but not limited to intracranial injury, c-spine injury, humerus fracture   Co morbidities that complicate the patient evaluation  Dementia, Alzheimer, hypertension, diabetes, CKD   Additional history obtained:  Additional history obtained from EMS, per nursing home patient was chasing them when she fell, has swelling to left side of head External records from outside source obtained and reviewed including past medical history obtained from office visit from 07/20/2021  Imaging Studies ordered:  I ordered imaging studies including CT head, C-spine, x-ray left humerus I independently visualized and interpreted imaging which showed no acute injury I  agree with the radiologist interpretation   Problem List / ED Course / Critical interventions / Medication management  81 year old female brought in by EMS from memory care facility after a fall, not anticoagulated.  Found to have contusion to left side scalp as well as left humerus.  CT head, C-spine, x-ray left humerus are negative for acute injury.  Patient is discharged back to facility. I have reviewed the patients home medicines and have made adjustments as needed   Social Determinants of Health:  Lives at memory care facility   Test / Admission - Considered:  Lab work considered however not necessary secondary to witnessed mechanical fall at facility          Final Clinical Impression(s) / ED Diagnoses Final diagnoses:  Fall, initial encounter  Contusion of scalp, initial encounter  Contusion of left upper arm, initial encounter    Rx / DC Orders ED Discharge Orders     None         Tacy Learn, PA-C 28/31/51 [id number]    Campbell Stall P, DO 07/37/10 2328

## 2022-01-10 NOTE — ED Notes (Signed)
3rd on PTAR list  

## 2022-01-10 NOTE — ED Triage Notes (Signed)
Pt arrives to ED from [facility] unit BIB [facility] due to a fall. Per EMS staff reports that pt was chasing them and fell hitting the left side of her head. Pt has Hematoma to Lf head, C/O of RT pain. Denies LOC, Denies Blood Thinners. Pt arrived wearing C-Collar.  Hx of HTN, Dementia.  BP 144/72 HR 85 R 16 O2 96% RA

## 2022-01-11 DIAGNOSIS — R4182 Altered mental status, unspecified: Secondary | ICD-10-CM | POA: Diagnosis not present

## 2022-01-11 DIAGNOSIS — W19XXXA Unspecified fall, initial encounter: Secondary | ICD-10-CM | POA: Diagnosis not present

## 2022-01-11 DIAGNOSIS — Z7401 Bed confinement status: Secondary | ICD-10-CM | POA: Diagnosis not present

## 2022-01-17 ENCOUNTER — Encounter (HOSPITAL_COMMUNITY): Payer: Self-pay | Admitting: Emergency Medicine

## 2022-01-17 ENCOUNTER — Emergency Department (HOSPITAL_COMMUNITY): Payer: Medicare Other

## 2022-01-17 ENCOUNTER — Emergency Department (HOSPITAL_COMMUNITY)
Admission: EM | Admit: 2022-01-17 | Discharge: 2022-01-17 | Disposition: A | Discharge status: Skilled Nursing Facility | Payer: Medicare Other | Attending: Emergency Medicine | Admitting: Emergency Medicine

## 2022-01-17 DIAGNOSIS — S0121XA Laceration without foreign body of nose, initial encounter: Secondary | ICD-10-CM | POA: Diagnosis not present

## 2022-01-17 DIAGNOSIS — W19XXXA Unspecified fall, initial encounter: Secondary | ICD-10-CM | POA: Diagnosis not present

## 2022-01-17 DIAGNOSIS — T148XXA Other injury of unspecified body region, initial encounter: Secondary | ICD-10-CM

## 2022-01-17 DIAGNOSIS — S032XXA Dislocation of tooth, initial encounter: Secondary | ICD-10-CM

## 2022-01-17 DIAGNOSIS — S0990XA Unspecified injury of head, initial encounter: Secondary | ICD-10-CM | POA: Diagnosis not present

## 2022-01-17 DIAGNOSIS — R220 Localized swelling, mass and lump, head: Secondary | ICD-10-CM | POA: Diagnosis not present

## 2022-01-17 DIAGNOSIS — W010XXA Fall on same level from slipping, tripping and stumbling without subsequent striking against object, initial encounter: Secondary | ICD-10-CM | POA: Diagnosis not present

## 2022-01-17 DIAGNOSIS — I10 Essential (primary) hypertension: Secondary | ICD-10-CM | POA: Diagnosis not present

## 2022-01-17 DIAGNOSIS — S0081XA Abrasion of other part of head, initial encounter: Secondary | ICD-10-CM | POA: Insufficient documentation

## 2022-01-17 DIAGNOSIS — R4182 Altered mental status, unspecified: Secondary | ICD-10-CM | POA: Diagnosis not present

## 2022-01-17 DIAGNOSIS — R609 Edema, unspecified: Secondary | ICD-10-CM | POA: Diagnosis not present

## 2022-01-17 DIAGNOSIS — Z7982 Long term (current) use of aspirin: Secondary | ICD-10-CM | POA: Diagnosis not present

## 2022-01-17 DIAGNOSIS — Z743 Need for continuous supervision: Secondary | ICD-10-CM | POA: Diagnosis not present

## 2022-01-17 DIAGNOSIS — Z043 Encounter for examination and observation following other accident: Secondary | ICD-10-CM | POA: Diagnosis not present

## 2022-01-17 DIAGNOSIS — R58 Hemorrhage, not elsewhere classified: Secondary | ICD-10-CM | POA: Diagnosis not present

## 2022-01-17 DIAGNOSIS — Z79899 Other long term (current) drug therapy: Secondary | ICD-10-CM | POA: Insufficient documentation

## 2022-01-17 MED ORDER — LIDOCAINE-EPINEPHRINE-TETRACAINE (LET) TOPICAL GEL
3.0000 mL | Freq: Once | TOPICAL | Status: AC
  Administered 2022-01-17: 3 mL via TOPICAL
  Filled 2022-01-17: qty 3

## 2022-01-17 MED ORDER — HALOPERIDOL LACTATE 5 MG/ML IJ SOLN
2.0000 mg | Freq: Once | INTRAMUSCULAR | Status: AC
  Administered 2022-01-17: 2 mg via INTRAMUSCULAR
  Filled 2022-01-17: qty 1

## 2022-01-17 NOTE — ED Provider Notes (Signed)
[facility] DEPT Provider Note   CSN: [id number] Arrival date & time: 01/17/22  1554     History  Chief Complaint  Patient presents with   Lytle Michaels    DEMESHA BOORMAN is a 81 y.o. female.  HPI Patient with dementia, multiple other medical issues presents with concern of facial injuries following a fall.  Patient cannot provide details, level 5 caveat.  Per report the patient was at her nursing home, when she had a witnessed fall, no reported loss of consciousness, no change from baseline behavior.    Home Medications Prior to Admission medications   Medication Sig Start Date End Date Taking? Authorizing Provider  amLODipine (NORVASC) 5 MG tablet Take 1 tablet (5 mg total) by mouth daily. 05/21/21  Yes Janith Lima, MD  aspirin 81 MG EC tablet Take 1 tablet (81 mg total) by mouth daily. Swallow whole. 04/26/21  Yes Janith Lima, MD  chlorhexidine gluconate, MEDLINE KIT, (PERIDEX) 0.12 % solution Use as directed 15 mLs in the mouth or throat See admin instructions. Swish 15 ml's in the mouth and hold for 2 minutes, then spit out- TWICE A DAY   Yes [provider]  citalopram (CELEXA) 10 MG tablet Take 1 tablet (10 mg total) by mouth daily. 05/21/21  Yes Janith Lima, MD  donepezil (ARICEPT) 10 MG tablet Take 10 mg by mouth at bedtime.   Yes [provider]  hydrOXYzine (ATARAX) 50 MG tablet Take 50 mg by mouth at bedtime.   Yes [provider]  hydrOXYzine (VISTARIL) 50 MG capsule Take 50 mg by mouth at bedtime.   Yes [provider]  melatonin 3 MG TABS tablet Take 3 mg by mouth at bedtime.   Yes [provider]  NAMENDA XR 28 MG CP24 24 hr capsule Take 28 mg by mouth at bedtime.   Yes [provider]  propranolol (INDERAL) 20 MG tablet Take 1 tablet (20 mg total) by mouth 2 (two) times daily. 05/21/21  Yes Janith Lima, MD  QUEtiapine (SEROQUEL) 25 MG tablet Take 1 tablet (25 mg total) by mouth  2 (two) times daily between meals as needed. 08/12/21  Yes Janith Lima, MD  SENEXON-S 8.6-50 MG tablet Take 2 tablets by mouth at bedtime.   Yes [provider]  traMADol (ULTRAM) 50 MG tablet Take 50 mg by mouth See admin instructions. Take 50 mg by mouth every 4-6 hours as needed for pain   Yes [provider]  Memantine HCl-Donepezil HCl [facility]) 28-10 MG CP24 Take 1 capsule at bedtime Patient not taking: Reported on 01/17/2022 05/21/21   Janith Lima, MD      Allergies    Pneumococcal vaccines, Wound dressing adhesive, Zinc, Ampicillin, Neomycin-bacitracin zn-polymyx, and Penicillins    Review of Systems   Review of Systems  Unable to perform ROS: Dementia    Physical Exam Updated Vital Signs BP (!) 174/69 (BP Location: Right Arm)   Pulse 79   Temp 98.1 F (36.7 C) (Axillary)   Resp 18   SpO2 98%  Physical Exam Vitals and nursing note reviewed.  Constitutional:      General: She is not in acute distress.    Appearance: She is well-developed. She is not toxic-appearing or diaphoretic.  HENT:     Head: Normocephalic.      Mouth/Throat:   Eyes:     Conjunctiva/sclera: Conjunctivae normal.  Cardiovascular:     Rate and Rhythm: Normal rate  and regular rhythm.  Pulmonary:     Effort: Pulmonary effort is normal. No respiratory distress.     Breath sounds: Normal breath sounds. No stridor.  Abdominal:     General: There is no distension.  Skin:    General: Skin is warm and dry.  Neurological:     Mental Status: She is alert.     Cranial Nerves: No cranial nerve deficit.     Motor: Atrophy present. No tremor.     Comments: Face is symmetric speech is brief, clear, patient moves extremities to command.  Psychiatric:        Mood and Affect: Affect is tearful.        Cognition and Memory: Memory is impaired.     ED Results / Procedures / Treatments   Labs (all labs ordered are listed, but only abnormal results are displayed) Labs Reviewed -  No data to display  EKG None  Radiology CT Head Wo Contrast  Result Date: 01/17/2022 CLINICAL DATA:  Poly trauma, witnessed tripping and falling, face struck floor, laceration and swelling to nose, front tooth knocked out, no loss of consciousness. History dementia, type II diabetes mellitus, hypertension EXAM: CT HEAD WITHOUT CONTRAST CT MAXILLOFACIAL WITHOUT CONTRAST CT CERVICAL SPINE WITHOUT CONTRAST TECHNIQUE: Multidetector CT imaging of the head, cervical spine, and maxillofacial structures were performed using the standard protocol without intravenous contrast. Multiplanar CT image reconstructions of the cervical spine and maxillofacial structures were also generated. RADIATION DOSE REDUCTION: This exam was performed according to the departmental dose-optimization program which includes automated exposure control, adjustment of the mA and/or kV according to patient size and/or use of iterative reconstruction technique. Right side of face marked with BB. COMPARISON:  CT head 09/17/2021, CT cervical spine 09/10/2021 FINDINGS: CT HEAD FINDINGS Brain: Generalized atrophy. Normal ventricular morphology. No midline shift or mass effect. Small vessel chronic ischemic changes of deep cerebral white matter. No intracranial hemorrhage, mass lesion, evidence of acute infarction, or extra-axial fluid collection. Vascular: No hyperdense vessels. Mild atherosclerotic calcification of internal carotid arteries at skull base Skull: Demineralized but intact.  Hyperostosis frontalis interna Other: N/A CT MAXILLOFACIAL FINDINGS Osseous: Hyperostosis frontalis interna. TMJ alignment normal bilaterally. Scattered dental caries. No definite facial bone fractures identified. Orbits: Intraorbital soft tissue planes clear. No orbital gas or pneumatosis. Sinuses: Paranasal sinuses, mastoid air cells, and middle ear cavities clear Soft tissues: Soft tissue swelling and few foci of soft tissue gas at nose, by history laceration.  No other regional soft tissue abnormalities. CT CERVICAL SPINE FINDINGS Alignment: Anterolisthesis of 2.5 mm at C3-C4 unchanged. Remaining alignments normal. Skull base and vertebrae: Osseous demineralization. Visualized skull base intact. Prior anterior fusion C4-C7. Slight concavity superior endplate T2 unchanged. Disc space narrowing throughout cervical spine. Multilevel facet degenerative changes. No acute fracture, additional subluxation, or bone destruction. Soft tissues and spinal canal: Prevertebral soft tissues normal thickness. Atherosclerotic calcifications at carotid bifurcations and proximal great vessels. 8 mm RIGHT thyroid nodule; not clinically significant; no follow-up imaging recommended. (Ref: J Am Coll Radiol. 2015 Feb;12(2): 143-50). Disc levels:  No focal abnormalities Upper chest: Lung apices clear Other: N/A IMPRESSION: Atrophy with small vessel chronic ischemic changes of deep cerebral white matter. No acute intracranial abnormalities. No acute facial bone abnormalities. Prior anterior fusion C4-C7. 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 No acute cervical spine abnormalities. Electronically Signed   By: Lavonia Dana M.D.   On: 01/17/2022 17:11   CT Maxillofacial WO CM  Result Date:  01/17/2022 CLINICAL DATA:  Poly trauma, witnessed tripping and falling, face struck floor, laceration and swelling to nose, front tooth knocked out, no loss of consciousness. History dementia, type II diabetes mellitus, hypertension EXAM: CT HEAD WITHOUT CONTRAST CT MAXILLOFACIAL WITHOUT CONTRAST CT CERVICAL SPINE WITHOUT CONTRAST TECHNIQUE: Multidetector CT imaging of the head, cervical spine, and maxillofacial structures were performed using the standard protocol without intravenous contrast. Multiplanar CT image reconstructions of the cervical spine and maxillofacial structures were also generated. RADIATION DOSE REDUCTION: This exam was performed according to the  departmental dose-optimization program which includes automated exposure control, adjustment of the mA and/or kV according to patient size and/or use of iterative reconstruction technique. Right side of face marked with BB. COMPARISON:  CT head 09/17/2021, CT cervical spine 09/10/2021 FINDINGS: CT HEAD FINDINGS Brain: Generalized atrophy. Normal ventricular morphology. No midline shift or mass effect. Small vessel chronic ischemic changes of deep cerebral white matter. No intracranial hemorrhage, mass lesion, evidence of acute infarction, or extra-axial fluid collection. Vascular: No hyperdense vessels. Mild atherosclerotic calcification of internal carotid arteries at skull base Skull: Demineralized but intact.  Hyperostosis frontalis interna Other: N/A CT MAXILLOFACIAL FINDINGS Osseous: Hyperostosis frontalis interna. TMJ alignment normal bilaterally. Scattered dental caries. No definite facial bone fractures identified. Orbits: Intraorbital soft tissue planes clear. No orbital gas or pneumatosis. Sinuses: Paranasal sinuses, mastoid air cells, and middle ear cavities clear Soft tissues: Soft tissue swelling and few foci of soft tissue gas at nose, by history laceration. No other regional soft tissue abnormalities. CT CERVICAL SPINE FINDINGS Alignment: Anterolisthesis of 2.5 mm at C3-C4 unchanged. Remaining alignments normal. Skull base and vertebrae: Osseous demineralization. Visualized skull base intact. Prior anterior fusion C4-C7. Slight concavity superior endplate T2 unchanged. Disc space narrowing throughout cervical spine. Multilevel facet degenerative changes. No acute fracture, additional subluxation, or bone destruction. Soft tissues and spinal canal: Prevertebral soft tissues normal thickness. Atherosclerotic calcifications at carotid bifurcations and proximal great vessels. 8 mm RIGHT thyroid nodule; not clinically significant; no follow-up imaging recommended. (Ref: J Am Coll Radiol. 2015 Feb;12(2):  143-50). Disc levels:  No focal abnormalities Upper chest: Lung apices clear Other: N/A IMPRESSION: Atrophy with small vessel chronic ischemic changes of deep cerebral white matter. No acute intracranial abnormalities. No acute facial bone abnormalities. Prior anterior fusion C4-C7. 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 No acute cervical spine abnormalities. Electronically Signed   By: Lavonia Dana M.D.   On: 01/17/2022 17:11   CT Cervical Spine Wo Contrast  Result Date: 01/17/2022 CLINICAL DATA:  Poly trauma, witnessed tripping and falling, face struck floor, laceration and swelling to nose, front tooth knocked out, no loss of consciousness. History dementia, type II diabetes mellitus, hypertension EXAM: CT HEAD WITHOUT CONTRAST CT MAXILLOFACIAL WITHOUT CONTRAST CT CERVICAL SPINE WITHOUT CONTRAST TECHNIQUE: Multidetector CT imaging of the head, cervical spine, and maxillofacial structures were performed using the standard protocol without intravenous contrast. Multiplanar CT image reconstructions of the cervical spine and maxillofacial structures were also generated. RADIATION DOSE REDUCTION: This exam was performed according to the departmental dose-optimization program which includes automated exposure control, adjustment of the mA and/or kV according to patient size and/or use of iterative reconstruction technique. Right side of face marked with BB. COMPARISON:  CT head 09/17/2021, CT cervical spine 09/10/2021 FINDINGS: CT HEAD FINDINGS Brain: Generalized atrophy. Normal ventricular morphology. No midline shift or mass effect. Small vessel chronic ischemic changes of deep cerebral white matter. No intracranial hemorrhage, mass lesion, evidence of acute infarction,  or extra-axial fluid collection. Vascular: No hyperdense vessels. Mild atherosclerotic calcification of internal carotid arteries at skull base Skull: Demineralized but intact.  Hyperostosis frontalis interna  Other: N/A CT MAXILLOFACIAL FINDINGS Osseous: Hyperostosis frontalis interna. TMJ alignment normal bilaterally. Scattered dental caries. No definite facial bone fractures identified. Orbits: Intraorbital soft tissue planes clear. No orbital gas or pneumatosis. Sinuses: Paranasal sinuses, mastoid air cells, and middle ear cavities clear Soft tissues: Soft tissue swelling and few foci of soft tissue gas at nose, by history laceration. No other regional soft tissue abnormalities. CT CERVICAL SPINE FINDINGS Alignment: Anterolisthesis of 2.5 mm at C3-C4 unchanged. Remaining alignments normal. Skull base and vertebrae: Osseous demineralization. Visualized skull base intact. Prior anterior fusion C4-C7. Slight concavity superior endplate T2 unchanged. Disc space narrowing throughout cervical spine. Multilevel facet degenerative changes. No acute fracture, additional subluxation, or bone destruction. Soft tissues and spinal canal: Prevertebral soft tissues normal thickness. Atherosclerotic calcifications at carotid bifurcations and proximal great vessels. 8 mm RIGHT thyroid nodule; not clinically significant; no follow-up imaging recommended. (Ref: J Am Coll Radiol. 2015 Feb;12(2): 143-50). Disc levels:  No focal abnormalities Upper chest: Lung apices clear Other: N/A IMPRESSION: Atrophy with small vessel chronic ischemic changes of deep cerebral white matter. No acute intracranial abnormalities. No acute facial bone abnormalities. Prior anterior fusion C4-C7. 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 No acute cervical spine abnormalities. Electronically Signed   By: Lavonia Dana M.D.   On: 01/17/2022 17:11    Procedures Procedures    Medications Ordered in ED Medications  lidocaine-EPINEPHrine-tetracaine (LET) topical gel (3 mLs Topical Given 01/17/22 1738)    ED Course/ Medical Decision Making/ A&P This patient with a Hx of dementia presents to the ED for concern of fall  second time in 1 week, this involves an extensive number of treatment options, and is a complaint that carries with it a high risk of complications and morbidity.    The differential diagnosis includes cranial abnormality, fracture head, neck, face   Social Determinants of Health:  Dementia, age  Additional history obtained:  Additional history and/or information obtained from chart review, notable for evaluation 1 week ago following a similar fall with reassuring CT scan   After the initial evaluation, orders, including: CTs  were initiated.    The patient was also maintained on pulse oximetry. The readings were typically 99% room air   On repeat evaluation of the patient stayed the same  Imaging Studies ordered:  I independently visualized and interpreted imaging which showed no intracranial abnormality, no skull fracture, neck fracture, obvious facial bone fracture. I agree with the radiologist interpretation  Consultations Obtained:  I requested consultation with the dentistry given the avulsed tooth,  and discussed lab and imaging findings as well as pertinent plan - they recommend: Sideration of insertion, though recognizing the risks of this falling off, aspiration, and the likelihood of it not being successful given the patient's advanced age, unclear condition of the tooth following a fall.  Dentistry also available for follow-up  Dispostion / Final MDM:  After consideration of the diagnostic results and the patient's response to treatment, this adult female with dementia, fall a week ago, similar circumstances now presents after fall with multiple small facial wounds, hemostasis is achieved with L ET, pressure, no wounds requiring surgical/sutures/Dermabond, with patient was picking at her scabs, and is may be the case going forward.  Patient CT scan is reassuring, she is hemodynamically unremarkable, neuro exam is similar to  prior evaluation, and left message for the  patient's son informing him of all findings thus far.  After discussed patient's case with nursing staff at her facility particularly given the patient's avulsion of tooth with reimplantation here, and need for close outpatient dentistry follow-up.  On repeat exam the patient is exhibiting some evidence for sundowning, requiring intramuscular Haldol.  Patient was not amenable to temporary bridge with Dermabond or dental cement, though the tooth remained in place throughout the duration of the patient's stay following reimplantation.  Final Clinical Impression(s) / ED Diagnoses Final diagnoses:  Fall, initial encounter  Injury of head, initial encounter  Abrasion  Tooth avulsion, initial encounter     Carmin Muskrat, MD 01/17/22 2339

## 2022-01-17 NOTE — ED Notes (Signed)
Pt is trying to get out of bed. Hitting staff, and yelling, pt is refusing to get in bed, attempted to talk to pt but she keeps yelling help.

## 2022-01-17 NOTE — Discharge Instructions (Signed)
As discussed, today's evaluation has been somewhat reassuring.  There is no evidence for skull fracture, neck fracture or facial bone fracture that is obvious.  However, you did avulse one of your teeth and this requires close dentistry follow-up.  Return here for concerning changes in your condition.

## 2022-01-17 NOTE — ED Notes (Signed)
Called [facility] for transport, called daughter cheryl to ask if she would like to take patient back to [facility] and she stated pt does better going by [facility] due to confusion. Called report to belinda at [facility]

## 2022-01-17 NOTE — ED Notes (Signed)
Pt refused vital signs.

## 2022-01-17 NOTE — ED Notes (Signed)
Pt scratched and attempted to bite this RN.

## 2022-01-17 NOTE — ED Triage Notes (Signed)
Per EMS, patient from [place] [facility], witnessed trip and fall. No LOC. Face hitting floor. Laceration and swelling to nose. Front tooth out and in sample cup at bedside. Hx dementia. A&Ox2.  BP 190/90 HR 80 RR 20 99% RA CBG 113

## 2022-01-17 NOTE — ED Notes (Addendum)
Patient agitated. Unable to collect vitals at this time.

## 2022-01-17 NOTE — ED Notes (Signed)
Pt refused discharge vitals 

## 2022-01-18 DIAGNOSIS — F339 Major depressive disorder, recurrent, unspecified: Secondary | ICD-10-CM | POA: Diagnosis not present

## 2022-01-18 DIAGNOSIS — M1990 Unspecified osteoarthritis, unspecified site: Secondary | ICD-10-CM | POA: Diagnosis not present

## 2022-01-18 DIAGNOSIS — W010XXA Fall on same level from slipping, tripping and stumbling without subsequent striking against object, initial encounter: Secondary | ICD-10-CM | POA: Diagnosis not present

## 2022-01-18 DIAGNOSIS — R296 Repeated falls: Secondary | ICD-10-CM | POA: Diagnosis not present

## 2022-01-18 DIAGNOSIS — F02B3 Dementia in other diseases classified elsewhere, moderate, with mood disturbance: Secondary | ICD-10-CM | POA: Diagnosis not present

## 2022-01-18 DIAGNOSIS — S032XXA Dislocation of tooth, initial encounter: Secondary | ICD-10-CM | POA: Diagnosis not present

## 2022-01-18 DIAGNOSIS — G301 Alzheimer's disease with late onset: Secondary | ICD-10-CM | POA: Diagnosis not present

## 2022-01-18 DIAGNOSIS — S0081XA Abrasion of other part of head, initial encounter: Secondary | ICD-10-CM | POA: Diagnosis not present

## 2022-01-25 DIAGNOSIS — F5101 Primary insomnia: Secondary | ICD-10-CM | POA: Diagnosis not present

## 2022-01-25 DIAGNOSIS — I10 Essential (primary) hypertension: Secondary | ICD-10-CM | POA: Diagnosis not present

## 2022-01-25 DIAGNOSIS — G301 Alzheimer's disease with late onset: Secondary | ICD-10-CM | POA: Diagnosis not present

## 2022-01-25 DIAGNOSIS — F02B3 Dementia in other diseases classified elsewhere, moderate, with mood disturbance: Secondary | ICD-10-CM | POA: Diagnosis not present

## 2022-02-01 DIAGNOSIS — F5101 Primary insomnia: Secondary | ICD-10-CM | POA: Diagnosis not present

## 2022-02-01 DIAGNOSIS — G301 Alzheimer's disease with late onset: Secondary | ICD-10-CM | POA: Diagnosis not present

## 2022-02-01 DIAGNOSIS — F339 Major depressive disorder, recurrent, unspecified: Secondary | ICD-10-CM | POA: Diagnosis not present

## 2022-02-01 DIAGNOSIS — F02B3 Dementia in other diseases classified elsewhere, moderate, with mood disturbance: Secondary | ICD-10-CM | POA: Diagnosis not present

## 2022-02-15 DIAGNOSIS — F02B3 Dementia in other diseases classified elsewhere, moderate, with mood disturbance: Secondary | ICD-10-CM | POA: Diagnosis not present

## 2022-02-15 DIAGNOSIS — M159 Polyosteoarthritis, unspecified: Secondary | ICD-10-CM | POA: Diagnosis not present

## 2022-02-15 DIAGNOSIS — G301 Alzheimer's disease with late onset: Secondary | ICD-10-CM | POA: Diagnosis not present

## 2022-02-15 DIAGNOSIS — I10 Essential (primary) hypertension: Secondary | ICD-10-CM | POA: Diagnosis not present

## 2022-02-16 DIAGNOSIS — F339 Major depressive disorder, recurrent, unspecified: Secondary | ICD-10-CM | POA: Diagnosis not present

## 2022-02-16 DIAGNOSIS — M159 Polyosteoarthritis, unspecified: Secondary | ICD-10-CM | POA: Diagnosis not present

## 2022-02-24 DIAGNOSIS — F03918 Unspecified dementia, unspecified severity, with other behavioral disturbance: Secondary | ICD-10-CM | POA: Diagnosis not present

## 2022-02-24 DIAGNOSIS — N183 Chronic kidney disease, stage 3 unspecified: Secondary | ICD-10-CM | POA: Diagnosis not present

## 2022-02-24 DIAGNOSIS — I119 Hypertensive heart disease without heart failure: Secondary | ICD-10-CM | POA: Diagnosis not present

## 2022-02-24 DIAGNOSIS — I10 Essential (primary) hypertension: Secondary | ICD-10-CM | POA: Diagnosis not present

## 2022-02-24 DIAGNOSIS — G301 Alzheimer's disease with late onset: Secondary | ICD-10-CM | POA: Diagnosis not present

## 2022-02-27 DIAGNOSIS — I119 Hypertensive heart disease without heart failure: Secondary | ICD-10-CM | POA: Diagnosis not present

## 2022-02-27 DIAGNOSIS — F03918 Unspecified dementia, unspecified severity, with other behavioral disturbance: Secondary | ICD-10-CM | POA: Diagnosis not present

## 2022-02-27 DIAGNOSIS — G301 Alzheimer's disease with late onset: Secondary | ICD-10-CM | POA: Diagnosis not present

## 2022-02-27 DIAGNOSIS — I10 Essential (primary) hypertension: Secondary | ICD-10-CM | POA: Diagnosis not present

## 2022-02-27 DIAGNOSIS — N183 Chronic kidney disease, stage 3 unspecified: Secondary | ICD-10-CM | POA: Diagnosis not present

## 2022-03-01 DIAGNOSIS — I10 Essential (primary) hypertension: Secondary | ICD-10-CM | POA: Diagnosis not present

## 2022-03-01 DIAGNOSIS — F02B3 Dementia in other diseases classified elsewhere, moderate, with mood disturbance: Secondary | ICD-10-CM | POA: Diagnosis not present

## 2022-03-01 DIAGNOSIS — W010XXA Fall on same level from slipping, tripping and stumbling without subsequent striking against object, initial encounter: Secondary | ICD-10-CM | POA: Diagnosis not present

## 2022-03-01 DIAGNOSIS — F339 Major depressive disorder, recurrent, unspecified: Secondary | ICD-10-CM | POA: Diagnosis not present

## 2022-03-01 DIAGNOSIS — N1832 Chronic kidney disease, stage 3b: Secondary | ICD-10-CM | POA: Diagnosis not present

## 2022-03-01 DIAGNOSIS — F03918 Unspecified dementia, unspecified severity, with other behavioral disturbance: Secondary | ICD-10-CM | POA: Diagnosis not present

## 2022-03-01 DIAGNOSIS — N183 Chronic kidney disease, stage 3 unspecified: Secondary | ICD-10-CM | POA: Diagnosis not present

## 2022-03-01 DIAGNOSIS — G301 Alzheimer's disease with late onset: Secondary | ICD-10-CM | POA: Diagnosis not present

## 2022-03-01 DIAGNOSIS — I119 Hypertensive heart disease without heart failure: Secondary | ICD-10-CM | POA: Diagnosis not present

## 2022-03-01 DIAGNOSIS — I129 Hypertensive chronic kidney disease with stage 1 through stage 4 chronic kidney disease, or unspecified chronic kidney disease: Secondary | ICD-10-CM | POA: Diagnosis not present

## 2022-03-02 DIAGNOSIS — M2011 Hallux valgus (acquired), right foot: Secondary | ICD-10-CM | POA: Diagnosis not present

## 2022-03-02 DIAGNOSIS — E1151 Type 2 diabetes mellitus with diabetic peripheral angiopathy without gangrene: Secondary | ICD-10-CM | POA: Diagnosis not present

## 2022-03-02 DIAGNOSIS — M79675 Pain in left toe(s): Secondary | ICD-10-CM | POA: Diagnosis not present

## 2022-03-02 DIAGNOSIS — B351 Tinea unguium: Secondary | ICD-10-CM | POA: Diagnosis not present

## 2022-03-02 DIAGNOSIS — L84 Corns and callosities: Secondary | ICD-10-CM | POA: Diagnosis not present

## 2022-03-03 DIAGNOSIS — I10 Essential (primary) hypertension: Secondary | ICD-10-CM | POA: Diagnosis not present

## 2022-03-03 DIAGNOSIS — I119 Hypertensive heart disease without heart failure: Secondary | ICD-10-CM | POA: Diagnosis not present

## 2022-03-03 DIAGNOSIS — N183 Chronic kidney disease, stage 3 unspecified: Secondary | ICD-10-CM | POA: Diagnosis not present

## 2022-03-03 DIAGNOSIS — G301 Alzheimer's disease with late onset: Secondary | ICD-10-CM | POA: Diagnosis not present

## 2022-03-03 DIAGNOSIS — F03918 Unspecified dementia, unspecified severity, with other behavioral disturbance: Secondary | ICD-10-CM | POA: Diagnosis not present

## 2022-03-06 DIAGNOSIS — I10 Essential (primary) hypertension: Secondary | ICD-10-CM | POA: Diagnosis not present

## 2022-03-06 DIAGNOSIS — I119 Hypertensive heart disease without heart failure: Secondary | ICD-10-CM | POA: Diagnosis not present

## 2022-03-06 DIAGNOSIS — G301 Alzheimer's disease with late onset: Secondary | ICD-10-CM | POA: Diagnosis not present

## 2022-03-06 DIAGNOSIS — F03918 Unspecified dementia, unspecified severity, with other behavioral disturbance: Secondary | ICD-10-CM | POA: Diagnosis not present

## 2022-03-06 DIAGNOSIS — N183 Chronic kidney disease, stage 3 unspecified: Secondary | ICD-10-CM | POA: Diagnosis not present

## 2022-03-08 DIAGNOSIS — I10 Essential (primary) hypertension: Secondary | ICD-10-CM | POA: Diagnosis not present

## 2022-03-08 DIAGNOSIS — N183 Chronic kidney disease, stage 3 unspecified: Secondary | ICD-10-CM | POA: Diagnosis not present

## 2022-03-08 DIAGNOSIS — F03918 Unspecified dementia, unspecified severity, with other behavioral disturbance: Secondary | ICD-10-CM | POA: Diagnosis not present

## 2022-03-08 DIAGNOSIS — G301 Alzheimer's disease with late onset: Secondary | ICD-10-CM | POA: Diagnosis not present

## 2022-03-08 DIAGNOSIS — I119 Hypertensive heart disease without heart failure: Secondary | ICD-10-CM | POA: Diagnosis not present

## 2022-03-10 DIAGNOSIS — I10 Essential (primary) hypertension: Secondary | ICD-10-CM | POA: Diagnosis not present

## 2022-03-10 DIAGNOSIS — F03918 Unspecified dementia, unspecified severity, with other behavioral disturbance: Secondary | ICD-10-CM | POA: Diagnosis not present

## 2022-03-10 DIAGNOSIS — N183 Chronic kidney disease, stage 3 unspecified: Secondary | ICD-10-CM | POA: Diagnosis not present

## 2022-03-10 DIAGNOSIS — G301 Alzheimer's disease with late onset: Secondary | ICD-10-CM | POA: Diagnosis not present

## 2022-03-10 DIAGNOSIS — I119 Hypertensive heart disease without heart failure: Secondary | ICD-10-CM | POA: Diagnosis not present

## 2022-03-13 DIAGNOSIS — I10 Essential (primary) hypertension: Secondary | ICD-10-CM | POA: Diagnosis not present

## 2022-03-13 DIAGNOSIS — G301 Alzheimer's disease with late onset: Secondary | ICD-10-CM | POA: Diagnosis not present

## 2022-03-13 DIAGNOSIS — F03918 Unspecified dementia, unspecified severity, with other behavioral disturbance: Secondary | ICD-10-CM | POA: Diagnosis not present

## 2022-03-13 DIAGNOSIS — N183 Chronic kidney disease, stage 3 unspecified: Secondary | ICD-10-CM | POA: Diagnosis not present

## 2022-03-13 DIAGNOSIS — I119 Hypertensive heart disease without heart failure: Secondary | ICD-10-CM | POA: Diagnosis not present

## 2022-03-14 DIAGNOSIS — Z23 Encounter for immunization: Secondary | ICD-10-CM | POA: Diagnosis not present

## 2022-03-15 DIAGNOSIS — I129 Hypertensive chronic kidney disease with stage 1 through stage 4 chronic kidney disease, or unspecified chronic kidney disease: Secondary | ICD-10-CM | POA: Diagnosis not present

## 2022-03-15 DIAGNOSIS — G301 Alzheimer's disease with late onset: Secondary | ICD-10-CM | POA: Diagnosis not present

## 2022-03-15 DIAGNOSIS — N1832 Chronic kidney disease, stage 3b: Secondary | ICD-10-CM | POA: Diagnosis not present

## 2022-03-15 DIAGNOSIS — F02B3 Dementia in other diseases classified elsewhere, moderate, with mood disturbance: Secondary | ICD-10-CM | POA: Diagnosis not present

## 2022-03-15 DIAGNOSIS — M159 Polyosteoarthritis, unspecified: Secondary | ICD-10-CM | POA: Diagnosis not present

## 2022-03-16 DIAGNOSIS — I119 Hypertensive heart disease without heart failure: Secondary | ICD-10-CM | POA: Diagnosis not present

## 2022-03-16 DIAGNOSIS — G301 Alzheimer's disease with late onset: Secondary | ICD-10-CM | POA: Diagnosis not present

## 2022-03-16 DIAGNOSIS — I10 Essential (primary) hypertension: Secondary | ICD-10-CM | POA: Diagnosis not present

## 2022-03-16 DIAGNOSIS — F03918 Unspecified dementia, unspecified severity, with other behavioral disturbance: Secondary | ICD-10-CM | POA: Diagnosis not present

## 2022-03-16 DIAGNOSIS — N183 Chronic kidney disease, stage 3 unspecified: Secondary | ICD-10-CM | POA: Diagnosis not present

## 2022-03-19 IMAGING — CT CT HEAD W/O CM
4 series · 17 of 47 positions shown, 19 images · non-contrast
Comparison: CT head, 02/02/2021.

CLINICAL DATA: Subdural hematoma.  Follow-up.

EXAM:
CT HEAD WITHOUT CONTRAST
TECHNIQUE: Contiguous axial images were obtained from the base of the skull
through the vertex without intravenous contrast.

[Series 3: head without · axial · non-contrast · 0.43mm/px · z∈[-76,+49]mm · 7 of 35 slices shown, 9 images]
[im 5/35  brain]
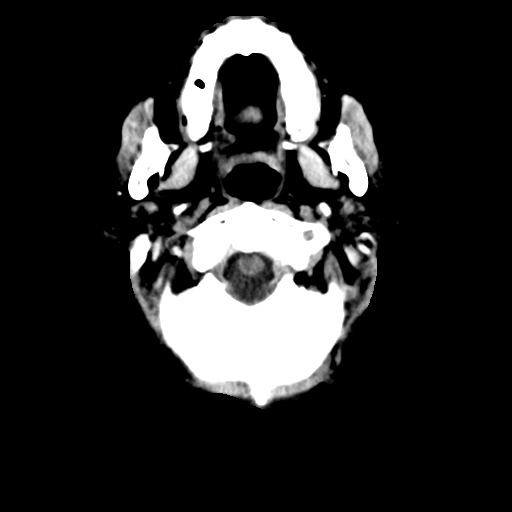
[im 5/35  bone]
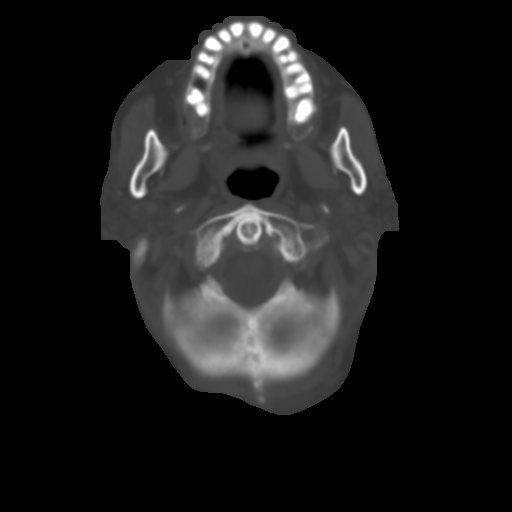
[im 9/35  brain]
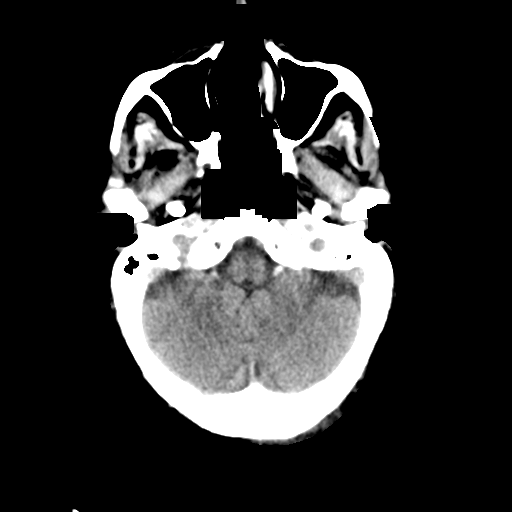
[im 13/35  brain]
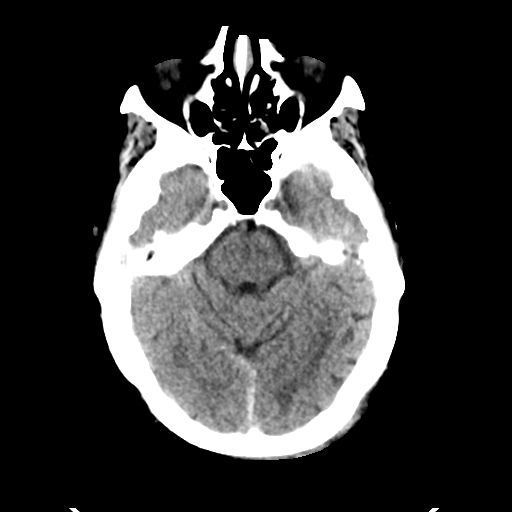
[im 18/35  brain]
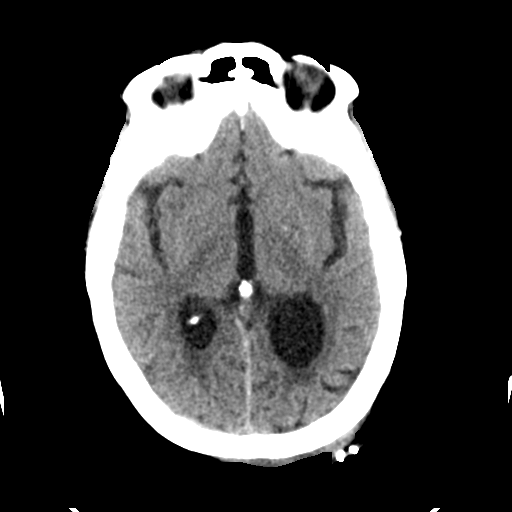
[im 22/35  brain]
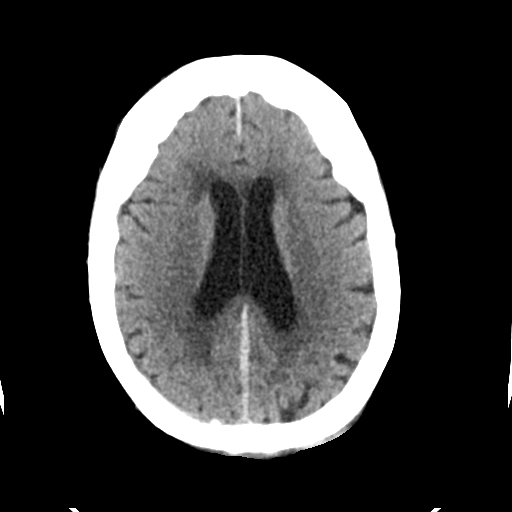
[im 22/35  bone]
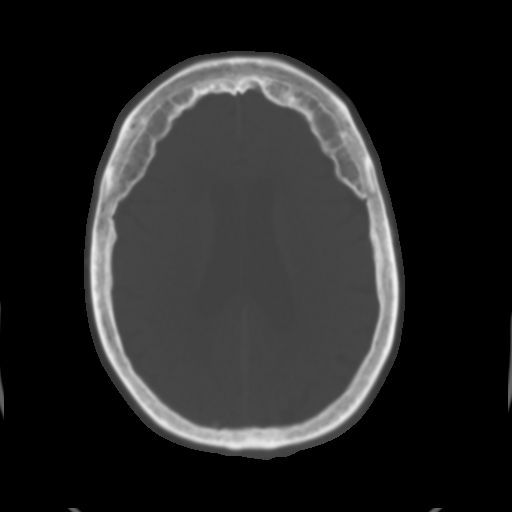
[im 26/35  brain]
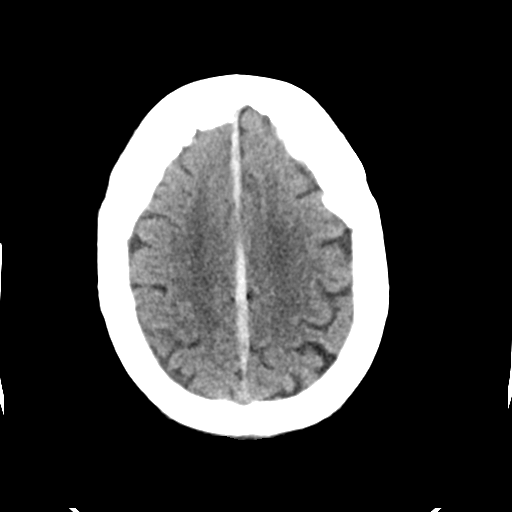
[im 30/35  brain]
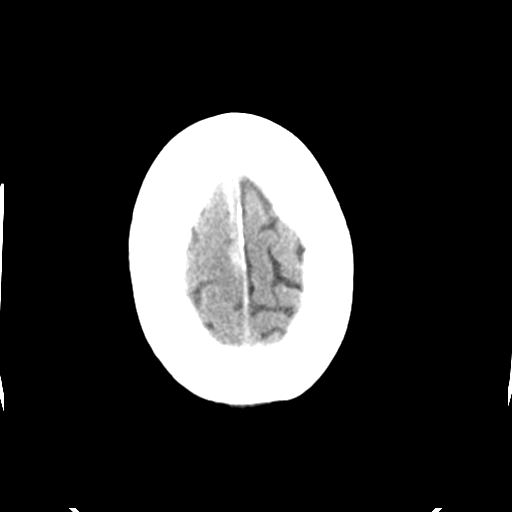

[Series 4: head bone · axial · 0.43mm/px · z∈[-80,-22]mm · 4 of 85 slices shown]
[im 9/85  bone]
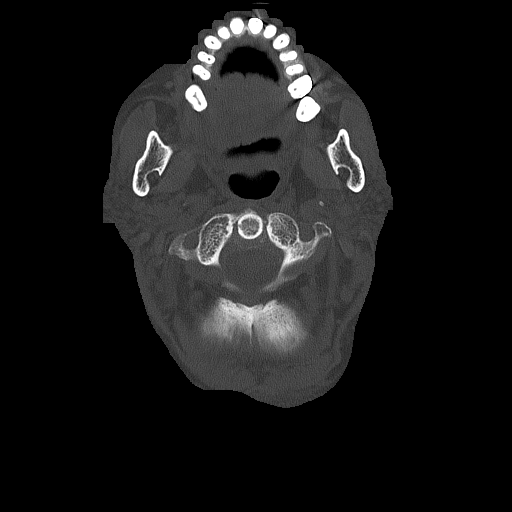
[im 17/85  bone]
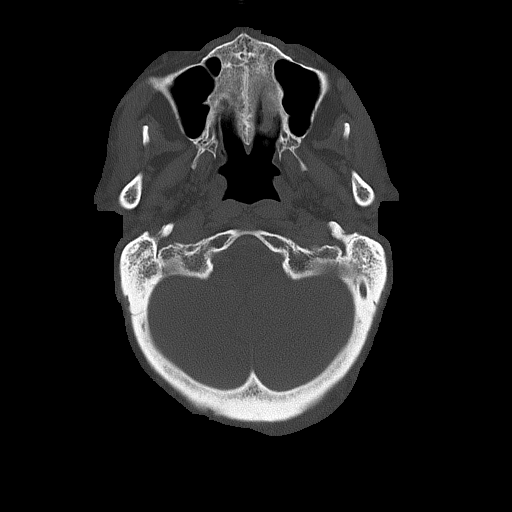
[im 26/85  bone]
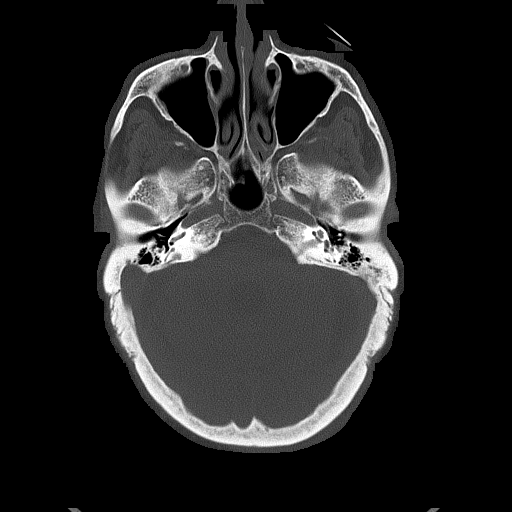
[im 38/85  bone]
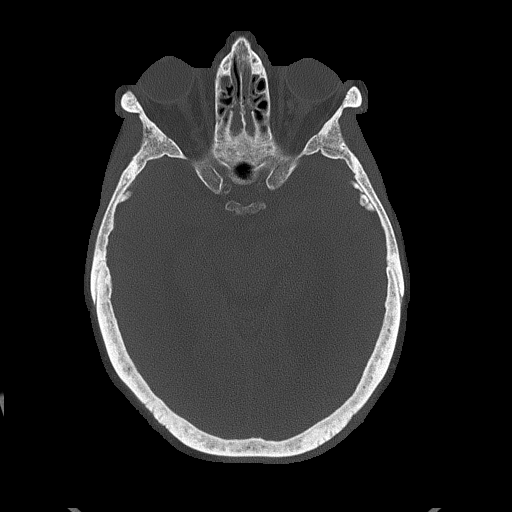

[Series 5: head without cor · coronal · non-contrast · 0.33mm/px · 3 of 66 slices shown]
[im 22/66  brain]
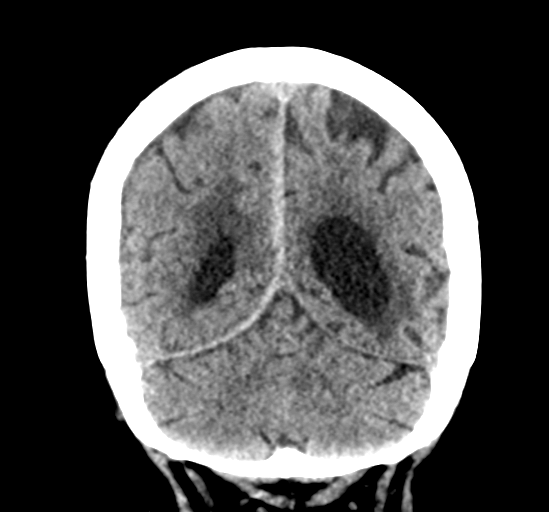
[im 29/66  brain]
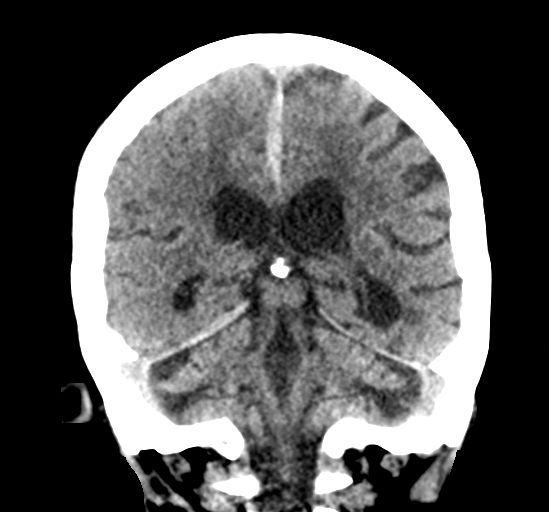
[im 37/66  brain]
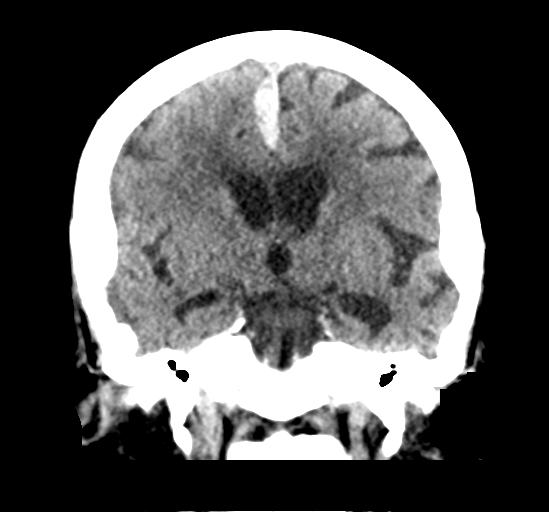

[Series 6: head without sag · sagittal · non-contrast · 0.34mm/px · 3 of 53 slices shown]
[im 18/53  brain]
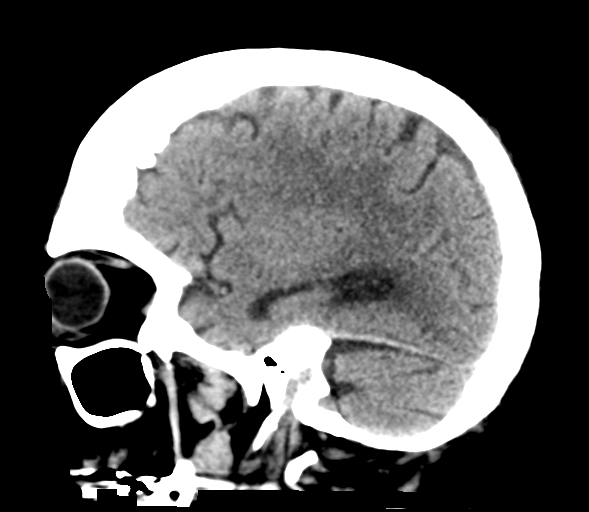
[im 27/53  brain]
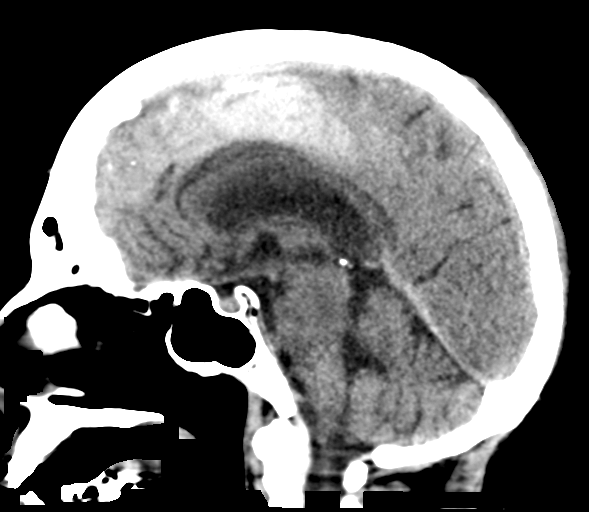
[im 35/53  brain]
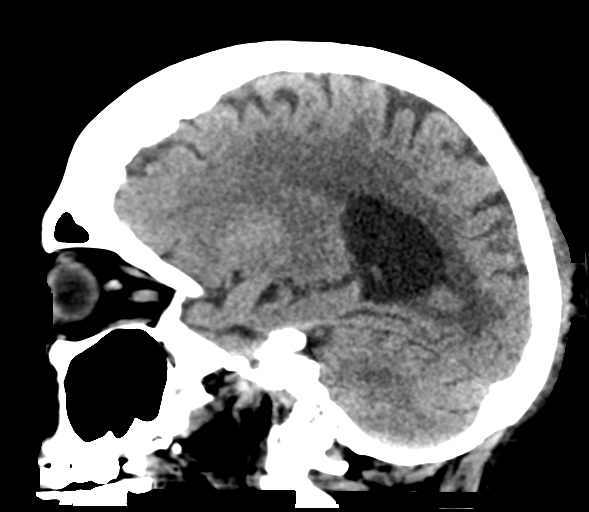

[17 of 47 positions shown; findings below may reference images not displayed]

FINDINGS: Brain: No evidence of acute infarction, hydrocephalus, or mass
lesion/mass effect.

Relatively similar size and appearance of parafalcine and tentorial
extra-axial collection, accounting for mild redistribution,
measuring up to 0.9 cm at the level of the falx cerebri (previously
0.9 cm when remeasured.

Vascular: No hyperdense vessel or unexpected calcification.

Skull: Normal. Negative for fracture or focal lesion. Incidental
hyperostosis frontalis.

Sinuses/Orbits: No acute finding. Mild paranasal sinus mucosal
thickening.

Other: Repaired LEFT posterior scalp laceration, with trace
subgaleal hematoma.
IMPRESSION: Since CT head dated 02/02/2021 at 7138 hours;

1. No new or enlarging intracranial hemorrhage.
2. Similar size and appearance of subdural hematoma along falx and
tentorium, measuring up to 0.9 cm.
3. Repair of LEFT posterior scalp laceration, with trace subgaleal
hematoma.

## 2022-03-20 DIAGNOSIS — G301 Alzheimer's disease with late onset: Secondary | ICD-10-CM | POA: Diagnosis not present

## 2022-03-20 DIAGNOSIS — I119 Hypertensive heart disease without heart failure: Secondary | ICD-10-CM | POA: Diagnosis not present

## 2022-03-20 DIAGNOSIS — F03918 Unspecified dementia, unspecified severity, with other behavioral disturbance: Secondary | ICD-10-CM | POA: Diagnosis not present

## 2022-03-20 DIAGNOSIS — I10 Essential (primary) hypertension: Secondary | ICD-10-CM | POA: Diagnosis not present

## 2022-03-20 DIAGNOSIS — N183 Chronic kidney disease, stage 3 unspecified: Secondary | ICD-10-CM | POA: Diagnosis not present

## 2022-03-21 DIAGNOSIS — N183 Chronic kidney disease, stage 3 unspecified: Secondary | ICD-10-CM | POA: Diagnosis not present

## 2022-03-21 DIAGNOSIS — I119 Hypertensive heart disease without heart failure: Secondary | ICD-10-CM | POA: Diagnosis not present

## 2022-03-21 DIAGNOSIS — M159 Polyosteoarthritis, unspecified: Secondary | ICD-10-CM | POA: Diagnosis not present

## 2022-03-21 DIAGNOSIS — F339 Major depressive disorder, recurrent, unspecified: Secondary | ICD-10-CM | POA: Diagnosis not present

## 2022-03-21 DIAGNOSIS — G301 Alzheimer's disease with late onset: Secondary | ICD-10-CM | POA: Diagnosis not present

## 2022-03-21 DIAGNOSIS — F03918 Unspecified dementia, unspecified severity, with other behavioral disturbance: Secondary | ICD-10-CM | POA: Diagnosis not present

## 2022-03-21 DIAGNOSIS — I10 Essential (primary) hypertension: Secondary | ICD-10-CM | POA: Diagnosis not present

## 2022-03-22 DIAGNOSIS — D508 Other iron deficiency anemias: Secondary | ICD-10-CM | POA: Diagnosis not present

## 2022-03-22 DIAGNOSIS — N183 Chronic kidney disease, stage 3 unspecified: Secondary | ICD-10-CM | POA: Diagnosis not present

## 2022-03-22 DIAGNOSIS — G301 Alzheimer's disease with late onset: Secondary | ICD-10-CM | POA: Diagnosis not present

## 2022-03-22 DIAGNOSIS — F03918 Unspecified dementia, unspecified severity, with other behavioral disturbance: Secondary | ICD-10-CM | POA: Diagnosis not present

## 2022-03-22 DIAGNOSIS — I10 Essential (primary) hypertension: Secondary | ICD-10-CM | POA: Diagnosis not present

## 2022-03-22 DIAGNOSIS — I119 Hypertensive heart disease without heart failure: Secondary | ICD-10-CM | POA: Diagnosis not present

## 2022-03-22 DIAGNOSIS — M150 Primary generalized (osteo)arthritis: Secondary | ICD-10-CM | POA: Diagnosis not present

## 2022-03-22 DIAGNOSIS — F331 Major depressive disorder, recurrent, moderate: Secondary | ICD-10-CM | POA: Diagnosis not present

## 2022-03-23 DIAGNOSIS — I10 Essential (primary) hypertension: Secondary | ICD-10-CM | POA: Diagnosis not present

## 2022-03-23 DIAGNOSIS — I119 Hypertensive heart disease without heart failure: Secondary | ICD-10-CM | POA: Diagnosis not present

## 2022-03-23 DIAGNOSIS — N183 Chronic kidney disease, stage 3 unspecified: Secondary | ICD-10-CM | POA: Diagnosis not present

## 2022-03-23 DIAGNOSIS — G301 Alzheimer's disease with late onset: Secondary | ICD-10-CM | POA: Diagnosis not present

## 2022-03-23 DIAGNOSIS — F03918 Unspecified dementia, unspecified severity, with other behavioral disturbance: Secondary | ICD-10-CM | POA: Diagnosis not present

## 2022-03-27 DIAGNOSIS — F03918 Unspecified dementia, unspecified severity, with other behavioral disturbance: Secondary | ICD-10-CM | POA: Diagnosis not present

## 2022-03-27 DIAGNOSIS — I119 Hypertensive heart disease without heart failure: Secondary | ICD-10-CM | POA: Diagnosis not present

## 2022-03-27 DIAGNOSIS — G301 Alzheimer's disease with late onset: Secondary | ICD-10-CM | POA: Diagnosis not present

## 2022-03-27 DIAGNOSIS — N183 Chronic kidney disease, stage 3 unspecified: Secondary | ICD-10-CM | POA: Diagnosis not present

## 2022-03-27 DIAGNOSIS — I10 Essential (primary) hypertension: Secondary | ICD-10-CM | POA: Diagnosis not present

## 2022-03-29 DIAGNOSIS — F5101 Primary insomnia: Secondary | ICD-10-CM | POA: Diagnosis not present

## 2022-03-29 DIAGNOSIS — G301 Alzheimer's disease with late onset: Secondary | ICD-10-CM | POA: Diagnosis not present

## 2022-03-29 DIAGNOSIS — I10 Essential (primary) hypertension: Secondary | ICD-10-CM | POA: Diagnosis not present

## 2022-03-29 DIAGNOSIS — I129 Hypertensive chronic kidney disease with stage 1 through stage 4 chronic kidney disease, or unspecified chronic kidney disease: Secondary | ICD-10-CM | POA: Diagnosis not present

## 2022-03-29 DIAGNOSIS — N1832 Chronic kidney disease, stage 3b: Secondary | ICD-10-CM | POA: Diagnosis not present

## 2022-03-29 DIAGNOSIS — F02B3 Dementia in other diseases classified elsewhere, moderate, with mood disturbance: Secondary | ICD-10-CM | POA: Diagnosis not present

## 2022-03-29 DIAGNOSIS — I119 Hypertensive heart disease without heart failure: Secondary | ICD-10-CM | POA: Diagnosis not present

## 2022-03-29 DIAGNOSIS — N183 Chronic kidney disease, stage 3 unspecified: Secondary | ICD-10-CM | POA: Diagnosis not present

## 2022-03-29 DIAGNOSIS — F03918 Unspecified dementia, unspecified severity, with other behavioral disturbance: Secondary | ICD-10-CM | POA: Diagnosis not present

## 2022-03-31 DIAGNOSIS — F03918 Unspecified dementia, unspecified severity, with other behavioral disturbance: Secondary | ICD-10-CM | POA: Diagnosis not present

## 2022-03-31 DIAGNOSIS — I119 Hypertensive heart disease without heart failure: Secondary | ICD-10-CM | POA: Diagnosis not present

## 2022-03-31 DIAGNOSIS — G301 Alzheimer's disease with late onset: Secondary | ICD-10-CM | POA: Diagnosis not present

## 2022-03-31 DIAGNOSIS — I10 Essential (primary) hypertension: Secondary | ICD-10-CM | POA: Diagnosis not present

## 2022-03-31 DIAGNOSIS — N183 Chronic kidney disease, stage 3 unspecified: Secondary | ICD-10-CM | POA: Diagnosis not present

## 2022-04-03 DIAGNOSIS — F03918 Unspecified dementia, unspecified severity, with other behavioral disturbance: Secondary | ICD-10-CM | POA: Diagnosis not present

## 2022-04-03 DIAGNOSIS — I119 Hypertensive heart disease without heart failure: Secondary | ICD-10-CM | POA: Diagnosis not present

## 2022-04-03 DIAGNOSIS — G301 Alzheimer's disease with late onset: Secondary | ICD-10-CM | POA: Diagnosis not present

## 2022-04-03 DIAGNOSIS — N183 Chronic kidney disease, stage 3 unspecified: Secondary | ICD-10-CM | POA: Diagnosis not present

## 2022-04-03 DIAGNOSIS — I10 Essential (primary) hypertension: Secondary | ICD-10-CM | POA: Diagnosis not present

## 2022-04-05 DIAGNOSIS — N183 Chronic kidney disease, stage 3 unspecified: Secondary | ICD-10-CM | POA: Diagnosis not present

## 2022-04-05 DIAGNOSIS — F03918 Unspecified dementia, unspecified severity, with other behavioral disturbance: Secondary | ICD-10-CM | POA: Diagnosis not present

## 2022-04-05 DIAGNOSIS — G301 Alzheimer's disease with late onset: Secondary | ICD-10-CM | POA: Diagnosis not present

## 2022-04-05 DIAGNOSIS — I10 Essential (primary) hypertension: Secondary | ICD-10-CM | POA: Diagnosis not present

## 2022-04-05 DIAGNOSIS — I119 Hypertensive heart disease without heart failure: Secondary | ICD-10-CM | POA: Diagnosis not present

## 2022-04-06 DIAGNOSIS — F03918 Unspecified dementia, unspecified severity, with other behavioral disturbance: Secondary | ICD-10-CM | POA: Diagnosis not present

## 2022-04-06 DIAGNOSIS — N183 Chronic kidney disease, stage 3 unspecified: Secondary | ICD-10-CM | POA: Diagnosis not present

## 2022-04-06 DIAGNOSIS — G301 Alzheimer's disease with late onset: Secondary | ICD-10-CM | POA: Diagnosis not present

## 2022-04-06 DIAGNOSIS — I119 Hypertensive heart disease without heart failure: Secondary | ICD-10-CM | POA: Diagnosis not present

## 2022-04-06 DIAGNOSIS — I10 Essential (primary) hypertension: Secondary | ICD-10-CM | POA: Diagnosis not present

## 2022-04-10 DIAGNOSIS — N183 Chronic kidney disease, stage 3 unspecified: Secondary | ICD-10-CM | POA: Diagnosis not present

## 2022-04-10 DIAGNOSIS — F03918 Unspecified dementia, unspecified severity, with other behavioral disturbance: Secondary | ICD-10-CM | POA: Diagnosis not present

## 2022-04-10 DIAGNOSIS — G301 Alzheimer's disease with late onset: Secondary | ICD-10-CM | POA: Diagnosis not present

## 2022-04-10 DIAGNOSIS — I119 Hypertensive heart disease without heart failure: Secondary | ICD-10-CM | POA: Diagnosis not present

## 2022-04-10 DIAGNOSIS — I10 Essential (primary) hypertension: Secondary | ICD-10-CM | POA: Diagnosis not present

## 2022-04-11 DIAGNOSIS — G301 Alzheimer's disease with late onset: Secondary | ICD-10-CM | POA: Diagnosis not present

## 2022-04-11 DIAGNOSIS — I119 Hypertensive heart disease without heart failure: Secondary | ICD-10-CM | POA: Diagnosis not present

## 2022-04-11 DIAGNOSIS — I129 Hypertensive chronic kidney disease with stage 1 through stage 4 chronic kidney disease, or unspecified chronic kidney disease: Secondary | ICD-10-CM | POA: Diagnosis not present

## 2022-04-11 DIAGNOSIS — I10 Essential (primary) hypertension: Secondary | ICD-10-CM | POA: Diagnosis not present

## 2022-04-11 DIAGNOSIS — F02818 Dementia in other diseases classified elsewhere, unspecified severity, with other behavioral disturbance: Secondary | ICD-10-CM | POA: Diagnosis not present

## 2022-04-11 DIAGNOSIS — F331 Major depressive disorder, recurrent, moderate: Secondary | ICD-10-CM | POA: Diagnosis not present

## 2022-04-11 DIAGNOSIS — N183 Chronic kidney disease, stage 3 unspecified: Secondary | ICD-10-CM | POA: Diagnosis not present

## 2022-04-11 DIAGNOSIS — F03918 Unspecified dementia, unspecified severity, with other behavioral disturbance: Secondary | ICD-10-CM | POA: Diagnosis not present

## 2022-04-11 DIAGNOSIS — N1832 Chronic kidney disease, stage 3b: Secondary | ICD-10-CM | POA: Diagnosis not present

## 2022-04-12 DIAGNOSIS — N183 Chronic kidney disease, stage 3 unspecified: Secondary | ICD-10-CM | POA: Diagnosis not present

## 2022-04-12 DIAGNOSIS — G301 Alzheimer's disease with late onset: Secondary | ICD-10-CM | POA: Diagnosis not present

## 2022-04-12 DIAGNOSIS — F03918 Unspecified dementia, unspecified severity, with other behavioral disturbance: Secondary | ICD-10-CM | POA: Diagnosis not present

## 2022-04-12 DIAGNOSIS — I119 Hypertensive heart disease without heart failure: Secondary | ICD-10-CM | POA: Diagnosis not present

## 2022-04-12 DIAGNOSIS — I10 Essential (primary) hypertension: Secondary | ICD-10-CM | POA: Diagnosis not present

## 2022-04-17 DIAGNOSIS — F03918 Unspecified dementia, unspecified severity, with other behavioral disturbance: Secondary | ICD-10-CM | POA: Diagnosis not present

## 2022-04-17 DIAGNOSIS — G301 Alzheimer's disease with late onset: Secondary | ICD-10-CM | POA: Diagnosis not present

## 2022-04-17 DIAGNOSIS — N183 Chronic kidney disease, stage 3 unspecified: Secondary | ICD-10-CM | POA: Diagnosis not present

## 2022-04-17 DIAGNOSIS — I119 Hypertensive heart disease without heart failure: Secondary | ICD-10-CM | POA: Diagnosis not present

## 2022-04-17 DIAGNOSIS — I10 Essential (primary) hypertension: Secondary | ICD-10-CM | POA: Diagnosis not present

## 2022-04-18 DIAGNOSIS — F339 Major depressive disorder, recurrent, unspecified: Secondary | ICD-10-CM | POA: Diagnosis not present

## 2022-04-18 DIAGNOSIS — M159 Polyosteoarthritis, unspecified: Secondary | ICD-10-CM | POA: Diagnosis not present

## 2022-04-19 DIAGNOSIS — N1832 Chronic kidney disease, stage 3b: Secondary | ICD-10-CM | POA: Diagnosis not present

## 2022-04-19 DIAGNOSIS — M150 Primary generalized (osteo)arthritis: Secondary | ICD-10-CM | POA: Diagnosis not present

## 2022-04-19 DIAGNOSIS — I129 Hypertensive chronic kidney disease with stage 1 through stage 4 chronic kidney disease, or unspecified chronic kidney disease: Secondary | ICD-10-CM | POA: Diagnosis not present

## 2022-04-19 DIAGNOSIS — I119 Hypertensive heart disease without heart failure: Secondary | ICD-10-CM | POA: Diagnosis not present

## 2022-04-19 DIAGNOSIS — G301 Alzheimer's disease with late onset: Secondary | ICD-10-CM | POA: Diagnosis not present

## 2022-04-19 DIAGNOSIS — F03B3 Unspecified dementia, moderate, with mood disturbance: Secondary | ICD-10-CM | POA: Diagnosis not present

## 2022-04-19 DIAGNOSIS — I10 Essential (primary) hypertension: Secondary | ICD-10-CM | POA: Diagnosis not present

## 2022-04-19 DIAGNOSIS — N183 Chronic kidney disease, stage 3 unspecified: Secondary | ICD-10-CM | POA: Diagnosis not present

## 2022-04-19 DIAGNOSIS — F03918 Unspecified dementia, unspecified severity, with other behavioral disturbance: Secondary | ICD-10-CM | POA: Diagnosis not present

## 2022-04-21 DIAGNOSIS — N183 Chronic kidney disease, stage 3 unspecified: Secondary | ICD-10-CM | POA: Diagnosis not present

## 2022-04-21 DIAGNOSIS — F03918 Unspecified dementia, unspecified severity, with other behavioral disturbance: Secondary | ICD-10-CM | POA: Diagnosis not present

## 2022-04-21 DIAGNOSIS — I10 Essential (primary) hypertension: Secondary | ICD-10-CM | POA: Diagnosis not present

## 2022-04-21 DIAGNOSIS — I119 Hypertensive heart disease without heart failure: Secondary | ICD-10-CM | POA: Diagnosis not present

## 2022-04-21 DIAGNOSIS — G301 Alzheimer's disease with late onset: Secondary | ICD-10-CM | POA: Diagnosis not present

## 2022-04-24 DIAGNOSIS — G301 Alzheimer's disease with late onset: Secondary | ICD-10-CM | POA: Diagnosis not present

## 2022-04-24 DIAGNOSIS — F03918 Unspecified dementia, unspecified severity, with other behavioral disturbance: Secondary | ICD-10-CM | POA: Diagnosis not present

## 2022-04-24 DIAGNOSIS — I10 Essential (primary) hypertension: Secondary | ICD-10-CM | POA: Diagnosis not present

## 2022-04-24 DIAGNOSIS — I119 Hypertensive heart disease without heart failure: Secondary | ICD-10-CM | POA: Diagnosis not present

## 2022-04-24 DIAGNOSIS — N183 Chronic kidney disease, stage 3 unspecified: Secondary | ICD-10-CM | POA: Diagnosis not present

## 2022-04-25 DIAGNOSIS — I119 Hypertensive heart disease without heart failure: Secondary | ICD-10-CM | POA: Diagnosis not present

## 2022-04-25 DIAGNOSIS — G301 Alzheimer's disease with late onset: Secondary | ICD-10-CM | POA: Diagnosis not present

## 2022-04-25 DIAGNOSIS — I10 Essential (primary) hypertension: Secondary | ICD-10-CM | POA: Diagnosis not present

## 2022-04-25 DIAGNOSIS — N183 Chronic kidney disease, stage 3 unspecified: Secondary | ICD-10-CM | POA: Diagnosis not present

## 2022-04-25 DIAGNOSIS — F03918 Unspecified dementia, unspecified severity, with other behavioral disturbance: Secondary | ICD-10-CM | POA: Diagnosis not present

## 2022-04-26 DIAGNOSIS — F5101 Primary insomnia: Secondary | ICD-10-CM | POA: Diagnosis not present

## 2022-04-26 DIAGNOSIS — R7401 Elevation of levels of liver transaminase levels: Secondary | ICD-10-CM | POA: Diagnosis not present

## 2022-04-26 DIAGNOSIS — N1832 Chronic kidney disease, stage 3b: Secondary | ICD-10-CM | POA: Diagnosis not present

## 2022-04-26 DIAGNOSIS — I129 Hypertensive chronic kidney disease with stage 1 through stage 4 chronic kidney disease, or unspecified chronic kidney disease: Secondary | ICD-10-CM | POA: Diagnosis not present

## 2022-04-27 DIAGNOSIS — G301 Alzheimer's disease with late onset: Secondary | ICD-10-CM | POA: Diagnosis not present

## 2022-04-27 DIAGNOSIS — I119 Hypertensive heart disease without heart failure: Secondary | ICD-10-CM | POA: Diagnosis not present

## 2022-04-27 DIAGNOSIS — N183 Chronic kidney disease, stage 3 unspecified: Secondary | ICD-10-CM | POA: Diagnosis not present

## 2022-04-27 DIAGNOSIS — F03918 Unspecified dementia, unspecified severity, with other behavioral disturbance: Secondary | ICD-10-CM | POA: Diagnosis not present

## 2022-04-27 DIAGNOSIS — I10 Essential (primary) hypertension: Secondary | ICD-10-CM | POA: Diagnosis not present

## 2022-04-28 DIAGNOSIS — N183 Chronic kidney disease, stage 3 unspecified: Secondary | ICD-10-CM | POA: Diagnosis not present

## 2022-04-28 DIAGNOSIS — I10 Essential (primary) hypertension: Secondary | ICD-10-CM | POA: Diagnosis not present

## 2022-04-28 DIAGNOSIS — G301 Alzheimer's disease with late onset: Secondary | ICD-10-CM | POA: Diagnosis not present

## 2022-04-28 DIAGNOSIS — F03918 Unspecified dementia, unspecified severity, with other behavioral disturbance: Secondary | ICD-10-CM | POA: Diagnosis not present

## 2022-04-28 DIAGNOSIS — I119 Hypertensive heart disease without heart failure: Secondary | ICD-10-CM | POA: Diagnosis not present

## 2022-04-30 DIAGNOSIS — G301 Alzheimer's disease with late onset: Secondary | ICD-10-CM | POA: Diagnosis not present

## 2022-04-30 DIAGNOSIS — I119 Hypertensive heart disease without heart failure: Secondary | ICD-10-CM | POA: Diagnosis not present

## 2022-04-30 DIAGNOSIS — I10 Essential (primary) hypertension: Secondary | ICD-10-CM | POA: Diagnosis not present

## 2022-04-30 DIAGNOSIS — F03918 Unspecified dementia, unspecified severity, with other behavioral disturbance: Secondary | ICD-10-CM | POA: Diagnosis not present

## 2022-04-30 DIAGNOSIS — N183 Chronic kidney disease, stage 3 unspecified: Secondary | ICD-10-CM | POA: Diagnosis not present

## 2022-05-01 DIAGNOSIS — F03918 Unspecified dementia, unspecified severity, with other behavioral disturbance: Secondary | ICD-10-CM | POA: Diagnosis not present

## 2022-05-01 DIAGNOSIS — I10 Essential (primary) hypertension: Secondary | ICD-10-CM | POA: Diagnosis not present

## 2022-05-01 DIAGNOSIS — N183 Chronic kidney disease, stage 3 unspecified: Secondary | ICD-10-CM | POA: Diagnosis not present

## 2022-05-01 DIAGNOSIS — G301 Alzheimer's disease with late onset: Secondary | ICD-10-CM | POA: Diagnosis not present

## 2022-05-01 DIAGNOSIS — I119 Hypertensive heart disease without heart failure: Secondary | ICD-10-CM | POA: Diagnosis not present

## 2022-05-03 DIAGNOSIS — N183 Chronic kidney disease, stage 3 unspecified: Secondary | ICD-10-CM | POA: Diagnosis not present

## 2022-05-03 DIAGNOSIS — I119 Hypertensive heart disease without heart failure: Secondary | ICD-10-CM | POA: Diagnosis not present

## 2022-05-03 DIAGNOSIS — F03918 Unspecified dementia, unspecified severity, with other behavioral disturbance: Secondary | ICD-10-CM | POA: Diagnosis not present

## 2022-05-03 DIAGNOSIS — I10 Essential (primary) hypertension: Secondary | ICD-10-CM | POA: Diagnosis not present

## 2022-05-03 DIAGNOSIS — G301 Alzheimer's disease with late onset: Secondary | ICD-10-CM | POA: Diagnosis not present

## 2022-05-04 DIAGNOSIS — F03918 Unspecified dementia, unspecified severity, with other behavioral disturbance: Secondary | ICD-10-CM | POA: Diagnosis not present

## 2022-05-04 DIAGNOSIS — I10 Essential (primary) hypertension: Secondary | ICD-10-CM | POA: Diagnosis not present

## 2022-05-04 DIAGNOSIS — I119 Hypertensive heart disease without heart failure: Secondary | ICD-10-CM | POA: Diagnosis not present

## 2022-05-04 DIAGNOSIS — G301 Alzheimer's disease with late onset: Secondary | ICD-10-CM | POA: Diagnosis not present

## 2022-05-04 DIAGNOSIS — N183 Chronic kidney disease, stage 3 unspecified: Secondary | ICD-10-CM | POA: Diagnosis not present

## 2022-05-08 DIAGNOSIS — I119 Hypertensive heart disease without heart failure: Secondary | ICD-10-CM | POA: Diagnosis not present

## 2022-05-08 DIAGNOSIS — F03918 Unspecified dementia, unspecified severity, with other behavioral disturbance: Secondary | ICD-10-CM | POA: Diagnosis not present

## 2022-05-08 DIAGNOSIS — G301 Alzheimer's disease with late onset: Secondary | ICD-10-CM | POA: Diagnosis not present

## 2022-05-08 DIAGNOSIS — I10 Essential (primary) hypertension: Secondary | ICD-10-CM | POA: Diagnosis not present

## 2022-05-08 DIAGNOSIS — N183 Chronic kidney disease, stage 3 unspecified: Secondary | ICD-10-CM | POA: Diagnosis not present

## 2022-05-09 DIAGNOSIS — N183 Chronic kidney disease, stage 3 unspecified: Secondary | ICD-10-CM | POA: Diagnosis not present

## 2022-05-09 DIAGNOSIS — I119 Hypertensive heart disease without heart failure: Secondary | ICD-10-CM | POA: Diagnosis not present

## 2022-05-09 DIAGNOSIS — F03918 Unspecified dementia, unspecified severity, with other behavioral disturbance: Secondary | ICD-10-CM | POA: Diagnosis not present

## 2022-05-09 DIAGNOSIS — I10 Essential (primary) hypertension: Secondary | ICD-10-CM | POA: Diagnosis not present

## 2022-05-09 DIAGNOSIS — G301 Alzheimer's disease with late onset: Secondary | ICD-10-CM | POA: Diagnosis not present

## 2022-05-10 DIAGNOSIS — I119 Hypertensive heart disease without heart failure: Secondary | ICD-10-CM | POA: Diagnosis not present

## 2022-05-10 DIAGNOSIS — I129 Hypertensive chronic kidney disease with stage 1 through stage 4 chronic kidney disease, or unspecified chronic kidney disease: Secondary | ICD-10-CM | POA: Diagnosis not present

## 2022-05-10 DIAGNOSIS — F5101 Primary insomnia: Secondary | ICD-10-CM | POA: Diagnosis not present

## 2022-05-10 DIAGNOSIS — F03918 Unspecified dementia, unspecified severity, with other behavioral disturbance: Secondary | ICD-10-CM | POA: Diagnosis not present

## 2022-05-10 DIAGNOSIS — N1832 Chronic kidney disease, stage 3b: Secondary | ICD-10-CM | POA: Diagnosis not present

## 2022-05-10 DIAGNOSIS — I10 Essential (primary) hypertension: Secondary | ICD-10-CM | POA: Diagnosis not present

## 2022-05-10 DIAGNOSIS — G301 Alzheimer's disease with late onset: Secondary | ICD-10-CM | POA: Diagnosis not present

## 2022-05-10 DIAGNOSIS — N183 Chronic kidney disease, stage 3 unspecified: Secondary | ICD-10-CM | POA: Diagnosis not present

## 2022-05-10 DIAGNOSIS — M159 Polyosteoarthritis, unspecified: Secondary | ICD-10-CM | POA: Diagnosis not present

## 2022-05-11 DIAGNOSIS — F03918 Unspecified dementia, unspecified severity, with other behavioral disturbance: Secondary | ICD-10-CM | POA: Diagnosis not present

## 2022-05-11 DIAGNOSIS — N183 Chronic kidney disease, stage 3 unspecified: Secondary | ICD-10-CM | POA: Diagnosis not present

## 2022-05-11 DIAGNOSIS — I10 Essential (primary) hypertension: Secondary | ICD-10-CM | POA: Diagnosis not present

## 2022-05-11 DIAGNOSIS — G301 Alzheimer's disease with late onset: Secondary | ICD-10-CM | POA: Diagnosis not present

## 2022-05-11 DIAGNOSIS — I119 Hypertensive heart disease without heart failure: Secondary | ICD-10-CM | POA: Diagnosis not present

## 2022-05-12 DIAGNOSIS — G301 Alzheimer's disease with late onset: Secondary | ICD-10-CM | POA: Diagnosis not present

## 2022-05-12 DIAGNOSIS — I10 Essential (primary) hypertension: Secondary | ICD-10-CM | POA: Diagnosis not present

## 2022-05-12 DIAGNOSIS — N183 Chronic kidney disease, stage 3 unspecified: Secondary | ICD-10-CM | POA: Diagnosis not present

## 2022-05-12 DIAGNOSIS — I119 Hypertensive heart disease without heart failure: Secondary | ICD-10-CM | POA: Diagnosis not present

## 2022-05-12 DIAGNOSIS — F03918 Unspecified dementia, unspecified severity, with other behavioral disturbance: Secondary | ICD-10-CM | POA: Diagnosis not present

## 2022-05-17 DIAGNOSIS — F03918 Unspecified dementia, unspecified severity, with other behavioral disturbance: Secondary | ICD-10-CM | POA: Diagnosis not present

## 2022-05-17 DIAGNOSIS — N183 Chronic kidney disease, stage 3 unspecified: Secondary | ICD-10-CM | POA: Diagnosis not present

## 2022-05-17 DIAGNOSIS — I10 Essential (primary) hypertension: Secondary | ICD-10-CM | POA: Diagnosis not present

## 2022-05-17 DIAGNOSIS — G301 Alzheimer's disease with late onset: Secondary | ICD-10-CM | POA: Diagnosis not present

## 2022-05-17 DIAGNOSIS — I119 Hypertensive heart disease without heart failure: Secondary | ICD-10-CM | POA: Diagnosis not present

## 2022-05-18 DIAGNOSIS — I10 Essential (primary) hypertension: Secondary | ICD-10-CM | POA: Diagnosis not present

## 2022-05-18 DIAGNOSIS — N183 Chronic kidney disease, stage 3 unspecified: Secondary | ICD-10-CM | POA: Diagnosis not present

## 2022-05-18 DIAGNOSIS — G301 Alzheimer's disease with late onset: Secondary | ICD-10-CM | POA: Diagnosis not present

## 2022-05-18 DIAGNOSIS — I119 Hypertensive heart disease without heart failure: Secondary | ICD-10-CM | POA: Diagnosis not present

## 2022-05-18 DIAGNOSIS — F03918 Unspecified dementia, unspecified severity, with other behavioral disturbance: Secondary | ICD-10-CM | POA: Diagnosis not present

## 2022-07-10 ENCOUNTER — Emergency Department (HOSPITAL_COMMUNITY): Payer: Medicare Other

## 2022-07-10 ENCOUNTER — Emergency Department (HOSPITAL_COMMUNITY)
Admission: EM | Admit: 2022-07-10 | Discharge: 2022-07-11 | Disposition: A | Discharge status: Home / Self Care | Payer: Medicare Other | Attending: Emergency Medicine | Admitting: Emergency Medicine

## 2022-07-10 ENCOUNTER — Other Ambulatory Visit: Payer: Self-pay

## 2022-07-10 DIAGNOSIS — F0390 Unspecified dementia without behavioral disturbance: Secondary | ICD-10-CM | POA: Diagnosis not present

## 2022-07-10 DIAGNOSIS — R410 Disorientation, unspecified: Secondary | ICD-10-CM | POA: Insufficient documentation

## 2022-07-10 DIAGNOSIS — S0101XA Laceration without foreign body of scalp, initial encounter: Secondary | ICD-10-CM

## 2022-07-10 DIAGNOSIS — M25561 Pain in right knee: Secondary | ICD-10-CM | POA: Diagnosis not present

## 2022-07-10 DIAGNOSIS — E1122 Type 2 diabetes mellitus with diabetic chronic kidney disease: Secondary | ICD-10-CM | POA: Insufficient documentation

## 2022-07-10 DIAGNOSIS — W19XXXA Unspecified fall, initial encounter: Secondary | ICD-10-CM

## 2022-07-10 DIAGNOSIS — I129 Hypertensive chronic kidney disease with stage 1 through stage 4 chronic kidney disease, or unspecified chronic kidney disease: Secondary | ICD-10-CM | POA: Insufficient documentation

## 2022-07-10 DIAGNOSIS — N189 Chronic kidney disease, unspecified: Secondary | ICD-10-CM | POA: Insufficient documentation

## 2022-07-10 DIAGNOSIS — M25572 Pain in left ankle and joints of left foot: Secondary | ICD-10-CM | POA: Diagnosis not present

## 2022-07-10 DIAGNOSIS — S0990XA Unspecified injury of head, initial encounter: Secondary | ICD-10-CM | POA: Diagnosis present

## 2022-07-10 DIAGNOSIS — Z7982 Long term (current) use of aspirin: Secondary | ICD-10-CM | POA: Insufficient documentation

## 2022-07-10 DIAGNOSIS — W01198A Fall on same level from slipping, tripping and stumbling with subsequent striking against other object, initial encounter: Secondary | ICD-10-CM | POA: Diagnosis not present

## 2022-07-10 MED ORDER — LIDOCAINE-EPINEPHRINE-TETRACAINE (LET) TOPICAL GEL
3.0000 mL | Freq: Once | TOPICAL | Status: AC
  Administered 2022-07-10: 3 mL via TOPICAL
  Filled 2022-07-10: qty 3

## 2022-07-10 MED ORDER — HALOPERIDOL LACTATE 5 MG/ML IJ SOLN
2.0000 mg | Freq: Once | INTRAMUSCULAR | Status: AC
Start: 2022-07-10 — End: 2022-07-10
  Administered 2022-07-10: 2 mg via INTRAMUSCULAR
  Filled 2022-07-10: qty 1

## 2022-07-10 MED ORDER — ACETAMINOPHEN 325 MG PO TABS
650.0000 mg | ORAL_TABLET | Freq: Once | ORAL | Status: AC
  Administered 2022-07-10: 650 mg via ORAL
  Filled 2022-07-10: qty 2

## 2022-07-10 MED ORDER — HALOPERIDOL 1 MG PO TABS
2.0000 mg | ORAL_TABLET | Freq: Once | ORAL | Status: DC
  Filled 2022-07-10: qty 2

## 2022-07-10 NOTE — ED Provider Notes (Signed)
[facility] AT [facility] Provider Note   CSN: [id number] Arrival date & time: 07/10/22  1609     History {Add pertinent medical, surgical, social history, OB history to HPI:1} No chief complaint on file.   [name] is a 82 y.o. female.  HPI Patient presents for fall.  Medical history includes HLD, HTN, CKD, dementia, DM.  She presents from skilled nursing facility.  Patient had a witnessed fall.  It was described as her pulling on a piece of paper with another resident when the other resident let go.  This caused her to fall backwards.  She did strike the back of her head.  She is not on any blood thinning medications.  Patient denies any current areas of pain.    Home Medications Prior to Admission medications   Medication Sig Start Date End Date Taking? Authorizing Provider  amLODipine (NORVASC) 5 MG tablet Take 1 tablet (5 mg total) by mouth daily. 05/21/21   Janith Lima, MD  aspirin 81 MG EC tablet Take 1 tablet (81 mg total) by mouth daily. Swallow whole. 04/26/21   Janith Lima, MD  chlorhexidine gluconate, MEDLINE KIT, (PERIDEX) 0.12 % solution Use as directed 15 mLs in the mouth or throat See admin instructions. Swish 15 ml's in the mouth and hold for 2 minutes, then spit out- TWICE A DAY    [provider]  citalopram (CELEXA) 10 MG tablet Take 1 tablet (10 mg total) by mouth daily. 05/21/21   Janith Lima, MD  donepezil (ARICEPT) 10 MG tablet Take 10 mg by mouth at bedtime.    [provider]  hydrOXYzine (ATARAX) 50 MG tablet Take 50 mg by mouth at bedtime.    [provider]  hydrOXYzine (VISTARIL) 50 MG capsule Take 50 mg by mouth at bedtime.    [provider]  melatonin 3 MG TABS tablet Take 3 mg by mouth at bedtime.    [provider]  Memantine HCl-Donepezil HCl [facility]) 28-10 MG CP24 Take 1 capsule at bedtime Patient not taking: Reported on 01/17/2022 05/21/21   Janith Lima, MD  NAMENDA XR 28 MG CP24 24 hr capsule Take 28 mg by mouth at bedtime.    [provider]  propranolol (INDERAL) 20 MG tablet Take 1 tablet (20 mg total) by mouth 2 (two) times daily. 05/21/21   Janith Lima, MD  QUEtiapine (SEROQUEL) 25 MG tablet Take 1 tablet (25 mg total) by mouth 2 (two) times daily between meals as needed. 08/12/21   Janith Lima, MD  SENEXON-S 8.6-50 MG tablet Take 2 tablets by mouth at bedtime.    [provider]  traMADol (ULTRAM) 50 MG tablet Take 50 mg by mouth See admin instructions. Take 50 mg by mouth every 4-6 hours as needed for pain    [provider]      Allergies    Pneumococcal vaccines, Wound dressing adhesive, Zinc, Ampicillin, Neomycin-bacitracin zn-polymyx, and Penicillins    Review of Systems   Review of Systems  Unable to perform ROS: Dementia    Physical Exam Updated Vital Signs There were no vitals taken for this visit. Physical Exam Vitals and nursing note reviewed.  Constitutional:      General: She is not in acute distress.    Appearance: Normal appearance. She is well-developed. She is not ill-appearing, toxic-appearing or diaphoretic.  HENT:     Head: Normocephalic.     Comments: 3 cm  laceration to occiput    Right Ear: External ear normal.     Left Ear: External ear normal.     Nose: Nose normal.     Mouth/Throat:     Mouth: Mucous membranes are moist.  Eyes:     Extraocular Movements: Extraocular movements intact.     Conjunctiva/sclera: Conjunctivae normal.  Cardiovascular:     Rate and Rhythm: Normal rate and regular rhythm.     Heart sounds: No murmur heard. Pulmonary:     Effort: Pulmonary effort is normal. No respiratory distress.     Breath sounds: Normal breath sounds.  Chest:     Chest wall: No tenderness.  Abdominal:     General: There is no distension.     Palpations: Abdomen is soft.     Tenderness: There is no abdominal tenderness.  Musculoskeletal:        General:  Tenderness (Right knee and left ankle) present. No swelling or deformity.     Cervical back: Normal range of motion and neck supple.     Right lower leg: Edema present.     Left lower leg: Edema present.  Skin:    General: Skin is warm and dry.     Coloration: Skin is not jaundiced or pale.  Neurological:     General: No focal deficit present.     Mental Status: She is alert. Mental status is at baseline. She is disoriented.     Cranial Nerves: No cranial nerve deficit.     Sensory: No sensory deficit.     Motor: No weakness.     Coordination: Coordination normal.  Psychiatric:        Mood and Affect: Mood normal.        Behavior: Behavior normal.     ED Results / Procedures / Treatments   Labs (all labs ordered are listed, but only abnormal results are displayed) Labs Reviewed - No data to display  EKG None  Radiology No results found.  Procedures Procedures  {Document cardiac monitor, telemetry assessment procedure when appropriate:1}  Medications Ordered in ED Medications - No data to display  ED Course/ Medical Decision Making/ A&P   {   Click here for ABCD2, HEART and other calculatorsREFRESH Note before signing :1}                          Medical Decision Making Amount and/or Complexity of Data Reviewed Radiology: ordered. ECG/medicine tests: ordered.  Risk OTC drugs.   This patient presents to the ED for concern of ***, this involves an extensive number of treatment options, and is a complaint that carries with it a high risk of complications and morbidity.  The differential diagnosis includes ***   Co morbidities that complicate the patient evaluation  ***   Additional history obtained:  Additional history obtained from *** External records from outside source obtained and reviewed including ***   Lab Tests:  I Ordered, and personally interpreted labs.  The pertinent results include:  ***   Imaging Studies ordered:  I ordered imaging  studies including ***  I independently visualized and interpreted imaging which showed *** I agree with the radiologist interpretation   Cardiac Monitoring: / EKG:  The patient was maintained on a cardiac monitor.  I personally viewed and interpreted the cardiac monitored which showed an underlying rhythm of: ***   Consultations Obtained:  I requested consultation with the ***,  and discussed lab and imaging findings  as well as pertinent plan - they recommend: ***   Problem List / ED Course / Critical interventions / Medication management  82 year old female with history of dementia, presenting from skilled nursing facility after a witnessed fall.  Fall was described as mechanical.  During this fall, she fell backwards and struck the back of her head.  She is not on blood thinners.  On arrival in the ED, she is alert.  She is disoriented consistent with known underlying dementia.  She initially denies any areas of pain.  Her breathing is unlabored.  On secondary survey, patient does have a 3 cm laceration to her occiput.  She also endorses tenderness to her right knee and left ankle.  No deformities or swelling appreciated.  Will obtain CT imaging and x-rays.  Let gel was applied to laceration.  Tylenol was ordered for analgesia.  Per chart review, patient has received tetanus update within the last 5 years.***. I ordered medication including ***  for ***  Reevaluation of the patient after these medicines showed that the patient {resolved/improved/worsened:23923::"improved"} I have reviewed the patients home medicines and have made adjustments as needed   Social Determinants of Health:  ***   Test / Admission - Considered:  ***   {Document critical care time when appropriate:1} {Document review of labs and clinical decision tools ie heart score, Chads2Vasc2 etc:1}  {Document your independent review of radiology images, and any outside records:1} {Document your discussion with  family members, caretakers, and with consultants:1} {Document social determinants of health affecting pt's care:1} {Document your decision making why or why not admission, treatments were needed:1} Final Clinical Impression(s) / ED Diagnoses Final diagnoses:  None    Rx / DC Orders ED Discharge Orders     None

## 2022-07-10 NOTE — ED Notes (Addendum)
Patient yelling out and attempting to climb off stretecher,  cursing, staff attempted to redirected patient, not effective, MD notified.

## 2022-07-10 NOTE — ED Triage Notes (Signed)
Patient brought in by EMS from [facility] at [facility] after falling causing laceration to back of head, patient did not lose consciousness and is not on any thinners. She is alert to self, HX of dementia, patient is at baseline.  138/72 64 19 97% RA CBG: 147

## 2022-07-10 NOTE — ED Notes (Signed)
Pt still yelling at staff and throwing blankets on the floor. Pt stated she will "blow our heads off".

## 2022-07-10 NOTE — ED Notes (Signed)
Patient transported to CT 

## 2022-07-11 DIAGNOSIS — S0101XA Laceration without foreign body of scalp, initial encounter: Secondary | ICD-10-CM | POA: Diagnosis not present

## 2022-07-11 NOTE — ED Notes (Signed)
[facility] called for transportation  

## 2022-07-17 ENCOUNTER — Telehealth: Payer: Self-pay

## 2022-07-17 NOTE — Telephone Encounter (Signed)
     Patient  visit on 2/20  at [facility] you been able to follow up with your primary care physician? Yes   The patient was or was not able to obtain any needed medicine or equipment. Na   Are there diet recommendations that you are having difficulty following? Yes   Patient expresses understanding of discharge instructions and education provided has no other needs at this time.  [place], [place] [id number] [address], [place], [place] [address] Phone: [phone] Email: [email or web address]

## 2022-07-28 ENCOUNTER — Emergency Department (HOSPITAL_COMMUNITY)
Admission: EM | Admit: 2022-07-28 | Discharge: 2022-07-29 | Disposition: A | Discharge status: Home / Self Care | Payer: Medicare Other | Attending: Emergency Medicine | Admitting: Emergency Medicine

## 2022-07-28 ENCOUNTER — Other Ambulatory Visit: Payer: Self-pay

## 2022-07-28 ENCOUNTER — Encounter (HOSPITAL_COMMUNITY): Payer: Self-pay | Admitting: Emergency Medicine

## 2022-07-28 DIAGNOSIS — W19XXXA Unspecified fall, initial encounter: Secondary | ICD-10-CM | POA: Insufficient documentation

## 2022-07-28 DIAGNOSIS — I10 Essential (primary) hypertension: Secondary | ICD-10-CM | POA: Diagnosis not present

## 2022-07-28 DIAGNOSIS — S0101XA Laceration without foreign body of scalp, initial encounter: Secondary | ICD-10-CM | POA: Insufficient documentation

## 2022-07-28 DIAGNOSIS — E119 Type 2 diabetes mellitus without complications: Secondary | ICD-10-CM | POA: Insufficient documentation

## 2022-07-28 DIAGNOSIS — Z7982 Long term (current) use of aspirin: Secondary | ICD-10-CM | POA: Insufficient documentation

## 2022-07-28 DIAGNOSIS — Y92129 Unspecified place in nursing home as the place of occurrence of the external cause: Secondary | ICD-10-CM | POA: Diagnosis not present

## 2022-07-28 DIAGNOSIS — S0990XA Unspecified injury of head, initial encounter: Secondary | ICD-10-CM | POA: Diagnosis present

## 2022-07-28 DIAGNOSIS — Z79899 Other long term (current) drug therapy: Secondary | ICD-10-CM | POA: Diagnosis not present

## 2022-07-28 NOTE — ED Notes (Signed)
Son in law stated that he will be leaving soon, please call daughter at any hour to give report.  Thank you

## 2022-07-28 NOTE — ED Triage Notes (Signed)
Per EMS, pt from [facility], she had an unwittnessed fall, resulting in a laceration on the back right side of her head.  Staff unsure how long pt was on the floor.  She has a hx of dementia however staff at facility states she is "acting differently however unable to elaborate."  Placed in C-collar due to unwitnessed fall, no blood thinners documented.    173/73 Pulse 80 99% RA CBG 139  Pt is alert but not oriented at all

## 2022-07-28 NOTE — ED Notes (Signed)
Family arrived, states that pt has had several falls over the past two weeks and has "not been normal" over the past two week.  She does not recognize the family member.

## 2022-07-28 NOTE — ED Notes (Signed)
[name]

## 2022-07-29 ENCOUNTER — Emergency Department (HOSPITAL_COMMUNITY): Payer: Medicare Other

## 2022-07-29 DIAGNOSIS — S0101XA Laceration without foreign body of scalp, initial encounter: Secondary | ICD-10-CM | POA: Diagnosis not present

## 2022-07-29 NOTE — ED Notes (Signed)
Discharge instructions discussed with Sidaq at [facility]. Verbalized understanding. VSS. No questions or concerns regarding discharge. Waiting on PTAR

## 2022-07-29 NOTE — ED Notes (Addendum)
Hospice RN Cythina, called for updated.

## 2022-07-29 NOTE — ED Provider Notes (Signed)
[facility] Provider Note   CSN: [id number] Arrival date & time: 07/28/22  2206     History  Chief Complaint  Patient presents with   Lytle Michaels    [name] is a 82 y.o. female.  The history is provided by the nursing home and the EMS personnel. The history is limited by the condition of the patient (Dementia).  Fall  She has history of hypertension, diabetes, gout and was sent in from a skilled nursing facility following an unwitnessed fall.  She suffered a laceration to her right occiput.  She is reported to be acting differently, but patient is not able to give any history.  She does states that she does not want to be examined.   Home Medications Prior to Admission medications   Medication Sig Start Date End Date Taking? Authorizing Provider  amLODipine (NORVASC) 5 MG tablet Take 1 tablet (5 mg total) by mouth daily. 05/21/21   Janith Lima, MD  aspirin 81 MG EC tablet Take 1 tablet (81 mg total) by mouth daily. Swallow whole. 04/26/21   Janith Lima, MD  chlorhexidine gluconate, MEDLINE KIT, (PERIDEX) 0.12 % solution Use as directed 15 mLs in the mouth or throat See admin instructions. Swish 15 ml's in the mouth and hold for 2 minutes, then spit out- TWICE A DAY    [provider]  citalopram (CELEXA) 10 MG tablet Take 1 tablet (10 mg total) by mouth daily. 05/21/21   Janith Lima, MD  donepezil (ARICEPT) 10 MG tablet Take 10 mg by mouth at bedtime.    [provider]  haloperidol (HALDOL) 1 MG tablet Take 1 mg by mouth See admin instructions. Take 1 mg by mouth as needed for agitation, 45 minutes before completing ADL care    [provider]  hydrOXYzine (VISTARIL) 25 MG capsule Take 25 mg by mouth in the morning and at bedtime.    [provider]  melatonin 3 MG TABS tablet Take 3 mg by mouth at bedtime.    [provider]  Memantine HCl-Donepezil HCl [facility]) 28-10 MG CP24 Take 1  capsule at bedtime Patient not taking: Reported on 01/17/2022 05/21/21   Janith Lima, MD  NAMENDA XR 28 MG CP24 24 hr capsule Take 28 mg by mouth at bedtime.    [provider]  propranolol (INDERAL) 20 MG tablet Take 1 tablet (20 mg total) by mouth 2 (two) times daily. 05/21/21   Janith Lima, MD  QUEtiapine (SEROQUEL) 25 MG tablet Take 1 tablet (25 mg total) by mouth 2 (two) times daily between meals as needed. Patient taking differently: Take 25 mg by mouth 2 (two) times daily between meals as needed (for behavioral disturbances). 08/12/21   Janith Lima, MD  QUEtiapine (SEROQUEL) 50 MG tablet Take 50 mg by mouth every 12 (twelve) hours.    [provider]  SENEXON-S 8.6-50 MG tablet Take 2 tablets by mouth at bedtime.    [provider]  traMADol (ULTRAM) 50 MG tablet Take 50 mg by mouth every 4 (four) hours as needed (for pain).    [provider]      Allergies    Pneumococcal vaccines, Wound dressing adhesive, Zinc, Ampicillin, Neomycin-bacitracin zn-polymyx, and Penicillins    Review of Systems   Review of Systems  Unable to perform ROS: Dementia    Physical Exam Updated Vital Signs BP (!) 166/109   Pulse (!) 102  Temp 98.8 F (37.1 C)   Resp 18   Ht '5\' 2"'$  (1.575 m)   Wt 60 kg   SpO2 98%   BMI 24.19 kg/m  Physical Exam Vitals and nursing note reviewed.   82 year old female, resting comfortably and in no acute distress. Vital signs are significant for elevated blood pressure and borderline elevated heart rate. Oxygen saturation is 98%, which is normal. Head is normocephalic.  Small laceration present right occiput.  Anisocoria is present with left pupil 5 mm and right pupil 3 mm. Oropharynx is clear. Neck is immobilized in a stiff cervical collar and is nontender. Back is nontender. Lungs are clear without rales, wheezes, or rhonchi. Chest is nontender. Heart has regular rate and rhythm without murmur. Abdomen is soft, flat,  nontender. Extremities have no cyanosis or edema, full range of motion is present. Skin is warm and dry without rash. Neurologic: Awake and alert, oriented to person but not place or time.  Moves all extremities equally.  Anisocoria as noted above.  ED Results / Procedures / Treatments    Radiology CT Cervical Spine Wo Contrast  Result Date: 07/29/2022 CLINICAL DATA:  82 year old female status post unwitnessed fall. Posterior right head laceration. Altered mental status. EXAM: CT CERVICAL SPINE WITHOUT CONTRAST TECHNIQUE: Multidetector CT imaging of the cervical spine was performed without intravenous contrast. Multiplanar CT image reconstructions were also generated. RADIATION DOSE REDUCTION: This exam was performed according to the departmental dose-optimization program which includes automated exposure control, adjustment of the mA and/or kV according to patient size and/or use of iterative reconstruction technique. COMPARISON:  Head CT today.  Cervical spine CT 07/10/2022. FINDINGS: Alignment: Stable. Straightening of cervical lordosis following multi level ACDF and degenerative appearing anterolisthesis at the C3-C4 adjacent segment. Cervicothoracic junction alignment is within normal limits. Bilateral posterior element alignment is within normal limits. Skull base and vertebrae: Visualized skull base is intact. No atlanto-occipital dissociation. Leftward head rotation related C1-C2 alignment remains within normal limits. C1 and C2 appear intact. No acute osseous abnormality identified. Soft tissues and spinal canal: No prevertebral fluid or swelling. No visible canal hematoma. Bulky calcified carotid bifurcation atherosclerosis in the neck bilaterally. Atrophied submandibular glands. No acute neck soft tissue finding identified. Disc levels: Relatively mild for age C1-C2 and C2-C3 degeneration, despite C3-C4 developing interbody ankylosis (coronal image 37) and superimposed long segment C4 through C7  prior ACDF. Solid appearing postoperative arthrodesis and no hardware loosening identified. Multifactorial C3-C4 spinal stenosis in part due to ligament flavum hypertrophy appears chronic and stable, mild-to-moderate. At least mild chronic spinal stenosis also suspected at the C7-T1 adjacent segment. Upper chest: Visible upper thoracic levels appears stable including mild T2 superior endplate compression. Upper lungs are clear. Negative visible superior mediastinum aside from calcified atherosclerosis. IMPRESSION: 1. No acute traumatic injury identified in the cervical spine. 2. Prior C4 through C7 ACDF with solid arthrodesis, and developing ankylosis of the adjacent C3-C4 segment. Multifactorial chronic spinal stenosis there, and probably also at the C7-T1 adjacent segment. 3. Calcified carotid atherosclerosis. Electronically Signed   By: Genevie Ann M.D.   On: 07/29/2022 04:26   CT Head Wo Contrast  Result Date: 07/29/2022 CLINICAL DATA:  82 year old female status post unwitnessed fall. Posterior right head laceration. Altered mental status. EXAM: CT HEAD WITHOUT CONTRAST TECHNIQUE: Contiguous axial images were obtained from the base of the skull through the vertex without intravenous contrast. RADIATION DOSE REDUCTION: This exam was performed according to the departmental dose-optimization program which includes automated exposure  control, adjustment of the mA and/or kV according to patient size and/or use of iterative reconstruction technique. COMPARISON:  Head CT 07/10/2022. FINDINGS: Brain: Stable cerebral volume. No midline shift, ventriculomegaly, mass effect, evidence of mass lesion, intracranial hemorrhage or evidence of cortically based acute infarction. Confluent bilateral cerebral white matter hypodensity is stable. Vascular: Calcified atherosclerosis at the skull base. No suspicious intracranial vascular hyperdensity. Skull: Hyperostosis of the calvarium. No acute osseous abnormality identified.  Sinuses/Orbits: Trace fluid or mucosal thickening in the right maxillary sinus. Similar low-density fluid level in the sphenoid sinus is new. Moderate bilateral frontal and ethmoid sinus fluid, mucosal thickening and opacification is new from last month. Tympanic cavities and mastoids remain clear. Other: Broad-based posterior scalp hematoma or contusion eccentric to the left on series 3, image 44, and tracking toward the vertex. Underlying calvarium appears intact. No scalp soft tissue gas. Orbits appear stable and negative. IMPRESSION: 1. Broad-based posterior scalp hematoma without underlying skull fracture. 2. No acute intracranial abnormality, stable non contrast CT appearance of the brain. 3. New bilateral paranasal sinus inflammation since last month, compatible with Acute Sinusitis. Electronically Signed   By: Genevie Ann M.D.   On: 07/29/2022 04:21    Procedures .Marland KitchenLaceration Repair  Date/Time: 07/29/2022 5:38 AM  Performed by: Delora Fuel, MD Authorized by: Delora Fuel, MD   Consent:    Consent obtained: Implied consent.   Consent given by: Implied consent. Universal protocol:    Relevant documents present and verified: yes     Test results available: yes     Imaging studies available: yes     Required blood products, implants, devices, and special equipment available: yes     Site/side marked: yes     Immediately prior to procedure, a time out was called: yes     Patient identity confirmed:  Anonymous protocol, patient vented/unresponsive Anesthesia:    Anesthesia method:  None Laceration details:    Location:  Scalp   Scalp location:  Occipital   Length (cm):  2.6   Depth (mm):  4 Pre-procedure details:    Preparation:  Patient was prepped and draped in usual sterile fashion and imaging obtained to evaluate for foreign bodies Exploration:    Limited defect created (wound extended): no     Hemostasis achieved with:  Direct pressure   Imaging obtained: x-ray     Imaging outcome:  foreign body not noted     Wound exploration: entire depth of wound visualized     Wound extent: no foreign body, no signs of injury and no underlying fracture     Contaminated: no   Treatment:    Area cleansed with:  Saline   Amount of cleaning:  Standard   Debridement:  None   Undermining:  None   Scar revision: no   Skin repair:    Repair method:  Staples   Number of staples:  4 Approximation:    Approximation:  Close Repair type:    Repair type:  Simple Post-procedure details:    Dressing:  Open (no dressing)   Procedure completion:  Tolerated well, no immediate complications   Cardiac monitor shows normal sinus rhythm, per my interpretation.  Medications Ordered in ED Medications - No data to display  ED Course/ Medical Decision Making/ A&P                             Medical Decision Making Amount and/or Complexity of Data Reviewed Radiology:  ordered.   Unwitnessed fall with scalp laceration.  Sharlee Blew present which I suspect is benign.  I have ordered CT of head and cervical spine.  I reviewed her old records, and she was admitted on 02/02/2021 for traumatic subdural hematoma, multiple ED visits for falls following that.  Tdap booster given on 09/26/2017, no need for  CT scan showed no acute process.  I have independently viewed the images, and agree with radiologist's interpretation.  Laceration was closed with staples and she is returned to her skilled nursing facility with instructions to have staples removed in 7-10 days.  Final Clinical Impression(s) / ED Diagnoses Final diagnoses:  Fall at nursing home, initial encounter  Laceration of occipital scalp, initial encounter    Rx / DC Orders ED Discharge Orders     None         Delora Fuel, MD [id number] [phone]

## 2022-07-29 NOTE — ED Notes (Signed)
[facility] called to transport patient back to [facility]

## 2022-10-16 ENCOUNTER — Emergency Department (HOSPITAL_COMMUNITY): Payer: Medicare Other

## 2022-10-16 ENCOUNTER — Other Ambulatory Visit: Payer: Self-pay

## 2022-10-16 ENCOUNTER — Encounter (HOSPITAL_COMMUNITY): Payer: Self-pay

## 2022-10-16 ENCOUNTER — Emergency Department (HOSPITAL_COMMUNITY)
Admission: EM | Admit: 2022-10-16 | Discharge: 2022-10-17 | Disposition: A | Discharge status: Home / Self Care | Payer: Medicare Other | Attending: Emergency Medicine | Admitting: Emergency Medicine

## 2022-10-16 DIAGNOSIS — Z7982 Long term (current) use of aspirin: Secondary | ICD-10-CM | POA: Diagnosis not present

## 2022-10-16 DIAGNOSIS — S99912A Unspecified injury of left ankle, initial encounter: Secondary | ICD-10-CM | POA: Diagnosis present

## 2022-10-16 DIAGNOSIS — F0390 Unspecified dementia without behavioral disturbance: Secondary | ICD-10-CM | POA: Diagnosis not present

## 2022-10-16 DIAGNOSIS — S93402A Sprain of unspecified ligament of left ankle, initial encounter: Secondary | ICD-10-CM | POA: Diagnosis not present

## 2022-10-16 DIAGNOSIS — I10 Essential (primary) hypertension: Secondary | ICD-10-CM | POA: Diagnosis not present

## 2022-10-16 DIAGNOSIS — E119 Type 2 diabetes mellitus without complications: Secondary | ICD-10-CM | POA: Diagnosis not present

## 2022-10-16 DIAGNOSIS — W19XXXA Unspecified fall, initial encounter: Secondary | ICD-10-CM | POA: Insufficient documentation

## 2022-10-16 DIAGNOSIS — Y92838 Other recreation area as the place of occurrence of the external cause: Secondary | ICD-10-CM | POA: Diagnosis not present

## 2022-10-16 DIAGNOSIS — Z79899 Other long term (current) drug therapy: Secondary | ICD-10-CM | POA: Insufficient documentation

## 2022-10-16 LAB — COMPREHENSIVE METABOLIC PANEL
ALT: 43 U/L (ref 0–44)
AST: 35 U/L (ref 15–41)
Albumin: 3.6 g/dL (ref 3.5–5.0)
Alkaline Phosphatase: 429 U/L — ABNORMAL HIGH (ref 38–126)
Anion gap: 12 (ref 5–15)
BUN: 26 mg/dL — ABNORMAL HIGH (ref 8–23)
CO2: 27 mmol/L (ref 22–32)
Calcium: 9.2 mg/dL (ref 8.9–10.3)
Chloride: 103 mmol/L (ref 98–111)
Creatinine, Ser: 1.05 mg/dL — ABNORMAL HIGH (ref 0.44–1.00)
GFR, Estimated: 53 mL/min — ABNORMAL LOW (ref >60)
Glucose, Bld: 138 mg/dL — ABNORMAL HIGH (ref 70–99)
Potassium: 3.8 mmol/L (ref 3.5–5.1)
Sodium: 142 mmol/L (ref 135–145)
Total Bilirubin: 1.6 mg/dL — ABNORMAL HIGH (ref 0.3–1.2)
Total Protein: 7.9 g/dL (ref 6.5–8.1)

## 2022-10-16 LAB — CBC WITH DIFFERENTIAL/PLATELET
Abs Immature Granulocytes: 0.04 10*3/uL (ref 0.00–0.07)
Basophils Absolute: 0.1 10*3/uL (ref 0.0–0.1)
Basophils Relative: 1 %
Eosinophils Absolute: 0.3 10*3/uL (ref 0.0–0.5)
Eosinophils Relative: 3 %
HCT: 39.6 % (ref 36.0–46.0)
Hemoglobin: 12.5 g/dL (ref 12.0–15.0)
Immature Granulocytes: 0 %
Lymphocytes Relative: 20 %
Lymphs Abs: 2.1 10*3/uL (ref 0.7–4.0)
MCH: 29.3 pg (ref 26.0–34.0)
MCHC: 31.6 g/dL (ref 30.0–36.0)
MCV: 92.7 fL (ref 80.0–100.0)
Monocytes Absolute: 1 10*3/uL (ref 0.1–1.0)
Monocytes Relative: 10 %
Neutro Abs: 6.9 10*3/uL (ref 1.7–7.7)
Neutrophils Relative %: 66 %
Platelets: 318 10*3/uL (ref 150–400)
RBC: 4.27 MIL/uL (ref 3.87–5.11)
RDW: 13.5 % (ref 11.5–15.5)
WBC: 10.3 10*3/uL (ref 4.0–10.5)
nRBC: 0 % (ref 0.0–0.2)

## 2022-10-16 LAB — MAGNESIUM: Magnesium: 2.1 mg/dL (ref 1.7–2.4)

## 2022-10-16 LAB — CBG MONITORING, ED: Glucose-Capillary: 136 mg/dL — ABNORMAL HIGH (ref 70–99)

## 2022-10-16 MED ORDER — MIDAZOLAM HCL 2 MG/2ML IJ SOLN: 0.5000 mg | Freq: Once | INTRAMUSCULAR | Status: DC

## 2022-10-16 MED ORDER — ACETAMINOPHEN 500 MG PO TABS
1000.0000 mg | ORAL_TABLET | Freq: Once | ORAL | Status: DC
  Filled 2022-10-16: qty 2

## 2022-10-16 MED ORDER — MIDAZOLAM HCL 2 MG/2ML IJ SOLN
0.5000 mg | Freq: Once | INTRAMUSCULAR | Status: AC
  Administered 2022-10-16: 0.5 mg via INTRAMUSCULAR
  Filled 2022-10-16: qty 2

## 2022-10-16 NOTE — Progress Notes (Signed)
Orthopedic Tech Progress Note Patient Details:  [name] [birth year] [id number]  Ortho Devices Type of Ortho Device: Ace wrap Ortho Device/Splint Location: lle ankle Ortho Device/Splint Interventions: Ordered, Application, Adjustment   Post Interventions Patient Tolerated: Well Instructions Provided: Care of device, Adjustment of device  Trinna Post 10/16/2022, 8:39 PM

## 2022-10-16 NOTE — ED Notes (Signed)
Ptar called 

## 2022-10-16 NOTE — ED Notes (Signed)
Got patient from [facility], paper work printed and facility was called by [employer]. Secretary confirmed [facility]AR was called. Awaiting pick up.

## 2022-10-16 NOTE — ED Notes (Signed)
Pt gone to CT 

## 2022-10-16 NOTE — ED Notes (Signed)
[name] [phone] would like an update asap

## 2022-10-16 NOTE — ED Triage Notes (Signed)
Pt BIB [facility] from [facility] at [facility]. Pt had an unwitnessed fall with injury to the L ankle. Pt also has hx of dementia. Pt is also a diabetic but has not been eating because she refuses to eat. Pt also has sundowners.   EMS VS BP palpated at 170 P 90 R 20 O2 96 RA

## 2022-10-16 NOTE — ED Provider Notes (Signed)
[facility] AT [facility] Provider Note   CSN: [id number] Arrival date & time: 10/16/22  1559     History  Chief Complaint  Patient presents with   [name]    [name] is a 82 y.o. female with hypertension, diabetes, gout and was sent in from a skilled nursing facility following an unwitnessed fall. Reported to have a left ankle injury. On exam, patient is oriented to self only.  She is responding with stuttering responses.  She cannot answer most questions or provide any history.  When asked if she was in pain she says yes.  When asked if her head hurts she says yes.  Patient does not want to be examined and states she wants to be left alone. Per chart review this is consistent with prior behavior on prior ED visits.  Per EMS, there was some concern from the facility staff the patient has been refusing oral intake lately which is abnormal for her.     HPI     Home Medications Prior to Admission medications   Medication Sig Start Date End Date Taking? Authorizing Provider  amLODipine (NORVASC) 5 MG tablet Take 1 tablet (5 mg total) by mouth daily. 05/21/21   Etta Grandchild, MD  aspirin 81 MG EC tablet Take 1 tablet (81 mg total) by mouth daily. Swallow whole. 04/26/21   Etta Grandchild, MD  chlorhexidine gluconate, MEDLINE KIT, (PERIDEX) 0.12 % solution Use as directed 15 mLs in the mouth or throat See admin instructions. Swish 15 ml's in the mouth and hold for 2 minutes, then spit out- TWICE A DAY    [provider]  citalopram (CELEXA) 10 MG tablet Take 1 tablet (10 mg total) by mouth daily. 05/21/21   Etta Grandchild, MD  donepezil (ARICEPT) 10 MG tablet Take 10 mg by mouth at bedtime.    [provider]  haloperidol (HALDOL) 1 MG tablet Take 1 mg by mouth See admin instructions. Take 1 mg by mouth as needed for agitation, 45 minutes before completing ADL care    [provider]  hydrOXYzine (VISTARIL) 25 MG capsule Take  25 mg by mouth in the morning and at bedtime.    [provider]  melatonin 3 MG TABS tablet Take 3 mg by mouth at bedtime.    [provider]  Memantine HCl-Donepezil HCl [facility]) 28-10 MG CP24 Take 1 capsule at bedtime Patient not taking: Reported on 01/17/2022 05/21/21   Etta Grandchild, MD  NAMENDA XR 28 MG CP24 24 hr capsule Take 28 mg by mouth at bedtime.    [provider]  propranolol (INDERAL) 20 MG tablet Take 1 tablet (20 mg total) by mouth 2 (two) times daily. 05/21/21   Etta Grandchild, MD  QUEtiapine (SEROQUEL) 25 MG tablet Take 1 tablet (25 mg total) by mouth 2 (two) times daily between meals as needed. Patient taking differently: Take 25 mg by mouth 2 (two) times daily between meals as needed (for behavioral disturbances). 08/12/21   Etta Grandchild, MD  QUEtiapine (SEROQUEL) 50 MG tablet Take 50 mg by mouth every 12 (twelve) hours.    [provider]  SENEXON-S 8.6-50 MG tablet Take 2 tablets by mouth at bedtime.    [provider]  traMADol (ULTRAM) 50 MG tablet Take 50 mg by mouth every 4 (four) hours as needed (for pain).    [provider]      Allergies  Pneumococcal vaccines, Wound dressing adhesive, Zinc, Ampicillin, Neomycin-bacitracin zn-polymyx, and Penicillins    Review of Systems   Review of Systems  Unable to perform ROS: Dementia   Physical Exam Updated Vital Signs BP (!) 147/47 (BP Location: Left Arm)   Pulse 84   Temp 98.8 F (37.1 C) (Axillary)   Resp 18   Ht 5\' 2"  (1.575 m)   Wt 60 kg   SpO2 97%   BMI 24.19 kg/m  Physical Exam General: Normal appearing elderly female, lying in bed.  HEENT: PERRLA, EOMI, Sclera anicteric, MMM, trachea midline. NCAT.  Cardiology: RRR, no murmurs/rubs/gallops. BL radial and DP pulses equal bilaterally.  Resp: Normal respiratory rate and effort. CTAB, no wheezes, rhonchi, crackles.  Abd: Soft, non-tender, non-distended. No rebound tenderness or guarding.  GU:  Deferred. MSK: TTP diffusely to L ankle, with mild edema. No deformity.  Intact DP/PT pulses.  Compartments are soft.  Otherwise, no peripheral edema or signs of trauma. No cyanosis or clubbing. Skin: warm, dry. No rashes or lesions. Back: No midline C-spine, T or L spine TTP or stepoffs Neuro: A&Ox1, CNs II-XII grossly intact. MAEs. Sensation grossly intact.  Psych: Uncooperative mood and affect.   ED Results / Procedures / Treatments   Labs (all labs ordered are listed, but only abnormal results are displayed) Labs Reviewed  COMPREHENSIVE METABOLIC PANEL - Abnormal; Notable for the following components:      Result Value   Glucose, Bld 138 (*)    BUN 26 (*)    Creatinine, Ser 1.05 (*)    Alkaline Phosphatase 429 (*)    Total Bilirubin 1.6 (*)    GFR, Estimated 53 (*)    All other components within normal limits  CBG MONITORING, ED - Abnormal; Notable for the following components:   Glucose-Capillary 136 (*)    All other components within normal limits  CBC WITH DIFFERENTIAL/PLATELET  MAGNESIUM    EKG EKG Interpretation  Date/Time:  Monday Oct 16 2022 16:28:06 EDT Ventricular Rate:  82 PR Interval:  178 QRS Duration: 92 QT Interval:  385 QTC Calculation: 450 R Axis:   83 Text Interpretation: Sinus rhythm Atrial premature complexes Similar to prior Confirmed by Vivi Barrack [phone]) on 10/16/2022 6:30:42 PM  Radiology No results found.  Procedures Procedures    Medications Ordered in ED Medications  midazolam (VERSED) injection 0.5 mg (0.5 mg Intramuscular Given 10/16/22 2118)    ED Course/ Medical Decision Making/ A&P                          Medical Decision Making Amount and/or Complexity of Data Reviewed Labs: ordered. Radiology: ordered. Decision-making details documented in ED Course.  Risk Prescription drug management.    This patient presents to the ED for concern of fall at facility, this involves an extensive number of treatment options, and is  a complaint that carries with it a high risk of complications and morbidity.  I considered the following differential and admission for this acute, potentially life threatening condition.  Patient is overall hemodynamically stable and appears to be acting at her baseline per chart review though there is no representative from facility or family at bedside.  MDM:    DDX for trauma includes but is not limited to:  -Head Injury such as skull fx or ICH -NCAT on exam, however patient with significant dementia and cannot provide history.  Will obtain CT head for further eval. -Chest Injury and Abdominal Injury -  no reported CP/abd pain, no evidence of significant injury on exam.  Will obtain chest x-ray due to fall -Spinal Cord or Vertebral injury - MAEs, no report of neck pain but with h/o dementia will obtain CT C-spine for further eval -Fractures - patient with some edema/pain of L ankle, consider L tib/fib, ankle, or foot fracture. Good pulses/cap refill in LLE.    Clinical Course as of 10/28/22 0104  Mon Oct 16, 2022  1803 CT Head Wo Contrast 1. No evidence of acute intracranial abnormality. 2. Moderate chronic small vessel ischemic disease and cerebral atrophy.   [HN]  1829 DG Chest 1 View 1. No active disease. 2. Unchanged mild chronic bilateral interstitial thickening.   [HN]  1829 DG Pelvis 1-2 Views 1. Within the limitation of diffuse decreased bone mineralization, no acute fracture is seen. 2. Moderate bilateral femoroacetabular osteoarthritis, similar to prior.   [HN]  1830 DG Foot Complete Left 1. Tiny ossicle distal to the fibula appears unchanged from prior and is favored to be chronic. No definite acute within the left tibia/fibula, left ankle, or left foot. 2. Moderate diffuse soft tissue swelling, possibly systemic. 3. Moderate medial compartment and mild-to-moderate lateral and patellofemoral compartment osteoarthritis. [HN]  2103 Pt reportedly refusing all oral  intake, which is unusual for her. However she is very combative. Do think urine is necessary. Will provide some IM versed for patient to help facilitate UA. [HN]  2251 Tried calling patient's son Odessa Fleming x 3 without answer, voicemail is full and cannot leave message. [HN]  2252 Called patient's daughter Elnita Maxwell who states that her brother stated she was trying to eat today when he went there. Elnita Maxwell was there a couple of days ago and she was eating just fine. She states that A&Ox1 and stuttering speech is normal for her. Intermittent combativeness is also normal for her. Given that patient is reportedly acting normally, has actually been trying to eat, is afebrile and without white count, kidney function is normal, and so I do not believe it is necessary to sedate patient for a urine sample, which is what would be necessary to obtain one. I discussed this with Elnita Maxwell who is in agreement. She also states that if she develops symptoms, the facility can take urine if they need to with the hospice doctor, as patient is also on hospice.  [HN]    Clinical Course User Index [HN] Loetta Rough, MD    Labs: I Ordered, and personally interpreted labs.  The pertinent results include:  those listed above  Imaging Studies ordered: I ordered imaging studies including CTH, CXR, PXR, L ankle XR I independently visualized and interpreted imaging. I agree with the radiologist interpretation  Additional history obtained from chart review, daughter over the phone  Reevaluation: After the interventions noted above, I reevaluated the patient and found that they have :stayed the same  Social Determinants of Health: Lives at SNF  Disposition:  DC w/ discharge instructions/return precautions. All questions answered to patient's satisfaction.    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    Other and unspecified hyperlipidemia    Pain in joint, shoulder region  Pain in limb    Routine general medical examination at a health care facility    Sciatica    Type II or unspecified type diabetes mellitus without mention of complication, uncontrolled    Unspecified essential hypertension    Unspecified venous (peripheral) insufficiency      Medicines Meds ordered this encounter  Medications   DISCONTD: acetaminophen (TYLENOL) tablet 1,000 mg   DISCONTD: midazolam (VERSED) injection 0.5 mg   midazolam (VERSED) injection 0.5 mg    I have reviewed the patients home medicines and have made adjustments as needed  Problem List / ED Course: Problem List Items Addressed This Visit   None Visit Diagnoses     Fall, initial encounter    -  Primary   Sprain of left ankle, unspecified ligament, initial encounter                       This note was created using dictation software, which may contain spelling or grammatical errors.    Loetta Rough, MD 10/28/22 [phone]

## 2022-10-16 NOTE — Discharge Instructions (Signed)
Thank you for coming to [facility] Emergency Department. You were seen for fall. We did an exam, labs, and imaging, and these showed no acute findings. You did likely sprain your ankle, which was wrapped in an ACE wrap, You can use compression, ice, rest, and elevation to help prevent pain/swelling. You can alternate taking Tylenol and ibuprofen as needed for pain. You can take 650mg  tylenol (acetaminophen) every 4-6 hours, and 600 mg ibuprofen 3 times a day.    Please follow up with your primary care provider within 1 week.   Do not hesitate to return to the ED or call 911 if you experience: -Worsening symptoms -lethargy -Shortness of breath, chest pain -Lightheadedness, passing out -Fevers/chills -Anything else that concerns you

## 2022-10-16 NOTE — ED Notes (Signed)
Unable to collect labs due to patient being combative and sundowning.... MD Benjie Karvonen made aware.Marland KitchenMarland Kitchen

## 2022-10-18 ENCOUNTER — Ambulatory Visit: Payer: Self-pay

## 2022-10-18 ENCOUNTER — Telehealth: Payer: Self-pay

## 2022-10-18 NOTE — Transitions of Care (Post Inpatient/ED Visit) (Signed)
10/18/2022  Name: [name] MRN: [id number] DOB: 1941/03/07  Today's TOC FU Call Status: Today's TOC FU Call Status:: Successful TOC FU Call Competed TOC FU Call Complete Date: 10/18/22  Transition Care Management Follow-up Telephone Call Date of Discharge: 10/17/22 Discharge Facility: [facility]) Type of Discharge: Emergency Department Reason for ED Visit: Other: (fall)  Items Reviewed:    Medications Reviewed Today: Medications Reviewed Today     Reviewed by Salvatore Marvel, CPhT (Pharmacy Technician) on 07/10/22 at 2219  Med List Status: Complete   Medication Order Taking? Sig Documenting Provider Last Dose Status Informant  amLODipine (NORVASC) 5 MG tablet [id number] Yes Take 1 tablet (5 mg total) by mouth daily. Etta Grandchild, MD 07/10/2022 0800 Active Nursing Home Medication Administration Guide (MAG)  aspirin 81 MG EC tablet [id number] Yes Take 1 tablet (81 mg total) by mouth daily. Swallow whole. Etta Grandchild, MD 07/10/2022 0800 Active Nursing Home Medication Administration Guide (MAG)  chlorhexidine gluconate, MEDLINE KIT, (PERIDEX) 0.12 % solution [id number] Yes Use as directed 15 mLs in the mouth or throat See admin instructions. Swish 15 ml's in the mouth and hold for 2 minutes, then spit out- TWICE A DAY [provider] 07/10/2022 0800 Active Nursing Home Medication Administration Guide (MAG)  citalopram (CELEXA) 10 MG tablet [id number] Yes Take 1 tablet (10 mg total) by mouth daily. Etta Grandchild, MD 07/10/2022 0800 Active Nursing Home Medication Administration Guide (MAG)  donepezil (ARICEPT) 10 MG tablet [id number] Yes Take 10 mg by mouth at bedtime. [provider] 07/09/2022 2000 Active Nursing Home Medication Administration Guide (MAG)  haloperidol (HALDOL) 1 MG tablet [id number] Yes Take 1 mg by mouth See admin instructions. Take 1 mg by mouth as needed for agitation, 45 minutes before completing ADL care [provider] 07/06/2022 Active  Nursing Home Medication Administration Guide (MAG)  hydrOXYzine (VISTARIL) 25 MG capsule [id number] Yes Take 25 mg by mouth in the morning and at bedtime. [provider] 07/10/2022 0800 Active Nursing Home Medication Administration Guide (MAG)  melatonin 3 MG TABS tablet [id number] Yes Take 3 mg by mouth at bedtime. [provider] 07/09/2022 2000 Active Nursing Home Medication Administration Guide (MAG)  Memantine HCl-Donepezil HCl [facility]) 28-10 MG CP24 [id number] No Take 1 capsule at bedtime  Patient not taking: Reported on 01/17/2022   Etta Grandchild, MD Not Taking Active Nursing Home Medication Administration Guide (MAG)  NAMENDA XR 28 MG CP24 24 hr capsule [id number] Yes Take 28 mg by mouth at bedtime. [provider] 07/09/2022 2000 Active Nursing Home Medication Administration Guide (MAG)  propranolol (INDERAL) 20 MG tablet [id number] Yes Take 1 tablet (20 mg total) by mouth 2 (two) times daily. Etta Grandchild, MD 07/10/2022 0800 Active Nursing Home Medication Administration Guide (MAG)  QUEtiapine (SEROQUEL) 25 MG tablet [id number] Yes Take 1 tablet (25 mg total) by mouth 2 (two) times daily between meals as needed.  Patient taking differently: Take 25 mg by mouth 2 (two) times daily between meals as needed (for behavioral disturbances).   Etta Grandchild, MD unk Active Nursing Home Medication Administration Guide (MAG)           Med Note Sharlynn Oliphant Jul 10, 2022 10:13 PM)    QUEtiapine (SEROQUEL) 50 MG tablet [id number] Yes Take 50 mg by mouth every 12 (twelve) hours. [provider] 07/10/2022 0800 Active Nursing Home Medication Administration Guide (MAG)  SENEXON-S 8.6-50 MG tablet [id number] Yes Take 2 tablets  by mouth at bedtime. [provider] 07/09/2022 2000 Active Nursing Home Medication Administration Guide (MAG)  traMADol (ULTRAM) 50 MG tablet [id number] Yes Take 50 mg by mouth every 4 (four) hours as needed (for pain). [provider] unk Active Nursing Home Medication Administration Guide (MAG)            Home Care and Equipment/Supplies:    Functional Questionnaire:    Follow up appointments reviewed:   Patient is resident of [facility]   SIGNATURE Karena Addison, LPN [facility] Nurse Health Advisor Direct Dial [phone]

## 2022-10-18 NOTE — Chronic Care Management (AMB) (Signed)
   10/18/2022  DRUE HOLLE [birth year] [id number]   Encounter created to change CCM status from enrolled to previously enrolled.   Alto Denver RN, MSN, CCM RN Care Manager  Chronic Care Management Direct Number: [phone]

## 2022-10-26 ENCOUNTER — Ambulatory Visit: Payer: Medicare Other | Admitting: Internal Medicine

## 2022-12-01 ENCOUNTER — Emergency Department (HOSPITAL_COMMUNITY): Payer: Medicare Other

## 2022-12-01 ENCOUNTER — Encounter (HOSPITAL_COMMUNITY): Payer: Self-pay

## 2022-12-01 ENCOUNTER — Emergency Department (HOSPITAL_COMMUNITY)
Admission: EM | Admit: 2022-12-01 | Discharge: 2022-12-01 | Disposition: A | Discharge status: Home / Self Care | Payer: Medicare Other | Attending: Emergency Medicine | Admitting: Emergency Medicine

## 2022-12-01 ENCOUNTER — Other Ambulatory Visit: Payer: Self-pay

## 2022-12-01 DIAGNOSIS — W1839XA Other fall on same level, initial encounter: Secondary | ICD-10-CM | POA: Diagnosis not present

## 2022-12-01 DIAGNOSIS — R030 Elevated blood-pressure reading, without diagnosis of hypertension: Secondary | ICD-10-CM

## 2022-12-01 DIAGNOSIS — Z7982 Long term (current) use of aspirin: Secondary | ICD-10-CM | POA: Insufficient documentation

## 2022-12-01 DIAGNOSIS — S0083XA Contusion of other part of head, initial encounter: Secondary | ICD-10-CM | POA: Insufficient documentation

## 2022-12-01 DIAGNOSIS — Y9389 Activity, other specified: Secondary | ICD-10-CM | POA: Diagnosis not present

## 2022-12-01 DIAGNOSIS — S0993XA Unspecified injury of face, initial encounter: Secondary | ICD-10-CM | POA: Diagnosis present

## 2022-12-01 DIAGNOSIS — F0390 Unspecified dementia without behavioral disturbance: Secondary | ICD-10-CM | POA: Insufficient documentation

## 2022-12-01 DIAGNOSIS — I10 Essential (primary) hypertension: Secondary | ICD-10-CM | POA: Diagnosis not present

## 2022-12-01 DIAGNOSIS — W19XXXA Unspecified fall, initial encounter: Secondary | ICD-10-CM

## 2022-12-01 DIAGNOSIS — S0011XA Contusion of right eyelid and periocular area, initial encounter: Secondary | ICD-10-CM | POA: Insufficient documentation

## 2022-12-01 DIAGNOSIS — S8001XA Contusion of right knee, initial encounter: Secondary | ICD-10-CM | POA: Diagnosis not present

## 2022-12-01 DIAGNOSIS — Z79899 Other long term (current) drug therapy: Secondary | ICD-10-CM | POA: Diagnosis not present

## 2022-12-01 MED ORDER — AMLODIPINE BESYLATE 5 MG PO TABS
5.0000 mg | ORAL_TABLET | Freq: Once | ORAL | Status: AC
  Administered 2022-12-01: 5 mg via ORAL
  Filled 2022-12-01: qty 1

## 2022-12-01 MED ORDER — ACETAMINOPHEN 500 MG PO TABS
1000.0000 mg | ORAL_TABLET | Freq: Once | ORAL | Status: AC
  Administered 2022-12-01: 1000 mg via ORAL
  Filled 2022-12-01: qty 2

## 2022-12-01 NOTE — ED Triage Notes (Signed)
Pt is also complaining of right knee pain. Pt's right knee has an abrasion and is swollen.

## 2022-12-01 NOTE — ED Notes (Signed)
[facility] called, No ETA 

## 2022-12-01 NOTE — Discharge Instructions (Signed)
It was our pleasure to provide your ER care today - we hope that you feel better.  Fall precautions - use great care/caution, and assistance when up and about  to help decrease risk of falling.   Cold/icepack to sore area. Take acetaminophen as need.   Your blood pressure is high today - continue meds, follow up closely with primary care doctor in the coming week.   Return to ER if worse, new symptoms, new/severe pain, fevers, chest pain, trouble breathing, or other concern.

## 2022-12-01 NOTE — ED Provider Notes (Signed)
[place] EMERGENCY DEPARTMENT AT [facility] Provider Note   CSN: [id number] Arrival date & time: 12/01/22  1827     History  Chief Complaint  Patient presents with   Marletta Lor    [name] is a 82 y.o. female.  [facility] fall [facility]. Pt found in hall. No noted loc. Pts mental status described as c/w baseline post fall, hx dementia/confusion at baseline. Pt with large contusion to right face. Also w contusion left knee. Superficial abrasions, tetanus up to date. Pt limited historian - level 5 caveat. No anticoagulant use. No report of fevers.   The history is provided by the patient and medical records. The history is limited by the condition of the patient.  Fall Pertinent negatives include no chest pain, no abdominal pain and no shortness of breath.       Home Medications Prior to Admission medications   Medication Sig Start Date End Date Taking? Authorizing Provider  amLODipine (NORVASC) 5 MG tablet Take 1 tablet (5 mg total) by mouth daily. 05/21/21   Etta Grandchild, MD  aspirin 81 MG EC tablet Take 1 tablet (81 mg total) by mouth daily. Swallow whole. 04/26/21   Etta Grandchild, MD  chlorhexidine gluconate, MEDLINE KIT, (PERIDEX) 0.12 % solution Use as directed 15 mLs in the mouth or throat See admin instructions. Swish 15 ml's in the mouth and hold for 2 minutes, then spit out- TWICE A DAY    [provider]  citalopram (CELEXA) 10 MG tablet Take 1 tablet (10 mg total) by mouth daily. 05/21/21   Etta Grandchild, MD  donepezil (ARICEPT) 10 MG tablet Take 10 mg by mouth at bedtime.    [provider]  haloperidol (HALDOL) 1 MG tablet Take 1 mg by mouth See admin instructions. Take 1 mg by mouth as needed for agitation, 45 minutes before completing ADL care    [provider]  hydrOXYzine (VISTARIL) 25 MG capsule Take 25 mg by mouth in the morning and at bedtime.    [provider]  melatonin 3 MG TABS tablet Take 3 mg by mouth at  bedtime.    [provider]  Memantine HCl-Donepezil HCl [facility]) 28-10 MG CP24 Take 1 capsule at bedtime Patient not taking: Reported on 01/17/2022 05/21/21   Etta Grandchild, MD  NAMENDA XR 28 MG CP24 24 hr capsule Take 28 mg by mouth at bedtime.    [provider]  propranolol (INDERAL) 20 MG tablet Take 1 tablet (20 mg total) by mouth 2 (two) times daily. 05/21/21   Etta Grandchild, MD  QUEtiapine (SEROQUEL) 25 MG tablet Take 1 tablet (25 mg total) by mouth 2 (two) times daily between meals as needed. Patient taking differently: Take 25 mg by mouth 2 (two) times daily between meals as needed (for behavioral disturbances). 08/12/21   Etta Grandchild, MD  QUEtiapine (SEROQUEL) 50 MG tablet Take 50 mg by mouth every 12 (twelve) hours.    [provider]  SENEXON-S 8.6-50 MG tablet Take 2 tablets by mouth at bedtime.    [provider]  traMADol (ULTRAM) 50 MG tablet Take 50 mg by mouth every 4 (four) hours as needed (for pain).    [provider]      Allergies    Pneumococcal vaccines, Wound dressing adhesive, Zinc, Ampicillin, Neomycin-bacitracin zn-polymyx, and Penicillins    Review of Systems   Review of Systems  Constitutional:  Negative for fever.  Respiratory:  Negative for shortness of breath.   Cardiovascular:  Negative for chest pain.  Gastrointestinal:  Negative for abdominal pain and vomiting.  Genitourinary:  Negative for dysuria and flank pain.  Musculoskeletal:  Negative for back pain.  Skin:  Negative for rash.  Neurological:  Negative for numbness.    Physical Exam Updated Vital Signs BP (!) 191/101   Pulse 81   Temp 98.5 F (36.9 C) (Oral)   Resp 16   Ht 1.626 m (5\' 4" )   Wt 63.5 kg   SpO2 99%   BMI 24.03 kg/m  Physical Exam Vitals and nursing note reviewed.  Constitutional:      Appearance: Normal appearance. She is well-developed.  HENT:     Head:     Comments: Large contusion to right periorbital area,  right forehead and face.     Nose: Nose normal.     Mouth/Throat:     Mouth: Mucous membranes are moist.  Eyes:     General: No scleral icterus.    Extraocular Movements: Extraocular movements intact.     Conjunctiva/sclera: Conjunctivae normal.     Pupils: Pupils are equal, round, and reactive to light.  Neck:     Vascular: No carotid bruit.     Trachea: No tracheal deviation.  Cardiovascular:     Rate and Rhythm: Normal rate and regular rhythm.     Pulses: Normal pulses.     Heart sounds: Normal heart sounds. No murmur heard.    No friction rub. No gallop.  Pulmonary:     Effort: Pulmonary effort is normal. No respiratory distress.     Breath sounds: Normal breath sounds.  Chest:     Chest wall: No tenderness.  Abdominal:     General: There is no distension.     Palpations: Abdomen is soft.     Tenderness: There is no abdominal tenderness. There is no guarding.     Comments: No abd contusion, pain or tenderness.   Genitourinary:    Comments: No cva tenderness.  Musculoskeletal:        General: No swelling.     Cervical back: Normal range of motion and neck supple. No rigidity. No muscular tenderness.     Comments: CTLS spine, non tender, aligned, no step off. Tenderness right knee, knee grossly stable. No other focal pain or bony tenderness on bilateral extremity exam. Distal pulses palp bil. Superficial abrasion right knee.   Skin:    General: Skin is warm and dry.     Findings: No rash.  Neurological:     Mental Status: She is alert.     Comments: Alert, speech normal. GCS 15. Motor/sens grossly intact bil.   Psychiatric:        Mood and Affect: Mood normal.     ED Results / Procedures / Treatments   Labs (all labs ordered are listed, but only abnormal results are displayed) Labs Reviewed - No data to display  EKG EKG Interpretation Date/Time:  Friday December 01 2022 18:49:50 EDT Ventricular Rate:  89 PR Interval:  159 QRS Duration:  91 QT Interval:  356 QTC  Calculation: 434 R Axis:   42  Text Interpretation: Sinus rhythm Confirmed by Cathren Laine ([id number]) on 12/01/2022 6:57:07 PM  Radiology DG Knee Complete 4 Views Right  Result Date: 12/01/2022 CLINICAL DATA:  Fall, right knee pain EXAM: RIGHT KNEE - COMPLETE 4+ VIEW COMPARISON:  None Available. FINDINGS: No evidence of fracture, dislocation, or joint effusion. Tricompartmental knee joint space narrowing  prominent in the medial tibiofemoral and patellofemoral compartments. Chondrocalcinosis. Soft tissues are unremarkable. IMPRESSION: 1. No evidence of fracture or dislocation. 2. Tricompartmental knee joint space narrowing prominent in the medial tibiofemoral and patellofemoral compartments. Electronically Signed   By: Larose Hires D.O.   On: 12/01/2022 20:12   CT Maxillofacial WO CM  Result Date: 12/01/2022 CLINICAL DATA:  Face trauma. EXAM: CT MAXILLOFACIAL WITHOUT CONTRAST TECHNIQUE: Multidetector CT imaging of the maxillofacial structures was performed. Multiplanar CT image reconstructions were also generated. RADIATION DOSE REDUCTION: This exam was performed according to the departmental dose-optimization program which includes automated exposure control, adjustment of the mA and/or kV according to patient size and/or use of iterative reconstruction technique. COMPARISON:  January 17, 2022 FINDINGS: Osseous: No fracture or mandibular dislocation. No destructive process. Orbits: Negative. No traumatic or inflammatory finding. Sinuses: Clear. Soft tissues: Large right facial and right periorbital preseptal hematoma. Limited intracranial: No significant or unexpected finding. IMPRESSION: 1. No acute facial bone fracture. 2. Large right facial and right periorbital preseptal hematoma. Electronically Signed   By: Ted Mcalpine M.D.   On: 12/01/2022 20:10   CT Cervical Spine Wo Contrast  Result Date: 12/01/2022 CLINICAL DATA:  Status post fall. EXAM: CT CERVICAL SPINE WITHOUT CONTRAST TECHNIQUE:  Multidetector CT imaging of the cervical spine was performed without intravenous contrast. Multiplanar CT image reconstructions were also generated. RADIATION DOSE REDUCTION: This exam was performed according to the departmental dose-optimization program which includes automated exposure control, adjustment of the mA and/or kV according to patient size and/or use of iterative reconstruction technique. COMPARISON:  July 29, 2022 FINDINGS: Alignment: Straightening of the cervical lordosis. Skull base and vertebrae: No acute fracture. No primary bone lesion. Prior C4 through C7 ACDF. Soft tissues and spinal canal: No prevertebral fluid or swelling. No visible canal hematoma. Disc levels: C3-C4 interbody ankylosis. C4 through C7 anterior fusion with no hardware loosening. Chronic C3-C4 canal stenosis. Upper chest: Negative. Other: None. IMPRESSION: 1. No acute fracture or subluxation of the cervical spine. 2. Prior C4 through C7 ACDF with no hardware loosening. 3. Chronic C3-C4 canal stenosis. Electronically Signed   By: Ted Mcalpine M.D.   On: 12/01/2022 20:07   CT Head Wo Contrast  Result Date: 12/01/2022 CLINICAL DATA:  Head trauma. EXAM: CT HEAD WITHOUT CONTRAST TECHNIQUE: Contiguous axial images were obtained from the base of the skull through the vertex without intravenous contrast. RADIATION DOSE REDUCTION: This exam was performed according to the departmental dose-optimization program which includes automated exposure control, adjustment of the mA and/or kV according to patient size and/or use of iterative reconstruction technique. COMPARISON:  August 16, 2022 FINDINGS: Brain: No evidence of acute infarction, hemorrhage, hydrocephalus, extra-axial collection or mass lesion/mass effect. Moderate chronic small vessel ischemia and cerebral atrophy. Vascular: No hyperdense vessel or unexpected calcification. Skull: Normal. Negative for fracture or focal lesion. Sinuses/Orbits: No acute finding. Other:  Large right-sided facial hematoma. IMPRESSION: 1. No acute intracranial abnormality. 2. Moderate chronic small vessel ischemia and cerebral atrophy. 3. Large right-sided facial hematoma. Electronically Signed   By: Ted Mcalpine M.D.   On: 12/01/2022 20:02    Procedures Procedures    Medications Ordered in ED Medications  acetaminophen (TYLENOL) tablet 1,000 mg (has no administration in time range)    ED Course/ Medical Decision Making/ A&P                             Medical Decision Making Problems Addressed:  Accidental fall, initial encounter: acute illness or injury with systemic symptoms that poses a threat to life or bodily functions Contusion of face, initial encounter: acute illness or injury Contusion of right knee, initial encounter: acute illness or injury Elevated blood pressure reading: acute illness or injury Essential hypertension: chronic illness or injury with exacerbation, progression, or side effects of treatment that poses a threat to life or bodily functions  Amount and/or Complexity of Data Reviewed Independent Historian: EMS    Details: hx External Data Reviewed: notes. Radiology: ordered and independent interpretation performed. Decision-making details documented in ED Course. ECG/medicine tests: ordered and independent interpretation performed. Decision-making details documented in ED Course.  Risk OTC drugs. Decision regarding hospitalization.   Iv ns. Continuous pulse ox and cardiac monitoring.  Imaging ordered.   Differential diagnosis includes head injury, sdh, facial fractures, etc. Dispo decision including potential need for admission considered - will get imaging and reassess.   Reviewed nursing notes and prior charts for additional history. External reports reviewed. Additional history from: EMS.  Cardiac monitor: sinus rhythm, rate 88.  Xrays reviewed/interpreted by me - no fx.   CT reviewed/interpreted by me - no hem or fx. +facial  contusion.   Tetanus is up to date. Acetaminophen po. Icepack to sore area.   Recheck pt content, alert, no distress. Pt currently appears stable for d/c.   Rec close pcp f/u.  Return precautions provided.          Final Clinical Impression(s) / ED Diagnoses Final diagnoses:  None    Rx / DC Orders ED Discharge Orders     None         Cathren Laine, MD 12/01/22 2046

## 2022-12-01 NOTE — ED Notes (Signed)
Facility not listed in chart, daughter who is bedside advised patient lives at [facility] on [address].

## 2022-12-01 NOTE — ED Triage Notes (Signed)
Per EMS report, pt fell and was found by the staff on the floor in the hallway. Pt is not on blood thinners. Pt has a laceration on her right eyebrow with bruising and swelling. C-collar in place

## 2022-12-28 ENCOUNTER — Encounter (HOSPITAL_COMMUNITY): Payer: Self-pay

## 2022-12-28 ENCOUNTER — Emergency Department (HOSPITAL_COMMUNITY): Payer: Medicare Other

## 2022-12-28 ENCOUNTER — Emergency Department (HOSPITAL_COMMUNITY)
Admission: EM | Admit: 2022-12-28 | Discharge: 2022-12-28 | Disposition: A | Discharge status: Home / Self Care | Payer: Medicare Other | Attending: Emergency Medicine | Admitting: Emergency Medicine

## 2022-12-28 DIAGNOSIS — W01198A Fall on same level from slipping, tripping and stumbling with subsequent striking against other object, initial encounter: Secondary | ICD-10-CM | POA: Insufficient documentation

## 2022-12-28 DIAGNOSIS — S01112A Laceration without foreign body of left eyelid and periocular area, initial encounter: Secondary | ICD-10-CM | POA: Insufficient documentation

## 2022-12-28 DIAGNOSIS — Z7982 Long term (current) use of aspirin: Secondary | ICD-10-CM | POA: Insufficient documentation

## 2022-12-28 DIAGNOSIS — F0390 Unspecified dementia without behavioral disturbance: Secondary | ICD-10-CM | POA: Insufficient documentation

## 2022-12-28 MED ORDER — LIDOCAINE-EPINEPHRINE-TETRACAINE (LET) TOPICAL GEL
3.0000 mL | Freq: Once | TOPICAL | Status: AC
  Administered 2022-12-28: 3 mL via TOPICAL
  Filled 2022-12-28: qty 3

## 2022-12-28 NOTE — Discharge Instructions (Signed)
CTs are negative.  Do not scratch, rub, or pick at the adhesive. Leave tissue adhesive in place. It will come off naturally after 7-10 days. Do not place tape over the adhesive. The adhesive could come off the wound when you pull the tape off. Protect the wound from further injury until it is healed. Check your wound area every day for signs of infection. Check for: More redness, swelling, or pain,Fluid or blood,Warmth, Pus or a bad smell. Do not take baths, swim, or use a hot tub until your health care provider approves. You may only be allowed to take sponge baths. Ask your health care provider if you may take showers.You can usually shower after the first 24 hours. Cover the dressing with a watertight covering when you take a shower. Do not soak the area where there is tissue adhesive. Do not use any soaps, petroleum jelly products, or ointments on the wound. Certain ointments can weaken the adhesive.

## 2022-12-28 NOTE — ED Notes (Signed)
Patient wound cleaned and LET gel applied. Patient intermittently uncooperative but redirectable.

## 2022-12-28 NOTE — ED Notes (Signed)
ETA 2 hours, Called [facility]

## 2022-12-28 NOTE — ED Provider Notes (Signed)
[place] EMERGENCY DEPARTMENT AT [facility] Provider Note   CSN: [id number] Arrival date & time: 12/28/22  1448     History  Chief Complaint  Patient presents with   Marletta Lor    [name] is a 82 y.o. female with a past medical history of dementia with behavioral disturbances.  Patient fell from standing stumbled forward and hit her face on the wall.  She suffered laceration to the left eyebrow.  She does not take any blood thinners.  She was sent in for further evaluation for injury to her face from [facility].   Fall       Home Medications Prior to Admission medications   Medication Sig Start Date End Date Taking? Authorizing Provider  amLODipine (NORVASC) 5 MG tablet Take 1 tablet (5 mg total) by mouth daily. 05/21/21   Etta Grandchild, MD  aspirin 81 MG EC tablet Take 1 tablet (81 mg total) by mouth daily. Swallow whole. 04/26/21   Etta Grandchild, MD  chlorhexidine gluconate, MEDLINE KIT, (PERIDEX) 0.12 % solution Use as directed 15 mLs in the mouth or throat See admin instructions. Swish 15 ml's in the mouth and hold for 2 minutes, then spit out- TWICE A DAY    [provider]  citalopram (CELEXA) 10 MG tablet Take 1 tablet (10 mg total) by mouth daily. 05/21/21   Etta Grandchild, MD  donepezil (ARICEPT) 10 MG tablet Take 10 mg by mouth at bedtime.    [provider]  haloperidol (HALDOL) 1 MG tablet Take 1 mg by mouth See admin instructions. Take 1 mg by mouth as needed for agitation, 45 minutes before completing ADL care    [provider]  hydrOXYzine (VISTARIL) 25 MG capsule Take 25 mg by mouth in the morning and at bedtime.    [provider]  melatonin 3 MG TABS tablet Take 3 mg by mouth at bedtime.    [provider]  Memantine HCl-Donepezil HCl [facility]) 28-10 MG CP24 Take 1 capsule at bedtime Patient not taking: Reported on 01/17/2022 05/21/21   Etta Grandchild, MD  NAMENDA XR 28 MG CP24 24 hr  capsule Take 28 mg by mouth at bedtime.    [provider]  propranolol (INDERAL) 20 MG tablet Take 1 tablet (20 mg total) by mouth 2 (two) times daily. 05/21/21   Etta Grandchild, MD  QUEtiapine (SEROQUEL) 25 MG tablet Take 1 tablet (25 mg total) by mouth 2 (two) times daily between meals as needed. Patient taking differently: Take 25 mg by mouth 2 (two) times daily between meals as needed (for behavioral disturbances). 08/12/21   Etta Grandchild, MD  QUEtiapine (SEROQUEL) 50 MG tablet Take 50 mg by mouth every 12 (twelve) hours.    [provider]  SENEXON-S 8.6-50 MG tablet Take 2 tablets by mouth at bedtime.    [provider]  traMADol (ULTRAM) 50 MG tablet Take 50 mg by mouth every 4 (four) hours as needed (for pain).    [provider]      Allergies    Pneumococcal vaccines, Wound dressing adhesive, Zinc, Ampicillin, Neomycin-bacitracin zn-polymyx, and Penicillins    Review of Systems   Review of Systems  Physical Exam Updated Vital Signs BP (!) 157/115 (BP Location: Right Arm)   Pulse 70   Temp (!) 97.5 F (36.4 C) (Axillary)   Resp 18   SpO2 100%  Physical Exam Vitals and nursing note reviewed.  Constitutional:      General: She is not in acute distress.    Appearance: She is well-developed. She is not diaphoretic.  HENT:     Head: Normocephalic and atraumatic.     Right Ear: External ear normal.     Left Ear: External ear normal.     Nose: Nose normal.     Mouth/Throat:     Mouth: Mucous membranes are moist.  Eyes:     General: No scleral icterus.    Extraocular Movements: Extraocular movements intact.     Conjunctiva/sclera: Conjunctivae normal.     Pupils: Pupils are equal, round, and reactive to light.     Comments: Bruising around the left eye orbit, full EOM.  Cardiovascular:     Rate and Rhythm: Normal rate and regular rhythm.     Heart sounds: Normal heart sounds. No murmur heard.    No friction rub. No gallop.   Pulmonary:     Effort: Pulmonary effort is normal. No respiratory distress.     Breath sounds: Normal breath sounds.  Abdominal:     General: Bowel sounds are normal. There is no distension.     Palpations: Abdomen is soft. There is no mass.     Tenderness: There is no abdominal tenderness. There is no guarding.  Musculoskeletal:     Cervical back: Normal range of motion.  Skin:    General: Skin is warm and dry.  Neurological:     Mental Status: She is alert and oriented to person, place, and time.  Psychiatric:        Behavior: Behavior normal.     ED Results / Procedures / Treatments   Labs (all labs ordered are listed, but only abnormal results are displayed) Labs Reviewed - No data to display  EKG None  Radiology No results found.  Procedures .Marland KitchenLaceration Repair  Date/Time: 12/30/2022 12:47 PM  Performed by: Arthor Captain, PA-C Authorized by: Arthor Captain, PA-C   Consent:    Consent given by:  Healthcare agent Universal protocol:    Patient identity confirmed:  Arm band Anesthesia:    Anesthesia method:  Topical application   Topical anesthetic:  LET Laceration details:    Location:  Face   Face location:  L eyebrow   Length (cm):  3 Pre-procedure details:    Preparation:  Patient was prepped and draped in usual sterile fashion Exploration:    Wound extent: no signs of injury   Treatment:    Area cleansed with:  Chlorhexidine   Irrigation solution:  Sterile saline Skin repair:    Repair method:  Tissue adhesive Approximation:    Approximation:  Close Repair type:    Repair type:  Simple Post-procedure details:    Procedure completion:  Tolerated well, no immediate complications     Medications Ordered in ED Medications - No data to display  ED Course/ Medical Decision Making/ A&P Clinical Course as of 12/30/22 1247  Thu Dec 28, 2022  1744 CT Head Wo Contrast [AH]  1744 CT Maxillofacial Wo Contrast [AH]  1744 CT Cervical Spine Wo  Contrast [AH]    Clinical Course User Index [AH] Arthor Captain, PA-C                                 Medical Decision Making Amount and/or Complexity of Data Reviewed Radiology: ordered. Decision-making details documented in ED Course.   Patient with mechanical fall I personally  visualized and interpreted the images using our PACS system. Acute findings include:   Negative for acute findings- CT head/cspine/max face,  Dermabond applied. Safe for dc back to facility        Final Clinical Impression(s) / ED Diagnoses Final diagnoses:  Laceration of left eyebrow, initial encounter    Rx / DC Orders ED Discharge Orders     None         Arthor Captain, PA-C 12/30/22 1250    Tegeler, Canary Brim, MD 01/04/23 [phone]

## 2022-12-28 NOTE — ED Triage Notes (Signed)
Patient BIB [facility] from [facility] for a fall from standing to her knees and then hit her head on the wall. Patient is not on thinners, dementia A&Ox0 at baseline. 3cm lac to left eyebrow. VSS, slightly hypertensive but did not receive meds this morning per MAR.

## 2022-12-28 NOTE — ED Notes (Signed)
Patient returned from CT

## 2023-02-05 ENCOUNTER — Emergency Department (HOSPITAL_COMMUNITY)
Admission: EM | Admit: 2023-02-05 | Discharge: 2023-02-06 | Disposition: A | Discharge status: Home / Self Care | Payer: Medicare Other | Attending: Emergency Medicine | Admitting: Emergency Medicine

## 2023-02-05 ENCOUNTER — Emergency Department (HOSPITAL_COMMUNITY): Payer: Medicare Other

## 2023-02-05 ENCOUNTER — Other Ambulatory Visit: Payer: Self-pay

## 2023-02-05 ENCOUNTER — Encounter (HOSPITAL_COMMUNITY): Payer: Self-pay

## 2023-02-05 DIAGNOSIS — S0990XA Unspecified injury of head, initial encounter: Secondary | ICD-10-CM | POA: Diagnosis present

## 2023-02-05 DIAGNOSIS — Z7982 Long term (current) use of aspirin: Secondary | ICD-10-CM | POA: Diagnosis not present

## 2023-02-05 DIAGNOSIS — W01198A Fall on same level from slipping, tripping and stumbling with subsequent striking against other object, initial encounter: Secondary | ICD-10-CM | POA: Diagnosis not present

## 2023-02-05 DIAGNOSIS — S50312A Abrasion of left elbow, initial encounter: Secondary | ICD-10-CM | POA: Diagnosis not present

## 2023-02-05 MED ORDER — HALOPERIDOL 1 MG PO TABS
2.0000 mg | ORAL_TABLET | Freq: Once | ORAL | Status: DC
  Filled 2023-02-05: qty 2

## 2023-02-05 MED ORDER — HALOPERIDOL LACTATE 5 MG/ML IJ SOLN: 2.0000 mg | Freq: Once | INTRAMUSCULAR | Status: DC

## 2023-02-05 MED ORDER — HALOPERIDOL LACTATE 5 MG/ML IJ SOLN
INTRAMUSCULAR | Status: AC
  Administered 2023-02-05: 2 mg
  Filled 2023-02-05: qty 1

## 2023-02-05 NOTE — ED Notes (Signed)
Ptar called pt is number 11 on the list

## 2023-02-05 NOTE — ED Notes (Addendum)
Patient refused PO haldol, slapping at me trying to give it to her. Per order, IM given, 2 mg since patient would not take PO. Patient slapping and clawing at me as I gave IM  injection.

## 2023-02-05 NOTE — Discharge Instructions (Addendum)
Imaging studies today did not show any major new injuries.  Continue home medications as prescribed.  Return to the emergency department for any new or worsening symptoms of concern.
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
## 2023-02-05 NOTE — ED Provider Notes (Signed)
Care of patient assumed from Dr. Rodena Medin.  This patient presents from nursing facility after a fall backwards.  She has a scalp hematoma.  She is awaiting CT scan results.  Discharge is anticipated. Physical Exam  BP (!) 179/80   Pulse 68   Temp 97.9 F (36.6 C)   Resp 16   SpO2 100%   Physical Exam Vitals and nursing note reviewed.  Constitutional:      General: She is not in acute distress.    Appearance: Normal appearance. She is well-developed. She is not ill-appearing, toxic-appearing or diaphoretic.  HENT:     Head: Normocephalic.     Right Ear: External ear normal.     Left Ear: External ear normal.     Nose: Nose normal.  Eyes:     Extraocular Movements: Extraocular movements intact.     Conjunctiva/sclera: Conjunctivae normal.  Cardiovascular:     Rate and Rhythm: Normal rate and regular rhythm.  Pulmonary:     Effort: Pulmonary effort is normal. No respiratory distress.  Abdominal:     General: There is no distension.     Palpations: Abdomen is soft.  Musculoskeletal:        General: No swelling or deformity. Normal range of motion.     Cervical back: Normal range of motion and neck supple.  Skin:    General: Skin is warm and dry.     Coloration: Skin is not jaundiced or pale.  Neurological:     General: No focal deficit present.     Mental Status: She is alert.  Psychiatric:        Mood and Affect: Mood is anxious.        Behavior: Behavior is agitated.     Procedures  Procedures  ED Course / MDM    Medical Decision Making Amount and/or Complexity of Data Reviewed Radiology: ordered.  Risk Prescription drug management.   CT imaging did not show acute findings other than the known scalp hematoma.  On assessment, patient is mildly agitated.  I suspect this is secondary to sundowning with her history of dementia.  Low-dose Haldol was ordered.  Patient is stable for discharge at this time.       Gloris Manchester, MD 02/05/23 [phone]

## 2023-02-05 NOTE — ED Notes (Signed)
Patient found to be wet, bedding and brief changed. Posey alarm activated.

## 2023-02-05 NOTE — ED Triage Notes (Signed)
Patient arrived from [facility] facility following mechanical fall. Patient has dementia and hospice patient. No thinners and hematoma to head. Patient at baseline per facility

## 2023-02-06 ENCOUNTER — Other Ambulatory Visit: Payer: Self-pay | Admitting: Internal Medicine

## 2023-04-09 ENCOUNTER — Emergency Department (HOSPITAL_COMMUNITY)

## 2023-04-09 ENCOUNTER — Other Ambulatory Visit: Payer: Self-pay

## 2023-04-09 ENCOUNTER — Emergency Department (HOSPITAL_COMMUNITY)
Admission: EM | Admit: 2023-04-09 | Discharge: 2023-04-09 | Disposition: A | Discharge status: Home / Self Care | Attending: Emergency Medicine | Admitting: Emergency Medicine

## 2023-04-09 DIAGNOSIS — W19XXXA Unspecified fall, initial encounter: Secondary | ICD-10-CM | POA: Diagnosis not present

## 2023-04-09 DIAGNOSIS — S0003XA Contusion of scalp, initial encounter: Secondary | ICD-10-CM | POA: Diagnosis not present

## 2023-04-09 DIAGNOSIS — Z7982 Long term (current) use of aspirin: Secondary | ICD-10-CM | POA: Diagnosis not present

## 2023-04-09 DIAGNOSIS — S0990XA Unspecified injury of head, initial encounter: Secondary | ICD-10-CM | POA: Diagnosis present

## 2023-04-09 DIAGNOSIS — Z79899 Other long term (current) drug therapy: Secondary | ICD-10-CM | POA: Diagnosis not present

## 2023-04-09 LAB — BASIC METABOLIC PANEL
Anion gap: 9 (ref 5–15)
BUN: 24 mg/dL — ABNORMAL HIGH (ref 8–23)
CO2: 25 mmol/L (ref 22–32)
Calcium: 9.3 mg/dL (ref 8.9–10.3)
Chloride: 105 mmol/L (ref 98–111)
Creatinine, Ser: 1.08 mg/dL — ABNORMAL HIGH (ref 0.44–1.00)
GFR, Estimated: 51 mL/min — ABNORMAL LOW (ref >60)
Glucose, Bld: 125 mg/dL — ABNORMAL HIGH (ref 70–99)
Potassium: 3.9 mmol/L (ref 3.5–5.1)
Sodium: 139 mmol/L (ref 135–145)

## 2023-04-09 LAB — CBC WITH DIFFERENTIAL/PLATELET
Abs Immature Granulocytes: 0.03 10*3/uL (ref 0.00–0.07)
Basophils Absolute: 0.1 10*3/uL (ref 0.0–0.1)
Basophils Relative: 1 %
Eosinophils Absolute: 0.6 10*3/uL — ABNORMAL HIGH (ref 0.0–0.5)
Eosinophils Relative: 7 %
HCT: 40.4 % (ref 36.0–46.0)
Hemoglobin: 12.9 g/dL (ref 12.0–15.0)
Immature Granulocytes: 0 %
Lymphocytes Relative: 22 %
Lymphs Abs: 2.1 10*3/uL (ref 0.7–4.0)
MCH: 29.7 pg (ref 26.0–34.0)
MCHC: 31.9 g/dL (ref 30.0–36.0)
MCV: 93.1 fL (ref 80.0–100.0)
Monocytes Absolute: 0.8 10*3/uL (ref 0.1–1.0)
Monocytes Relative: 8 %
Neutro Abs: 5.8 10*3/uL (ref 1.7–7.7)
Neutrophils Relative %: 62 %
Platelets: 270 10*3/uL (ref 150–400)
RBC: 4.34 MIL/uL (ref 3.87–5.11)
RDW: 13.4 % (ref 11.5–15.5)
WBC: 9.3 10*3/uL (ref 4.0–10.5)
nRBC: 0 % (ref 0.0–0.2)
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
## 2023-04-09 NOTE — ED Triage Notes (Signed)
Patient from [facility] after falling out of her wheelchair and hitting the back of her head. No blood thinners. Acting her baseline per facility staff.

## 2023-04-09 NOTE — Discharge Instructions (Signed)
Follow-up with your family doctor in the office.  Please return for sudden worsening headache confusion or vomiting.

## 2023-04-09 NOTE — ED Notes (Signed)
Attempted to call [facility] x3. I was redirected to another line but no one answered and the phone just continued ringing all three times.
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
## 2023-04-09 NOTE — ED Notes (Signed)
Ptar called [facility] within an hour

## 2023-05-21 ENCOUNTER — Other Ambulatory Visit: Payer: Self-pay | Admitting: Internal Medicine

## 2023-09-13 ENCOUNTER — Other Ambulatory Visit: Payer: Self-pay

## 2023-09-13 ENCOUNTER — Emergency Department (HOSPITAL_COMMUNITY)
Admission: EM | Admit: 2023-09-13 | Discharge: 2023-09-13 | Disposition: A | Discharge status: Skilled Nursing Facility | Attending: Emergency Medicine | Admitting: Emergency Medicine

## 2023-09-13 ENCOUNTER — Emergency Department (HOSPITAL_COMMUNITY)

## 2023-09-13 DIAGNOSIS — I10 Essential (primary) hypertension: Secondary | ICD-10-CM | POA: Diagnosis not present

## 2023-09-13 DIAGNOSIS — F0390 Unspecified dementia without behavioral disturbance: Secondary | ICD-10-CM | POA: Insufficient documentation

## 2023-09-13 DIAGNOSIS — W01198A Fall on same level from slipping, tripping and stumbling with subsequent striking against other object, initial encounter: Secondary | ICD-10-CM | POA: Diagnosis not present

## 2023-09-13 DIAGNOSIS — M542 Cervicalgia: Secondary | ICD-10-CM | POA: Diagnosis present

## 2023-09-13 DIAGNOSIS — E119 Type 2 diabetes mellitus without complications: Secondary | ICD-10-CM | POA: Insufficient documentation

## 2023-09-13 DIAGNOSIS — Z79899 Other long term (current) drug therapy: Secondary | ICD-10-CM | POA: Insufficient documentation

## 2023-09-13 DIAGNOSIS — Z7982 Long term (current) use of aspirin: Secondary | ICD-10-CM | POA: Insufficient documentation

## 2023-09-13 DIAGNOSIS — W19XXXA Unspecified fall, initial encounter: Secondary | ICD-10-CM

## 2023-09-13 LAB — BASIC METABOLIC PANEL WITH GFR
Anion gap: 6 (ref 5–15)
BUN: 22 mg/dL (ref 8–23)
CO2: 30 mmol/L (ref 22–32)
Calcium: 9.2 mg/dL (ref 8.9–10.3)
Chloride: 106 mmol/L (ref 98–111)
Creatinine, Ser: 0.87 mg/dL (ref 0.44–1.00)
GFR, Estimated: >60 mL/min (ref >60)
Glucose, Bld: 165 mg/dL — ABNORMAL HIGH (ref 70–99)
Potassium: 4.7 mmol/L (ref 3.5–5.1)
Sodium: 142 mmol/L (ref 135–145)

## 2023-09-13 LAB — CBC
HCT: 37.2 % (ref 36.0–46.0)
Hemoglobin: 11.8 g/dL — ABNORMAL LOW (ref 12.0–15.0)
MCH: 30 pg (ref 26.0–34.0)
MCHC: 31.7 g/dL (ref 30.0–36.0)
MCV: 94.7 fL (ref 80.0–100.0)
Platelets: 233 10*3/uL (ref 150–400)
RBC: 3.93 MIL/uL (ref 3.87–5.11)
RDW: 14.6 % (ref 11.5–15.5)
WBC: 10.3 10*3/uL (ref 4.0–10.5)
nRBC: 0 % (ref 0.0–0.2)

## 2023-09-13 LAB — TROPONIN I (HIGH SENSITIVITY)
Troponin I (High Sensitivity): 11 ng/L (ref <18)
Troponin I (High Sensitivity): 14 ng/L (ref <18)

## 2023-09-13 NOTE — Discharge Instructions (Addendum)
 There was a swelling behind her eye on the left side.  Will need a noncontrast MRI of the orbits sometime in the future to evaluate this.  No severe injury seen from the fall.
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
## 2023-09-13 NOTE — ED Notes (Signed)
 Awaiting patient from lobby.

## 2023-09-13 NOTE — ED Notes (Signed)
 Patient transported to CT

## 2023-09-13 NOTE — ED Triage Notes (Signed)
 EMS stated, she has a hematoma to her head. She has dementia and unable to communicate properly.
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
## 2023-09-15 ENCOUNTER — Encounter (HOSPITAL_COMMUNITY): Payer: Self-pay | Admitting: Emergency Medicine

## 2023-09-15 ENCOUNTER — Emergency Department (HOSPITAL_COMMUNITY)

## 2023-09-15 ENCOUNTER — Other Ambulatory Visit: Payer: Self-pay

## 2023-09-15 ENCOUNTER — Emergency Department (HOSPITAL_COMMUNITY)
Admission: EM | Admit: 2023-09-15 | Discharge: 2023-09-15 | Disposition: A | Discharge status: Skilled Nursing Facility | Attending: Emergency Medicine | Admitting: Emergency Medicine

## 2023-09-15 DIAGNOSIS — F0390 Unspecified dementia without behavioral disturbance: Secondary | ICD-10-CM | POA: Insufficient documentation

## 2023-09-15 DIAGNOSIS — S0990XA Unspecified injury of head, initial encounter: Secondary | ICD-10-CM | POA: Diagnosis present

## 2023-09-15 DIAGNOSIS — Z7982 Long term (current) use of aspirin: Secondary | ICD-10-CM | POA: Diagnosis not present

## 2023-09-15 DIAGNOSIS — W1839XA Other fall on same level, initial encounter: Secondary | ICD-10-CM | POA: Diagnosis not present

## 2023-09-15 DIAGNOSIS — S0101XA Laceration without foreign body of scalp, initial encounter: Secondary | ICD-10-CM | POA: Diagnosis not present
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
## 2023-09-15 NOTE — ED Notes (Signed)
 2 staples applied by Dr. Leighton Punches. Wound covered with guaze and wrapped around head

## 2023-09-15 NOTE — ED Notes (Signed)
 [facility] notified for transport
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
## 2023-09-15 NOTE — Discharge Instructions (Signed)
Staples out in 7 to 10 days 

## 2024-02-20 DEATH — deceased
